# Patient Record
Sex: Female | Born: 1945 | Race: Black or African American | Hispanic: No | Marital: Married | Smoking: Former smoker
Health system: Southern US, Community
[De-identification: ages and names within clinical notes are randomized; demographics above are authoritative.]

## PROBLEM LIST (undated history)

## (undated) DIAGNOSIS — E785 Hyperlipidemia, unspecified: Secondary | ICD-10-CM

## (undated) DIAGNOSIS — N189 Chronic kidney disease, unspecified: Secondary | ICD-10-CM

## (undated) DIAGNOSIS — R09A2 Foreign body sensation, throat: Secondary | ICD-10-CM

## (undated) DIAGNOSIS — R0600 Dyspnea, unspecified: Secondary | ICD-10-CM

## (undated) DIAGNOSIS — L309 Dermatitis, unspecified: Secondary | ICD-10-CM

## (undated) DIAGNOSIS — K219 Gastro-esophageal reflux disease without esophagitis: Secondary | ICD-10-CM

## (undated) DIAGNOSIS — R0602 Shortness of breath: Secondary | ICD-10-CM

## (undated) DIAGNOSIS — D3610 Benign neoplasm of peripheral nerves and autonomic nervous system, unspecified: Secondary | ICD-10-CM

## (undated) DIAGNOSIS — Z780 Asymptomatic menopausal state: Secondary | ICD-10-CM

## (undated) DIAGNOSIS — M810 Age-related osteoporosis without current pathological fracture: Secondary | ICD-10-CM

## (undated) DIAGNOSIS — I7300 Raynaud's syndrome without gangrene: Secondary | ICD-10-CM

## (undated) DIAGNOSIS — J45909 Unspecified asthma, uncomplicated: Secondary | ICD-10-CM

## (undated) DIAGNOSIS — J189 Pneumonia, unspecified organism: Secondary | ICD-10-CM

## (undated) DIAGNOSIS — R198 Other specified symptoms and signs involving the digestive system and abdomen: Secondary | ICD-10-CM

## (undated) DIAGNOSIS — D649 Anemia, unspecified: Secondary | ICD-10-CM

## (undated) DIAGNOSIS — M1990 Unspecified osteoarthritis, unspecified site: Secondary | ICD-10-CM

## (undated) DIAGNOSIS — T7840XA Allergy, unspecified, initial encounter: Secondary | ICD-10-CM

## (undated) DIAGNOSIS — J301 Allergic rhinitis due to pollen: Secondary | ICD-10-CM

## (undated) DIAGNOSIS — I10 Essential (primary) hypertension: Secondary | ICD-10-CM

## (undated) DIAGNOSIS — R0989 Other specified symptoms and signs involving the circulatory and respiratory systems: Secondary | ICD-10-CM

## (undated) DIAGNOSIS — G5600 Carpal tunnel syndrome, unspecified upper limb: Secondary | ICD-10-CM

## (undated) HISTORY — DX: Essential (primary) hypertension: I10

## (undated) HISTORY — DX: Allergy, unspecified, initial encounter: T78.40XA

## (undated) HISTORY — DX: Gastro-esophageal reflux disease without esophagitis: K21.9

## (undated) HISTORY — DX: Allergic rhinitis due to pollen: J30.1

## (undated) HISTORY — DX: Carpal tunnel syndrome, unspecified upper limb: G56.00

## (undated) HISTORY — DX: Other specified symptoms and signs involving the digestive system and abdomen: R19.8

## (undated) HISTORY — DX: Benign neoplasm of peripheral nerves and autonomic nervous system, unspecified: D36.10

## (undated) HISTORY — DX: Dermatitis, unspecified: L30.9

## (undated) HISTORY — DX: Age-related osteoporosis without current pathological fracture: M81.0

## (undated) HISTORY — PX: FOOT NEUROMA SURGERY: SHX646

## (undated) HISTORY — DX: Raynaud's syndrome without gangrene: I73.00

## (undated) HISTORY — DX: Asymptomatic menopausal state: Z78.0

## (undated) HISTORY — DX: Unspecified asthma, uncomplicated: J45.909

## (undated) HISTORY — PX: PARTIAL HYSTERECTOMY: SHX80

## (undated) HISTORY — DX: Shortness of breath: R06.02

## (undated) HISTORY — DX: Foreign body sensation, throat: R09.A2

## (undated) HISTORY — DX: Other specified symptoms and signs involving the circulatory and respiratory systems: R09.89

## (undated) HISTORY — DX: Hyperlipidemia, unspecified: E78.5

---

## 2007-11-02 ENCOUNTER — Emergency Department: Payer: Self-pay | Admitting: Emergency Medicine

## 2007-11-15 ENCOUNTER — Ambulatory Visit: Payer: Self-pay | Admitting: Internal Medicine

## 2008-01-01 ENCOUNTER — Ambulatory Visit: Payer: Self-pay | Admitting: Gastroenterology

## 2008-01-01 LAB — HM COLONOSCOPY

## 2008-03-03 ENCOUNTER — Ambulatory Visit: Payer: Self-pay | Admitting: Internal Medicine

## 2008-03-04 ENCOUNTER — Ambulatory Visit: Payer: Self-pay | Admitting: Internal Medicine

## 2008-03-16 ENCOUNTER — Ambulatory Visit: Payer: Self-pay | Admitting: Internal Medicine

## 2011-12-27 DIAGNOSIS — J301 Allergic rhinitis due to pollen: Secondary | ICD-10-CM | POA: Diagnosis not present

## 2012-01-03 ENCOUNTER — Ambulatory Visit (INDEPENDENT_AMBULATORY_CARE_PROVIDER_SITE_OTHER): Payer: BC Managed Care – PPO | Admitting: Ophthalmology

## 2012-01-03 DIAGNOSIS — H43819 Vitreous degeneration, unspecified eye: Secondary | ICD-10-CM

## 2012-01-03 DIAGNOSIS — H35039 Hypertensive retinopathy, unspecified eye: Secondary | ICD-10-CM

## 2012-01-03 DIAGNOSIS — I10 Essential (primary) hypertension: Secondary | ICD-10-CM

## 2012-01-03 DIAGNOSIS — H348392 Tributary (branch) retinal vein occlusion, unspecified eye, stable: Secondary | ICD-10-CM

## 2012-01-10 DIAGNOSIS — H348392 Tributary (branch) retinal vein occlusion, unspecified eye, stable: Secondary | ICD-10-CM | POA: Diagnosis not present

## 2012-02-15 DIAGNOSIS — J301 Allergic rhinitis due to pollen: Secondary | ICD-10-CM | POA: Diagnosis not present

## 2012-02-29 DIAGNOSIS — J301 Allergic rhinitis due to pollen: Secondary | ICD-10-CM | POA: Diagnosis not present

## 2012-03-04 DIAGNOSIS — J301 Allergic rhinitis due to pollen: Secondary | ICD-10-CM | POA: Diagnosis not present

## 2012-03-07 DIAGNOSIS — J301 Allergic rhinitis due to pollen: Secondary | ICD-10-CM | POA: Diagnosis not present

## 2012-03-11 DIAGNOSIS — J301 Allergic rhinitis due to pollen: Secondary | ICD-10-CM | POA: Diagnosis not present

## 2012-03-14 DIAGNOSIS — J301 Allergic rhinitis due to pollen: Secondary | ICD-10-CM | POA: Diagnosis not present

## 2012-04-28 ENCOUNTER — Emergency Department: Payer: Self-pay | Admitting: Emergency Medicine

## 2012-04-28 DIAGNOSIS — S139XXA Sprain of joints and ligaments of unspecified parts of neck, initial encounter: Secondary | ICD-10-CM | POA: Diagnosis not present

## 2012-05-18 DIAGNOSIS — M545 Low back pain: Secondary | ICD-10-CM | POA: Diagnosis not present

## 2012-05-18 DIAGNOSIS — IMO0001 Reserved for inherently not codable concepts without codable children: Secondary | ICD-10-CM | POA: Diagnosis not present

## 2012-05-18 DIAGNOSIS — M2560 Stiffness of unspecified joint, not elsewhere classified: Secondary | ICD-10-CM | POA: Diagnosis not present

## 2012-05-20 DIAGNOSIS — M545 Low back pain: Secondary | ICD-10-CM | POA: Diagnosis not present

## 2012-05-20 DIAGNOSIS — IMO0001 Reserved for inherently not codable concepts without codable children: Secondary | ICD-10-CM | POA: Diagnosis not present

## 2012-05-20 DIAGNOSIS — M2560 Stiffness of unspecified joint, not elsewhere classified: Secondary | ICD-10-CM | POA: Diagnosis not present

## 2012-06-10 DIAGNOSIS — R002 Palpitations: Secondary | ICD-10-CM | POA: Diagnosis not present

## 2012-06-13 DIAGNOSIS — J301 Allergic rhinitis due to pollen: Secondary | ICD-10-CM | POA: Diagnosis not present

## 2012-06-18 DIAGNOSIS — E785 Hyperlipidemia, unspecified: Secondary | ICD-10-CM | POA: Diagnosis not present

## 2012-06-18 DIAGNOSIS — I10 Essential (primary) hypertension: Secondary | ICD-10-CM | POA: Diagnosis not present

## 2012-06-24 DIAGNOSIS — E2839 Other primary ovarian failure: Secondary | ICD-10-CM | POA: Diagnosis not present

## 2012-06-24 DIAGNOSIS — H2510 Age-related nuclear cataract, unspecified eye: Secondary | ICD-10-CM | POA: Diagnosis not present

## 2012-06-24 DIAGNOSIS — N951 Menopausal and female climacteric states: Secondary | ICD-10-CM | POA: Diagnosis not present

## 2012-07-01 DIAGNOSIS — R9431 Abnormal electrocardiogram [ECG] [EKG]: Secondary | ICD-10-CM | POA: Diagnosis not present

## 2012-07-01 DIAGNOSIS — R002 Palpitations: Secondary | ICD-10-CM | POA: Diagnosis not present

## 2012-07-01 DIAGNOSIS — E785 Hyperlipidemia, unspecified: Secondary | ICD-10-CM | POA: Diagnosis not present

## 2012-07-01 DIAGNOSIS — I10 Essential (primary) hypertension: Secondary | ICD-10-CM | POA: Diagnosis not present

## 2012-07-15 DIAGNOSIS — E782 Mixed hyperlipidemia: Secondary | ICD-10-CM | POA: Diagnosis not present

## 2012-07-15 DIAGNOSIS — R0602 Shortness of breath: Secondary | ICD-10-CM | POA: Diagnosis not present

## 2012-07-15 DIAGNOSIS — I119 Hypertensive heart disease without heart failure: Secondary | ICD-10-CM | POA: Diagnosis not present

## 2012-07-15 DIAGNOSIS — I209 Angina pectoris, unspecified: Secondary | ICD-10-CM | POA: Diagnosis not present

## 2012-07-24 DIAGNOSIS — I10 Essential (primary) hypertension: Secondary | ICD-10-CM | POA: Diagnosis not present

## 2012-07-24 DIAGNOSIS — E7800 Pure hypercholesterolemia, unspecified: Secondary | ICD-10-CM | POA: Diagnosis not present

## 2012-07-24 DIAGNOSIS — E785 Hyperlipidemia, unspecified: Secondary | ICD-10-CM | POA: Diagnosis not present

## 2012-07-24 DIAGNOSIS — J45909 Unspecified asthma, uncomplicated: Secondary | ICD-10-CM | POA: Diagnosis not present

## 2012-08-12 DIAGNOSIS — H2510 Age-related nuclear cataract, unspecified eye: Secondary | ICD-10-CM | POA: Diagnosis not present

## 2012-08-14 DIAGNOSIS — J301 Allergic rhinitis due to pollen: Secondary | ICD-10-CM | POA: Diagnosis not present

## 2012-09-05 DIAGNOSIS — J301 Allergic rhinitis due to pollen: Secondary | ICD-10-CM | POA: Diagnosis not present

## 2012-09-05 DIAGNOSIS — E785 Hyperlipidemia, unspecified: Secondary | ICD-10-CM | POA: Diagnosis not present

## 2012-09-05 DIAGNOSIS — I10 Essential (primary) hypertension: Secondary | ICD-10-CM | POA: Diagnosis not present

## 2012-09-05 DIAGNOSIS — E559 Vitamin D deficiency, unspecified: Secondary | ICD-10-CM | POA: Diagnosis not present

## 2012-09-05 DIAGNOSIS — Z0000 Encounter for general adult medical examination without abnormal findings: Secondary | ICD-10-CM | POA: Diagnosis not present

## 2012-09-10 DIAGNOSIS — Z23 Encounter for immunization: Secondary | ICD-10-CM | POA: Diagnosis not present

## 2012-09-10 DIAGNOSIS — Z0000 Encounter for general adult medical examination without abnormal findings: Secondary | ICD-10-CM | POA: Diagnosis not present

## 2012-09-10 DIAGNOSIS — J45909 Unspecified asthma, uncomplicated: Secondary | ICD-10-CM | POA: Diagnosis not present

## 2012-09-10 DIAGNOSIS — J309 Allergic rhinitis, unspecified: Secondary | ICD-10-CM | POA: Diagnosis not present

## 2012-10-07 DIAGNOSIS — H2510 Age-related nuclear cataract, unspecified eye: Secondary | ICD-10-CM | POA: Diagnosis not present

## 2012-10-07 DIAGNOSIS — H269 Unspecified cataract: Secondary | ICD-10-CM | POA: Diagnosis not present

## 2012-10-08 DIAGNOSIS — Z961 Presence of intraocular lens: Secondary | ICD-10-CM | POA: Diagnosis not present

## 2012-10-08 DIAGNOSIS — H2510 Age-related nuclear cataract, unspecified eye: Secondary | ICD-10-CM | POA: Diagnosis not present

## 2012-10-28 DIAGNOSIS — H2510 Age-related nuclear cataract, unspecified eye: Secondary | ICD-10-CM | POA: Diagnosis not present

## 2012-10-28 DIAGNOSIS — H269 Unspecified cataract: Secondary | ICD-10-CM | POA: Diagnosis not present

## 2012-12-24 DIAGNOSIS — I10 Essential (primary) hypertension: Secondary | ICD-10-CM | POA: Diagnosis not present

## 2012-12-24 DIAGNOSIS — E785 Hyperlipidemia, unspecified: Secondary | ICD-10-CM | POA: Diagnosis not present

## 2012-12-24 DIAGNOSIS — R002 Palpitations: Secondary | ICD-10-CM | POA: Diagnosis not present

## 2012-12-24 DIAGNOSIS — I059 Rheumatic mitral valve disease, unspecified: Secondary | ICD-10-CM | POA: Diagnosis not present

## 2012-12-25 ENCOUNTER — Encounter (INDEPENDENT_AMBULATORY_CARE_PROVIDER_SITE_OTHER): Payer: Medicare Other | Admitting: Ophthalmology

## 2012-12-25 DIAGNOSIS — H35039 Hypertensive retinopathy, unspecified eye: Secondary | ICD-10-CM | POA: Diagnosis not present

## 2012-12-25 DIAGNOSIS — H348392 Tributary (branch) retinal vein occlusion, unspecified eye, stable: Secondary | ICD-10-CM | POA: Diagnosis not present

## 2012-12-25 DIAGNOSIS — H43819 Vitreous degeneration, unspecified eye: Secondary | ICD-10-CM

## 2012-12-25 DIAGNOSIS — I10 Essential (primary) hypertension: Secondary | ICD-10-CM

## 2012-12-25 DIAGNOSIS — H35359 Cystoid macular degeneration, unspecified eye: Secondary | ICD-10-CM | POA: Diagnosis not present

## 2013-01-02 ENCOUNTER — Ambulatory Visit (INDEPENDENT_AMBULATORY_CARE_PROVIDER_SITE_OTHER): Payer: BC Managed Care – PPO | Admitting: Ophthalmology

## 2013-02-05 ENCOUNTER — Encounter (INDEPENDENT_AMBULATORY_CARE_PROVIDER_SITE_OTHER): Payer: Medicare Other | Admitting: Ophthalmology

## 2013-02-05 DIAGNOSIS — H2510 Age-related nuclear cataract, unspecified eye: Secondary | ICD-10-CM

## 2013-02-05 DIAGNOSIS — H43819 Vitreous degeneration, unspecified eye: Secondary | ICD-10-CM

## 2013-02-05 DIAGNOSIS — H348392 Tributary (branch) retinal vein occlusion, unspecified eye, stable: Secondary | ICD-10-CM

## 2013-02-05 DIAGNOSIS — I10 Essential (primary) hypertension: Secondary | ICD-10-CM

## 2013-02-05 DIAGNOSIS — H35039 Hypertensive retinopathy, unspecified eye: Secondary | ICD-10-CM

## 2013-08-05 ENCOUNTER — Ambulatory Visit (INDEPENDENT_AMBULATORY_CARE_PROVIDER_SITE_OTHER): Payer: Medicare Other | Admitting: Ophthalmology

## 2013-08-11 DIAGNOSIS — I10 Essential (primary) hypertension: Secondary | ICD-10-CM | POA: Diagnosis not present

## 2013-08-11 DIAGNOSIS — J019 Acute sinusitis, unspecified: Secondary | ICD-10-CM | POA: Diagnosis not present

## 2013-08-11 DIAGNOSIS — N951 Menopausal and female climacteric states: Secondary | ICD-10-CM | POA: Diagnosis not present

## 2013-08-27 DIAGNOSIS — E785 Hyperlipidemia, unspecified: Secondary | ICD-10-CM | POA: Diagnosis not present

## 2013-08-27 DIAGNOSIS — I10 Essential (primary) hypertension: Secondary | ICD-10-CM | POA: Diagnosis not present

## 2013-08-27 DIAGNOSIS — R9431 Abnormal electrocardiogram [ECG] [EKG]: Secondary | ICD-10-CM | POA: Diagnosis not present

## 2013-09-17 DIAGNOSIS — Z0000 Encounter for general adult medical examination without abnormal findings: Secondary | ICD-10-CM | POA: Diagnosis not present

## 2013-09-17 DIAGNOSIS — I10 Essential (primary) hypertension: Secondary | ICD-10-CM | POA: Diagnosis not present

## 2013-09-17 DIAGNOSIS — E785 Hyperlipidemia, unspecified: Secondary | ICD-10-CM | POA: Diagnosis not present

## 2013-09-23 DIAGNOSIS — I10 Essential (primary) hypertension: Secondary | ICD-10-CM | POA: Diagnosis not present

## 2013-09-23 DIAGNOSIS — N951 Menopausal and female climacteric states: Secondary | ICD-10-CM | POA: Diagnosis not present

## 2013-09-23 DIAGNOSIS — Z0000 Encounter for general adult medical examination without abnormal findings: Secondary | ICD-10-CM | POA: Diagnosis not present

## 2013-09-25 DIAGNOSIS — Z1231 Encounter for screening mammogram for malignant neoplasm of breast: Secondary | ICD-10-CM | POA: Diagnosis not present

## 2013-10-27 DIAGNOSIS — J45909 Unspecified asthma, uncomplicated: Secondary | ICD-10-CM | POA: Diagnosis not present

## 2013-10-27 DIAGNOSIS — I10 Essential (primary) hypertension: Secondary | ICD-10-CM | POA: Diagnosis not present

## 2013-10-27 DIAGNOSIS — R0602 Shortness of breath: Secondary | ICD-10-CM | POA: Diagnosis not present

## 2013-11-24 ENCOUNTER — Ambulatory Visit: Payer: Self-pay

## 2013-11-24 DIAGNOSIS — I10 Essential (primary) hypertension: Secondary | ICD-10-CM | POA: Diagnosis not present

## 2013-11-24 DIAGNOSIS — J984 Other disorders of lung: Secondary | ICD-10-CM | POA: Diagnosis not present

## 2013-11-24 DIAGNOSIS — J45909 Unspecified asthma, uncomplicated: Secondary | ICD-10-CM | POA: Diagnosis not present

## 2013-11-24 DIAGNOSIS — R911 Solitary pulmonary nodule: Secondary | ICD-10-CM | POA: Diagnosis not present

## 2013-11-24 DIAGNOSIS — R0602 Shortness of breath: Secondary | ICD-10-CM | POA: Diagnosis not present

## 2013-12-18 HISTORY — PX: EYE SURGERY: SHX253

## 2014-03-09 DIAGNOSIS — J45909 Unspecified asthma, uncomplicated: Secondary | ICD-10-CM | POA: Diagnosis not present

## 2014-03-09 DIAGNOSIS — E785 Hyperlipidemia, unspecified: Secondary | ICD-10-CM | POA: Diagnosis not present

## 2014-03-09 DIAGNOSIS — N951 Menopausal and female climacteric states: Secondary | ICD-10-CM | POA: Diagnosis not present

## 2014-03-09 DIAGNOSIS — J309 Allergic rhinitis, unspecified: Secondary | ICD-10-CM | POA: Diagnosis not present

## 2014-03-09 DIAGNOSIS — I10 Essential (primary) hypertension: Secondary | ICD-10-CM | POA: Diagnosis not present

## 2014-03-12 DIAGNOSIS — J301 Allergic rhinitis due to pollen: Secondary | ICD-10-CM | POA: Diagnosis not present

## 2014-03-12 DIAGNOSIS — J0100 Acute maxillary sinusitis, unspecified: Secondary | ICD-10-CM | POA: Diagnosis not present

## 2014-04-13 DIAGNOSIS — L258 Unspecified contact dermatitis due to other agents: Secondary | ICD-10-CM | POA: Diagnosis not present

## 2014-04-13 DIAGNOSIS — I10 Essential (primary) hypertension: Secondary | ICD-10-CM | POA: Diagnosis not present

## 2014-08-20 DIAGNOSIS — H348392 Tributary (branch) retinal vein occlusion, unspecified eye, stable: Secondary | ICD-10-CM | POA: Diagnosis not present

## 2014-09-01 DIAGNOSIS — I10 Essential (primary) hypertension: Secondary | ICD-10-CM | POA: Diagnosis not present

## 2014-09-01 DIAGNOSIS — R0602 Shortness of breath: Secondary | ICD-10-CM | POA: Diagnosis not present

## 2014-09-01 DIAGNOSIS — E782 Mixed hyperlipidemia: Secondary | ICD-10-CM | POA: Diagnosis not present

## 2014-09-01 DIAGNOSIS — R0681 Apnea, not elsewhere classified: Secondary | ICD-10-CM | POA: Insufficient documentation

## 2014-09-14 DIAGNOSIS — E782 Mixed hyperlipidemia: Secondary | ICD-10-CM | POA: Diagnosis not present

## 2014-09-14 DIAGNOSIS — R0602 Shortness of breath: Secondary | ICD-10-CM | POA: Diagnosis not present

## 2014-09-14 DIAGNOSIS — R002 Palpitations: Secondary | ICD-10-CM | POA: Diagnosis not present

## 2014-09-14 DIAGNOSIS — I10 Essential (primary) hypertension: Secondary | ICD-10-CM | POA: Diagnosis not present

## 2014-09-15 DIAGNOSIS — R109 Unspecified abdominal pain: Secondary | ICD-10-CM | POA: Diagnosis not present

## 2014-09-15 DIAGNOSIS — K219 Gastro-esophageal reflux disease without esophagitis: Secondary | ICD-10-CM | POA: Diagnosis not present

## 2014-09-15 DIAGNOSIS — Z23 Encounter for immunization: Secondary | ICD-10-CM | POA: Diagnosis not present

## 2014-10-16 DIAGNOSIS — R42 Dizziness and giddiness: Secondary | ICD-10-CM | POA: Diagnosis not present

## 2014-10-27 DIAGNOSIS — M7071 Other bursitis of hip, right hip: Secondary | ICD-10-CM | POA: Diagnosis not present

## 2014-10-27 DIAGNOSIS — I10 Essential (primary) hypertension: Secondary | ICD-10-CM | POA: Diagnosis not present

## 2014-10-27 DIAGNOSIS — K30 Functional dyspepsia: Secondary | ICD-10-CM | POA: Diagnosis not present

## 2014-10-27 DIAGNOSIS — E780 Pure hypercholesterolemia: Secondary | ICD-10-CM | POA: Diagnosis not present

## 2014-10-27 DIAGNOSIS — Z0000 Encounter for general adult medical examination without abnormal findings: Secondary | ICD-10-CM | POA: Diagnosis not present

## 2014-10-27 DIAGNOSIS — J45909 Unspecified asthma, uncomplicated: Secondary | ICD-10-CM | POA: Diagnosis not present

## 2014-10-27 LAB — HM PAP SMEAR

## 2014-10-28 DIAGNOSIS — M7071 Other bursitis of hip, right hip: Secondary | ICD-10-CM | POA: Diagnosis not present

## 2014-11-23 ENCOUNTER — Ambulatory Visit: Payer: Self-pay | Admitting: Gastroenterology

## 2014-11-23 DIAGNOSIS — Z1211 Encounter for screening for malignant neoplasm of colon: Secondary | ICD-10-CM | POA: Diagnosis not present

## 2014-11-23 DIAGNOSIS — K219 Gastro-esophageal reflux disease without esophagitis: Secondary | ICD-10-CM | POA: Diagnosis not present

## 2014-11-23 DIAGNOSIS — M25551 Pain in right hip: Secondary | ICD-10-CM | POA: Diagnosis not present

## 2014-11-23 DIAGNOSIS — Z87891 Personal history of nicotine dependence: Secondary | ICD-10-CM | POA: Diagnosis not present

## 2014-11-23 DIAGNOSIS — I10 Essential (primary) hypertension: Secondary | ICD-10-CM | POA: Diagnosis not present

## 2014-11-23 DIAGNOSIS — Z7982 Long term (current) use of aspirin: Secondary | ICD-10-CM | POA: Diagnosis not present

## 2014-11-23 DIAGNOSIS — Z79899 Other long term (current) drug therapy: Secondary | ICD-10-CM | POA: Diagnosis not present

## 2014-11-23 DIAGNOSIS — I7300 Raynaud's syndrome without gangrene: Secondary | ICD-10-CM | POA: Diagnosis not present

## 2014-11-23 DIAGNOSIS — M1990 Unspecified osteoarthritis, unspecified site: Secondary | ICD-10-CM | POA: Diagnosis not present

## 2014-11-23 DIAGNOSIS — E785 Hyperlipidemia, unspecified: Secondary | ICD-10-CM | POA: Diagnosis not present

## 2014-11-23 DIAGNOSIS — L309 Dermatitis, unspecified: Secondary | ICD-10-CM | POA: Diagnosis not present

## 2014-12-31 DIAGNOSIS — Z1231 Encounter for screening mammogram for malignant neoplasm of breast: Secondary | ICD-10-CM | POA: Diagnosis not present

## 2014-12-31 LAB — HM MAMMOGRAPHY

## 2015-01-22 DIAGNOSIS — M79675 Pain in left toe(s): Secondary | ICD-10-CM | POA: Diagnosis not present

## 2015-03-04 DIAGNOSIS — R1013 Epigastric pain: Secondary | ICD-10-CM | POA: Diagnosis not present

## 2015-03-26 ENCOUNTER — Ambulatory Visit
Admit: 2015-03-26 | Disposition: A | Discharge status: Home / Self Care | Payer: Self-pay | Attending: Gastroenterology | Admitting: Gastroenterology

## 2015-03-26 DIAGNOSIS — K219 Gastro-esophageal reflux disease without esophagitis: Secondary | ICD-10-CM | POA: Diagnosis not present

## 2015-03-26 DIAGNOSIS — I7300 Raynaud's syndrome without gangrene: Secondary | ICD-10-CM | POA: Diagnosis not present

## 2015-03-26 DIAGNOSIS — Z888 Allergy status to other drugs, medicaments and biological substances status: Secondary | ICD-10-CM | POA: Diagnosis not present

## 2015-03-26 DIAGNOSIS — Z91018 Allergy to other foods: Secondary | ICD-10-CM | POA: Diagnosis not present

## 2015-03-26 DIAGNOSIS — K2950 Unspecified chronic gastritis without bleeding: Secondary | ICD-10-CM | POA: Diagnosis not present

## 2015-03-26 DIAGNOSIS — R05 Cough: Secondary | ICD-10-CM | POA: Diagnosis not present

## 2015-03-26 DIAGNOSIS — E785 Hyperlipidemia, unspecified: Secondary | ICD-10-CM | POA: Diagnosis not present

## 2015-03-26 DIAGNOSIS — I10 Essential (primary) hypertension: Secondary | ICD-10-CM | POA: Diagnosis not present

## 2015-03-26 DIAGNOSIS — G5600 Carpal tunnel syndrome, unspecified upper limb: Secondary | ICD-10-CM | POA: Diagnosis not present

## 2015-03-26 DIAGNOSIS — R1013 Epigastric pain: Secondary | ICD-10-CM | POA: Diagnosis not present

## 2015-03-26 DIAGNOSIS — Z91048 Other nonmedicinal substance allergy status: Secondary | ICD-10-CM | POA: Diagnosis not present

## 2015-03-26 DIAGNOSIS — Z7982 Long term (current) use of aspirin: Secondary | ICD-10-CM | POA: Diagnosis not present

## 2015-03-26 DIAGNOSIS — K2970 Gastritis, unspecified, without bleeding: Secondary | ICD-10-CM | POA: Diagnosis not present

## 2015-03-26 DIAGNOSIS — R12 Heartburn: Secondary | ICD-10-CM | POA: Diagnosis not present

## 2015-03-26 DIAGNOSIS — L309 Dermatitis, unspecified: Secondary | ICD-10-CM | POA: Diagnosis not present

## 2015-03-26 DIAGNOSIS — M8580 Other specified disorders of bone density and structure, unspecified site: Secondary | ICD-10-CM | POA: Diagnosis not present

## 2015-03-26 DIAGNOSIS — J45909 Unspecified asthma, uncomplicated: Secondary | ICD-10-CM | POA: Diagnosis not present

## 2015-03-26 DIAGNOSIS — Z7951 Long term (current) use of inhaled steroids: Secondary | ICD-10-CM | POA: Diagnosis not present

## 2015-03-26 DIAGNOSIS — Z79899 Other long term (current) drug therapy: Secondary | ICD-10-CM | POA: Diagnosis not present

## 2015-03-26 DIAGNOSIS — Z87891 Personal history of nicotine dependence: Secondary | ICD-10-CM | POA: Diagnosis not present

## 2015-03-26 DIAGNOSIS — M25551 Pain in right hip: Secondary | ICD-10-CM | POA: Diagnosis not present

## 2015-04-08 DIAGNOSIS — K30 Functional dyspepsia: Secondary | ICD-10-CM | POA: Diagnosis not present

## 2015-04-20 DIAGNOSIS — R002 Palpitations: Secondary | ICD-10-CM | POA: Diagnosis not present

## 2015-04-20 DIAGNOSIS — E782 Mixed hyperlipidemia: Secondary | ICD-10-CM | POA: Diagnosis not present

## 2015-04-20 DIAGNOSIS — I10 Essential (primary) hypertension: Secondary | ICD-10-CM | POA: Diagnosis not present

## 2015-04-21 DIAGNOSIS — R1310 Dysphagia, unspecified: Secondary | ICD-10-CM | POA: Diagnosis not present

## 2015-04-21 DIAGNOSIS — J387 Other diseases of larynx: Secondary | ICD-10-CM | POA: Diagnosis not present

## 2015-04-21 DIAGNOSIS — J301 Allergic rhinitis due to pollen: Secondary | ICD-10-CM | POA: Diagnosis not present

## 2015-04-21 DIAGNOSIS — R0602 Shortness of breath: Secondary | ICD-10-CM | POA: Diagnosis not present

## 2015-04-21 DIAGNOSIS — R05 Cough: Secondary | ICD-10-CM | POA: Diagnosis not present

## 2015-04-27 DIAGNOSIS — J301 Allergic rhinitis due to pollen: Secondary | ICD-10-CM | POA: Diagnosis not present

## 2015-05-14 DIAGNOSIS — J301 Allergic rhinitis due to pollen: Secondary | ICD-10-CM | POA: Diagnosis not present

## 2015-05-20 DIAGNOSIS — J301 Allergic rhinitis due to pollen: Secondary | ICD-10-CM | POA: Diagnosis not present

## 2015-05-24 ENCOUNTER — Institutional Professional Consult (permissible substitution): Payer: Self-pay | Admitting: Internal Medicine

## 2015-05-24 DIAGNOSIS — J301 Allergic rhinitis due to pollen: Secondary | ICD-10-CM | POA: Diagnosis not present

## 2015-05-27 DIAGNOSIS — J301 Allergic rhinitis due to pollen: Secondary | ICD-10-CM | POA: Diagnosis not present

## 2015-05-31 DIAGNOSIS — J301 Allergic rhinitis due to pollen: Secondary | ICD-10-CM | POA: Diagnosis not present

## 2015-06-03 DIAGNOSIS — J301 Allergic rhinitis due to pollen: Secondary | ICD-10-CM | POA: Diagnosis not present

## 2015-06-04 DIAGNOSIS — J301 Allergic rhinitis due to pollen: Secondary | ICD-10-CM | POA: Diagnosis not present

## 2015-06-07 ENCOUNTER — Other Ambulatory Visit: Payer: Self-pay

## 2015-06-07 ENCOUNTER — Telehealth: Payer: Self-pay

## 2015-06-07 DIAGNOSIS — A048 Other specified bacterial intestinal infections: Secondary | ICD-10-CM

## 2015-06-07 DIAGNOSIS — J301 Allergic rhinitis due to pollen: Secondary | ICD-10-CM | POA: Diagnosis not present

## 2015-06-07 NOTE — Telephone Encounter (Signed)
Called pt and advised I have mailed her a lab order to be retested for H. Pylori. Pt completed 10 day course of antibiotics.

## 2015-06-07 NOTE — Telephone Encounter (Signed)
-----   Message from Otilio Jefferson sent at 06/07/2015 10:59 AM EDT ----- Regarding: Re: Result Follow-up Sent: 06/02/2015  Call pt to remind its time to repeat H pylori test. GF  > From: Caelum Federici > To: Tanny Harnack > Sent: 04/20/2015 4:13 PM > Pt notified of results. Rx faxed to [facility]. GF  > From: [name] MD > To: Kristi Norment > Sent: 04/12/2015 10:25 AM > Let the pt know the biopsies were positive for H. pylori. The patient needs Pylera for 10 days if no allergies to the contents. Check stool H. pylori in 6 weeks after treatment.

## 2015-06-10 ENCOUNTER — Encounter: Payer: Self-pay | Admitting: Internal Medicine

## 2015-06-10 ENCOUNTER — Institutional Professional Consult (permissible substitution): Payer: Medicare Other | Admitting: Internal Medicine

## 2015-06-10 DIAGNOSIS — J301 Allergic rhinitis due to pollen: Secondary | ICD-10-CM | POA: Diagnosis not present

## 2015-06-14 ENCOUNTER — Ambulatory Visit (INDEPENDENT_AMBULATORY_CARE_PROVIDER_SITE_OTHER): Payer: Medicare Other | Admitting: Internal Medicine

## 2015-06-14 ENCOUNTER — Encounter: Payer: Self-pay | Admitting: Internal Medicine

## 2015-06-14 VITALS — BP 110/66 | HR 90 | Temp 98.4°F | Ht 62.5 in | Wt 148.0 lb

## 2015-06-14 DIAGNOSIS — J4530 Mild persistent asthma, uncomplicated: Secondary | ICD-10-CM

## 2015-06-14 DIAGNOSIS — J301 Allergic rhinitis due to pollen: Secondary | ICD-10-CM | POA: Diagnosis not present

## 2015-06-14 DIAGNOSIS — K219 Gastro-esophageal reflux disease without esophagitis: Secondary | ICD-10-CM | POA: Diagnosis not present

## 2015-06-14 DIAGNOSIS — R0609 Other forms of dyspnea: Secondary | ICD-10-CM | POA: Diagnosis not present

## 2015-06-14 DIAGNOSIS — R05 Cough: Secondary | ICD-10-CM | POA: Diagnosis not present

## 2015-06-14 DIAGNOSIS — R059 Cough, unspecified: Secondary | ICD-10-CM

## 2015-06-14 NOTE — Assessment & Plan Note (Addendum)
Patient clinically does have symptoms of extrinsic asthma, elicit worse with triggers such as perfumes, pollen, dust. Continue current allergy shots and allergy regiment area Will plan for pulmonary function testing, and 6 minute walk testing. Continue broncho-dilator/beta agonist combination therapy at this time. Advair 500/50. Review of imaging showed patient had a chest x-ray in 2014, there is a questionable artifact versus density in the right middle lobe. Will repeat 2 view chest x-ray, if density still there will consider further evaluation with CT scan.

## 2015-06-14 NOTE — Progress Notes (Signed)
Date: 06/14/2015  MRN# [id number] Nancy Mack [birth year]  Referring Physician: ENT - Dr. Richardson Landry PMD - Dr. Dellie Catholic Nancy Mack is a 69 y.o. old female seen in consultation for cough and dyspnea on exertion.   CC:  Chief Complaint  Patient presents with  . Advice Only    Pt referred by ENT Dr. Richardson Landry: Pt c/o sob, chest tightness, wheezing and cough with mucus ranging from clear thick to thin. This has been going on for several years.    HPI:  Patient is a pleasant 69 year old female presenting today for evaluation of shortness of breath along with chronic cough. Patient was referred from ENT. Patient states her cough has been going on for the past 5-6 years worse at night associated with heat and mostly in the summer with intermittent periods of clear sputum. However, this is becoming worse in the past year especially with laying flat. Cough is described a cyclical can last 1-2 minutes, about 10-15 times per day. Again cough is associated with shortness of breath and also dyspnea on exertion, especially climbing 1 flight of stairs. Other factors that may cough worse include inspiration, laying flat, perfumes, singing at times. Patient is currently being followed ENT for allergies, for the past 6 years, receiving allergy shots 2 times per week. Over the past month she is to use her albuterol inhaler about 3-4 times per month. Social history significant for about 7 years of tobacco use, 2-36 per day during that time. Patient also had significant secondhand smoke exposure from her husband for the past 37 years. No pets. Previously worked in indoor, doing office work. Off note, patient has had significant GI workup with upper endoscopy with biopsy showing possible positive H. pylori, she was placed on triple anabiotic therapy along with PPI for the past 2 weeks, currently she is only on a PPI at night. She also had a cardiology workup in 2015, for palpitations, she had a negative  stress test and a negative echo. Patient previously lived in [place] in the mid to late 80s and during that time started having shortness of breath was diagnosed with asthma. Since following with ENT she is also been placed on Advair, initially started off at low dose and gradually increased to max dose.  Patient states she does get relief of her shortness of breath and coughing at times with Advair.   PMHX:   Past Medical History  Diagnosis Date  . Asthma   . Rhinitis due to pollen   . Laryngopharyngeal reflux   . Globus pharyngeus   . SOB (shortness of breath)    Surgical Hx:  Past Surgical History  Procedure Laterality Date  . Partial hysterectomy     Family Hx:  Family History  Problem Relation Age of Onset  . Arthritis Mother   . Heart failure Mother   . Hypertension Mother   . Asthma Father   . Aneurysm Sister    Social Hx:   History  Substance Use Topics  . Smoking status: Former Smoker -- 2.00 packs/day for 2 years    Types: Cigarettes  . Smokeless tobacco: Never Used  . Alcohol Use: 0.0 oz/week    0 Standard drinks or equivalent per week     Comment: < 1 DRINK PER DAY   Medication:   Current Outpatient Rx  Name  Route  Sig  Dispense  Refill  . aspirin 81 MG tablet   Oral   Take 81 mg by mouth daily.         Marland Kitchen  atorvastatin (LIPITOR) 10 MG tablet   Oral   Take 10 mg by mouth daily.         Marland Kitchen estradiol (ESTRACE) 0.5 MG tablet   Oral   Take 0.5 mg by mouth daily.         . fexofenadine (ALLEGRA) 180 MG tablet   Oral   Take 180 mg by mouth daily.         . fluticasone (FLONASE) 50 MCG/ACT nasal spray   Each Nare   Place 2 sprays into both nostrils daily.         . Fluticasone-Salmeterol (ADVAIR) 500-50 MCG/DOSE AEPB   Inhalation   Inhale 1 puff into the lungs 2 (two) times daily.         . hydrochlorothiazide (HYDRODIURIL) 12.5 MG tablet   Oral   Take 12.5 mg by mouth daily.         . montelukast (SINGULAIR) 10 MG tablet    Oral   Take 10 mg by mouth at bedtime.             Allergies:  Prednisone  Review of Systems: Gen:  Denies  fever, sweats, chills HEENT: Denies blurred vision, double vision, ear pain, eye pain, hearing loss, nose bleeds, sore throat Cvc:  No dizziness, chest pain or heaviness Resp:   Cough, acid reflux, shortness of breath, dyspnea on exertion Gi: Denies swallowing difficulty, stomach pain, nausea or vomiting, diarrhea, constipation, bowel incontinence Gu:  Denies bladder incontinence, burning urine Ext:   No Joint pain, stiffness or swelling Skin: No skin rash, easy bruising or bleeding or hives Endoc:  No polyuria, polydipsia , polyphagia or weight change Psych: No depression, insomnia or hallucinations  Other:  All other systems negative  Physical Examination:   VS: BP 110/66 mmHg  Pulse 90  Temp(Src) 98.4 F (36.9 C) (Oral)  Ht 5' 2.5" (1.588 m)  Wt 148 lb (67.132 kg)  BMI 26.62 kg/m2  SpO2 95%  General Appearance: No distress  Neuro:without focal findings, mental status, speech normal, alert and oriented, cranial nerves 2-12 intact, reflexes normal and symmetric, sensation grossly normal  HEENT: PERRLA, EOM intact, no ptosis, no other lesions noticed; Mallampati 1 Pulmonary: Good inspiratory effort, slight expiratory wheeze, noted on deep inspiration to elicit cough. CardiovascularNormal S1,S2.  No m/r/g.  Abdominal aorta pulsation normal.    Abdomen: Benign, Soft, non-tender, No masses, hepatosplenomegaly, No lymphadenopathy Renal:  No costovertebral tenderness  GU:  No performed at this time. Endoc: No evident thyromegaly, no signs of acromegaly or Cushing features Skin:   warm, no rashes, no ecchymosis  Extremities: normal, no cyanosis, clubbing, no edema, warm with normal capillary refill. Other findings: None   Labs results:   Rad results: (The following images and results were reviewed by Dr. Stevenson Clinch). CXR 2014 EXAM:  [id number] UN 10/27/13   11:05:[id number] [facility]) : XR CHEST PA AND LATERAL  DICTATED: 11/06/13 15:23:51 INTERPRETATION LOCATION:  [place]  CLINICAL INDICATION: 69 Year Old (F) with history of: 786.05 - Shortness of breath.  TECHNIQUE: Two view chest x-ray   COMPARISON: None   FINDINGS: The trachea and cardiomediastinal silhouette are unremarkable.  No pleural effusion, focal consolidation or pneumothorax identified. A rounded density overlying the right hilar region may represent summation of shadows, although it is  questionably present on the lateral projection. The possibility of an underlying mass is not excluded. Lungs are otherwise clear. The osseous structures appear intact.     IMPRESSION:  Rounded density in  the right midlung may be artifactual in nature, however the possibility of an underlying mass is not excluded. Recommend comparison with prior outside imaging if available. If patient is high risk, consider cross-sectional imaging for  further evaluation.     Stress Test 09/14/14 [facility] - Dr. Nehemiah Massed INTERPRETATION Normal Stress Echocardiogram NORMAL STRESS ECHOCARDIOGRAM    Assessment and Plan: 69 year old female seen in consultation for dyspnea on exertion, and chronic cough. Extrinsic asthma Patient clinically does have symptoms of extrinsic asthma, elicit worse with triggers such as perfumes, pollen, dust. Continue current allergy shots and allergy regiment area Will plan for pulmonary function testing, and 6 minute walk testing. Continue broncho-dilator/beta agonist combination therapy at this time. Advair 500/50. Review of imaging showed patient had a chest x-ray in 2014, there is a questionable artifact versus density in the right middle lobe. Will repeat 2 view chest x-ray, if density still there will consider further evaluation with CT scan.  Cough The standardized cough guidelines published in Chest by Lissa Morales in 2006 are still the best available and  consist of a multiple step process (up to 12!) , not a single office visit,  and are intended  to address this problem logically,  with an alogrithm dependent on response to empiric treatment at  each progressive step  to determine a specific diagnosis with  minimal addtional testing needed. Therefore if adherence is an issue or can't be accurately verified,  it's very unlikely the standard evaluation and treatment will be successful here.    Furthermore, response to therapy (other than acute cough suppression, which should only be used short term with avoidance of narcotic containing cough syrups if possible), can be a gradual process for which the patient may perceive immediate benefit.   At this time believe patient's cough is multifactorial: Acid reflux disease, asthma, allergies.  Plan: -Optimize allergy regiment, continue with allergy shots, nasal steroids, allergy pills. -We'll further evaluate asthma with primary function testing. Continue with Advair 500/50 -Continue with GI evaluation and workup for acid reflux disease, continue with PPI.  GERD (gastroesophageal reflux disease) Per patient, had upper endoscopy and biopsy showing positive H. pylori. Patient completed Triple Antibiotic therapy along with PPI therapy. Currently only requiring PPI therapy, GI is following and we'll further evaluate and check for clearance with stool sample.    Updated Medication List Outpatient Encounter Prescriptions as of 06/14/2015  Medication Sig  . aspirin 81 MG tablet Take 81 mg by mouth daily.  Marland Kitchen atorvastatin (LIPITOR) 10 MG tablet Take 10 mg by mouth daily.  Marland Kitchen estradiol (ESTRACE) 0.5 MG tablet Take 0.5 mg by mouth daily.  . fexofenadine (ALLEGRA) 180 MG tablet Take 180 mg by mouth daily.  . fluticasone (FLONASE) 50 MCG/ACT nasal spray Place 2 sprays into both nostrils daily.  . Fluticasone-Salmeterol (ADVAIR) 500-50 MCG/DOSE AEPB Inhale 1 puff into the lungs 2 (two) times daily.  .  hydrochlorothiazide (HYDRODIURIL) 12.5 MG tablet Take 12.5 mg by mouth daily.  . montelukast (SINGULAIR) 10 MG tablet Take 10 mg by mouth at bedtime.  . [DISCONTINUED] Transdermal Patch PTCH Apply 1 patch topically daily as needed.   No facility-administered encounter medications on file as of 06/14/2015.    Orders for this visit: Orders Placed This Encounter  Procedures  . DG Chest 2 View    Standing Status: Future     Number of Occurrences:      Standing Expiration Date: 08/13/2016    Order Specific Question:  Reason for Exam (  SYMPTOM  OR DIAGNOSIS REQUIRED)    Answer:  cough, asthma, dyspnea    Order Specific Question:  Preferred imaging location?    Answer:  [facility]  . Pulmonary function test    Standing Status: Future     Number of Occurrences:      Standing Expiration Date: 06/13/2016    Scheduling Instructions:     [place]    Order Specific Question:  Where should this test be performed?    Answer:  [facility]    Order Specific Question:  Full PFT: includes the following: basic spirometry, spirometry pre & post bronchodilator, diffusion capacity (DLCO), lung volumes    Answer:  Full PFT    Order Specific Question:  MIP/MEP    Answer:  No    Order Specific Question:  6 minute walk    Answer:  Yes    Order Specific Question:  ABG    Answer:  No    Order Specific Question:  Diffusion capacity (DLCO)    Answer:  No    Order Specific Question:  Lung volumes    Answer:  No    Order Specific Question:  Methacholine challenge    Answer:  No     Thank  you for the consultation and for allowing [facility] to assist in the care of your patient. Our recommendations are noted above.  Please contact us if we can be of further service.   Vilinda Boehringer, MD [facility] Office Number: [phone]

## 2015-06-14 NOTE — Patient Instructions (Addendum)
Follow up with Dr. Stevenson Clinch in 6 weeks - pulmonary function testing and 6 minute walk test prior to follow up - 2 view chest xray - albuterol inhaler - 2puff every 3-4 hours as needed for shortness of breath\wheezing\recurrent cough - cont with Advair - continue with allergy shots, flonas, singulair - cont with your acid reflux pills.

## 2015-06-14 NOTE — Assessment & Plan Note (Signed)
Per patient, had upper endoscopy and biopsy showing positive H. pylori. Patient completed Triple Antibiotic therapy along with PPI therapy. Currently only requiring PPI therapy, GI is following and we'll further evaluate and check for clearance with stool sample.

## 2015-06-14 NOTE — Assessment & Plan Note (Signed)
The standardized cough guidelines published in Chest by Lissa Morales in 2006 are still the best available and consist of a multiple step process (up to 12!) , not a single office visit,  and are intended  to address this problem logically,  with an alogrithm dependent on response to empiric treatment at  each progressive step  to determine a specific diagnosis with  minimal addtional testing needed. Therefore if adherence is an issue or can't be accurately verified,  it's very unlikely the standard evaluation and treatment will be successful here.    Furthermore, response to therapy (other than acute cough suppression, which should only be used short term with avoidance of narcotic containing cough syrups if possible), can be a gradual process for which the patient may perceive immediate benefit.   At this time believe patient's cough is multifactorial: Acid reflux disease, asthma, allergies.  Plan: -Optimize allergy regiment, continue with allergy shots, nasal steroids, allergy pills. -We'll further evaluate asthma with primary function testing. Continue with Advair 500/50 -Continue with GI evaluation and workup for acid reflux disease, continue with PPI.

## 2015-06-15 ENCOUNTER — Telehealth: Payer: Self-pay | Admitting: Gastroenterology

## 2015-06-15 NOTE — Telephone Encounter (Signed)
Pharmacy didn't get RX please send the stool sample kit RX to [phone]

## 2015-06-16 ENCOUNTER — Other Ambulatory Visit: Payer: Self-pay

## 2015-06-16 DIAGNOSIS — A048 Other specified bacterial intestinal infections: Secondary | ICD-10-CM

## 2015-06-16 MED ORDER — PYLERA 140-125-125 MG PO CAPS: 3.0000 | ORAL_CAPSULE | Freq: Three times a day (TID) | ORAL | Status: DC

## 2015-06-16 NOTE — Telephone Encounter (Signed)
Refaxed rx for pylera to [facility].

## 2015-06-17 ENCOUNTER — Telehealth: Payer: Self-pay | Admitting: Gastroenterology

## 2015-06-17 DIAGNOSIS — J301 Allergic rhinitis due to pollen: Secondary | ICD-10-CM | POA: Diagnosis not present

## 2015-06-17 NOTE — Telephone Encounter (Signed)
Pt lost RX for stool sample. Please call in another to [facility] [phone]

## 2015-06-18 NOTE — Telephone Encounter (Signed)
Rx for Pylera was resent to [facility]. Original Rx was sent in early May. Pt never picked up.

## 2015-06-24 DIAGNOSIS — J301 Allergic rhinitis due to pollen: Secondary | ICD-10-CM | POA: Diagnosis not present

## 2015-06-25 DIAGNOSIS — J301 Allergic rhinitis due to pollen: Secondary | ICD-10-CM | POA: Diagnosis not present

## 2015-06-28 DIAGNOSIS — J301 Allergic rhinitis due to pollen: Secondary | ICD-10-CM | POA: Diagnosis not present

## 2015-07-01 DIAGNOSIS — J301 Allergic rhinitis due to pollen: Secondary | ICD-10-CM | POA: Diagnosis not present

## 2015-07-05 DIAGNOSIS — J301 Allergic rhinitis due to pollen: Secondary | ICD-10-CM | POA: Diagnosis not present

## 2015-07-08 ENCOUNTER — Ambulatory Visit
Admission: RE | Admit: 2015-07-08 | Discharge: 2015-07-08 | Disposition: A | Discharge status: Home / Self Care | Payer: Medicare Other | Source: Ambulatory Visit | Attending: Internal Medicine | Admitting: Internal Medicine

## 2015-07-08 DIAGNOSIS — J45909 Unspecified asthma, uncomplicated: Secondary | ICD-10-CM | POA: Diagnosis not present

## 2015-07-08 DIAGNOSIS — J4530 Mild persistent asthma, uncomplicated: Secondary | ICD-10-CM

## 2015-07-08 DIAGNOSIS — J449 Chronic obstructive pulmonary disease, unspecified: Secondary | ICD-10-CM | POA: Insufficient documentation

## 2015-07-08 DIAGNOSIS — J301 Allergic rhinitis due to pollen: Secondary | ICD-10-CM | POA: Diagnosis not present

## 2015-07-08 DIAGNOSIS — R05 Cough: Secondary | ICD-10-CM

## 2015-07-08 DIAGNOSIS — R0609 Other forms of dyspnea: Secondary | ICD-10-CM

## 2015-07-08 DIAGNOSIS — R059 Cough, unspecified: Secondary | ICD-10-CM

## 2015-07-08 DIAGNOSIS — R0600 Dyspnea, unspecified: Secondary | ICD-10-CM | POA: Diagnosis present

## 2015-07-09 ENCOUNTER — Telehealth: Payer: Self-pay | Admitting: *Deleted

## 2015-07-09 NOTE — Telephone Encounter (Signed)
LMTCB X1 

## 2015-07-09 NOTE — Telephone Encounter (Signed)
-----   Message from Vilinda Boehringer, MD sent at 07/08/2015  3:08 PM EDT ----- Regarding: cxr results Please informed patient that her chest x-ray looks normal except for some findings consistent with COPD, that will be discussed with her at follow-up visit.  Thank you

## 2015-07-09 NOTE — Telephone Encounter (Signed)
Pt r/t call and was given results of cxr.

## 2015-07-12 DIAGNOSIS — J301 Allergic rhinitis due to pollen: Secondary | ICD-10-CM | POA: Diagnosis not present

## 2015-07-14 ENCOUNTER — Ambulatory Visit: Payer: Self-pay | Admitting: Internal Medicine

## 2015-07-14 ENCOUNTER — Ambulatory Visit: Payer: Self-pay

## 2015-07-15 DIAGNOSIS — J301 Allergic rhinitis due to pollen: Secondary | ICD-10-CM | POA: Diagnosis not present

## 2015-07-16 DIAGNOSIS — J301 Allergic rhinitis due to pollen: Secondary | ICD-10-CM | POA: Diagnosis not present

## 2015-07-22 DIAGNOSIS — J301 Allergic rhinitis due to pollen: Secondary | ICD-10-CM | POA: Diagnosis not present

## 2015-07-25 ENCOUNTER — Other Ambulatory Visit: Payer: Self-pay | Admitting: Unknown Physician Specialty

## 2015-07-28 ENCOUNTER — Encounter: Payer: Self-pay | Admitting: Internal Medicine

## 2015-07-28 ENCOUNTER — Ambulatory Visit (INDEPENDENT_AMBULATORY_CARE_PROVIDER_SITE_OTHER): Payer: Medicare Other | Admitting: Internal Medicine

## 2015-07-28 VITALS — BP 118/84 | HR 80 | Ht 63.0 in | Wt 149.0 lb

## 2015-07-28 DIAGNOSIS — R0609 Other forms of dyspnea: Secondary | ICD-10-CM | POA: Diagnosis not present

## 2015-07-28 DIAGNOSIS — J4530 Mild persistent asthma, uncomplicated: Secondary | ICD-10-CM | POA: Diagnosis not present

## 2015-07-28 DIAGNOSIS — R05 Cough: Secondary | ICD-10-CM | POA: Diagnosis not present

## 2015-07-28 DIAGNOSIS — R0600 Dyspnea, unspecified: Secondary | ICD-10-CM

## 2015-07-28 DIAGNOSIS — R059 Cough, unspecified: Secondary | ICD-10-CM

## 2015-07-28 LAB — PULMONARY FUNCTION TEST
DL/VA % pred: 100 %
DL/VA: 4.69 ml/min/mmHg/L
DLCO UNC % PRED: 124 %
DLCO unc: 28.42 ml/min/mmHg
FEF 25-75 PRE: 1.91 L/s
FEF 25-75 Post: 1.83 L/sec
FEF2575-%Change-Post: -4 %
FEF2575-%Pred-Post: 109 %
FEF2575-%Pred-Pre: 114 %
FEV1-%CHANGE-POST: -1 %
FEV1-%PRED-POST: 77 %
FEV1-%Pred-Pre: 79 %
FEV1-Post: 1.38 L
FEV1-Pre: 1.41 L
FEV1FVC-%Change-Post: -1 %
FEV1FVC-%Pred-Pre: 110 %
FEV6-%Change-Post: 0 %
FEV6-%PRED-POST: 73 %
FEV6-%Pred-Pre: 73 %
FEV6-POST: 1.62 L
FEV6-PRE: 1.63 L
FEV6FVC-%PRED-PRE: 104 %
FEV6FVC-%Pred-Post: 104 %
FVC-%Change-Post: 0 %
FVC-%PRED-PRE: 70 %
FVC-%Pred-Post: 70 %
FVC-Post: 1.62 L
FVC-Pre: 1.63 L
POST FEV6/FVC RATIO: 100 %
PRE FEV6/FVC RATIO: 100 %
Post FEV1/FVC ratio: 85 %
Pre FEV1/FVC ratio: 86 %
RV % pred: 76 %
RV: 1.61 L
TLC % pred: 74 %
TLC: 3.63 L

## 2015-07-28 NOTE — Assessment & Plan Note (Signed)
The standardized cough guidelines published in Chest by Lissa Morales in 2006 are still the best available and consist of a multiple step process (up to 12!) , not a single office visit,  and are intended  to address this problem logically,  with an alogrithm dependent on response to empiric treatment at  each progressive step  to determine a specific diagnosis with  minimal addtional testing needed. Therefore if adherence is an issue or can't be accurately verified,  it's very unlikely the standard evaluation and treatment will be successful here.    Furthermore, response to therapy (other than acute cough suppression, which should only be used short term with avoidance of narcotic containing cough syrups if possible), can be a gradual process for which the patient may perceive immediate benefit.   At this time believe patient's cough is multifactorial: Acid reflux disease, asthma, allergies.  Plan: -Optimize allergy regiment, continue with allergy shots, nasal steroids, allergy pills. - Continue with Advair 500/50 -Continue with GI evaluation and workup for acid reflux disease, continue with PPI.

## 2015-07-28 NOTE — Assessment & Plan Note (Signed)
Patient clinically does have symptoms of extrinsic asthma, elicit worse with triggers such as perfumes, pollen, dust. Continue current allergy shots and allergy regiment area Pulmonary function testing are essentially normal Continue broncho-dilator/beta agonist combination therapy at this time. Advair 500/50. Repeat chest 6 are with some mild hyperinflation no significant flattening of the diaphragms, could be secondary to asthma/COPD. Given the context of the patient and clinical suspicion for extrinsic asthma think continuing with her current Advair regiment, diet, exercise, weight loss, would be beneficial to overall clinical improvement.

## 2015-07-28 NOTE — Patient Instructions (Signed)
Follow up with Dr. Stevenson Clinch in 3 months - cont with Advair and as needed rescue inhaler - cont with allergy control - avoid allergens and tobacco  - increase exercise - diet and weight loss.  - you have normal pulmonary function test and normal 6 minute talk test

## 2015-07-28 NOTE — Progress Notes (Signed)
MRN# [id number] Nancy Mack May 24, 1946   CC: Chief Complaint  Patient presents with  . Follow-up    SMW/PFT results; prod cough w/clear mucus; SOB when going up stairs;      Brief History: HPI 05/2015 Patient is a pleasant 69 year old female presenting today for evaluation of shortness of breath along with chronic cough. Patient was referred from ENT. Patient states her cough has been going on for the past 5-6 years worse at night associated with heat and mostly in the summer with intermittent periods of clear sputum. However, this is becoming worse in the past year especially with laying flat. Cough is described a cyclical can last 1-2 minutes, about 10-15 times per day. Again cough is associated with shortness of breath and also dyspnea on exertion, especially climbing 1 flight of stairs. Other factors that may cough worse include inspiration, laying flat, perfumes, singing at times. Patient is currently being followed ENT for allergies, for the past 6 years, receiving allergy shots 2 times per week. Over the past month she is to use her albuterol inhaler about 3-4 times per month. Social history significant for about 7 years of tobacco use, 2-36 per day during that time. Patient also had significant secondhand smoke exposure from her husband for the past 37 years. No pets. Previously worked in indoor, doing office work. Off note, patient has had significant GI workup with upper endoscopy with biopsy showing possible positive H. pylori, she was placed on triple anabiotic therapy along with PPI for the past 2 weeks, currently she is only on a PPI at night. She also had a cardiology workup in 2015, for palpitations, she had a negative stress test and a negative echo. Patient previously lived in [place] in the mid to late 80s and during that time started having shortness of breath was diagnosed with asthma. Since following with ENT she is also been placed on Advair, initially started off  at low dose and gradually increased to max dose. Patient states she does get relief of her shortness of breath and coughing at times with Advair. Plan: -Optimize allergy regiment, continue with allergy shots, nasal steroids, allergy pills. -We'll further evaluate asthma with pulmonary function testing. Continue with Advair 500/50 -Continue with GI evaluation and workup for acid reflux disease, continue with PPI. - 2 view CXR   Events since last clinic visit: Patient presents today for follow-up visit of shortness of breath and chronic cough. Patient states overall that her cough is unchanged, still mild, nonproductive. Patient states she is able to climb 1 flight of stairs but is dyspneic afterwards. She has seen GI since her last visit, patient had endoscopy, told that she had an infection, was treated with anabiotic therapy and PPI, currently awaiting to do stool sampling. Overall patient feels that she is slightly improve with her breathing and her cough. Review of her chart show she had a normal echocardiogram, that today's visit she had pulmonary function testing and 6 minute walk testing.     Medication:   Current Outpatient Rx  Name  Route  Sig  Dispense  Refill  . aspirin 81 MG tablet   Oral   Take 81 mg by mouth daily.         Marland Kitchen atorvastatin (LIPITOR) 10 MG tablet   Oral   Take 10 mg by mouth daily.         Marland Kitchen estradiol (ESTRACE) 0.5 MG tablet   Oral   Take 0.5 mg by mouth daily.         Marland Kitchen  fexofenadine (ALLEGRA) 180 MG tablet   Oral   Take 180 mg by mouth daily.         . fluticasone (FLONASE) 50 MCG/ACT nasal spray   Each Nare   Place 2 sprays into both nostrils daily.         . Fluticasone-Salmeterol (ADVAIR) 500-50 MCG/DOSE AEPB   Inhalation   Inhale 1 puff into the lungs 2 (two) times daily.         . hydrochlorothiazide (HYDRODIURIL) 12.5 MG tablet   Oral   Take 12.5 mg by mouth daily.         . montelukast (SINGULAIR) 10 MG tablet   Oral    Take 10 mg by mouth at bedtime.         Marland Kitchen PYLERA 140-125-125 MG per capsule   Oral   Take 3 capsules by mouth 4 (four) times daily -  before meals and at bedtime.   120 capsule   0     Dispense as written.      Review of Systems: Gen:  Denies  fever, sweats, chills HEENT: Denies blurred vision, double vision, ear pain, eye pain, hearing loss, nose bleeds, sore throat Cvc:  No dizziness, chest pain or heaviness Resp:   Admits to: Chronic dyspnea on exertion and mild cough Gi: Denies swallowing difficulty, stomach pain, nausea or vomiting, diarrhea, constipation, bowel incontinence Gu:  Denies bladder incontinence, burning urine Ext:   No Joint pain, stiffness or swelling Skin: No skin rash, easy bruising or bleeding or hives Endoc:  No polyuria, polydipsia , polyphagia or weight change Other:  All other systems negative  Allergies:  Prednisone  Physical Examination:  VS: BP 118/84 mmHg  Pulse 80  Ht 5\' 3"  (1.6 m)  Wt 149 lb (67.586 kg)  BMI 26.40 kg/m2  SpO2 99%  General Appearance: No distress  HEENT: PERRLA, no ptosis, no other lesions noticed Pulmonary:normal breath sounds., diaphragmatic excursion normal.No wheezing, No rales   Cardiovascular:  Normal S1,S2.  No m/r/g.     Abdomen:Exam: Benign, Soft, non-tender, No masses  Skin:   warm, no rashes, no ecchymosis  Extremities: normal, no cyanosis, clubbing, warm with normal capillary refill.      Rad results: (The following images and results were reviewed by Dr. Stevenson Clinch). 06/2015 CHEST 2 VIEW  COMPARISON: CT scan of the chest of November 22, 2013  FINDINGS: The lungs are well-expanded. The interstitial markings are coarse. There is no alveolar infiltrate or pulmonary mass. There is no pleural effusion. The heart and pulmonary vascularity are normal. The mediastinum is normal in width. There is mild multilevel degenerative disc space narrowing of the thoracic spine.  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 There is no alveolar pneumonia nor CHF.  ECHO 08/2014 INTERPRETATION Normal Stress Echocardiogram NORMAL STRESS ECHOCARDIOGRAM   Pulmonary function testing 07/28/2015 FVC 70% FEV1 79% FEV1/FVC 86% RV 76% TLC 74% DLCO uncorrected 124 Impression: No significant obstruction or restriction. No response to bronchodilation is. Normal DLCO. Normal flow loops. Essentially normal PFTs.    6 minute walk test: 396 m, no desaturations less than 88%.  Assessment and Plan: 69 year old female seen in follow-up visit for chronic cough, shortness of breath. Extrinsic asthma Patient clinically does have symptoms of extrinsic asthma, elicit worse with triggers such as perfumes, pollen, dust. Continue current allergy shots and allergy regiment area Pulmonary function testing are essentially normal Continue broncho-dilator/beta agonist combination therapy at this time. Advair 500/50. Repeat chest 6 are with  some mild hyperinflation no significant flattening of the diaphragms, could be secondary to asthma/COPD. Given the context of the patient and clinical suspicion for extrinsic asthma think continuing with her current Advair regiment, diet, exercise, weight loss, would be beneficial to overall clinical improvement.    Cough The standardized cough guidelines published in Chest by Lissa Morales in 2006 are still the best available and consist of a multiple step process (up to 12!) , not a single office visit,  and are intended  to address this problem logically,  with an alogrithm dependent on response to empiric treatment at  each progressive step  to determine a specific diagnosis with  minimal addtional testing needed. Therefore if adherence is an issue or can't be accurately verified,  it's very unlikely the standard evaluation and treatment will be successful here.    Furthermore, response to therapy (other than acute cough suppression, which should only be  used short term with avoidance of narcotic containing cough syrups if possible), can be a gradual process for which the patient may perceive immediate benefit.   At this time believe patient's cough is multifactorial: Acid reflux disease, asthma, allergies.  Plan: -Optimize allergy regiment, continue with allergy shots, nasal steroids, allergy pills. - Continue with Advair 500/50 -Continue with GI evaluation and workup for acid reflux disease, continue with PPI.    DOE (dyspnea on exertion) Multifactorial: Suspected asthma, deconditioning, advanced age.  Review of chart shows that she had a normal echocardiogram in September 2015, today she has normal PFTs and normal 6 minute walk test. With normal workup I suspect she is having deconditioning, if patient is still dyspneic at follow-up visit may need to consider cardiopulmonary exercise stress testing.    Updated Medication List Outpatient Encounter Prescriptions as of 07/28/2015  Medication Sig  . aspirin 81 MG tablet Take 81 mg by mouth daily.  Marland Kitchen atorvastatin (LIPITOR) 10 MG tablet Take 10 mg by mouth daily.  Marland Kitchen estradiol (ESTRACE) 0.5 MG tablet Take 0.5 mg by mouth daily.  . fexofenadine (ALLEGRA) 180 MG tablet Take 180 mg by mouth daily.  . fluticasone (FLONASE) 50 MCG/ACT nasal spray Place 2 sprays into both nostrils daily.  . Fluticasone-Salmeterol (ADVAIR) 500-50 MCG/DOSE AEPB Inhale 1 puff into the lungs 2 (two) times daily.  . hydrochlorothiazide (HYDRODIURIL) 12.5 MG tablet Take 12.5 mg by mouth daily.  . montelukast (SINGULAIR) 10 MG tablet Take 10 mg by mouth at bedtime.  Marland Kitchen PYLERA 140-125-125 MG per capsule Take 3 capsules by mouth 4 (four) times daily -  before meals and at bedtime.   No facility-administered encounter medications on file as of 07/28/2015.    Orders for this visit: No orders of the defined types were placed in this encounter.    Thank  you for the visitation and for allowing  [facility] to assist in the care of your patient. Our recommendations are noted above.  Please contact us if we can be of further service.  Vilinda Boehringer, MD [facility] Office Number: [phone]

## 2015-07-28 NOTE — Progress Notes (Signed)
PFT performed today. 

## 2015-07-28 NOTE — Assessment & Plan Note (Signed)
Multifactorial: Suspected asthma, deconditioning, advanced age.  Review of chart shows that she had a normal echocardiogram in September 2015, today she has normal PFTs and normal 6 minute walk test. With normal workup I suspect she is having deconditioning, if patient is still dyspneic at follow-up visit may need to consider cardiopulmonary exercise stress testing.

## 2015-07-28 NOTE — Progress Notes (Signed)
SMW performed today. 

## 2015-07-29 DIAGNOSIS — J301 Allergic rhinitis due to pollen: Secondary | ICD-10-CM | POA: Diagnosis not present

## 2015-08-02 DIAGNOSIS — J301 Allergic rhinitis due to pollen: Secondary | ICD-10-CM | POA: Diagnosis not present

## 2015-08-05 DIAGNOSIS — J301 Allergic rhinitis due to pollen: Secondary | ICD-10-CM | POA: Diagnosis not present

## 2015-08-09 DIAGNOSIS — J301 Allergic rhinitis due to pollen: Secondary | ICD-10-CM | POA: Diagnosis not present

## 2015-08-12 DIAGNOSIS — J301 Allergic rhinitis due to pollen: Secondary | ICD-10-CM | POA: Diagnosis not present

## 2015-08-13 DIAGNOSIS — J301 Allergic rhinitis due to pollen: Secondary | ICD-10-CM | POA: Diagnosis not present

## 2015-08-16 DIAGNOSIS — J301 Allergic rhinitis due to pollen: Secondary | ICD-10-CM | POA: Diagnosis not present

## 2015-08-19 DIAGNOSIS — J301 Allergic rhinitis due to pollen: Secondary | ICD-10-CM | POA: Diagnosis not present

## 2015-08-26 DIAGNOSIS — J301 Allergic rhinitis due to pollen: Secondary | ICD-10-CM | POA: Diagnosis not present

## 2015-08-30 DIAGNOSIS — J301 Allergic rhinitis due to pollen: Secondary | ICD-10-CM | POA: Diagnosis not present

## 2015-09-02 DIAGNOSIS — J301 Allergic rhinitis due to pollen: Secondary | ICD-10-CM | POA: Diagnosis not present

## 2015-09-06 DIAGNOSIS — J301 Allergic rhinitis due to pollen: Secondary | ICD-10-CM | POA: Diagnosis not present

## 2015-09-13 DIAGNOSIS — J301 Allergic rhinitis due to pollen: Secondary | ICD-10-CM | POA: Diagnosis not present

## 2015-09-20 DIAGNOSIS — J301 Allergic rhinitis due to pollen: Secondary | ICD-10-CM | POA: Diagnosis not present

## 2015-09-27 DIAGNOSIS — J301 Allergic rhinitis due to pollen: Secondary | ICD-10-CM | POA: Diagnosis not present

## 2015-10-01 ENCOUNTER — Ambulatory Visit: Payer: Medicare Other

## 2015-10-01 DIAGNOSIS — Z23 Encounter for immunization: Secondary | ICD-10-CM

## 2015-10-04 DIAGNOSIS — J301 Allergic rhinitis due to pollen: Secondary | ICD-10-CM | POA: Diagnosis not present

## 2015-10-11 DIAGNOSIS — J301 Allergic rhinitis due to pollen: Secondary | ICD-10-CM | POA: Diagnosis not present

## 2015-10-18 DIAGNOSIS — J301 Allergic rhinitis due to pollen: Secondary | ICD-10-CM | POA: Diagnosis not present

## 2015-10-25 DIAGNOSIS — J301 Allergic rhinitis due to pollen: Secondary | ICD-10-CM | POA: Diagnosis not present

## 2015-10-27 ENCOUNTER — Other Ambulatory Visit: Payer: Self-pay | Admitting: Unknown Physician Specialty

## 2015-10-27 DIAGNOSIS — M8580 Other specified disorders of bone density and structure, unspecified site: Secondary | ICD-10-CM

## 2015-10-27 DIAGNOSIS — R002 Palpitations: Secondary | ICD-10-CM | POA: Insufficient documentation

## 2015-10-27 DIAGNOSIS — L309 Dermatitis, unspecified: Secondary | ICD-10-CM | POA: Insufficient documentation

## 2015-10-27 DIAGNOSIS — G5600 Carpal tunnel syndrome, unspecified upper limb: Secondary | ICD-10-CM | POA: Insufficient documentation

## 2015-10-27 DIAGNOSIS — E785 Hyperlipidemia, unspecified: Secondary | ICD-10-CM

## 2015-10-27 DIAGNOSIS — Z780 Asymptomatic menopausal state: Secondary | ICD-10-CM

## 2015-10-27 DIAGNOSIS — J309 Allergic rhinitis, unspecified: Secondary | ICD-10-CM

## 2015-10-27 DIAGNOSIS — J45909 Unspecified asthma, uncomplicated: Secondary | ICD-10-CM | POA: Insufficient documentation

## 2015-10-27 DIAGNOSIS — I7300 Raynaud's syndrome without gangrene: Secondary | ICD-10-CM | POA: Insufficient documentation

## 2015-10-27 MED ORDER — HYDROCHLOROTHIAZIDE 25 MG PO TABS: 12.5000 mg | ORAL_TABLET | Freq: Every day | ORAL | Status: DC

## 2015-10-27 NOTE — Telephone Encounter (Signed)
Patient has upcoming appointment 11/05/15, practice partner number is [phone] and pharmacy is [facility].

## 2015-11-01 DIAGNOSIS — J301 Allergic rhinitis due to pollen: Secondary | ICD-10-CM | POA: Diagnosis not present

## 2015-11-05 ENCOUNTER — Ambulatory Visit (INDEPENDENT_AMBULATORY_CARE_PROVIDER_SITE_OTHER): Payer: Medicare Other | Admitting: Unknown Physician Specialty

## 2015-11-05 ENCOUNTER — Encounter: Payer: Self-pay | Admitting: Unknown Physician Specialty

## 2015-11-05 VITALS — BP 123/77 | HR 86 | Temp 98.9°F | Ht 62.1 in | Wt 154.6 lb

## 2015-11-05 DIAGNOSIS — Z0000 Encounter for general adult medical examination without abnormal findings: Secondary | ICD-10-CM

## 2015-11-05 DIAGNOSIS — J069 Acute upper respiratory infection, unspecified: Secondary | ICD-10-CM | POA: Diagnosis not present

## 2015-11-05 DIAGNOSIS — J309 Allergic rhinitis, unspecified: Secondary | ICD-10-CM

## 2015-11-05 DIAGNOSIS — I10 Essential (primary) hypertension: Secondary | ICD-10-CM | POA: Diagnosis not present

## 2015-11-05 DIAGNOSIS — Z0001 Encounter for general adult medical examination with abnormal findings: Secondary | ICD-10-CM

## 2015-11-05 DIAGNOSIS — J45909 Unspecified asthma, uncomplicated: Secondary | ICD-10-CM

## 2015-11-05 DIAGNOSIS — E785 Hyperlipidemia, unspecified: Secondary | ICD-10-CM

## 2015-11-05 DIAGNOSIS — J301 Allergic rhinitis due to pollen: Secondary | ICD-10-CM | POA: Diagnosis not present

## 2015-11-05 LAB — MICROALBUMIN, URINE WAIVED
Creatinine, Urine Waived: 50 mg/dL (ref 10–300)
Microalb, Ur Waived: 10 mg/L (ref 0–19)

## 2015-11-05 MED ORDER — HYDROCOD POLST-CPM POLST ER 10-8 MG/5ML PO SUER: 5.0000 mL | Freq: Two times a day (BID) | ORAL | Status: DC | PRN

## 2015-11-05 MED ORDER — FLUTICASONE PROPIONATE 50 MCG/ACT NA SUSP: 2.0000 | Freq: Every day | NASAL | Status: DC

## 2015-11-05 MED ORDER — ESTRADIOL 2 MG PO TABS: 2.0000 mg | ORAL_TABLET | Freq: Every day | ORAL | Status: DC

## 2015-11-05 MED ORDER — FEXOFENADINE HCL 180 MG PO TABS: 180.0000 mg | ORAL_TABLET | Freq: Every day | ORAL | Status: DC

## 2015-11-05 MED ORDER — ATORVASTATIN CALCIUM 20 MG PO TABS: 20.0000 mg | ORAL_TABLET | Freq: Every day | ORAL | Status: DC

## 2015-11-05 MED ORDER — ALBUTEROL SULFATE HFA 108 (90 BASE) MCG/ACT IN AERS
2.0000 | INHALATION_SPRAY | Freq: Four times a day (QID) | RESPIRATORY_TRACT | Status: DC | PRN
Start: 2015-11-05 — End: 2016-01-28

## 2015-11-05 MED ORDER — HYDROCHLOROTHIAZIDE 25 MG PO TABS: 12.5000 mg | ORAL_TABLET | Freq: Every day | ORAL | Status: DC

## 2015-11-05 MED ORDER — FLUTICASONE-SALMETEROL 500-50 MCG/DOSE IN AEPB: 1.0000 | INHALATION_SPRAY | Freq: Two times a day (BID) | RESPIRATORY_TRACT | Status: DC

## 2015-11-05 MED ORDER — MONTELUKAST SODIUM 10 MG PO TABS: 10.0000 mg | ORAL_TABLET | Freq: Every day | ORAL | Status: DC

## 2015-11-05 NOTE — Progress Notes (Signed)
BP 123/77 mmHg  Pulse 86  Temp(Src) 98.9 F (37.2 C)  Ht 5' 2.1" (1.577 m)  Wt 154 lb 9.6 oz (70.126 kg)  BMI 28.20 kg/m2  SpO2 96%  LMP 07/31/1981 (Approximate)   Subjective:    Patient ID: Nancy Mack, female    DOB: 28-May-1946, 69 y.o.   MRN: [id number]  HPI: Nancy Mack is a 69 y.o. female  Chief Complaint  Patient presents with  . Medicare Wellness   Functional Status Survey: Is the patient deaf or have difficulty hearing?: No Does the patient have difficulty seeing, even when wearing glasses/contacts?: No Does the patient have difficulty concentrating, remembering, or making decisions?: No Does the patient have difficulty walking or climbing stairs?: No Does the patient have difficulty dressing or bathing?: No Does the patient have difficulty doing errands alone such as visiting a doctor's office or shopping?: No   Depression screen PHQ 2/9 11/05/2015  Decreased Interest 0  Down, Depressed, Hopeless 0  PHQ - 2 Score 0    Fall Risk  11/05/2015  Falls in the past year? 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 The current episode started in the past 7 days. The problem has been unchanged. There has been no fever. Associated symptoms include congestion and rhinorrhea.     Relevant past medical, surgical, family and social history reviewed and updated as indicated. Interim medical history since our last visit reviewed. Allergies and  medications reviewed and updated.  Review of Systems  Constitutional: Negative.   HENT: Positive for congestion and rhinorrhea.   Eyes: Negative.   Respiratory: Negative.   Cardiovascular: Negative.   Gastrointestinal: Negative.   Endocrine: Negative.   Genitourinary: Negative.   Musculoskeletal: Negative.   Skin: Negative.   Allergic/Immunologic: Negative.   Neurological: Negative.   Hematological: Negative.   Psychiatric/Behavioral: Negative.     Per HPI unless specifically indicated above     Objective:    BP 123/77 mmHg  Pulse 86  Temp(Src) 98.9 F (37.2 C)  Ht 5' 2.1" (1.577 m)  Wt 154 lb 9.6 oz (70.126 kg)  BMI 28.20 kg/m2  SpO2 96%  LMP 07/31/1981 (Approximate)  Wt Readings from Last 3 Encounters:  11/05/15 154 lb 9.6 oz (70.126 kg)  01/22/15 150 lb (68.04 kg)  07/28/15 149 lb (67.586 kg)    Physical Exam  Constitutional: She is oriented to person, place, and time. She appears well-developed and well-nourished.  HENT:  Head: Normocephalic and atraumatic.  Eyes: Pupils are equal, round, and reactive to light. Right eye exhibits no discharge. Left eye exhibits no discharge. No scleral icterus.  Neck: Normal range of motion. Neck supple. Carotid bruit is not present. No thyromegaly present.  Cardiovascular: Normal rate, regular rhythm and normal heart sounds.  Exam reveals no gallop and no friction rub.   No murmur heard. Pulmonary/Chest: Effort normal and breath sounds normal. No respiratory distress. She has no wheezes. She has no rales.  Abdominal: Soft. Bowel sounds are normal. There is no tenderness. There  is no rebound.  Genitourinary: No breast swelling, tenderness or discharge.  Musculoskeletal: Normal range of motion.  Lymphadenopathy:    She has no cervical adenopathy.  Neurological: She is alert and oriented to person, place, and time.  Skin: Skin is warm, dry and intact. No rash noted.  Psychiatric: She has a normal mood and affect. Her speech is  normal and behavior is normal. Judgment and thought content normal. Cognition and memory are normal.      Assessment & Plan:   Problem List Items Addressed This Visit      Unprioritized   Benign essential HTN - Primary   Relevant Orders   Comprehensive metabolic panel   Microalbumin, Urine Waived   Allergic rhinitis   Hyperlipidemia   Relevant Orders   Lipid Panel w/o Chol/HDL Ratio   Asthma    Other Visit Diagnoses    Annual physical exam        Relevant Orders    Hepatitis C antibody    URI (upper respiratory infection)        Tussinex for cough      Diagnosis stable.  Refill present meds   Follow up plan: Return in about 6 months (around 05/04/2016).

## 2015-11-06 LAB — COMPREHENSIVE METABOLIC PANEL
A/G RATIO: 1.4 (ref 1.1–2.5)
ALK PHOS: 67 IU/L (ref 39–117)
ALT: 8 IU/L (ref 0–32)
AST: 19 IU/L (ref 0–40)
Albumin: 4 g/dL (ref 3.6–4.8)
BUN/Creatinine Ratio: 12 (ref 11–26)
BUN: 13 mg/dL (ref 8–27)
Bilirubin Total: 0.2 mg/dL (ref 0.0–1.2)
CALCIUM: 9.8 mg/dL (ref 8.7–10.3)
CHLORIDE: 99 mmol/L (ref 97–106)
CO2: 26 mmol/L (ref 18–29)
Creatinine, Ser: 1.07 mg/dL — ABNORMAL HIGH (ref 0.57–1.00)
GFR calc Af Amer: 62 mL/min/{1.73_m2} (ref >59)
GFR, EST NON AFRICAN AMERICAN: 53 mL/min/{1.73_m2} — AB (ref >59)
Globulin, Total: 2.8 g/dL (ref 1.5–4.5)
Glucose: 81 mg/dL (ref 65–99)
POTASSIUM: 4.7 mmol/L (ref 3.5–5.2)
SODIUM: 139 mmol/L (ref 136–144)
Total Protein: 6.8 g/dL (ref 6.0–8.5)

## 2015-11-06 LAB — LIPID PANEL W/O CHOL/HDL RATIO
CHOLESTEROL TOTAL: 215 mg/dL — AB (ref 100–199)
HDL: 87 mg/dL (ref >39)
LDL Calculated: 106 mg/dL — ABNORMAL HIGH (ref 0–99)
TRIGLYCERIDES: 111 mg/dL (ref 0–149)
VLDL Cholesterol Cal: 22 mg/dL (ref 5–40)

## 2015-11-06 LAB — HEPATITIS C ANTIBODY: Hep C Virus Ab: 0.1 s/co ratio (ref 0.0–0.9)

## 2015-11-08 ENCOUNTER — Encounter: Payer: Self-pay | Admitting: Unknown Physician Specialty

## 2015-11-15 DIAGNOSIS — J301 Allergic rhinitis due to pollen: Secondary | ICD-10-CM | POA: Diagnosis not present

## 2015-11-22 DIAGNOSIS — J301 Allergic rhinitis due to pollen: Secondary | ICD-10-CM | POA: Diagnosis not present

## 2015-11-23 ENCOUNTER — Telehealth: Payer: Self-pay

## 2015-11-23 ENCOUNTER — Other Ambulatory Visit: Payer: Self-pay

## 2015-11-23 MED ORDER — FLUTICASONE-SALMETEROL 500-50 MCG/DOSE IN AEPB: 1.0000 | INHALATION_SPRAY | Freq: Two times a day (BID) | RESPIRATORY_TRACT | Status: DC

## 2015-11-23 NOTE — Telephone Encounter (Signed)
Called and left a voicemail letting patient know that medication was resent to the pharmacy. Asked for her to give [place] a call back if we could do anything else for her.

## 2015-11-23 NOTE — Telephone Encounter (Signed)
Patient called stating she needs a 3 month supply of her advair sent to [facility].

## 2015-11-23 NOTE — Telephone Encounter (Signed)
Called to let patient know that Advair was refilled and patient asked if it was for a 3 month supply and I told her that it looks like it was for a year. Patient stated that if it was not done for 90 days at a time, then she could only get a month supply. So needs it written for only 90 days at a time. Pharmacy is [facility].

## 2015-11-29 DIAGNOSIS — J301 Allergic rhinitis due to pollen: Secondary | ICD-10-CM | POA: Diagnosis not present

## 2015-12-06 DIAGNOSIS — J301 Allergic rhinitis due to pollen: Secondary | ICD-10-CM | POA: Diagnosis not present

## 2015-12-15 DIAGNOSIS — J301 Allergic rhinitis due to pollen: Secondary | ICD-10-CM | POA: Diagnosis not present

## 2015-12-19 HISTORY — PX: EYE SURGERY: SHX253

## 2015-12-22 DIAGNOSIS — J301 Allergic rhinitis due to pollen: Secondary | ICD-10-CM | POA: Diagnosis not present

## 2015-12-29 DIAGNOSIS — J301 Allergic rhinitis due to pollen: Secondary | ICD-10-CM | POA: Diagnosis not present

## 2016-01-03 DIAGNOSIS — J301 Allergic rhinitis due to pollen: Secondary | ICD-10-CM | POA: Diagnosis not present

## 2016-01-17 DIAGNOSIS — J301 Allergic rhinitis due to pollen: Secondary | ICD-10-CM | POA: Diagnosis not present

## 2016-01-24 DIAGNOSIS — J301 Allergic rhinitis due to pollen: Secondary | ICD-10-CM | POA: Diagnosis not present

## 2016-01-28 ENCOUNTER — Encounter: Payer: Self-pay | Admitting: Unknown Physician Specialty

## 2016-01-28 ENCOUNTER — Ambulatory Visit (INDEPENDENT_AMBULATORY_CARE_PROVIDER_SITE_OTHER): Payer: Medicare Other | Admitting: Unknown Physician Specialty

## 2016-01-28 VITALS — BP 122/77 | HR 87 | Temp 98.4°F | Ht 62.5 in | Wt 153.4 lb

## 2016-01-28 DIAGNOSIS — M7071 Other bursitis of hip, right hip: Secondary | ICD-10-CM | POA: Diagnosis not present

## 2016-01-28 DIAGNOSIS — M7072 Other bursitis of hip, left hip: Secondary | ICD-10-CM | POA: Insufficient documentation

## 2016-01-28 DIAGNOSIS — R252 Cramp and spasm: Secondary | ICD-10-CM | POA: Diagnosis not present

## 2016-01-28 NOTE — Assessment & Plan Note (Addendum)
Take Magnesium before you go to bed.  I use Magnesium Citrate.  I take Natural Calm.   Tonic water may be useful.  Discussed vinegar or mustard for cramps.

## 2016-01-28 NOTE — Patient Instructions (Addendum)
For night cramps: Before you go to bed, take tonic water Take Magnesium before you go to bed.  I use Magnesium Citrate.  I take Natural Calm.   If you get the cramp, take a tablespoon of yellow mustard or vinegar or vinegar and hot sauce.

## 2016-01-28 NOTE — Progress Notes (Signed)
BP 122/77 mmHg  Pulse 87  Temp(Src) 98.4 F (36.9 C)  Ht 5' 2.5" (1.588 m)  Wt 153 lb 6.4 oz (69.582 kg)  BMI 27.59 kg/m2  SpO2 99%  LMP 07/31/1981 (Approximate)   Subjective:    Patient ID: Nancy Mack, female    DOB: October 27, 1946, 70 y.o.   MRN: [id number]  HPI: Nancy Mack is a 70 y.o. female  Chief Complaint  Patient presents with  . other    pt states she is here for having cramps. She states he has been having cramps under left breast, right lower abdomen, legs, and foot. States cramps started about a month ago but are happening more frequently  . Hip Pain    pt states her right hip has been bothering her again, states it has done this in the past   Cramps right leg, right foot, right groin and under left breast.  States they come and go and tries to massage them.  Lately they are happening once a day at various spots of the aforementioned places.  States they last about 5-10 minutes and happens at night most of the time.    Rt hip pain.  Bothering her again.  Points to the lateral hip.  Relieved at one point with an injection rt bursae.  Tries without relief.  Not sure what makes it worse or better but will come and go.    Relevant past medical, surgical, family and social history reviewed and updated as indicated. Interim medical history since our last visit reviewed. Allergies and medications reviewed and updated.  Review of Systems  Constitutional: Negative.   HENT:       Allergies  Eyes: Negative.   Respiratory: Negative.   Cardiovascular: Negative.   Gastrointestinal: Negative.   Genitourinary: Negative.   Psychiatric/Behavioral: Negative.     Per HPI unless specifically indicated above     Objective:    BP 122/77 mmHg  Pulse 87  Temp(Src) 98.4 F (36.9 C)  Ht 5' 2.5" (1.588 m)  Wt 153 lb 6.4 oz (69.582 kg)  BMI 27.59 kg/m2  SpO2 99%  LMP 07/31/1981 (Approximate)  Wt Readings from Last 3 Encounters:  01/28/16 153 lb 6.4 oz (69.582 kg)   11/05/15 154 lb 9.6 oz (70.126 kg)  01/22/15 150 lb (68.04 kg)    Physical Exam  Constitutional: She is oriented to person, place, and time. She appears well-developed and well-nourished. No distress.  HENT:  Head: Normocephalic and atraumatic.  Eyes: Conjunctivae and lids are normal. Right eye exhibits no discharge. Left eye exhibits no discharge. No scleral icterus.  Neck: Normal range of motion. Neck supple. No JVD present. Carotid bruit is not present.  Cardiovascular: Normal rate, regular rhythm and normal heart sounds.   Pulmonary/Chest: Effort normal and breath sounds normal.  Abdominal: Soft. Normal appearance and bowel sounds are normal. She exhibits no distension. There is no splenomegaly or hepatomegaly. There is no tenderness.  Musculoskeletal: Normal range of motion.       Right hip: She exhibits tenderness.  Tender at greater trochanter  Neurological: She is alert and oriented to person, place, and time.  Skin: Skin is warm, dry and intact. No rash noted. No pallor.  Psychiatric: She has a normal mood and affect. Her behavior is normal. Judgment and thought content normal.    Results for orders placed or performed in visit on 11/05/15  Hepatitis C antibody  Result Value Ref Range   Hep C Virus Ab 0.1 0.0 -  0.9 s/co ratio  Comprehensive metabolic panel  Result Value Ref Range   Glucose 81 65 - 99 mg/dL   BUN 13 8 - 27 mg/dL   Creatinine, Ser 1.07 (H) 0.57 - 1.00 mg/dL   GFR calc non Af Amer 53 (L) >59 mL/min/1.73   GFR calc Af Amer 62 >59 mL/min/1.73   BUN/Creatinine Ratio 12 11 - 26   Sodium 139 136 - 144 mmol/L   Potassium 4.7 3.5 - 5.2 mmol/L   Chloride 99 97 - 106 mmol/L   CO2 26 18 - 29 mmol/L   Calcium 9.8 8.7 - 10.3 mg/dL   Total Protein 6.8 6.0 - 8.5 g/dL   Albumin 4.0 3.6 - 4.8 g/dL   Globulin, Total 2.8 1.5 - 4.5 g/dL   Albumin/Globulin Ratio 1.4 1.1 - 2.5   Bilirubin Total 0.2 0.0 - 1.2 mg/dL   Alkaline Phosphatase 67 39 - 117 IU/L   AST 19 0 - 40  IU/L   ALT 8 0 - 32 IU/L  Microalbumin, Urine Waived  Result Value Ref Range   Microalb, Ur Waived 10 0 - 19 mg/L   Creatinine, Urine Waived 50 10 - 300 mg/dL   Microalb/Creat Ratio <30 <30 mg/g  Lipid Panel w/o Chol/HDL Ratio  Result Value Ref Range   Cholesterol, Total 215 (H) 100 - 199 mg/dL   Triglycerides 111 0 - 149 mg/dL   HDL 87 >39 mg/dL   VLDL Cholesterol Cal 22 5 - 40 mg/dL   LDL Calculated 106 (H) 0 - 99 mg/dL      Assessment & Plan:   Problem List Items Addressed This Visit      Unprioritized   Bursitis of right hip - Primary   Nocturnal muscle cramps    Take Magnesium before you go to bed.  I use Magnesium Citrate.  I take Natural Calm.   Tonic water may be useful.  Discussed vinegar or mustard for cramps.             Follow up plan: Return for RTC for right hip injection.

## 2016-01-29 LAB — COMPREHENSIVE METABOLIC PANEL
ALBUMIN: 4.3 g/dL (ref 3.6–4.8)
ALT: 11 IU/L (ref 0–32)
AST: 19 IU/L (ref 0–40)
Albumin/Globulin Ratio: 1.7 (ref 1.1–2.5)
Alkaline Phosphatase: 88 IU/L (ref 39–117)
BUN / CREAT RATIO: 8 — AB (ref 11–26)
BUN: 8 mg/dL (ref 8–27)
Bilirubin Total: 0.3 mg/dL (ref 0.0–1.2)
CO2: 26 mmol/L (ref 18–29)
CREATININE: 1.03 mg/dL — AB (ref 0.57–1.00)
Calcium: 9.5 mg/dL (ref 8.7–10.3)
Chloride: 98 mmol/L (ref 96–106)
GFR, EST AFRICAN AMERICAN: 64 mL/min/{1.73_m2} (ref >59)
GFR, EST NON AFRICAN AMERICAN: 56 mL/min/{1.73_m2} — AB (ref >59)
GLOBULIN, TOTAL: 2.6 g/dL (ref 1.5–4.5)
GLUCOSE: 94 mg/dL (ref 65–99)
Potassium: 4.3 mmol/L (ref 3.5–5.2)
SODIUM: 143 mmol/L (ref 134–144)
TOTAL PROTEIN: 6.9 g/dL (ref 6.0–8.5)

## 2016-01-29 LAB — CBC WITH DIFFERENTIAL/PLATELET
BASOS ABS: 0 10*3/uL (ref 0.0–0.2)
Basos: 0 %
EOS (ABSOLUTE): 0.3 10*3/uL (ref 0.0–0.4)
EOS: 3 %
HEMATOCRIT: 38.8 % (ref 34.0–46.6)
HEMOGLOBIN: 13 g/dL (ref 11.1–15.9)
IMMATURE GRANS (ABS): 0 10*3/uL (ref 0.0–0.1)
Immature Granulocytes: 0 %
LYMPHS: 18 %
Lymphocytes Absolute: 1.4 10*3/uL (ref 0.7–3.1)
MCH: 26.6 pg (ref 26.6–33.0)
MCHC: 33.5 g/dL (ref 31.5–35.7)
MCV: 79 fL (ref 79–97)
MONOCYTES: 8 %
Monocytes Absolute: 0.6 10*3/uL (ref 0.1–0.9)
NEUTROS ABS: 5.5 10*3/uL (ref 1.4–7.0)
Neutrophils: 71 %
Platelets: 402 10*3/uL — ABNORMAL HIGH (ref 150–379)
RBC: 4.89 x10E6/uL (ref 3.77–5.28)
RDW: 14.7 % (ref 12.3–15.4)
WBC: 7.8 10*3/uL (ref 3.4–10.8)

## 2016-01-31 ENCOUNTER — Encounter: Payer: Self-pay | Admitting: Unknown Physician Specialty

## 2016-01-31 NOTE — Progress Notes (Signed)
Quick Note:  Normal labs. Patient notified by letter. ______ 

## 2016-02-01 ENCOUNTER — Ambulatory Visit: Payer: Medicare Other | Admitting: Unknown Physician Specialty

## 2016-02-03 DIAGNOSIS — J301 Allergic rhinitis due to pollen: Secondary | ICD-10-CM | POA: Diagnosis not present

## 2016-02-04 DIAGNOSIS — J301 Allergic rhinitis due to pollen: Secondary | ICD-10-CM | POA: Diagnosis not present

## 2016-02-09 DIAGNOSIS — J301 Allergic rhinitis due to pollen: Secondary | ICD-10-CM | POA: Diagnosis not present

## 2016-02-14 DIAGNOSIS — J301 Allergic rhinitis due to pollen: Secondary | ICD-10-CM | POA: Diagnosis not present

## 2016-02-21 DIAGNOSIS — J301 Allergic rhinitis due to pollen: Secondary | ICD-10-CM | POA: Diagnosis not present

## 2016-02-28 DIAGNOSIS — J301 Allergic rhinitis due to pollen: Secondary | ICD-10-CM | POA: Diagnosis not present

## 2016-03-08 DIAGNOSIS — J301 Allergic rhinitis due to pollen: Secondary | ICD-10-CM | POA: Diagnosis not present

## 2016-03-13 DIAGNOSIS — J301 Allergic rhinitis due to pollen: Secondary | ICD-10-CM | POA: Diagnosis not present

## 2016-03-20 DIAGNOSIS — J301 Allergic rhinitis due to pollen: Secondary | ICD-10-CM | POA: Diagnosis not present

## 2016-03-27 DIAGNOSIS — J301 Allergic rhinitis due to pollen: Secondary | ICD-10-CM | POA: Diagnosis not present

## 2016-04-03 DIAGNOSIS — J301 Allergic rhinitis due to pollen: Secondary | ICD-10-CM | POA: Diagnosis not present

## 2016-04-10 DIAGNOSIS — J301 Allergic rhinitis due to pollen: Secondary | ICD-10-CM | POA: Diagnosis not present

## 2016-04-17 ENCOUNTER — Other Ambulatory Visit: Payer: Self-pay | Admitting: Unknown Physician Specialty

## 2016-04-17 DIAGNOSIS — J301 Allergic rhinitis due to pollen: Secondary | ICD-10-CM | POA: Diagnosis not present

## 2016-04-17 MED ORDER — FLUTICASONE PROPIONATE 50 MCG/ACT NA SUSP: 2.0000 | Freq: Every day | NASAL | Status: DC

## 2016-04-17 NOTE — Telephone Encounter (Signed)
Routing to provider. Patient requests 3 month supply.

## 2016-04-17 NOTE — Telephone Encounter (Signed)
Pt would like to have fluticasone (FLONASE) 50 MCG/ACT nasal spray sent to [facility] court for a 3 months supply

## 2016-04-21 DIAGNOSIS — J301 Allergic rhinitis due to pollen: Secondary | ICD-10-CM | POA: Diagnosis not present

## 2016-04-24 DIAGNOSIS — J301 Allergic rhinitis due to pollen: Secondary | ICD-10-CM | POA: Diagnosis not present

## 2016-04-28 DIAGNOSIS — H6063 Unspecified chronic otitis externa, bilateral: Secondary | ICD-10-CM | POA: Diagnosis not present

## 2016-04-28 DIAGNOSIS — J301 Allergic rhinitis due to pollen: Secondary | ICD-10-CM | POA: Diagnosis not present

## 2016-04-28 DIAGNOSIS — K219 Gastro-esophageal reflux disease without esophagitis: Secondary | ICD-10-CM | POA: Diagnosis not present

## 2016-05-01 DIAGNOSIS — J301 Allergic rhinitis due to pollen: Secondary | ICD-10-CM | POA: Diagnosis not present

## 2016-05-05 ENCOUNTER — Encounter: Payer: Self-pay | Admitting: Unknown Physician Specialty

## 2016-05-05 ENCOUNTER — Ambulatory Visit (INDEPENDENT_AMBULATORY_CARE_PROVIDER_SITE_OTHER): Payer: Medicare Other | Admitting: Unknown Physician Specialty

## 2016-05-05 VITALS — BP 110/69 | HR 83 | Temp 98.4°F | Ht 62.7 in | Wt 148.6 lb

## 2016-05-05 DIAGNOSIS — E785 Hyperlipidemia, unspecified: Secondary | ICD-10-CM

## 2016-05-05 DIAGNOSIS — E2839 Other primary ovarian failure: Secondary | ICD-10-CM

## 2016-05-05 DIAGNOSIS — J45909 Unspecified asthma, uncomplicated: Secondary | ICD-10-CM

## 2016-05-05 DIAGNOSIS — I10 Essential (primary) hypertension: Secondary | ICD-10-CM

## 2016-05-05 NOTE — Assessment & Plan Note (Signed)
Stable, continue present medications.   

## 2016-05-05 NOTE — Progress Notes (Signed)
BP 110/69 mmHg  Pulse 83  Temp(Src) 98.4 F (36.9 C)  Ht 5' 2.7" (1.593 m)  Wt 148 lb 9.6 oz (67.405 kg)  BMI 26.56 kg/m2  SpO2 96%  LMP 07/31/1981 (Approximate)   Subjective:    Patient ID: Nancy Mack, female    DOB: [birth year], 70 y.o.   MRN: [id number]  HPI: Nancy Mack is a 70 y.o. female  Chief Complaint  Patient presents with  . Hyperlipidemia  . Hypertension   Hypertension Using medications without difficulty Average home BPs Good numbers at home  No problems or lightheadedness No chest pain with exertion or shortness of breath No Edema   Hyperlipidemia Using medications without problems No Muscle aches  Diet compliance: "OK" diet Exercise: "somewhat  Hip Bursitis Rt hip pain comes and goes.  Takes Tylenol which gives relief  ASTHMA Triggers: hot weather Using rescue inhaler on occasion Symptoms are well controlled Using medications without problems: Night time symptoms: no Wheeze/SOB:periodically ER visits since last visit:none   Relevant past medical, surgical, family and social history reviewed and updated as indicated. Interim medical history since our last visit reviewed. Allergies and medications reviewed and updated.  Review of Systems  Per HPI unless specifically indicated above     Objective:    BP 110/69 mmHg  Pulse 83  Temp(Src) 98.4 F (36.9 C)  Ht 5' 2.7" (1.593 m)  Wt 148 lb 9.6 oz (67.405 kg)  BMI 26.56 kg/m2  SpO2 96%  LMP 07/31/1981 (Approximate)  Wt Readings from Last 3 Encounters:  05/05/16 148 lb 9.6 oz (67.405 kg)  01/28/16 153 lb 6.4 oz (69.582 kg)  11/05/15 154 lb 9.6 oz (70.126 kg)    Physical Exam  Constitutional: She is oriented to person, place, and time. She appears well-developed and well-nourished. No distress.  HENT:  Head: Normocephalic and atraumatic.  Eyes: Conjunctivae and lids are normal. Right eye exhibits no discharge. Left eye exhibits no discharge. No scleral icterus.  Neck:  Normal range of motion. Neck supple. No JVD present. Carotid bruit is not present.  Cardiovascular: Normal rate, regular rhythm and normal heart sounds.   Pulmonary/Chest: Effort normal and breath sounds normal.  Abdominal: Normal appearance. There is no splenomegaly or hepatomegaly.  Musculoskeletal: Normal range of motion.  Neurological: She is alert and oriented to person, place, and time.  Skin: Skin is warm, dry and intact. No rash noted. No pallor.  Psychiatric: She has a normal mood and affect. Her behavior is normal. Judgment and thought content normal.    Results for orders placed or performed in visit on 01/28/16  CBC with Differential/Platelet  Result Value Ref Range   WBC 7.8 3.4 - 10.8 x10E3/uL   RBC 4.89 3.77 - 5.28 x10E6/uL   Hemoglobin 13.0 11.1 - 15.9 g/dL   Hematocrit 38.8 34.0 - 46.6 %   MCV 79 79 - 97 fL   MCH 26.6 26.6 - 33.0 pg   MCHC 33.5 31.5 - 35.7 g/dL   RDW 14.7 12.3 - 15.4 %   Platelets 402 (H) 150 - 379 x10E3/uL   Neutrophils 71 %   Lymphs 18 %   Monocytes 8 %   Eos 3 %   Basos 0 %   Neutrophils Absolute 5.5 1.4 - 7.0 x10E3/uL   Lymphocytes Absolute 1.4 0.7 - 3.1 x10E3/uL   Monocytes Absolute 0.6 0.1 - 0.9 x10E3/uL   EOS (ABSOLUTE) 0.3 0.0 - 0.4 x10E3/uL   Basophils Absolute 0.0 0.0 - 0.2 x10E3/uL   Immature  Granulocytes 0 %   Immature Grans (Abs) 0.0 0.0 - 0.1 x10E3/uL  Comprehensive metabolic panel  Result Value Ref Range   Glucose 94 65 - 99 mg/dL   BUN 8 8 - 27 mg/dL   Creatinine, Ser 1.03 (H) 0.57 - 1.00 mg/dL   GFR calc non Af Amer 56 (L) >59 mL/min/1.73   GFR calc Af Amer 64 >59 mL/min/1.73   BUN/Creatinine Ratio 8 (L) 11 - 26   Sodium 143 134 - 144 mmol/L   Potassium 4.3 3.5 - 5.2 mmol/L   Chloride 98 96 - 106 mmol/L   CO2 26 18 - 29 mmol/L   Calcium 9.5 8.7 - 10.3 mg/dL   Total Protein 6.9 6.0 - 8.5 g/dL   Albumin 4.3 3.6 - 4.8 g/dL   Globulin, Total 2.6 1.5 - 4.5 g/dL   Albumin/Globulin Ratio 1.7 1.1 - 2.5   Bilirubin Total 0.3  0.0 - 1.2 mg/dL   Alkaline Phosphatase 88 39 - 117 IU/L   AST 19 0 - 40 IU/L   ALT 11 0 - 32 IU/L      Assessment & Plan:   Problem List Items Addressed This Visit      Unprioritized   Asthma    Stable, continue present medications.        Benign essential HTN - Primary    Stable, continue present medications.        Relevant Orders   Comprehensive metabolic panel   Hyperlipidemia    Stable, continue present medications.        Relevant Orders   Lipid Panel w/o Chol/HDL Ratio    Other Visit Diagnoses    Ovarian failure        Relevant Orders    DG Bone Density        Follow up plan: Return in about 6 months (around 11/05/2016) for physical.

## 2016-05-05 NOTE — Patient Instructions (Addendum)
Please do call to schedule your bone density; the number to schedule one at either [facility] or [facility] is [phone]   

## 2016-05-06 LAB — LIPID PANEL W/O CHOL/HDL RATIO
Cholesterol, Total: 174 mg/dL (ref 100–199)
HDL: 65 mg/dL (ref >39)
LDL Calculated: 80 mg/dL (ref 0–99)
Triglycerides: 143 mg/dL (ref 0–149)
VLDL Cholesterol Cal: 29 mg/dL (ref 5–40)

## 2016-05-06 LAB — COMPREHENSIVE METABOLIC PANEL WITH GFR
ALT: 9 [IU]/L (ref 0–32)
AST: 16 [IU]/L (ref 0–40)
Albumin/Globulin Ratio: 1.5 (ref 1.2–2.2)
Albumin: 3.8 g/dL (ref 3.6–4.8)
Alkaline Phosphatase: 64 [IU]/L (ref 39–117)
BUN/Creatinine Ratio: 11 — ABNORMAL LOW (ref 12–28)
BUN: 10 mg/dL (ref 8–27)
Bilirubin Total: 0.3 mg/dL (ref 0.0–1.2)
CO2: 24 mmol/L (ref 18–29)
Calcium: 9.5 mg/dL (ref 8.7–10.3)
Chloride: 97 mmol/L (ref 96–106)
Creatinine, Ser: 0.92 mg/dL (ref 0.57–1.00)
GFR calc Af Amer: 73 mL/min/{1.73_m2}
GFR calc non Af Amer: 64 mL/min/{1.73_m2}
Globulin, Total: 2.6 g/dL (ref 1.5–4.5)
Glucose: 97 mg/dL (ref 65–99)
Potassium: 4.3 mmol/L (ref 3.5–5.2)
Sodium: 140 mmol/L (ref 134–144)
Total Protein: 6.4 g/dL (ref 6.0–8.5)

## 2016-05-08 ENCOUNTER — Encounter: Payer: Self-pay | Admitting: Unknown Physician Specialty

## 2016-05-08 DIAGNOSIS — J301 Allergic rhinitis due to pollen: Secondary | ICD-10-CM | POA: Diagnosis not present

## 2016-05-08 NOTE — Progress Notes (Signed)
Quick Note:  Normal labs. Patient notified by letter. ______ 

## 2016-05-17 DIAGNOSIS — J301 Allergic rhinitis due to pollen: Secondary | ICD-10-CM | POA: Diagnosis not present

## 2016-05-18 ENCOUNTER — Ambulatory Visit
Admission: RE | Admit: 2016-05-18 | Discharge: 2016-05-18 | Disposition: A | Discharge status: Home / Self Care | Payer: Medicare Other | Source: Ambulatory Visit | Attending: Unknown Physician Specialty | Admitting: Unknown Physician Specialty

## 2016-05-18 DIAGNOSIS — E2839 Other primary ovarian failure: Secondary | ICD-10-CM | POA: Insufficient documentation

## 2016-05-18 DIAGNOSIS — Z13820 Encounter for screening for osteoporosis: Secondary | ICD-10-CM | POA: Diagnosis not present

## 2016-05-22 DIAGNOSIS — J301 Allergic rhinitis due to pollen: Secondary | ICD-10-CM | POA: Diagnosis not present

## 2016-05-29 DIAGNOSIS — J301 Allergic rhinitis due to pollen: Secondary | ICD-10-CM | POA: Diagnosis not present

## 2016-06-05 DIAGNOSIS — J301 Allergic rhinitis due to pollen: Secondary | ICD-10-CM | POA: Diagnosis not present

## 2016-06-14 DIAGNOSIS — J301 Allergic rhinitis due to pollen: Secondary | ICD-10-CM | POA: Diagnosis not present

## 2016-06-19 DIAGNOSIS — J301 Allergic rhinitis due to pollen: Secondary | ICD-10-CM | POA: Diagnosis not present

## 2016-06-22 DIAGNOSIS — H4010X0 Unspecified open-angle glaucoma, stage unspecified: Secondary | ICD-10-CM | POA: Diagnosis not present

## 2016-06-26 DIAGNOSIS — J301 Allergic rhinitis due to pollen: Secondary | ICD-10-CM | POA: Diagnosis not present

## 2016-07-03 DIAGNOSIS — J301 Allergic rhinitis due to pollen: Secondary | ICD-10-CM | POA: Diagnosis not present

## 2016-07-10 DIAGNOSIS — J301 Allergic rhinitis due to pollen: Secondary | ICD-10-CM | POA: Diagnosis not present

## 2016-07-14 DIAGNOSIS — J301 Allergic rhinitis due to pollen: Secondary | ICD-10-CM | POA: Diagnosis not present

## 2016-07-17 DIAGNOSIS — J301 Allergic rhinitis due to pollen: Secondary | ICD-10-CM | POA: Diagnosis not present

## 2016-07-24 ENCOUNTER — Other Ambulatory Visit: Payer: Self-pay | Admitting: Unknown Physician Specialty

## 2016-07-24 DIAGNOSIS — J301 Allergic rhinitis due to pollen: Secondary | ICD-10-CM | POA: Diagnosis not present

## 2016-07-31 DIAGNOSIS — J301 Allergic rhinitis due to pollen: Secondary | ICD-10-CM | POA: Diagnosis not present

## 2016-08-07 DIAGNOSIS — J301 Allergic rhinitis due to pollen: Secondary | ICD-10-CM | POA: Diagnosis not present

## 2016-08-14 DIAGNOSIS — J301 Allergic rhinitis due to pollen: Secondary | ICD-10-CM | POA: Diagnosis not present

## 2016-08-24 ENCOUNTER — Other Ambulatory Visit: Payer: Self-pay | Admitting: Unknown Physician Specialty

## 2016-08-28 DIAGNOSIS — J301 Allergic rhinitis due to pollen: Secondary | ICD-10-CM | POA: Diagnosis not present

## 2016-09-04 DIAGNOSIS — J301 Allergic rhinitis due to pollen: Secondary | ICD-10-CM | POA: Diagnosis not present

## 2016-09-11 DIAGNOSIS — J301 Allergic rhinitis due to pollen: Secondary | ICD-10-CM | POA: Diagnosis not present

## 2016-09-18 DIAGNOSIS — J301 Allergic rhinitis due to pollen: Secondary | ICD-10-CM | POA: Diagnosis not present

## 2016-09-21 ENCOUNTER — Ambulatory Visit (INDEPENDENT_AMBULATORY_CARE_PROVIDER_SITE_OTHER): Payer: Medicare Other

## 2016-09-21 DIAGNOSIS — Z23 Encounter for immunization: Secondary | ICD-10-CM | POA: Diagnosis not present

## 2016-09-25 DIAGNOSIS — J301 Allergic rhinitis due to pollen: Secondary | ICD-10-CM | POA: Diagnosis not present

## 2016-10-02 DIAGNOSIS — J301 Allergic rhinitis due to pollen: Secondary | ICD-10-CM | POA: Diagnosis not present

## 2016-10-06 DIAGNOSIS — J301 Allergic rhinitis due to pollen: Secondary | ICD-10-CM | POA: Diagnosis not present

## 2016-10-09 DIAGNOSIS — J301 Allergic rhinitis due to pollen: Secondary | ICD-10-CM | POA: Diagnosis not present

## 2016-10-16 DIAGNOSIS — J301 Allergic rhinitis due to pollen: Secondary | ICD-10-CM | POA: Diagnosis not present

## 2016-10-19 ENCOUNTER — Other Ambulatory Visit: Payer: Self-pay | Admitting: Family Medicine

## 2016-10-19 NOTE — Telephone Encounter (Signed)
Routing to provider  

## 2016-10-30 DIAGNOSIS — J301 Allergic rhinitis due to pollen: Secondary | ICD-10-CM | POA: Diagnosis not present

## 2016-11-06 ENCOUNTER — Encounter: Payer: Self-pay | Admitting: Unknown Physician Specialty

## 2016-11-06 ENCOUNTER — Ambulatory Visit (INDEPENDENT_AMBULATORY_CARE_PROVIDER_SITE_OTHER): Payer: Medicare Other | Admitting: Unknown Physician Specialty

## 2016-11-06 VITALS — BP 121/75 | HR 92 | Temp 98.8°F | Wt 150.6 lb

## 2016-11-06 DIAGNOSIS — J301 Allergic rhinitis due to pollen: Secondary | ICD-10-CM | POA: Diagnosis not present

## 2016-11-06 DIAGNOSIS — J00 Acute nasopharyngitis [common cold]: Secondary | ICD-10-CM

## 2016-11-06 MED ORDER — HYDROCOD POLST-CPM POLST ER 10-8 MG/5ML PO SUER: 5.0000 mL | Freq: Two times a day (BID) | ORAL | 0 refills | Status: DC | PRN

## 2016-11-06 MED ORDER — AZITHROMYCIN 250 MG PO TABS: ORAL_TABLET | ORAL | 0 refills | Status: DC

## 2016-11-06 NOTE — Progress Notes (Signed)
BP 121/75 (BP Location: Left Arm, Patient Position: Sitting, Cuff Size: Normal)   Pulse 92   Temp 98.8 F (37.1 C)   Wt 150 lb 9.6 oz (68.3 kg)   LMP 07/31/1981 (Approximate)   SpO2 95%   BMI 26.93 kg/m    Subjective:    Patient ID: Nancy Mack, female    DOB: 11/25/1946, 70 y.o.   MRN: [id number]  HPI: Nancy Mack is a 70 y.o. female  Chief Complaint  Patient presents with  . URI    pt states she has a cough, nasal and chest congestion, sinus pressure, and discolored phlegm. States symptoms started about a week ago.   . Hip Pain    right hip   URI   This is a new problem. The current episode started in the past 7 days. The problem has been gradually worsening. There has been no fever. Associated symptoms include congestion, coughing and rhinorrhea. She has tried nothing for the symptoms.    Relevant past medical, surgical, family and social history reviewed and updated as indicated. Interim medical history since our last visit reviewed. Allergies and medications reviewed and updated.  Review of Systems  HENT: Positive for congestion and rhinorrhea.   Respiratory: Positive for cough.     Per HPI unless specifically indicated above     Objective:    BP 121/75 (BP Location: Left Arm, Patient Position: Sitting, Cuff Size: Normal)   Pulse 92   Temp 98.8 F (37.1 C)   Wt 150 lb 9.6 oz (68.3 kg)   LMP 07/31/1981 (Approximate)   SpO2 95%   BMI 26.93 kg/m   Wt Readings from Last 3 Encounters:  11/06/16 150 lb 9.6 oz (68.3 kg)  05/05/16 148 lb 9.6 oz (67.4 kg)  01/28/16 153 lb 6.4 oz (69.6 kg)    Physical Exam  Constitutional: She is oriented to person, place, and time. She appears well-developed and well-nourished. No distress.  HENT:  Head: Normocephalic and atraumatic.  Right Ear: Tympanic membrane and ear canal normal.  Left Ear: Tympanic membrane and ear canal normal.  Nose: Rhinorrhea present. Right sinus exhibits no maxillary sinus tenderness  and no frontal sinus tenderness. Left sinus exhibits no maxillary sinus tenderness and no frontal sinus tenderness.  Mouth/Throat: Mucous membranes are normal. Posterior oropharyngeal erythema present.  Eyes: Conjunctivae and lids are normal. Right eye exhibits no discharge. Left eye exhibits no discharge. No scleral icterus.  Cardiovascular: Normal rate and regular rhythm.   Pulmonary/Chest: Effort normal and breath sounds normal. No respiratory distress.  Abdominal: Normal appearance. There is no splenomegaly or hepatomegaly.  Musculoskeletal: Normal range of motion.  Neurological: She is alert and oriented to person, place, and time.  Skin: Skin is intact. No rash noted. No pallor.  Psychiatric: She has a normal mood and affect. Her behavior is normal. Judgment and thought content normal.    Results for orders placed or performed in visit on 05/05/16  Comprehensive metabolic panel  Result Value Ref Range   Glucose 97 65 - 99 mg/dL   BUN 10 8 - 27 mg/dL   Creatinine, Ser 0.92 0.57 - 1.00 mg/dL   GFR calc non Af Amer 64 >59 mL/min/1.73   GFR calc Af Amer 73 >59 mL/min/1.73   BUN/Creatinine Ratio 11 (L) 12 - 28   Sodium 140 134 - 144 mmol/L   Potassium 4.3 3.5 - 5.2 mmol/L   Chloride 97 96 - 106 mmol/L   CO2 24 18 - 29 mmol/L  Calcium 9.5 8.7 - 10.3 mg/dL   Total Protein 6.4 6.0 - 8.5 g/dL   Albumin 3.8 3.6 - 4.8 g/dL   Globulin, Total 2.6 1.5 - 4.5 g/dL   Albumin/Globulin Ratio 1.5 1.2 - 2.2   Bilirubin Total 0.3 0.0 - 1.2 mg/dL   Alkaline Phosphatase 64 39 - 117 IU/L   AST 16 0 - 40 IU/L   ALT 9 0 - 32 IU/L  Lipid Panel w/o Chol/HDL Ratio  Result Value Ref Range   Cholesterol, Total 174 100 - 199 mg/dL   Triglycerides 143 0 - 149 mg/dL   HDL 65 >39 mg/dL   VLDL Cholesterol Cal 29 5 - 40 mg/dL   LDL Calculated 80 0 - 99 mg/dL      Assessment & Plan:   Problem List Items Addressed This Visit    None    Visit Diagnoses    Acute nasopharyngitis    -  Primary    Relevant Medications   azithromycin (ZITHROMAX) 250 MG tablet       Follow up plan: Return if symptoms worsen or fail to improve.

## 2016-11-13 DIAGNOSIS — J301 Allergic rhinitis due to pollen: Secondary | ICD-10-CM | POA: Diagnosis not present

## 2016-11-17 ENCOUNTER — Other Ambulatory Visit: Payer: Self-pay | Admitting: Unknown Physician Specialty

## 2016-11-20 ENCOUNTER — Other Ambulatory Visit: Payer: Self-pay | Admitting: Unknown Physician Specialty

## 2016-11-20 DIAGNOSIS — J301 Allergic rhinitis due to pollen: Secondary | ICD-10-CM | POA: Diagnosis not present

## 2016-11-21 ENCOUNTER — Other Ambulatory Visit: Payer: Self-pay | Admitting: Unknown Physician Specialty

## 2016-11-21 ENCOUNTER — Encounter: Payer: Medicare Other | Admitting: Unknown Physician Specialty

## 2016-11-27 DIAGNOSIS — J301 Allergic rhinitis due to pollen: Secondary | ICD-10-CM | POA: Diagnosis not present

## 2016-12-04 DIAGNOSIS — J301 Allergic rhinitis due to pollen: Secondary | ICD-10-CM | POA: Diagnosis not present

## 2016-12-20 DIAGNOSIS — J301 Allergic rhinitis due to pollen: Secondary | ICD-10-CM | POA: Diagnosis not present

## 2016-12-25 DIAGNOSIS — J301 Allergic rhinitis due to pollen: Secondary | ICD-10-CM | POA: Diagnosis not present

## 2017-01-01 ENCOUNTER — Other Ambulatory Visit: Payer: Self-pay | Admitting: Unknown Physician Specialty

## 2017-01-05 DIAGNOSIS — J301 Allergic rhinitis due to pollen: Secondary | ICD-10-CM | POA: Diagnosis not present

## 2017-01-08 DIAGNOSIS — J301 Allergic rhinitis due to pollen: Secondary | ICD-10-CM | POA: Diagnosis not present

## 2017-01-15 ENCOUNTER — Other Ambulatory Visit: Payer: Self-pay | Admitting: Unknown Physician Specialty

## 2017-01-15 DIAGNOSIS — J301 Allergic rhinitis due to pollen: Secondary | ICD-10-CM | POA: Diagnosis not present

## 2017-01-23 ENCOUNTER — Ambulatory Visit (INDEPENDENT_AMBULATORY_CARE_PROVIDER_SITE_OTHER): Payer: Medicare Other | Admitting: Unknown Physician Specialty

## 2017-01-23 ENCOUNTER — Encounter: Payer: Self-pay | Admitting: Unknown Physician Specialty

## 2017-01-23 VITALS — BP 124/77 | HR 80 | Temp 97.9°F | Ht 62.5 in | Wt 149.0 lb

## 2017-01-23 DIAGNOSIS — Z1231 Encounter for screening mammogram for malignant neoplasm of breast: Secondary | ICD-10-CM | POA: Diagnosis not present

## 2017-01-23 DIAGNOSIS — J3089 Other allergic rhinitis: Secondary | ICD-10-CM | POA: Diagnosis not present

## 2017-01-23 DIAGNOSIS — J45909 Unspecified asthma, uncomplicated: Secondary | ICD-10-CM

## 2017-01-23 DIAGNOSIS — Z0000 Encounter for general adult medical examination without abnormal findings: Secondary | ICD-10-CM

## 2017-01-23 DIAGNOSIS — E782 Mixed hyperlipidemia: Secondary | ICD-10-CM | POA: Diagnosis not present

## 2017-01-23 DIAGNOSIS — I10 Essential (primary) hypertension: Secondary | ICD-10-CM | POA: Diagnosis not present

## 2017-01-23 NOTE — Assessment & Plan Note (Signed)
Stable, continue present medications.   

## 2017-01-23 NOTE — Patient Instructions (Signed)
Call [facility] for mammogram

## 2017-01-23 NOTE — Progress Notes (Signed)
BP 124/77 (BP Location: Left Arm, Patient Position: Sitting, Cuff Size: Normal)   Pulse 80   Temp 97.9 F (36.6 C)   Ht 5' 2.5" (1.588 m)   Wt 149 lb (67.6 kg)   LMP 07/31/1981 (Approximate)   SpO2 97%   BMI 26.82 kg/m    Subjective:    Patient ID: Nancy Mack, female    DOB: [birth year], 71 y.o.   MRN: [id number]  HPI: Nancy Mack is a 71 y.o. female  Chief Complaint  Patient presents with  . Medicare Wellness   Functional Status Survey: Is the patient deaf or have difficulty hearing?: No Does the patient have difficulty seeing, even when wearing glasses/contacts?: No Does the patient have difficulty concentrating, remembering, or making decisions?: No Does the patient have difficulty walking or climbing stairs?: Yes (pt states with too many steps, she gets out of breath ) Does the patient have difficulty dressing or bathing?: No Does the patient have difficulty doing errands alone such as visiting a doctor's office or shopping?: No  Fall Risk  01/23/2017 11/06/2016 11/05/2015  Falls in the past year? No No No   Depression screen [facility] 2/9 01/23/2017 11/06/2016 11/05/2015  Decreased Interest 0 0 0  Down, Depressed, Hopeless 0 0 0  PHQ - 2 Score 0 0 0  Altered sleeping 0 - -  Tired, decreased energy 0 - -  Change in appetite 0 - -  Feeling bad or failure about yourself  0 - -  Trouble concentrating 0 - -  Moving slowly or fidgety/restless 0 - -  Suicidal thoughts 0 - -  PHQ-9 Score 0 - -   ASTHMA Symptoms are well controlled Using medications without problems Night time symptoms:none Wheeze/SOB:none ER visits since last visit:none  Hypertension Using medications without difficulty Average home BPs Not checking  No problems or lightheadedness No chest pain with exertion or shortness of breath No Edema  Hyperlipidemia Using medications without problems: No Muscle aches  Diet compliance/Exercise  Relevant past medical, surgical, family and social  history reviewed and updated as indicated. Interim medical history since our last visit reviewed. Allergies and medications reviewed and updated.  Review of Systems  Constitutional: Negative.   HENT: Negative.   Eyes: Negative.   Respiratory: Negative.   Cardiovascular: Negative.   Gastrointestinal: Negative.   Endocrine: Negative.   Genitourinary: Negative.   Musculoskeletal:       Left wrist and right hip painful  Allergic/Immunologic: Negative.   Neurological: Negative.   Hematological: Negative.   Psychiatric/Behavioral: Negative.     Per HPI unless specifically indicated above     Objective:    BP 124/77 (BP Location: Left Arm, Patient Position: Sitting, Cuff Size: Normal)   Pulse 80   Temp 97.9 F (36.6 C)   Ht 5' 2.5" (1.588 m)   Wt 149 lb (67.6 kg)   LMP 07/31/1981 (Approximate)   SpO2 97%   BMI 26.82 kg/m   Wt Readings from Last 3 Encounters:  01/23/17 149 lb (67.6 kg)  11/06/16 150 lb 9.6 oz (68.3 kg)  05/05/16 148 lb 9.6 oz (67.4 kg)    Physical Exam  Constitutional: She is oriented to person, place, and time. She appears well-developed and well-nourished.  HENT:  Head: Normocephalic and atraumatic.  Eyes: Pupils are equal, round, and reactive to light. Right eye exhibits no discharge. Left eye exhibits no discharge. No scleral icterus.  Neck: Normal range of motion. Neck supple. Carotid bruit is not present. No thyromegaly present.  Cardiovascular: Normal rate, regular rhythm and normal heart sounds.  Exam reveals no gallop and no friction rub.   No murmur heard. Pulmonary/Chest: Effort normal and breath sounds normal. No respiratory distress. She has no wheezes. She has no rales.  Abdominal: Soft. Bowel sounds are normal. There is no tenderness. There is no rebound.  Genitourinary: No breast swelling, tenderness or discharge.  Musculoskeletal: Normal range of motion.  Lymphadenopathy:    She has no cervical adenopathy.  Neurological: She is alert and  oriented to person, place, and time.  Skin: Skin is warm, dry and intact. No rash noted.  Psychiatric: She has a normal mood and affect. Her speech is normal and behavior is normal. Judgment and thought content normal. Cognition and memory are normal.     Assessment & Plan:   Problem List Items Addressed This Visit      Unprioritized   Allergic rhinitis    Stable, continue present medications.        Asthma    Stable, continue present medications.        Benign essential HTN    Stable, continue present medications.        Relevant Orders   Comprehensive metabolic panel   Hyperlipidemia    Check lipid panel      Relevant Orders   Lipid Panel w/o Chol/HDL Ratio    Other Visit Diagnoses    Annual physical exam    -  Primary   Encounter for screening mammogram for breast cancer       Relevant Orders   MM DIGITAL SCREENING BILATERAL       Follow up plan: Return in about 6 months (around 07/23/2017).

## 2017-01-23 NOTE — Assessment & Plan Note (Signed)
Check lipid panel  

## 2017-01-24 ENCOUNTER — Encounter: Payer: Self-pay | Admitting: Unknown Physician Specialty

## 2017-01-24 DIAGNOSIS — J301 Allergic rhinitis due to pollen: Secondary | ICD-10-CM | POA: Diagnosis not present

## 2017-01-24 LAB — COMPREHENSIVE METABOLIC PANEL
A/G RATIO: 1.5 (ref 1.2–2.2)
ALT: 12 IU/L (ref 0–32)
AST: 21 IU/L (ref 0–40)
Albumin: 4 g/dL (ref 3.5–4.8)
Alkaline Phosphatase: 57 IU/L (ref 39–117)
BUN/Creatinine Ratio: 11 — ABNORMAL LOW (ref 12–28)
BUN: 11 mg/dL (ref 8–27)
Bilirubin Total: 0.2 mg/dL (ref 0.0–1.2)
CALCIUM: 9.6 mg/dL (ref 8.7–10.3)
CO2: 27 mmol/L (ref 18–29)
Chloride: 97 mmol/L (ref 96–106)
Creatinine, Ser: 0.99 mg/dL (ref 0.57–1.00)
GFR calc Af Amer: 67 mL/min/{1.73_m2} (ref >59)
GFR, EST NON AFRICAN AMERICAN: 58 mL/min/{1.73_m2} — AB (ref >59)
GLUCOSE: 82 mg/dL (ref 65–99)
Globulin, Total: 2.7 g/dL (ref 1.5–4.5)
POTASSIUM: 4.2 mmol/L (ref 3.5–5.2)
Sodium: 139 mmol/L (ref 134–144)
Total Protein: 6.7 g/dL (ref 6.0–8.5)

## 2017-01-24 LAB — LIPID PANEL W/O CHOL/HDL RATIO
Cholesterol, Total: 195 mg/dL (ref 100–199)
HDL: 76 mg/dL (ref >39)
LDL Calculated: 98 mg/dL (ref 0–99)
TRIGLYCERIDES: 106 mg/dL (ref 0–149)
VLDL Cholesterol Cal: 21 mg/dL (ref 5–40)

## 2017-01-25 DIAGNOSIS — Z1231 Encounter for screening mammogram for malignant neoplasm of breast: Secondary | ICD-10-CM | POA: Diagnosis not present

## 2017-01-29 DIAGNOSIS — J301 Allergic rhinitis due to pollen: Secondary | ICD-10-CM | POA: Diagnosis not present

## 2017-01-30 ENCOUNTER — Other Ambulatory Visit: Payer: Self-pay | Admitting: Unknown Physician Specialty

## 2017-02-05 DIAGNOSIS — J301 Allergic rhinitis due to pollen: Secondary | ICD-10-CM | POA: Diagnosis not present

## 2017-02-07 DIAGNOSIS — R928 Other abnormal and inconclusive findings on diagnostic imaging of breast: Secondary | ICD-10-CM | POA: Diagnosis not present

## 2017-02-08 ENCOUNTER — Telehealth: Payer: Self-pay | Admitting: Unknown Physician Specialty

## 2017-02-08 NOTE — Telephone Encounter (Signed)
Routing to provider. Patient requests to have 90 day supply sent in so pharmacy will pay.

## 2017-02-08 NOTE — Telephone Encounter (Signed)
ProAir was only written for 1 but her insurance pays for 90 supply so she is requesting that a new script been sent to [facility]  Thanks

## 2017-02-09 MED ORDER — ALBUTEROL SULFATE HFA 108 (90 BASE) MCG/ACT IN AERS
2.0000 | INHALATION_SPRAY | Freq: Four times a day (QID) | RESPIRATORY_TRACT | 3 refills | Status: DC | PRN
Start: 2017-02-09 — End: 2019-04-17

## 2017-02-12 DIAGNOSIS — J301 Allergic rhinitis due to pollen: Secondary | ICD-10-CM | POA: Diagnosis not present

## 2017-02-19 DIAGNOSIS — J301 Allergic rhinitis due to pollen: Secondary | ICD-10-CM | POA: Diagnosis not present

## 2017-03-05 DIAGNOSIS — J301 Allergic rhinitis due to pollen: Secondary | ICD-10-CM | POA: Diagnosis not present

## 2017-03-12 DIAGNOSIS — J301 Allergic rhinitis due to pollen: Secondary | ICD-10-CM | POA: Diagnosis not present

## 2017-03-19 DIAGNOSIS — J301 Allergic rhinitis due to pollen: Secondary | ICD-10-CM | POA: Diagnosis not present

## 2017-03-26 DIAGNOSIS — J301 Allergic rhinitis due to pollen: Secondary | ICD-10-CM | POA: Diagnosis not present

## 2017-03-29 ENCOUNTER — Other Ambulatory Visit: Payer: Self-pay | Admitting: Unknown Physician Specialty

## 2017-04-02 DIAGNOSIS — J301 Allergic rhinitis due to pollen: Secondary | ICD-10-CM | POA: Diagnosis not present

## 2017-04-06 DIAGNOSIS — J301 Allergic rhinitis due to pollen: Secondary | ICD-10-CM | POA: Diagnosis not present

## 2017-04-09 ENCOUNTER — Other Ambulatory Visit: Payer: Self-pay | Admitting: Family Medicine

## 2017-04-09 DIAGNOSIS — J301 Allergic rhinitis due to pollen: Secondary | ICD-10-CM | POA: Diagnosis not present

## 2017-04-09 NOTE — Telephone Encounter (Signed)
Your patient.  Thanks 

## 2017-04-12 ENCOUNTER — Other Ambulatory Visit: Payer: Self-pay | Admitting: Unknown Physician Specialty

## 2017-04-16 DIAGNOSIS — J301 Allergic rhinitis due to pollen: Secondary | ICD-10-CM | POA: Diagnosis not present

## 2017-04-16 IMAGING — CR DG CHEST 2V
2 series · 2 of 2 positions shown · non-contrast
Comparison: CT scan of the chest November 22, 2013

CLINICAL DATA: Cough and dyspnea, history of asthma.

EXAM:
CHEST  2 VIEW

[chest pa]
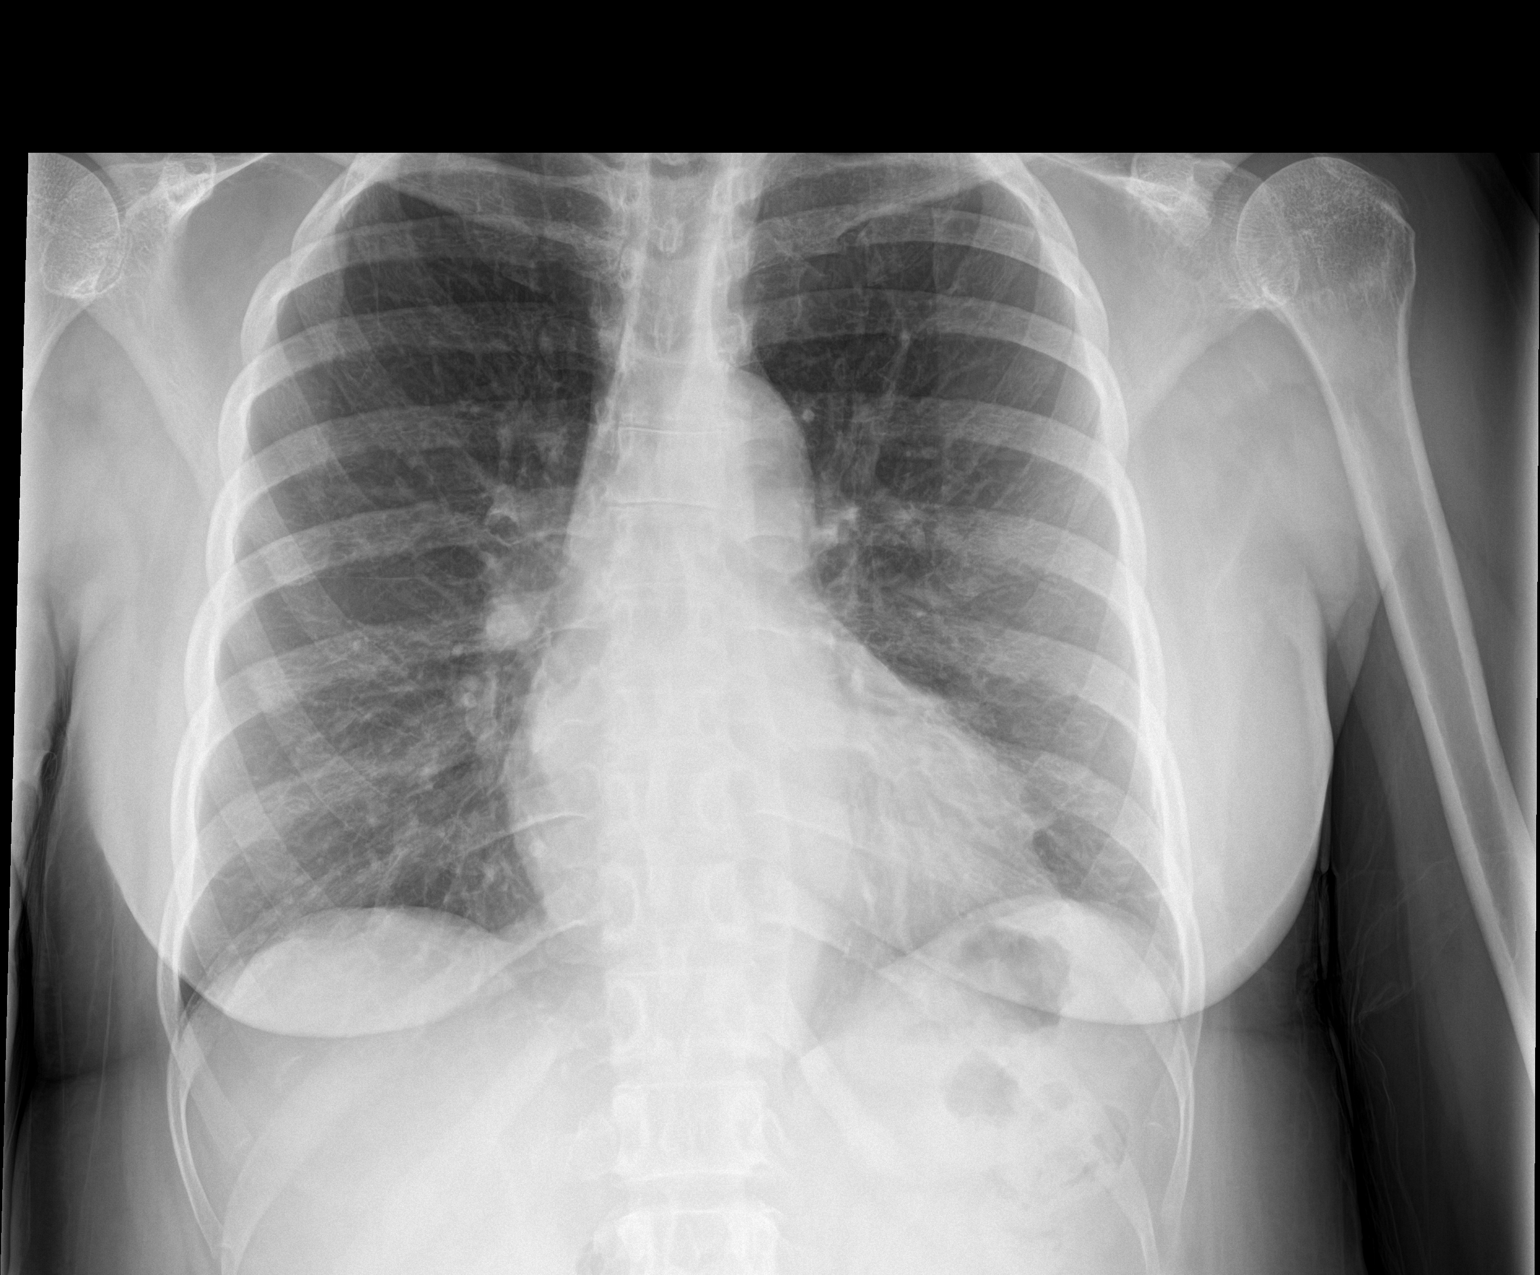

[chest lat]
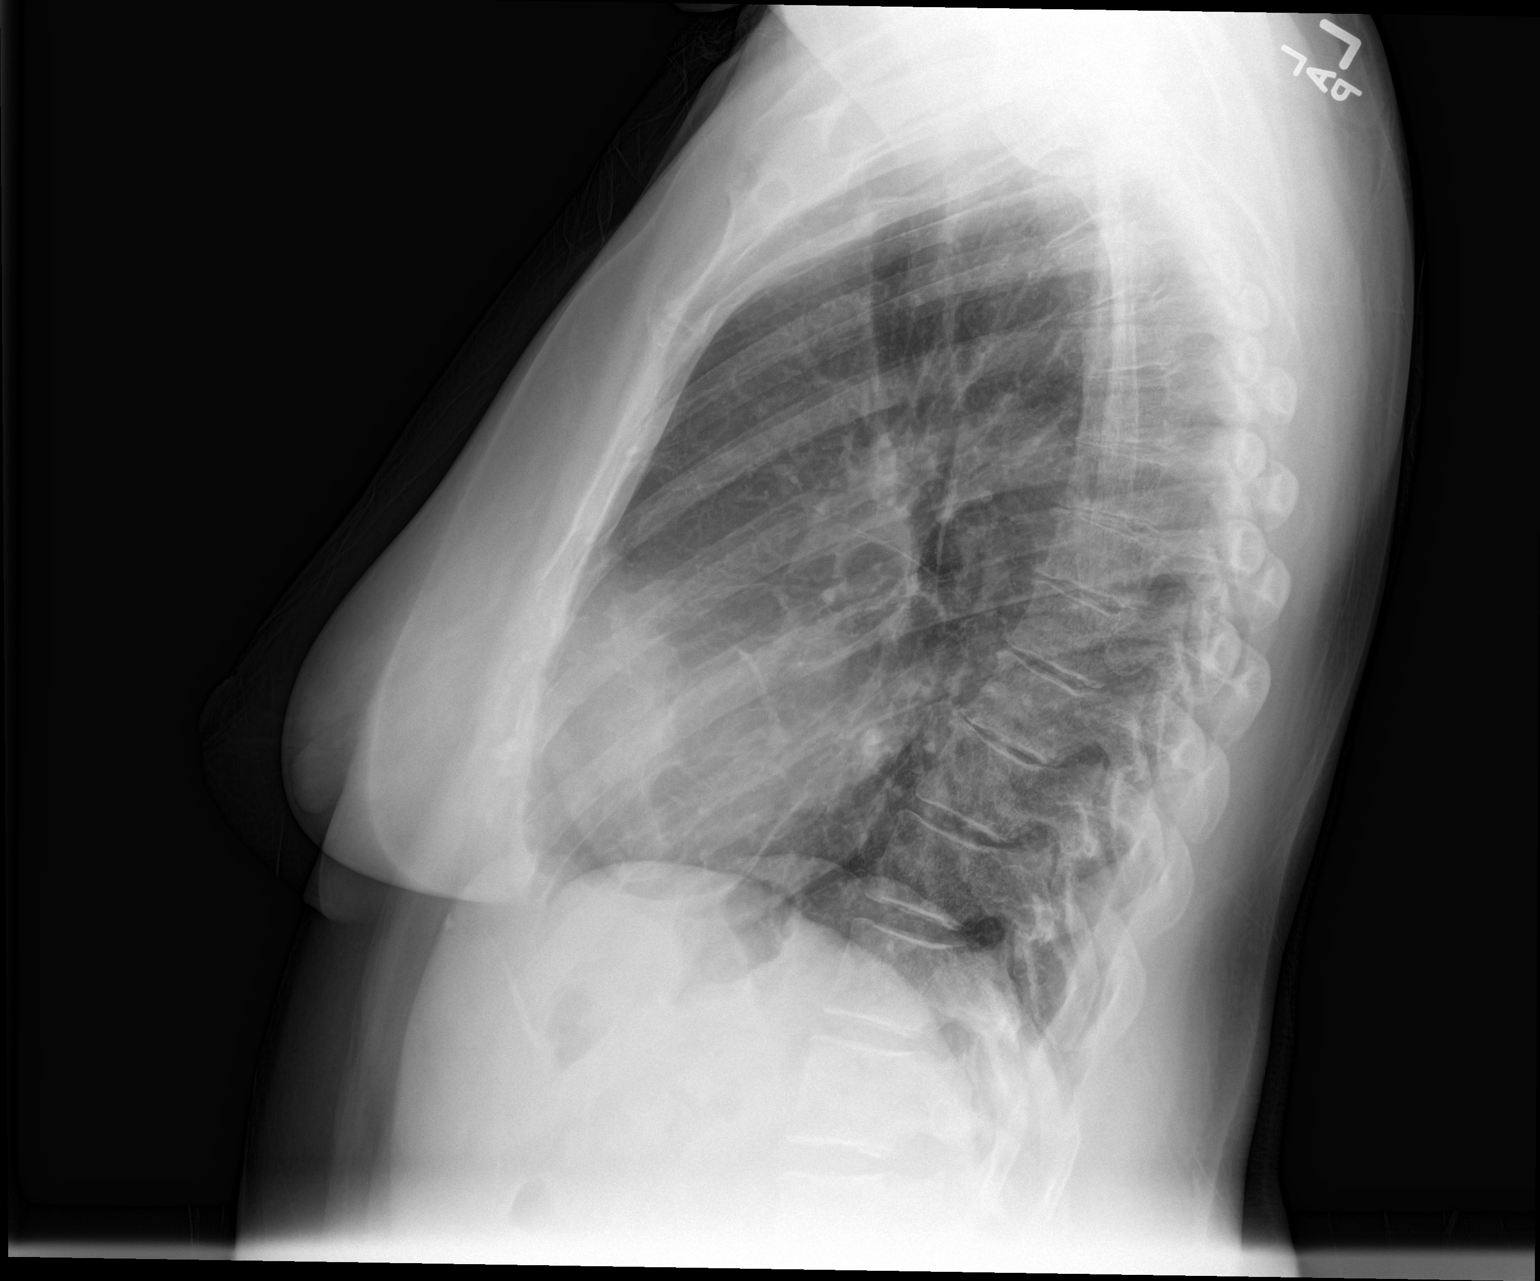

[2 of 2 positions shown; findings below may reference images not displayed]

FINDINGS: The lungs are well-expanded. The interstitial markings are coarse.
There is no alveolar infiltrate or pulmonary mass. There is no
pleural effusion. The heart and pulmonary vascularity are normal.
The mediastinum is normal in width. There is mild multilevel
degenerative disc space narrowing of the thoracic spine.
IMPRESSION: COPD -asthma with superimposed acute bronchitic changes. There is no
alveolar pneumonia nor CHF.

## 2017-04-23 ENCOUNTER — Encounter: Payer: Self-pay | Admitting: Unknown Physician Specialty

## 2017-04-23 ENCOUNTER — Ambulatory Visit (INDEPENDENT_AMBULATORY_CARE_PROVIDER_SITE_OTHER): Payer: Medicare Other | Admitting: Unknown Physician Specialty

## 2017-04-23 VITALS — BP 151/76 | HR 101 | Temp 98.6°F | Wt 150.2 lb

## 2017-04-23 DIAGNOSIS — L568 Other specified acute skin changes due to ultraviolet radiation: Secondary | ICD-10-CM

## 2017-04-23 DIAGNOSIS — M7071 Other bursitis of hip, right hip: Secondary | ICD-10-CM | POA: Diagnosis not present

## 2017-04-23 DIAGNOSIS — J301 Allergic rhinitis due to pollen: Secondary | ICD-10-CM | POA: Diagnosis not present

## 2017-04-23 MED ORDER — TRIAMCINOLONE ACETONIDE 0.1 % EX CREA: 1.0000 "application " | TOPICAL_CREAM | Freq: Two times a day (BID) | CUTANEOUS | 0 refills | Status: DC

## 2017-04-23 MED ORDER — TRIAMCINOLONE ACETONIDE 40 MG/ML IJ SUSP
40.0000 mg | Freq: Once | INTRAMUSCULAR | Status: AC
  Administered 2017-04-23: 40 mg via INTRAMUSCULAR

## 2017-04-23 NOTE — Progress Notes (Signed)
BP (!) (P) 145/78 (BP Location: Left Arm, Cuff Size: Normal)   Pulse (!) 101   Temp 98.6 F (37 C)   Wt 150 lb 3.2 oz (68.1 kg)   LMP 07/31/1981 (Approximate)   SpO2 97%   BMI 27.03 kg/m    Subjective:    Patient ID: Nancy Mack, female    DOB: 08/23/1946, 71 y.o.   MRN: [id number]  HPI: Nancy Mack is a 71 y.o. female  Chief Complaint  Patient presents with  . Rash    pt states that she has a rash on the left side of her neck that first came up 3 months ago, states it went down and then came back up 2 Saturday's ago, pt states she feels like it is from the sun   . Hip Pain    pt states her right hip has been bothering her for a while    Rash Pt states she has trouble with neck "whelping" and "breaking out."  She was initially wearing a gold chain but has taken it off.    Right hip Pt states she has pain right lateral side.  I did a steroid shot in the past which helped.  States it hurts at night with lying on either side.  Sometimes while lying on back   Relevant past medical, surgical, family and social history reviewed and updated as indicated. Interim medical history since our last visit reviewed. Allergies and medications reviewed and updated.  Review of Systems  Per HPI unless specifically indicated above     Objective:    BP (!) (P) 145/78 (BP Location: Left Arm, Cuff Size: Normal)   Pulse (!) 101   Temp 98.6 F (37 C)   Wt 150 lb 3.2 oz (68.1 kg)   LMP 07/31/1981 (Approximate)   SpO2 97%   BMI 27.03 kg/m   Wt Readings from Last 3 Encounters:  04/23/17 150 lb 3.2 oz (68.1 kg)  01/23/17 149 lb (67.6 kg)  11/06/16 150 lb 9.6 oz (68.3 kg)    Physical Exam  Constitutional: She is oriented to person, place, and time. She appears well-developed and well-nourished. No distress.  HENT:  Head: Normocephalic and atraumatic.  Eyes: Conjunctivae and lids are normal. Right eye exhibits no discharge. Left eye exhibits no discharge. No scleral icterus.    Neck: Normal range of motion. Neck supple. No JVD present. Carotid bruit is not present.  Cardiovascular: Normal rate, regular rhythm and normal heart sounds.   Pulmonary/Chest: Effort normal and breath sounds normal.  Abdominal: Normal appearance. There is no splenomegaly or hepatomegaly.  Musculoskeletal: Normal range of motion.       Right hip: She exhibits tenderness and bony tenderness. She exhibits normal range of motion and normal strength.  Tender right lateral hip  Neurological: She is alert and oriented to person, place, and time.  Skin: Skin is intact.  Neck with rough skin.  No erythema.    Psychiatric: She has a normal mood and affect. Her behavior is normal. Judgment and thought content normal.   Patient was placed in side-lying position Point of maximum tenderness identified around greater trochanter.   After sterile prep, 1 cc of K40 with 9 cc's of Celtasone injected at point of maximum tenderness.   Patient tolerated the procedure well     Results for orders placed or performed in visit on 01/23/17  Comprehensive metabolic panel  Result Value Ref Range   Glucose 82 65 - 99 mg/dL   BUN  11 8 - 27 mg/dL   Creatinine, Ser 0.99 0.57 - 1.00 mg/dL   GFR calc non Af Amer 58 (L) >59 mL/min/1.73   GFR calc Af Amer 67 >59 mL/min/1.73   BUN/Creatinine Ratio 11 (L) 12 - 28   Sodium 139 134 - 144 mmol/L   Potassium 4.2 3.5 - 5.2 mmol/L   Chloride 97 96 - 106 mmol/L   CO2 27 18 - 29 mmol/L   Calcium 9.6 8.7 - 10.3 mg/dL   Total Protein 6.7 6.0 - 8.5 g/dL   Albumin 4.0 3.5 - 4.8 g/dL   Globulin, Total 2.7 1.5 - 4.5 g/dL   Albumin/Globulin Ratio 1.5 1.2 - 2.2   Bilirubin Total 0.2 0.0 - 1.2 mg/dL   Alkaline Phosphatase 57 39 - 117 IU/L   AST 21 0 - 40 IU/L   ALT 12 0 - 32 IU/L  Lipid Panel w/o Chol/HDL Ratio  Result Value Ref Range   Cholesterol, Total 195 100 - 199 mg/dL   Triglycerides 106 0 - 149 mg/dL   HDL 76 >39 mg/dL   VLDL Cholesterol Cal 21 5 - 40 mg/dL   LDL  Calculated 98 0 - 99 mg/dL      Assessment & Plan:   Problem List Items Addressed This Visit      Unprioritized   Bursitis of right hip   Relevant Medications   triamcinolone acetonide (KENALOG-40) injection 40 mg (Completed) (Start on 04/23/2017  4:00 PM)    Other Visit Diagnoses    Photosensitivity due to sun    -  Primary   Rx for Aristocort cream       Follow up plan: Return if symptoms worsen or fail to improve.

## 2017-04-30 DIAGNOSIS — J301 Allergic rhinitis due to pollen: Secondary | ICD-10-CM | POA: Diagnosis not present

## 2017-05-07 ENCOUNTER — Other Ambulatory Visit: Payer: Self-pay | Admitting: Family Medicine

## 2017-05-07 DIAGNOSIS — J301 Allergic rhinitis due to pollen: Secondary | ICD-10-CM | POA: Diagnosis not present

## 2017-05-07 NOTE — Telephone Encounter (Signed)
Last filled by Dr. [name]. PCP is Kathrine Haddock.   Routing to PCP.

## 2017-05-10 ENCOUNTER — Other Ambulatory Visit: Payer: Self-pay | Admitting: Unknown Physician Specialty

## 2017-05-21 DIAGNOSIS — J301 Allergic rhinitis due to pollen: Secondary | ICD-10-CM | POA: Diagnosis not present

## 2017-05-24 DIAGNOSIS — J301 Allergic rhinitis due to pollen: Secondary | ICD-10-CM | POA: Diagnosis not present

## 2017-05-28 DIAGNOSIS — J301 Allergic rhinitis due to pollen: Secondary | ICD-10-CM | POA: Diagnosis not present

## 2017-06-04 DIAGNOSIS — J301 Allergic rhinitis due to pollen: Secondary | ICD-10-CM | POA: Diagnosis not present

## 2017-06-11 DIAGNOSIS — J301 Allergic rhinitis due to pollen: Secondary | ICD-10-CM | POA: Diagnosis not present

## 2017-06-18 DIAGNOSIS — J301 Allergic rhinitis due to pollen: Secondary | ICD-10-CM | POA: Diagnosis not present

## 2017-06-22 DIAGNOSIS — J301 Allergic rhinitis due to pollen: Secondary | ICD-10-CM | POA: Diagnosis not present

## 2017-06-25 DIAGNOSIS — J301 Allergic rhinitis due to pollen: Secondary | ICD-10-CM | POA: Diagnosis not present

## 2017-07-04 DIAGNOSIS — J301 Allergic rhinitis due to pollen: Secondary | ICD-10-CM | POA: Diagnosis not present

## 2017-07-09 DIAGNOSIS — J301 Allergic rhinitis due to pollen: Secondary | ICD-10-CM | POA: Diagnosis not present

## 2017-07-16 DIAGNOSIS — J301 Allergic rhinitis due to pollen: Secondary | ICD-10-CM | POA: Diagnosis not present

## 2017-07-23 DIAGNOSIS — J301 Allergic rhinitis due to pollen: Secondary | ICD-10-CM | POA: Diagnosis not present

## 2017-07-25 ENCOUNTER — Encounter: Payer: Self-pay | Admitting: Unknown Physician Specialty

## 2017-07-25 ENCOUNTER — Ambulatory Visit (INDEPENDENT_AMBULATORY_CARE_PROVIDER_SITE_OTHER): Payer: Medicare Other | Admitting: Unknown Physician Specialty

## 2017-07-25 VITALS — BP 133/81 | HR 80 | Temp 98.3°F | Ht 62.7 in | Wt 154.0 lb

## 2017-07-25 DIAGNOSIS — M79674 Pain in right toe(s): Secondary | ICD-10-CM | POA: Diagnosis not present

## 2017-07-25 DIAGNOSIS — E782 Mixed hyperlipidemia: Secondary | ICD-10-CM

## 2017-07-25 DIAGNOSIS — E663 Overweight: Secondary | ICD-10-CM | POA: Diagnosis not present

## 2017-07-25 DIAGNOSIS — I10 Essential (primary) hypertension: Secondary | ICD-10-CM

## 2017-07-25 DIAGNOSIS — J45909 Unspecified asthma, uncomplicated: Secondary | ICD-10-CM

## 2017-07-25 NOTE — Assessment & Plan Note (Signed)
Moderate restriction.  Encouraged to use inhaler

## 2017-07-25 NOTE — Assessment & Plan Note (Signed)
Discussed diet and exercise 

## 2017-07-25 NOTE — Assessment & Plan Note (Signed)
LDL is below 100 last visit.

## 2017-07-25 NOTE — Assessment & Plan Note (Signed)
Stable, continue present medications.   

## 2017-07-25 NOTE — Progress Notes (Signed)
BP 133/81   Pulse 80   Temp 98.3 F (36.8 C)   Ht 5' 2.7" (1.593 m)   Wt 154 lb (69.9 kg)   LMP 07/31/1981 (Approximate)   SpO2 94%   BMI 27.54 kg/m    Subjective:    Patient ID: Nancy Mack, female    DOB: 1946-03-29, 71 y.o.   MRN: [id number]  HPI: Nancy Mack is a 71 y.o. female  Chief Complaint  Patient presents with  . Hyperlipidemia  . Hypertension   Hypertension Using medications without difficulty Average home BPs   No problems or lightheadedness No chest pain with exertion or shortness of breath No Edema  Hyperlipidemia Using medications without problems: No Muscle aches  Diet compliance: 2 weeks eating salads Exercise: None  The 10-year ASCVD risk score Mikey Bussing DC Jr., et al., 2013) is: 12.9%   Values used to calculate the score:     Age: 71 years     Sex: Female     Is Non-Hispanic African American: Yes     Diabetic: No     Tobacco smoker: No     Systolic Blood Pressure: 601 mmHg     Is BP treated: Yes     HDL Cholesterol: 76 mg/dL     Total Cholesterol: 195 mg/dL  Toe pain Pt states on her right foot she gets a pain in her second toe right foot.  Hurts when she walks.    Asthma Using Proair quite a lot this year. Taking her Advair   Relevant past medical, surgical, family and social history reviewed and updated as indicated. Interim medical history since our last visit reviewed. Allergies and medications reviewed and updated.  Review of Systems  Per HPI unless specifically indicated above     Objective:    BP 133/81   Pulse 80   Temp 98.3 F (36.8 C)   Ht 5' 2.7" (1.593 m)   Wt 154 lb (69.9 kg)   LMP 07/31/1981 (Approximate)   SpO2 94%   BMI 27.54 kg/m   Wt Readings from Last 3 Encounters:  07/25/17 154 lb (69.9 kg)  04/23/17 150 lb 3.2 oz (68.1 kg)  01/23/17 149 lb (67.6 kg)    Physical Exam  Constitutional: She is oriented to person, place, and time. She appears well-developed and well-nourished.  HENT:  Head:  Normocephalic and atraumatic.  Eyes: Pupils are equal, round, and reactive to light. Right eye exhibits no discharge. Left eye exhibits no discharge. No scleral icterus.  Neck: Normal range of motion. Neck supple. Carotid bruit is not present. No thyromegaly present.  Cardiovascular: Normal rate, regular rhythm and normal heart sounds.  Exam reveals no gallop and no friction rub.   No murmur heard. Pulmonary/Chest: Effort normal and breath sounds normal. No respiratory distress. She has no wheezes. She has no rales.  Abdominal: Soft. Bowel sounds are normal. There is no tenderness. There is no rebound.  Genitourinary: No breast swelling, tenderness or discharge.  Musculoskeletal: Normal range of motion.  Lymphadenopathy:    She has no cervical adenopathy.  Neurological: She is alert and oriented to person, place, and time.  Skin: Skin is warm, dry and intact. No rash noted.  Psychiatric: She has a normal mood and affect. Her speech is normal and behavior is normal. Judgment and thought content normal. Cognition and memory are normal.    Results for orders placed or performed in visit on 01/23/17  Comprehensive metabolic panel  Result Value Ref Range   Glucose  82 65 - 99 mg/dL   BUN 11 8 - 27 mg/dL   Creatinine, Ser 0.99 0.57 - 1.00 mg/dL   GFR calc non Af Amer 58 (L) >59 mL/min/1.73   GFR calc Af Amer 67 >59 mL/min/1.73   BUN/Creatinine Ratio 11 (L) 12 - 28   Sodium 139 134 - 144 mmol/L   Potassium 4.2 3.5 - 5.2 mmol/L   Chloride 97 96 - 106 mmol/L   CO2 27 18 - 29 mmol/L   Calcium 9.6 8.7 - 10.3 mg/dL   Total Protein 6.7 6.0 - 8.5 g/dL   Albumin 4.0 3.5 - 4.8 g/dL   Globulin, Total 2.7 1.5 - 4.5 g/dL   Albumin/Globulin Ratio 1.5 1.2 - 2.2   Bilirubin Total 0.2 0.0 - 1.2 mg/dL   Alkaline Phosphatase 57 39 - 117 IU/L   AST 21 0 - 40 IU/L   ALT 12 0 - 32 IU/L  Lipid Panel w/o Chol/HDL Ratio  Result Value Ref Range   Cholesterol, Total 195 100 - 199 mg/dL   Triglycerides 106 0 -  149 mg/dL   HDL 76 >39 mg/dL   VLDL Cholesterol Cal 21 5 - 40 mg/dL   LDL Calculated 98 0 - 99 mg/dL      Assessment & Plan:   Problem List Items Addressed This Visit      Unprioritized   Asthma    Moderate restriction.  Encouraged to use inhaler       Relevant Orders   Spirometry with Graph (Completed)   Benign essential HTN    Stable, continue present medications.        Hyperlipidemia    LDL is below 100 last visit.        Overweight    Discussed diet and exercise       Other Visit Diagnoses    Pain of toe of right foot    -  Primary   Relevant Orders   Ambulatory referral to Podiatry       Follow up plan: Return in about 6 months (around 01/25/2018) for wellness and physical.

## 2017-07-30 DIAGNOSIS — J301 Allergic rhinitis due to pollen: Secondary | ICD-10-CM | POA: Diagnosis not present

## 2017-08-06 DIAGNOSIS — J301 Allergic rhinitis due to pollen: Secondary | ICD-10-CM | POA: Diagnosis not present

## 2017-08-13 DIAGNOSIS — J301 Allergic rhinitis due to pollen: Secondary | ICD-10-CM | POA: Diagnosis not present

## 2017-08-22 DIAGNOSIS — J301 Allergic rhinitis due to pollen: Secondary | ICD-10-CM | POA: Diagnosis not present

## 2017-08-23 ENCOUNTER — Ambulatory Visit (INDEPENDENT_AMBULATORY_CARE_PROVIDER_SITE_OTHER): Payer: Medicare Other

## 2017-08-23 ENCOUNTER — Other Ambulatory Visit: Payer: Self-pay | Admitting: Podiatry

## 2017-08-23 ENCOUNTER — Encounter: Payer: Self-pay | Admitting: Podiatry

## 2017-08-23 ENCOUNTER — Ambulatory Visit (INDEPENDENT_AMBULATORY_CARE_PROVIDER_SITE_OTHER): Payer: Medicare Other | Admitting: Podiatry

## 2017-08-23 VITALS — BP 131/78 | HR 74

## 2017-08-23 DIAGNOSIS — G5761 Lesion of plantar nerve, right lower limb: Secondary | ICD-10-CM

## 2017-08-23 DIAGNOSIS — D3610 Benign neoplasm of peripheral nerves and autonomic nervous system, unspecified: Secondary | ICD-10-CM

## 2017-08-23 DIAGNOSIS — R52 Pain, unspecified: Secondary | ICD-10-CM | POA: Diagnosis not present

## 2017-08-23 NOTE — Patient Instructions (Signed)
Morton Neuralgia Morton neuralgia is a type of foot pain in the area closest to your toes. This area is sometimes called the ball of your foot. Morton neuralgia occurs when a branch of a nerve in your foot (digital nerve) becomes compressed. When this happens over a long period of time, the nerve can thicken (neuroma) and cause pain. This usually occurs between the third and fourth toe. Morton neuralgia can come and go but may get worse over time. What are the causes? Your digital nerve can become compressed and stretched at a point where it passes under a thick band of tissue that connects your toes (intermetatarsal ligament). Morton neuralgia can be caused by mild repetitive damage in this area. This type of damage can result from:  Activities such as running or jumping.  Wearing shoes that are too tight.  What increases the risk? You may be at risk for Morton neuralgia if you:  Are female.  Wear high heels.  Wear shoes that are narrow or tight.  Participate in activities that stretch your toes. These include: ? Running. ? Ballet. ? Long-distance walking.  What are the signs or symptoms? The first symptom of Morton neuralgia is pain that spreads from the ball of your foot to your toes. It may feel like you are walking on a marble. Pain usually gets worse with walking and goes away at night. Other symptoms may include numbness and cramping of your toes. How is this diagnosed? Your health care provider will do a physical exam. When doing the exam, your health care provider may:  Squeeze your foot just behind your toe.  Ask you to move your toes to check for pain.  You may also have tests on your foot to confirm the diagnosis. These may include:  An X-ray.  An MRI.  How is this treated? Treatment for Morton neuralgia may be as simple as changing the kind of shoes you wear. Other treatments may include:  Wearing a supportive pad (orthosis) under the front of your foot. This  lifts your toe bones and takes pressure off the nerve.  Getting injections of numbing medicine and anti-inflammatory medicine (steroid) in the nerve.  Having surgery to remove part of the thickened nerve.  Follow these instructions at home:  Take medicine only as directed by your health care provider.  Wear soft-soled shoes with a wide toe area.  Stop activities that may be causing pain.  Elevate your foot when resting.  Massage your foot.  Apply ice to the injured area: ? Put ice in a plastic bag. ? Place a towel between your skin and the bag. ? Leave the ice on for 20 minutes, 2-3 times a day.  Keep all follow-up visits as directed by your health care provider. This is important. Contact a health care provider if:  Home care instructions are not helping you get better.  Your symptoms change or get worse. This information is not intended to replace advice given to you by your health care provider. Make sure you discuss any questions you have with your health care provider. Document Released: 03/12/2001 Document Revised: 05/11/2016 Document Reviewed: 02/04/2014 Elsevier Interactive Patient Education  2018 [employer] Inc[employer]  

## 2017-08-23 NOTE — Progress Notes (Signed)
   Subjective:    Patient ID: Nancy Mack, female    DOB: 27-Aug-1946, 71 y.o.   MRN: [id number]  HPI this patient presents to the office with chief complaint of pain that extends into her second, third and fourth toes, right foot..  She says she has been experiencing this pain for 5-6 months.  She says she feels numbness and tingling extending into the second, third and fourth toes, right foot.  No symptoms left foot. Patient denies any history of trauma or injury to the foot. She has provided no self treatment nor sought any professional help.  She does have a history of surgery in her big toe joint, right foot and her outside right forefoot.  She says that she has been wearing her some her shoes and has orthotics at home which she puts in her sneakers.  She presents the office today for an evaluation and treatment of her right foot    Review of Systems  All other systems reviewed and are negative.      Objective:   Physical Exam GENERAL APPEARANCE: Alert, conversant. Appropriately groomed. No acute distress.  VASCULAR: Pedal pulses are  palpable at  [facility] and PT bilateral.  Capillary refill time is immediate to all digits,  Normal temperature gradient.  Digital hair growth is present bilateral  NEUROLOGIC: sensation is normal to 5.07 monofilament at 5/5 sites bilateral.  Light touch is intact bilateral, Muscle strength normal.  MUSCULOSKELETAL: acceptable muscle strength, tone and stability bilateral.  Intrinsic muscluature intact bilateral.  Rectus appearance of foot and digits noted bilateral. 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  palpable pain noted in the second and third interspace of the right foot.  Patient has significant hypermobility in both forefeet.  DERMATOLOGIC: skin color, texture, and turgor are within normal limits.  No preulcerative lesions or ulcers  are seen, no interdigital maceration noted.  No open lesions present.  Digital nails are asymptomatic. No drainage noted.           Assessment & Plan:  Eden Lathe neuroma 2,3 right foot    Initial exam.  X-rays were taken which do reveal previous surgery to the first metatarsal fifth metatarsal and fifth toe right foot.  No bony pathology noted in her metatarsals 2, 3, 4.  Patient was diagnosed with neuromas in her right foot, which I suggested she start wearing her orthotics until her pain is resolved.  She was also treated with injection therapy in the second and third interspace of the right foot.  Return to the clinic in 3-4 weeks for further evaluation and treatment   Gardiner Barefoot DPM

## 2017-08-27 DIAGNOSIS — J301 Allergic rhinitis due to pollen: Secondary | ICD-10-CM | POA: Diagnosis not present

## 2017-09-03 DIAGNOSIS — J301 Allergic rhinitis due to pollen: Secondary | ICD-10-CM | POA: Diagnosis not present

## 2017-09-06 ENCOUNTER — Ambulatory Visit (INDEPENDENT_AMBULATORY_CARE_PROVIDER_SITE_OTHER): Payer: Medicare Other | Admitting: Podiatry

## 2017-09-06 ENCOUNTER — Encounter: Payer: Self-pay | Admitting: Podiatry

## 2017-09-06 DIAGNOSIS — D3610 Benign neoplasm of peripheral nerves and autonomic nervous system, unspecified: Secondary | ICD-10-CM | POA: Diagnosis not present

## 2017-09-06 NOTE — Progress Notes (Signed)
Patient returns to the office follow-up for diagnosis of a neuroma in the second and third interspace of her right foot.  Patient has significant hypermobility noted to her foot and previous bunion surgery right foot.  She says she only has had minimal relief from the previous injection.  She presents the office today for continued evaluation and treatment of her neuromas, right foot. She does present to the office her orthotics which she received in her 53s and needs orthotics do not fit her feet at this visit.  GENERAL APPEARANCE: Alert, conversant. Appropriately groomed. No acute distress.  VASCULAR: Pedal pulses are  palpable at  [facility] and PT bilateral.  Capillary refill time is immediate to all digits,  Normal temperature gradient.  Digital hair growth is present bilateral  NEUROLOGIC: sensation is normal to 5.07 monofilament at 5/5 sites bilateral.  Light touch is intact bilateral, Muscle strength normal.  MUSCULOSKELETAL: acceptable muscle strength, tone and stability bilateral.  Mild HAV deformity first MPJ bilaterally.   Patient has continued. Palpable pain noted in the second and third interspace of the right foot.  Significant hypermobility noted.    DERMATOLOGIC: skin color, texture, and turgor are within normal limits.  No preulcerative lesions or ulcers  are seen, no interdigital maceration noted.  No open lesions present.  Digital nails are asymptomatic. No drainage noted.  Neuroma 2,3 right foot.   Return office visit.  We inject with cortisone in the second and third interspace right foot.  I noted her shoes which had no support. Full-thickness to resolve  . Therefore, I recommended that she ambulate with a Darco shoe for the next 2 weeks in an effort to help to prevent excessive hypermobility motion.   Gardiner Barefoot DPM.

## 2017-09-06 NOTE — Patient Instructions (Signed)
Morton Neuralgia Morton neuralgia is a type of foot pain in the area closest to your toes. This area is sometimes called the ball of your foot. Morton neuralgia occurs when a branch of a nerve in your foot (digital nerve) becomes compressed. When this happens over a long period of time, the nerve can thicken (neuroma) and cause pain. This usually occurs between the third and fourth toe. Morton neuralgia can come and go but may get worse over time. What are the causes? Your digital nerve can become compressed and stretched at a point where it passes under a thick band of tissue that connects your toes (intermetatarsal ligament). Morton neuralgia can be caused by mild repetitive damage in this area. This type of damage can result from:  Activities such as running or jumping.  Wearing shoes that are too tight.  What increases the risk? You may be at risk for Morton neuralgia if you:  Are female.  Wear high heels.  Wear shoes that are narrow or tight.  Participate in activities that stretch your toes. These include: ? Running. ? Ballet. ? Long-distance walking.  What are the signs or symptoms? The first symptom of Morton neuralgia is pain that spreads from the ball of your foot to your toes. It may feel like you are walking on a marble. Pain usually gets worse with walking and goes away at night. Other symptoms may include numbness and cramping of your toes. How is this diagnosed? Your health care provider will do a physical exam. When doing the exam, your health care provider may:  Squeeze your foot just behind your toe.  Ask you to move your toes to check for pain.  You may also have tests on your foot to confirm the diagnosis. These may include:  An X-ray.  An MRI.  How is this treated? Treatment for Morton neuralgia may be as simple as changing the kind of shoes you wear. Other treatments may include:  Wearing a supportive pad (orthosis) under the front of your foot. This  lifts your toe bones and takes pressure off the nerve.  Getting injections of numbing medicine and anti-inflammatory medicine (steroid) in the nerve.  Having surgery to remove part of the thickened nerve.  Follow these instructions at home:  Take medicine only as directed by your health care provider.  Wear soft-soled shoes with a wide toe area.  Stop activities that may be causing pain.  Elevate your foot when resting.  Massage your foot.  Apply ice to the injured area: ? Put ice in a plastic bag. ? Place a towel between your skin and the bag. ? Leave the ice on for 20 minutes, 2-3 times a day.  Keep all follow-up visits as directed by your health care provider. This is important. Contact a health care provider if:  Home care instructions are not helping you get better.  Your symptoms change or get worse. This information is not intended to replace advice given to you by your health care provider. Make sure you discuss any questions you have with your health care provider. Document Released: 03/12/2001 Document Revised: 05/11/2016 Document Reviewed: 02/04/2014 Elsevier Interactive Patient Education  2018 [employer] Inc[employer]  

## 2017-09-10 DIAGNOSIS — J301 Allergic rhinitis due to pollen: Secondary | ICD-10-CM | POA: Diagnosis not present

## 2017-09-17 DIAGNOSIS — J301 Allergic rhinitis due to pollen: Secondary | ICD-10-CM | POA: Diagnosis not present

## 2017-09-20 ENCOUNTER — Ambulatory Visit (INDEPENDENT_AMBULATORY_CARE_PROVIDER_SITE_OTHER): Payer: Self-pay | Admitting: Podiatry

## 2017-09-20 DIAGNOSIS — D3610 Benign neoplasm of peripheral nerves and autonomic nervous system, unspecified: Secondary | ICD-10-CM

## 2017-09-20 NOTE — Progress Notes (Signed)
.   This patient presents to the office for continued evaluation of her right foot.  She was diagnosed with neuromas in the second and third interspace of the right foot, which previously and treated with injection therapy and a Darco shoe.  She has significant hypermobility noted in her right forefoot.  She says that her right foot is better but the pain continues.  She returns the office today for continued evaluation and treatment of her painful right forefoot.  General Appearance  Alert, conversant and in no acute stress.  Vascular  Dorsalis pedis and posterior pulses are palpable  bilaterally.  Capillary return is within normal limits  Bilaterally. Temperature is within normal limits  Bilaterally  Neurologic  Senn-Weinstein monofilament wire test within normal limits  bilaterally. Muscle power  Within normal limits bilaterally.  Nails Thick disfigured discolored nails with subungual debride bilaterally from hallux to fifth toes bilaterally. No evidence of bacterial infection or drainage bilaterally.  Orthopedic  No limitations of motion of motion feet bilaterally.  No crepitus or effusions noted.  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  Palpable pain noted in the third interspace of the right foot.    Skin  normotropic skin with no porokeratosis noted bilaterally.  No signs of infections or ulcers noted.     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  Return office visit.  Patient states that her pain has improved since her last visit.  She was told that she can return to her regular foot wear instead of a Darco shoe.  I did recommend that we give her power step insoles in an effort to help to limit the hypermobility of the right foot and thus reduce her pain.  I did tell her that if the pain persists, we can try another form of injection treatment and/or send her to a surgeon for surgical consult.   Gardiner Barefoot DPM

## 2017-09-21 DIAGNOSIS — J301 Allergic rhinitis due to pollen: Secondary | ICD-10-CM | POA: Diagnosis not present

## 2017-09-24 DIAGNOSIS — J301 Allergic rhinitis due to pollen: Secondary | ICD-10-CM | POA: Diagnosis not present

## 2017-09-28 ENCOUNTER — Other Ambulatory Visit: Payer: Self-pay | Admitting: Family Medicine

## 2017-09-28 ENCOUNTER — Other Ambulatory Visit: Payer: Self-pay | Admitting: Unknown Physician Specialty

## 2017-10-01 DIAGNOSIS — J301 Allergic rhinitis due to pollen: Secondary | ICD-10-CM | POA: Diagnosis not present

## 2017-10-02 ENCOUNTER — Ambulatory Visit (INDEPENDENT_AMBULATORY_CARE_PROVIDER_SITE_OTHER): Payer: Medicare Other

## 2017-10-02 DIAGNOSIS — Z23 Encounter for immunization: Secondary | ICD-10-CM | POA: Diagnosis not present

## 2017-10-08 DIAGNOSIS — J301 Allergic rhinitis due to pollen: Secondary | ICD-10-CM | POA: Diagnosis not present

## 2017-10-15 DIAGNOSIS — J301 Allergic rhinitis due to pollen: Secondary | ICD-10-CM | POA: Diagnosis not present

## 2017-10-18 ENCOUNTER — Ambulatory Visit (INDEPENDENT_AMBULATORY_CARE_PROVIDER_SITE_OTHER): Payer: Medicare Other | Admitting: Podiatry

## 2017-10-18 ENCOUNTER — Encounter: Payer: Self-pay | Admitting: Podiatry

## 2017-10-18 DIAGNOSIS — R52 Pain, unspecified: Secondary | ICD-10-CM

## 2017-10-18 DIAGNOSIS — D3610 Benign neoplasm of peripheral nerves and autonomic nervous system, unspecified: Secondary | ICD-10-CM

## 2017-10-18 NOTE — Progress Notes (Signed)
.   This patient presents to the office for continued evaluation of her right foot.  She was diagnosed with neuromas in her third interspace right foot.  She says that her right foot still hurts even though she has been wearing the power step insoles.  She is also been treated with injection therapy, which did not provide significant relief.  She states today she is even finds herself walking on the outside of her right foot causing cramping in the big toe of the right foot.  She does have bunions on both feet.  She presents the office today for continued evaluation and treatment  General Appearance  Alert, conversant and in no acute stress.  Vascular  Dorsalis pedis and posterior pulses are palpable  bilaterally.  Capillary return is within normal limits  Bilaterally. Temperature is within normal limits  Bilaterally  Neurologic  Senn-Weinstein monofilament wire test within normal limits  bilaterally. Muscle power  Within normal limits bilaterally.  Nails Thick disfigured discolored nails with subungual debride bilaterally from hallux to fifth toes bilaterally. No evidence of bacterial infection or drainage bilaterally.  Orthopedic  No limitations of motion of motion feet bilaterally.  No crepitus or effusions noted.  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  Palpable pain noted in the third interspace of the right foot.    Skin  normotropic skin with no porokeratosis noted bilaterally.  No signs of infections or ulcers noted.     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  Return office visit.  Patient states that her pain has not improved even with the use of the power step insoles.  She says the pain continues to be painful walking and wearing her shoes.  At this juncture. I have tried clinical relief for her neuroma, but has not been successful in relieving her pain.  I am recommending she have a surgical consult by Dr. Milinda Pointer for the examination of her neuroma and possibly for her bunion, right foot   Gardiner Barefoot  DPM

## 2017-10-19 ENCOUNTER — Other Ambulatory Visit: Payer: Self-pay | Admitting: Unknown Physician Specialty

## 2017-10-22 ENCOUNTER — Other Ambulatory Visit: Payer: Self-pay

## 2017-10-22 DIAGNOSIS — J301 Allergic rhinitis due to pollen: Secondary | ICD-10-CM | POA: Diagnosis not present

## 2017-10-22 MED ORDER — AZELASTINE HCL 0.1 % NA SOLN: 1.0000 | Freq: Two times a day (BID) | NASAL | 2 refills | Status: DC

## 2017-10-22 NOTE — Telephone Encounter (Signed)
Patient last seen 07/25/17 and has f/up scheduled for 01/25/18.

## 2017-10-24 ENCOUNTER — Ambulatory Visit (INDEPENDENT_AMBULATORY_CARE_PROVIDER_SITE_OTHER): Payer: Medicare Other | Admitting: Podiatry

## 2017-10-24 ENCOUNTER — Encounter: Payer: Self-pay | Admitting: Podiatry

## 2017-10-24 DIAGNOSIS — D3610 Benign neoplasm of peripheral nerves and autonomic nervous system, unspecified: Secondary | ICD-10-CM | POA: Diagnosis not present

## 2017-10-24 NOTE — Progress Notes (Signed)
She presents today for a surgical consult regarding her right foot. She has been seen by Dr. Prudence Davidson in the past for multiple injections regarding a neuroma to the third interdigital space of the right foot. She states this thing going on for many months and she is tired of it and would like to have surgical correction. She states this seems to be affecting her ability to perform her everyday activities.  Objective: Vital signs are stable she is alert and oriented 3. Pulses are palpable. There is no erythema cellulitis drainage or odor that she does have a palpable click to the third interdigital space of the right foot.  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  Plan: At this point we went over a consent form today line by line number by number given her apple time to ask questions she saw fit regarding surgical excision of the neuroma. I answered all the questions regarding these procedures to the best of my ability in layman's terms. She understood this was amenable to it. We did also discuss the possibility of dehydrated alcohol injections which at this time she does not want have to do but she is tired of the wait for this to get better. We discussed the need for anesthesia as well as the surgery Center discussing that today she will bring her surgical shoe with her on the date of surgery. I will follow-up with her in the near future for surgery.

## 2017-10-24 NOTE — Patient Instructions (Signed)
Pre-Operative Instructions  Congratulations, you have decided to take an important step towards improving your quality of life.  You can be assured that the doctors and staff at [facility] will be with you every step of the way.  Here are some important things you should know:  1. Plan to be at the surgery center/hospital at least 1 (one) hour prior to your scheduled time, unless otherwise directed by the surgical center/hospital staff.  You must have a responsible adult accompany you, remain during the surgery and drive you home.  Make sure you have directions to the surgical center/hospital to ensure you arrive on time. 2. If you are having surgery at [facility] or [facility] hospitals, you will need a copy of your medical history and physical form from your family physician within one month prior to the date of surgery. We will give you a form for your primary physician to complete.  3. We make every effort to accommodate the date you request for surgery.  However, there are times where surgery dates or times have to be moved.  We will contact you as soon as possible if a change in schedule is required.   4. No aspirin/ibuprofen for one week before surgery.  If you are on aspirin, any non-steroidal anti-inflammatory medications (Mobic, Aleve, Ibuprofen) should not be taken seven (7) days prior to your surgery.  You make take Tylenol for pain prior to surgery.  5. Medications - If you are taking daily heart and blood pressure medications, seizure, reflux, allergy, asthma, anxiety, pain or diabetes medications, make sure you notify the surgery center/hospital before the day of surgery so they can tell you which medications you should take or avoid the day of surgery. 6. No food or drink after midnight the night before surgery unless directed otherwise by surgical center/hospital staff. 7. No alcoholic beverages 24-hours prior to surgery.  No smoking 24-hours prior or 24-hours after  surgery. 8. Wear loose pants or shorts. They should be loose enough to fit over bandages, boots, and casts. 9. Don't wear slip-on shoes. Sneakers are preferred. 10. Bring your boot with you to the surgery center/hospital.  Also bring crutches or a walker if your physician has prescribed it for you.  If you do not have this equipment, it will be provided for you after surgery. 11. If you have not been contacted by the surgery center/hospital by the day before your surgery, call to confirm the date and time of your surgery. 12. Leave-time from work may vary depending on the type of surgery you have.  Appropriate arrangements should be made prior to surgery with your employer. 13. Prescriptions will be provided immediately following surgery by your doctor.  Fill these as soon as possible after surgery and take the medication as directed. Pain medications will not be refilled on weekends and must be approved by the doctor. 14. Remove nail polish on the operative foot and avoid getting pedicures prior to surgery. 15. Wash the night before surgery.  The night before surgery wash the foot and leg well with water and the antibacterial soap provided. Be sure to pay special attention to beneath the toenails and in between the toes.  Wash for at least three (3) minutes. Rinse thoroughly with water and dry well with a towel.  Perform this wash unless told not to do so by your physician.  Enclosed: 1 Ice pack (please put in freezer the night before surgery)   1 Hibiclens skin cleaner     Pre-op instructions  If you have any questions regarding the instructions, please do not hesitate to call our office.  [place]: [address], [place], [place] [address] -- [phone]  [place]: [address] [place], [place] [address] -- [phone]  [place]: [address]  [place], [place] [address] -- [phone]  [place]: [address], [address], [place], [place] [address] -- [phone]  Website:  [email or web address] 

## 2017-10-25 ENCOUNTER — Telehealth: Payer: Self-pay | Admitting: *Deleted

## 2017-10-25 NOTE — Telephone Encounter (Signed)
"  I'm calling to schedule my surgery with Dr. Milinda Pointer.  I saw him yesterday and they told me to call you."  Do you have a date in mind?  "Whatever your first available is."  He can do it on November 30.  "That's soon.  Does he have anything else a little later?"  He can do it on December 21.  "I'll take it if that's all he has."  It's either that date or November 30.  "Go ahead and put me down for December 21.  What time?"  Someone from the surgical center will call you with the arrival time a day or two prior to your surgery date.  You can register with them if you have access to a computer if not, someone from the surgical center will call you to get the information they need.

## 2017-11-05 DIAGNOSIS — J301 Allergic rhinitis due to pollen: Secondary | ICD-10-CM | POA: Diagnosis not present

## 2017-11-12 DIAGNOSIS — J301 Allergic rhinitis due to pollen: Secondary | ICD-10-CM | POA: Diagnosis not present

## 2017-11-19 DIAGNOSIS — J301 Allergic rhinitis due to pollen: Secondary | ICD-10-CM | POA: Diagnosis not present

## 2017-12-03 DIAGNOSIS — J301 Allergic rhinitis due to pollen: Secondary | ICD-10-CM | POA: Diagnosis not present

## 2017-12-05 ENCOUNTER — Other Ambulatory Visit: Payer: Self-pay | Admitting: Podiatry

## 2017-12-05 MED ORDER — CEPHALEXIN 500 MG PO CAPS: 500.0000 mg | ORAL_CAPSULE | Freq: Three times a day (TID) | ORAL | 0 refills | Status: DC

## 2017-12-05 MED ORDER — PROMETHAZINE HCL 25 MG PO TABS: 25.0000 mg | ORAL_TABLET | Freq: Three times a day (TID) | ORAL | 0 refills | Status: DC | PRN

## 2017-12-05 MED ORDER — OXYCODONE-ACETAMINOPHEN 10-325 MG PO TABS: 1.0000 | ORAL_TABLET | ORAL | 0 refills | Status: DC | PRN

## 2017-12-07 ENCOUNTER — Encounter: Payer: Self-pay | Admitting: Podiatry

## 2017-12-07 DIAGNOSIS — M25571 Pain in right ankle and joints of right foot: Secondary | ICD-10-CM | POA: Diagnosis not present

## 2017-12-07 DIAGNOSIS — J45909 Unspecified asthma, uncomplicated: Secondary | ICD-10-CM | POA: Diagnosis not present

## 2017-12-07 DIAGNOSIS — G5761 Lesion of plantar nerve, right lower limb: Secondary | ICD-10-CM | POA: Diagnosis not present

## 2017-12-10 ENCOUNTER — Other Ambulatory Visit: Payer: Self-pay

## 2017-12-10 MED ORDER — ATORVASTATIN CALCIUM 20 MG PO TABS: 20.0000 mg | ORAL_TABLET | Freq: Every day | ORAL | 0 refills | Status: DC

## 2017-12-10 NOTE — Telephone Encounter (Signed)
Patient last seen 07/2017 and has f/up 01/2018.

## 2017-12-12 ENCOUNTER — Encounter: Payer: Self-pay | Admitting: Podiatry

## 2017-12-14 ENCOUNTER — Ambulatory Visit (INDEPENDENT_AMBULATORY_CARE_PROVIDER_SITE_OTHER): Payer: Medicare Other | Admitting: Podiatry

## 2017-12-14 ENCOUNTER — Encounter: Payer: Self-pay | Admitting: Podiatry

## 2017-12-14 VITALS — BP 175/86 | HR 74 | Temp 98.8°F | Resp 16

## 2017-12-14 DIAGNOSIS — Z9889 Other specified postprocedural states: Secondary | ICD-10-CM

## 2017-12-14 DIAGNOSIS — D3610 Benign neoplasm of peripheral nerves and autonomic nervous system, unspecified: Secondary | ICD-10-CM

## 2017-12-17 DIAGNOSIS — J301 Allergic rhinitis due to pollen: Secondary | ICD-10-CM | POA: Diagnosis not present

## 2017-12-20 NOTE — Progress Notes (Signed)
   Subjective:  Patient presents today status post neurectomy right foot. DOS: 12/07/17.  She states she is doing great.  She denies any pain, fever or chills.  Patient is here for further evaluation and treatment.   Past Medical History:  Diagnosis Date  . Allergy   . Asthma   . Carpal tunnel syndrome   . Eczema   . Globus pharyngeus   . Hyperlipidemia   . Hypertension   . Laryngopharyngeal reflux   . Menopause   . Osteoporosis   . Raynaud's disease   . Rhinitis due to pollen   . SOB (shortness of breath)       Objective/Physical Exam Skin incisions appear to be well coapted with sutures and staples intact. No sign of infectious process noted. No dehiscence. No active bleeding noted. Moderate edema noted to the surgical extremity.  Assessment: 1. s/p neurectomy right foot. DOS: 12/07/17   Plan of Care:  1. Patient was evaluated.  2.  Dressing change. 3.  Continue weightbearing in postop shoe. 4.  Return to clinic in 1 week for suture removal with Dr. Milinda Pointer.   Edrick Kins, DPM [facility]  Dr. Edrick Kins, [place]                                        [place], [place] [address]                Office [phone]  Fax [phone]

## 2017-12-21 DIAGNOSIS — J301 Allergic rhinitis due to pollen: Secondary | ICD-10-CM | POA: Diagnosis not present

## 2017-12-24 ENCOUNTER — Ambulatory Visit (INDEPENDENT_AMBULATORY_CARE_PROVIDER_SITE_OTHER): Payer: Medicare Other | Admitting: Podiatry

## 2017-12-24 ENCOUNTER — Encounter: Payer: Self-pay | Admitting: Podiatry

## 2017-12-24 DIAGNOSIS — Z9889 Other specified postprocedural states: Secondary | ICD-10-CM

## 2017-12-24 DIAGNOSIS — J301 Allergic rhinitis due to pollen: Secondary | ICD-10-CM | POA: Diagnosis not present

## 2017-12-24 DIAGNOSIS — D3610 Benign neoplasm of peripheral nerves and autonomic nervous system, unspecified: Secondary | ICD-10-CM | POA: Diagnosis not present

## 2017-12-24 NOTE — Progress Notes (Signed)
She presents today 2 weeks status post excision neuroma third interdigital space of the right foot.  She denies fever chills nausea vomiting muscle aches and pains.  Objective: Vital signs are stable alert and oriented times 3 sutures are intact margins well coapted sutures were removed today.  Assessment: Well-healing surgical foot l right.  Plan: I will allow her to start getting this foot wet and try to get back into a regular pair shoes and will follow up with her in 2 weeks.

## 2017-12-31 DIAGNOSIS — J301 Allergic rhinitis due to pollen: Secondary | ICD-10-CM | POA: Diagnosis not present

## 2018-01-07 ENCOUNTER — Encounter: Payer: Self-pay | Admitting: Podiatry

## 2018-01-07 ENCOUNTER — Ambulatory Visit (INDEPENDENT_AMBULATORY_CARE_PROVIDER_SITE_OTHER): Payer: Self-pay | Admitting: Podiatry

## 2018-01-07 DIAGNOSIS — D3610 Benign neoplasm of peripheral nerves and autonomic nervous system, unspecified: Secondary | ICD-10-CM

## 2018-01-07 DIAGNOSIS — J301 Allergic rhinitis due to pollen: Secondary | ICD-10-CM | POA: Diagnosis not present

## 2018-01-07 DIAGNOSIS — Z9889 Other specified postprocedural states: Secondary | ICD-10-CM

## 2018-01-07 NOTE — Progress Notes (Signed)
She presents today for follow-up of her neurectomy third interdigital space of the right foot.  States that he still feels a little tingly and numb at times but is doing pretty well.  Date of surgery 12/07/2017.  Objective: Vital signs are stable alert and oriented x3.  Pulses are palpable.  She has mild tenderness on palpation of the incision site which is going to heal quite nicely.  The proximal and of the incision site needs to heal a little more but seems to be doing very well.  I see no signs of infection.  Assessment: Well-healing surgical foot right.  Plan: Massage therapy was recommended as well as Mederma and bio oil.  I will follow-up with her in the near future for reevaluation.

## 2018-01-21 DIAGNOSIS — J301 Allergic rhinitis due to pollen: Secondary | ICD-10-CM | POA: Diagnosis not present

## 2018-01-25 ENCOUNTER — Ambulatory Visit (INDEPENDENT_AMBULATORY_CARE_PROVIDER_SITE_OTHER): Payer: Medicare Other | Admitting: Unknown Physician Specialty

## 2018-01-25 ENCOUNTER — Ambulatory Visit: Payer: Medicare Other

## 2018-01-25 ENCOUNTER — Encounter: Payer: Self-pay | Admitting: Unknown Physician Specialty

## 2018-01-25 VITALS — BP 125/78 | HR 108 | Temp 100.7°F | Ht 62.5 in | Wt 152.6 lb

## 2018-01-25 DIAGNOSIS — J45909 Unspecified asthma, uncomplicated: Secondary | ICD-10-CM | POA: Diagnosis not present

## 2018-01-25 DIAGNOSIS — R05 Cough: Secondary | ICD-10-CM | POA: Diagnosis not present

## 2018-01-25 DIAGNOSIS — J101 Influenza due to other identified influenza virus with other respiratory manifestations: Secondary | ICD-10-CM | POA: Diagnosis not present

## 2018-01-25 DIAGNOSIS — I10 Essential (primary) hypertension: Secondary | ICD-10-CM

## 2018-01-25 DIAGNOSIS — R059 Cough, unspecified: Secondary | ICD-10-CM

## 2018-01-25 LAB — VERITOR FLU A/B WAIVED
Influenza A: POSITIVE — AB
Influenza B: NEGATIVE

## 2018-01-25 MED ORDER — HYDROCOD POLST-CPM POLST ER 10-8 MG/5ML PO SUER: 5.0000 mL | Freq: Two times a day (BID) | ORAL | 0 refills | Status: DC | PRN

## 2018-01-25 MED ORDER — AZITHROMYCIN 250 MG PO TABS: ORAL_TABLET | ORAL | 0 refills | Status: DC

## 2018-01-25 MED ORDER — OSELTAMIVIR PHOSPHATE 75 MG PO CAPS: 75.0000 mg | ORAL_CAPSULE | Freq: Two times a day (BID) | ORAL | 0 refills | Status: DC

## 2018-01-25 NOTE — Progress Notes (Signed)
BP 125/78   Pulse (!) 108   Temp (!) 100.7 F (38.2 C) (Oral)   Ht 5' 2.5" (1.588 m)   Wt 152 lb 9.6 oz (69.2 kg)   LMP 07/31/1981 (Approximate)   SpO2 98%   BMI 27.47 kg/m    Subjective:    Patient ID: Nancy Mack, female    DOB: 1946-08-15, 72 y.o.   MRN: [id number]  HPI: Nancy Mack is a 72 y.o. female  Chief Complaint  Patient presents with  . URI    pt states she has had congestion and cough since Wednesday    URI   This is a new problem. Episode onset: 2 days. The problem has been gradually worsening. There has been no fever. Associated symptoms include congestion, coughing, headaches, rhinorrhea and wheezing. Pertinent negatives include no abdominal pain, chest pain, diarrhea, dysuria, ear pain, joint pain, joint swelling, nausea, neck pain, plugged ear sensation, rash, sinus pain, sneezing, sore throat, swollen glands or vomiting. Associated symptoms comments: Body aches. She has tried nothing for the symptoms. The treatment provided no relief.   Hypertension Using medications without difficulty Average home BPs Not checking   No problems or lightheadedness No chest pain with exertion or shortness of breath No Edema   Hyperlipidemia Using medications without problems: No Muscle aches  Diet compliance/Exercise: Not lately since surgery on foot  ASTHMA Wheezing now with present illness Symptoms are well controlled before her illness Using medications without problems: Night time symptoms:None Wheeze/SOB: Yes at this moment ER visits since last visit:none  Relevant past medical, surgical, family and social history reviewed and updated as indicated. Interim medical history since our last visit reviewed. Allergies and medications reviewed and updated.  Review of Systems  HENT: Positive for congestion and rhinorrhea. Negative for ear pain, sinus pain, sneezing and sore throat.   Respiratory: Positive for cough and wheezing.   Cardiovascular:  Negative for chest pain.  Gastrointestinal: Negative for abdominal pain, diarrhea, nausea and vomiting.  Genitourinary: Negative for dysuria.  Musculoskeletal: Negative for joint pain and neck pain.  Skin: Negative for rash.  Neurological: Positive for headaches.   Social History   Socioeconomic History  . Marital status: Married    Spouse name: Not on file  . Number of children: Not on file  . Years of education: Not on file  . Highest education level: Not on file  Social Needs  . Financial resource strain: Not on file  . Food insecurity - worry: Not on file  . Food insecurity - inability: Not on file  . Transportation needs - medical: Not on file  . Transportation needs - non-medical: Not on file  Occupational History  . Not on file  Tobacco Use  . Smoking status: Former Smoker    Packs/day: 2.00    Years: 2.00    Pack years: 4.00    Types: Cigarettes  . Smokeless tobacco: Never Used  Substance and Sexual Activity  . Alcohol use: Yes    Alcohol/week: 0.0 oz    Comment: on occasion  . Drug use: No  . Sexual activity: Yes  Other Topics Concern  . Not on file  Social History Narrative  . Not on file   Family History  Problem Relation Age of Onset  . Arthritis Mother   . Heart failure Mother   . Hypertension Mother   . Hyperlipidemia Mother   . Heart disease Mother   . Dementia Mother   . Asthma Father   . Aneurysm  Sister   . Alzheimer's disease Maternal Grandmother    Past Medical History:  Diagnosis Date  . Allergy   . Asthma   . Carpal tunnel syndrome   . Eczema   . Globus pharyngeus   . Hyperlipidemia   . Hypertension   . Laryngopharyngeal reflux   . Menopause   . Osteoporosis   . Raynaud's disease   . Rhinitis due to pollen   . SOB (shortness of breath)    Past Surgical History:  Procedure Laterality Date  . PARTIAL HYSTERECTOMY       Per HPI unless specifically indicated above     Objective:    BP 125/78   Pulse (!) 108   Temp (!)  100.7 F (38.2 C) (Oral)   Ht 5' 2.5" (1.588 m)   Wt 152 lb 9.6 oz (69.2 kg)   LMP 07/31/1981 (Approximate)   SpO2 98%   BMI 27.47 kg/m   Wt Readings from Last 3 Encounters:  01/25/18 152 lb 9.6 oz (69.2 kg)  07/25/17 154 lb (69.9 kg)  04/23/17 150 lb 3.2 oz (68.1 kg)    Physical Exam  Constitutional: She is oriented to person, place, and time. She appears well-developed and well-nourished. No distress.  HENT:  Head: Normocephalic and atraumatic.  Right Ear: Tympanic membrane and ear canal normal.  Left Ear: Tympanic membrane and ear canal normal.  Nose: Rhinorrhea present. Right sinus exhibits no maxillary sinus tenderness and no frontal sinus tenderness. Left sinus exhibits no maxillary sinus tenderness and no frontal sinus tenderness.  Mouth/Throat: Mucous membranes are normal. Posterior oropharyngeal erythema present.  Eyes: Conjunctivae and lids are normal. Right eye exhibits no discharge. Left eye exhibits no discharge. No scleral icterus.  Cardiovascular: Normal rate and regular rhythm.  Pulmonary/Chest: Effort normal and breath sounds normal. No respiratory distress.  Abdominal: Normal appearance. There is no splenomegaly or hepatomegaly.  Musculoskeletal: Normal range of motion.  Neurological: She is alert and oriented to person, place, and time.  Skin: Skin is intact. No rash noted. No pallor.  Psychiatric: She has a normal mood and affect. Her behavior is normal. Judgment and thought content normal.    Results for orders placed or performed in visit on 01/23/17  Comprehensive metabolic panel  Result Value Ref Range   Glucose 82 65 - 99 mg/dL   BUN 11 8 - 27 mg/dL   Creatinine, Ser 0.99 0.57 - 1.00 mg/dL   GFR calc non Af Amer 58 (L) >59 mL/min/1.73   GFR calc Af Amer 67 >59 mL/min/1.73   BUN/Creatinine Ratio 11 (L) 12 - 28   Sodium 139 134 - 144 mmol/L   Potassium 4.2 3.5 - 5.2 mmol/L   Chloride 97 96 - 106 mmol/L   CO2 27 18 - 29 mmol/L   Calcium 9.6 8.7 - 10.3  mg/dL   Total Protein 6.7 6.0 - 8.5 g/dL   Albumin 4.0 3.5 - 4.8 g/dL   Globulin, Total 2.7 1.5 - 4.5 g/dL   Albumin/Globulin Ratio 1.5 1.2 - 2.2   Bilirubin Total 0.2 0.0 - 1.2 mg/dL   Alkaline Phosphatase 57 39 - 117 IU/L   AST 21 0 - 40 IU/L   ALT 12 0 - 32 IU/L  Lipid Panel w/o Chol/HDL Ratio  Result Value Ref Range   Cholesterol, Total 195 100 - 199 mg/dL   Triglycerides 106 0 - 149 mg/dL   HDL 76 >39 mg/dL   VLDL Cholesterol Cal 21 5 - 40 mg/dL  LDL Calculated 98 0 - 99 mg/dL      Assessment & Plan:   Problem List Items Addressed This Visit      Unprioritized   Asthma    Acute flare due to influenza.  Does not tolerate Prednisone.  Emphasized regular inhaler use and to take Albuterol up to 6 times/day      Benign essential HTN    Stable, continue present medications.         Other Visit Diagnoses    Cough    -  Primary   Rx for Tussionex   Relevant Orders   Veritor Flu A/B Waived   Influenza A       High risk.  Tamiflu given.  Z pack in case of worsening symptoms   Relevant Medications   oseltamivir (TAMIFLU) 75 MG capsule   azithromycin (ZITHROMAX Z-PAK) 250 MG tablet       Follow up plan: Return for reschedule PE and wellness.

## 2018-01-25 NOTE — Assessment & Plan Note (Addendum)
Acute flare due to influenza.  Does not tolerate Prednisone.  Emphasized regular inhaler use and to take Albuterol up to 6 times/day

## 2018-01-25 NOTE — Assessment & Plan Note (Signed)
Stable, continue present medications.   

## 2018-01-30 ENCOUNTER — Other Ambulatory Visit: Payer: Self-pay | Admitting: Unknown Physician Specialty

## 2018-01-30 NOTE — Telephone Encounter (Signed)
montelukst refill Last OV:01/25/18 for the flu  Last Refill:09/28/17 Pharmacy: [facility], [place]  fexofenadine refill Last OV: 01/25/18 for the flu Last Refill:09/10/17 Pharmacy: [place], [place]     Pt has not scheduled her CPE which was due around 01/25/18; pt  has no upcoming appointments; spoke with Santiago Glad regarding this request and she will notify Kathrine Haddock.

## 2018-02-11 DIAGNOSIS — J301 Allergic rhinitis due to pollen: Secondary | ICD-10-CM | POA: Diagnosis not present

## 2018-02-13 ENCOUNTER — Ambulatory Visit (INDEPENDENT_AMBULATORY_CARE_PROVIDER_SITE_OTHER): Payer: Self-pay | Admitting: Podiatry

## 2018-02-13 ENCOUNTER — Encounter: Payer: Self-pay | Admitting: Podiatry

## 2018-02-13 DIAGNOSIS — Z9889 Other specified postprocedural states: Secondary | ICD-10-CM

## 2018-02-13 DIAGNOSIS — D3610 Benign neoplasm of peripheral nerves and autonomic nervous system, unspecified: Secondary | ICD-10-CM

## 2018-02-13 NOTE — Progress Notes (Signed)
She presents today for follow-up of her surgical foot date of surgery December 07, 2017 neurectomy third interdigital space right.  States that is doing good sometimes have a little pain in it but not too bad.  Objective: Vital signs are stable she is alert and oriented x3.  Pulses are palpable.  There is no erythema edema cellulitis drainage or odor the incision site is healing very nicely there is some scar present but this should go on to heal uneventfully.  Assessment: Well-healing surgical foot status post neurectomy third interdigital space.  Plan:

## 2018-02-18 ENCOUNTER — Ambulatory Visit (INDEPENDENT_AMBULATORY_CARE_PROVIDER_SITE_OTHER): Payer: Medicare Other

## 2018-02-18 VITALS — BP 138/76 | HR 68 | Temp 97.8°F | Resp 16 | Ht 63.0 in | Wt 149.2 lb

## 2018-02-18 DIAGNOSIS — Z0000 Encounter for general adult medical examination without abnormal findings: Secondary | ICD-10-CM

## 2018-02-18 DIAGNOSIS — J301 Allergic rhinitis due to pollen: Secondary | ICD-10-CM | POA: Diagnosis not present

## 2018-02-18 NOTE — Progress Notes (Signed)
Subjective:   Nancy Mack is a 72 y.o. female who presents for Medicare Annual (Subsequent) preventive examination.  Review of Systems:   Cardiac Risk Factors include: hypertension;advanced age (>31men, >20 women);dyslipidemia     Objective:     Vitals: BP 138/76 (BP Location: Left Arm, Patient Position: Sitting)   Pulse 68   Temp 97.8 F (36.6 C) (Temporal)   Resp 16   Ht 5\' 3"  (1.6 m)   Wt 149 lb 3.2 oz (67.7 kg)   LMP 07/31/1981 (Approximate)   BMI 26.43 kg/m   Body mass index is 26.43 kg/m.  Advanced Directives 02/18/2018 01/23/2017 11/05/2015  Does Patient Have a Medical Advance Directive? No No No  Does patient want to make changes to medical advance directive? Yes (MAU/Ambulatory/Procedural Areas - Information given) - -    Tobacco Social History   Tobacco Use  Smoking Status Former Smoker  . Packs/day: 2.00  . Years: 2.00  . Pack years: 4.00  . Types: Cigarettes  . Last attempt to quit: 12/18/1978  . Years since quitting: 39.1  Smokeless Tobacco Never Used  Tobacco Comment   quit in 1980     Counseling given: Not Answered Comment: quit in 1980   Clinical Intake:  Pre-visit preparation completed: Yes  Pain : 0-10 Pain Score: 10-Worst pain ever Pain Type: Acute pain Pain Location: Shoulder Pain Orientation: Right Pain Descriptors / Indicators: Aching Pain Onset: 1 to 4 weeks ago Pain Frequency: Constant     Nutritional Status: BMI 25 -29 Overweight Nutritional Risks: None Diabetes: No  How often do you need to have someone help you when you read instructions, pamphlets, or other written materials from your doctor or pharmacy?: 1 - Never What is the last grade level you completed in school?: associates degree  Interpreter Needed?: No  Information entered by :: Millenia Waldvogel,LPN   Past Medical History:  Diagnosis Date  . Allergy   . Asthma   . Carpal tunnel syndrome   . Eczema   . Globus pharyngeus   . Hyperlipidemia   .  Hypertension   . Laryngopharyngeal reflux   . Menopause   . Neuroma    right foot  . Osteoporosis   . Raynaud's disease   . Rhinitis due to pollen   . SOB (shortness of breath)    Past Surgical History:  Procedure Laterality Date  . PARTIAL HYSTERECTOMY     Family History  Problem Relation Age of Onset  . Arthritis Mother   . Heart failure Mother   . Hypertension Mother   . Hyperlipidemia Mother   . Heart disease Mother   . Dementia Mother   . Asthma Father   . Aneurysm Sister   . Alzheimer's disease Maternal Grandmother    Social History   Socioeconomic History  . Marital status: Married    Spouse name: None  . Number of children: None  . Years of education: None  . Highest education level: None  Social Needs  . Financial resource strain: Not hard at all  . Food insecurity - worry: Never true  . Food insecurity - inability: Never true  . Transportation needs - medical: No  . Transportation needs - non-medical: No  Occupational History  . None  Tobacco Use  . Smoking status: Former Smoker    Packs/day: 2.00    Years: 2.00    Pack years: 4.00    Types: Cigarettes    Last attempt to quit: 12/18/1978    Years since  quitting: 39.1  . Smokeless tobacco: Never Used  . Tobacco comment: quit in 1980  Substance and Sexual Activity  . Alcohol use: Yes    Alcohol/week: 0.0 oz    Comment: on occasion  . Drug use: No  . Sexual activity: Yes  Other Topics Concern  . None  Social History Narrative  . None    Outpatient Encounter Medications as of 02/18/2018  Medication Sig  . ADVAIR DISKUS 500-50 MCG/DOSE AEPB Inhale 1 puff into the lungs 2 (two) times daily.  Marland Kitchen albuterol (PROAIR HFA) 108 (90 Base) MCG/ACT inhaler Inhale 2 puffs into the lungs every 6 (six) hours as needed.  Marland Kitchen aspirin 81 MG tablet Take 81 mg by mouth daily.  Marland Kitchen atorvastatin (LIPITOR) 20 MG tablet Take 1 tablet (20 mg total) by mouth daily.  Marland Kitchen azelastine (ASTELIN) 0.1 % nasal spray Place 1 spray 2  (two) times daily into both nostrils. Use in each nostril as directed  . Cholecalciferol (VITAMIN D) 2000 UNITS tablet Take 2,000 Units by mouth daily.  Marland Kitchen estradiol (ESTRACE) 2 MG tablet Take 1 tablet (2 mg total) by mouth daily.  . fexofenadine (ALLEGRA) 180 MG tablet Take 1 tablet (180 mg total) by mouth daily.  . fluticasone (FLONASE) 50 MCG/ACT nasal spray Place 2 sprays into both nostrils daily.  . hydrochlorothiazide (HYDRODIURIL) 25 MG tablet Take 0.5 tablets (12.5 mg total) by mouth daily.  . montelukast (SINGULAIR) 10 MG tablet Take 1 tablet (10 mg total) by mouth at bedtime.  Marland Kitchen UNABLE TO FIND Allergy Injection, states Dr. Richardson Landry does this every weeks  . triamcinolone cream (KENALOG) 0.1 % Apply 1 application topically 2 (two) times daily. (Patient not taking: Reported on 02/18/2018)  . [DISCONTINUED] azithromycin (ZITHROMAX Z-PAK) 250 MG tablet As directed (Patient not taking: Reported on 02/18/2018)  . [DISCONTINUED] chlorpheniramine-HYDROcodone (TUSSIONEX PENNKINETIC ER) 10-8 MG/5ML SUER Take 5 mLs by mouth every 12 (twelve) hours as needed for cough. (Patient not taking: Reported on 02/18/2018)  . [DISCONTINUED] oseltamivir (TAMIFLU) 75 MG capsule Take 1 capsule (75 mg total) by mouth 2 (two) times daily. (Patient not taking: Reported on 02/18/2018)   No facility-administered encounter medications on file as of 02/18/2018.     Activities of Daily Living In your present state of health, do you have any difficulty performing the following activities: 02/18/2018 01/25/2018  Hearing? N N  Vision? N N  Difficulty concentrating or making decisions? N N  Walking or climbing stairs? Y N  Comment SOB  -  Dressing or bathing? N N  Doing errands, shopping? N N  Preparing Food and eating ? N -  Using the Toilet? N -  In the past six months, have you accidently leaked urine? N -  Do you have problems with loss of bowel control? N -  Managing your Medications? N -  Managing your Finances? N -    Housekeeping or managing your Housekeeping? N -  Some recent data might be hidden    Patient Care Team: Kathrine Haddock, NP as PCP - General (Nurse Practitioner) Kathrine Haddock, NP (Nurse Practitioner) [name], MD as Consulting Physician (Gastroenterology) Clyde Canterbury, MD as Referring Physician (Otolaryngology) [employer] [facility])    Assessment:   This is a routine wellness examination for Nancy Mack.  Exercise Activities and Dietary recommendations Current Exercise Habits: The patient does not participate in regular exercise at present, Exercise limited by: None identified  Goals    . DIET - INCREASE WATER INTAKE  Recommend drinking at least 6-8 glasses of water a day.        Fall Risk Fall Risk  02/18/2018 01/25/2018 01/23/2017 11/06/2016 11/05/2015  Falls in the past year? No No No No No   Is the patient's home free of loose throw rugs in walkways, pet beds, electrical cords, etc?   yes      Grab bars in the bathroom? No       Handrails on the stairs?   yes, no stairs in home       Adequate lighting?   yes  Timed Get Up and Go performed: Completed in 8 seconds with no use of assistive devices, steady gait. No intervention needed at this time.   Depression Screen PHQ 2/9 Scores 02/18/2018 01/25/2018 01/23/2017 11/06/2016  PHQ - 2 Score 0 0 0 0  PHQ- 9 Score 0 0 0 -     Cognitive Function     6CIT Screen 02/18/2018  What Year? 0 points  What month? 0 points  What time? 0 points  Count back from 20 0 points  Months in reverse 0 points  Repeat phrase 2 points  Total Score 2    Immunization History  Administered Date(s) Administered  . Influenza, High Dose Seasonal PF 09/21/2016, 10/02/2017  . Influenza,inj,Quad PF,6+ Mos 10/01/2015  . Influenza-Unspecified 09/10/2012, 09/15/2014, 09/21/2016  . Pneumococcal Conjugate-13 01/22/2015  . Pneumococcal Polysaccharide-23 06/05/2012  . Pneumococcal-Unspecified 10/19/2014  . Td 08/26/2009  . Zoster 03/13/2007      Qualifies for Shingles Vaccine? Yes, discussed shingrix vaccine.   Screening Tests Health Maintenance  Topic Date Due  . MAMMOGRAM  02/07/2019  . TETANUS/TDAP  08/27/2019  . COLONOSCOPY  03/03/2025  . INFLUENZA VACCINE  Completed  . DEXA SCAN  Completed  . Hepatitis C Screening  Completed  . PNA vac Low Risk Adult  Completed    Cancer Screenings: Lung: Low Dose CT Chest recommended if Age 64-80 years, 30 pack-year currently smoking OR have quit w/in 15years. Patient does not qualify. Breast:  Up to date on Mammogram? Yes   Up to date of Bone Density/Dexa? Yes  Colorectal: completed 03/04/2015  Additional Screenings:  Hepatitis B/HIV/Syphillis: not indicated  Hepatitis C Screening: completed 11/05/2015     Plan:    I have personally reviewed and addressed the Medicare Annual Wellness questionnaire and have noted the following in the patient's chart:  A. Medical and social history B. Use of alcohol, tobacco or illicit drugs  C. Current medications and supplements D. Functional ability and status E.  Nutritional status F.  Physical activity G. Advance directives H. List of other physicians I.  Hospitalizations, surgeries, and ER visits in previous 12 months J.  [place] such as hearing and vision if needed, cognitive and depression L. Referrals and appointments   In addition, I have reviewed and discussed with patient certain preventive protocols, quality metrics, and best practice recommendations. A written personalized care plan for preventive services as well as general preventive health recommendations were provided to patient.   Signed,  Tyler Aas, LPN Nurse Health Advisor   Nurse Notes: patient complaining of right shoulder pain , rated 10/10 currently. Ongoing for 2+ weeks, starts as a sharp pain and then gets dull throughout the day. Takes Tylenol- states it helps for an hour, using heating pad as needed. Patient has CPE tomorrow 02/19/2018.

## 2018-02-18 NOTE — Patient Instructions (Addendum)
Nancy Mack , Thank you for taking time to come for your Medicare Wellness Visit. I appreciate your ongoing commitment to your health goals. Please review the following plan we discussed and let me know if I can assist you in the future.   Screening recommendations/referrals: Colonoscopy: completed 03/04/2015 Mammogram: completed 02/07/2017. Please call [phone] to schedule your mammogram.  Bone Density: completed 05/18/2016 Recommended yearly ophthalmology/optometry visit for glaucoma screening and checkup Recommended yearly dental visit for hygiene and checkup  Vaccinations: Influenza vaccine: up to date Pneumococcal vaccine: up to date Tdap vaccine: up to date Shingles vaccine: zostavax completed, shingrix eligible- check with your insurance company for coverage   Advanced directives: Advance directive discussed with you today. I have provided a copy for you to complete at home and have notarized. Once this is complete please bring a copy in to our office so we can scan it into your chart.  Conditions/risks identified: Recommend drinking at least 6-8 glasses of water a day.   Next appointment: Follow up on 02/19/2018 at 2:00pm with Nancy Mack. Follow up in one year for your annual wellness exam.    Preventive Care 72 Years and Older, Female Preventive care refers to lifestyle choices and visits with your health care provider that can promote health and wellness. What does preventive care include?  A yearly physical exam. This is also called an annual well check.  Dental exams once or twice a year.  Routine eye exams. Ask your health care provider how often you should have your eyes checked.  Personal lifestyle choices, including:  Daily care of your teeth and gums.  Regular physical activity.  Eating a healthy diet.  Avoiding tobacco and drug use.  Limiting alcohol use.  Practicing safe sex.  Taking low-dose aspirin every day.  Taking vitamin and mineral  supplements as recommended by your health care provider. What happens during an annual well check? The services and screenings done by your health care provider during your annual well check will depend on your age, overall health, lifestyle risk factors, and family history of disease. Counseling  Your health care provider may ask you questions about your:  Alcohol use.  Tobacco use.  Drug use.  Emotional well-being.  Home and relationship well-being.  Sexual activity.  Eating habits.  History of falls.  Memory and ability to understand (cognition).  Work and work Statistician.  Reproductive health. Screening  You may have the following tests or measurements:  Height, weight, and BMI.  Blood pressure.  Lipid and cholesterol levels. These may be checked every 5 years, or more frequently if you are over 2 years old.  Skin check.  Lung cancer screening. You may have this screening every year starting at age 72 if you have a 30-pack-year history of smoking and currently smoke or have quit within the past 15 years.  Fecal occult blood test (FOBT) of the stool. You may have this test every year starting at age 72.  Flexible sigmoidoscopy or colonoscopy. You may have a sigmoidoscopy every 5 years or a colonoscopy every 10 years starting at age 72.  Hepatitis C blood test.  Hepatitis B blood test.  Sexually transmitted disease (STD) testing.  Diabetes screening. This is done by checking your blood sugar (glucose) after you have not eaten for a while (fasting). You may have this done every 1-3 years.  Bone density scan. This is done to screen for osteoporosis. You may have this done starting at age 72.  Mammogram. This may  be done every 1-2 years. Talk to your health care provider about how often you should have regular mammograms. Talk with your health care provider about your test results, treatment options, and if necessary, the need for more tests. Vaccines  Your  health care provider may recommend certain vaccines, such as:  Influenza vaccine. This is recommended every year.  Tetanus, diphtheria, and acellular pertussis (Tdap, Td) vaccine. You may need a Td booster every 10 years.  Zoster vaccine. You may need this after age 72.  Pneumococcal 13-valent conjugate (PCV13) vaccine. One dose is recommended after age 72.  Pneumococcal polysaccharide (PPSV23) vaccine. One dose is recommended after age 72. Talk to your health care provider about which screenings and vaccines you need and how often you need them. This information is not intended to replace advice given to you by your health care provider. Make sure you discuss any questions you have with your health care provider. Document Released: 12/31/2015 Document Revised: 08/23/2016 Document Reviewed: 10/05/2015 Elsevier Interactive Patient Education  2017 [place] Prevention in the Home Falls can cause injuries. They can happen to people of all ages. There are many things you can do to make your home safe and to help prevent falls. What can I do on the outside of my home?  Regularly fix the edges of walkways and driveways and fix any cracks.  Remove anything that might make you trip as you walk through a door, such as a raised step or threshold.  Trim any bushes or trees on the path to your home.  Use bright outdoor lighting.  Clear any walking paths of anything that might make someone trip, such as rocks or tools.  Regularly check to see if handrails are loose or broken. Make sure that both sides of any steps have handrails.  Any raised decks and porches should have guardrails on the edges.  Have any leaves, snow, or ice cleared regularly.  Use sand or salt on walking paths during winter.  Clean up any spills in your garage right away. This includes oil or grease spills. What can I do in the bathroom?  Use night lights.  Install grab bars by the toilet and in the tub and  shower. Do not use towel bars as grab bars.  Use non-skid mats or decals in the tub or shower.  If you need to sit down in the shower, use a plastic, non-slip stool.  Keep the floor dry. Clean up any water that spills on the floor as soon as it happens.  Remove soap buildup in the tub or shower regularly.  Attach bath mats securely with double-sided non-slip rug tape.  Do not have throw rugs and other things on the floor that can make you trip. What can I do in the bedroom?  Use night lights.  Make sure that you have a light by your bed that is easy to reach.  Do not use any sheets or blankets that are too big for your bed. They should not hang down onto the floor.  Have a firm chair that has side arms. You can use this for support while you get dressed.  Do not have throw rugs and other things on the floor that can make you trip. What can I do in the kitchen?  Clean up any spills right away.  Avoid walking on wet floors.  Keep items that you use a lot in easy-to-reach places.  If you need to reach something above you,  use a strong step stool that has a grab bar.  Keep electrical cords out of the way.  Do not use floor polish or wax that makes floors slippery. If you must use wax, use non-skid floor wax.  Do not have throw rugs and other things on the floor that can make you trip. What can I do with my stairs?  Do not leave any items on the stairs.  Make sure that there are handrails on both sides of the stairs and use them. Fix handrails that are broken or loose. Make sure that handrails are as long as the stairways.  Check any carpeting to make sure that it is firmly attached to the stairs. Fix any carpet that is loose or worn.  Avoid having throw rugs at the top or bottom of the stairs. If you do have throw rugs, attach them to the floor with carpet tape.  Make sure that you have a light switch at the top of the stairs and the bottom of the stairs. If you do not  have them, ask someone to add them for you. What else can I do to help prevent falls?  Wear shoes that:  Do not have high heels.  Have rubber bottoms.  Are comfortable and fit you well.  Are closed at the toe. Do not wear sandals.  If you use a stepladder:  Make sure that it is fully opened. Do not climb a closed stepladder.  Make sure that both sides of the stepladder are locked into place.  Ask someone to hold it for you, if possible.  Clearly mark and make sure that you can see:  Any grab bars or handrails.  First and last steps.  Where the edge of each step is.  Use tools that help you move around (mobility aids) if they are needed. These include:  Canes.  Walkers.  Scooters.  Crutches.  Turn on the lights when you go into a dark area. Replace any light bulbs as soon as they burn out.  Set up your furniture so you have a clear path. Avoid moving your furniture around.  If any of your floors are uneven, fix them.  If there are any pets around you, be aware of where they are.  Review your medicines with your doctor. Some medicines can make you feel dizzy. This can increase your chance of falling. Ask your doctor what other things that you can do to help prevent falls. This information is not intended to replace advice given to you by your health care provider. Make sure you discuss any questions you have with your health care provider. Document Released: 09/30/2009 Document Revised: 05/11/2016 Document Reviewed: 01/08/2015 Elsevier Interactive Patient Education  2017 [employer].

## 2018-02-19 ENCOUNTER — Encounter: Payer: Medicare Other | Admitting: Unknown Physician Specialty

## 2018-02-19 ENCOUNTER — Ambulatory Visit (INDEPENDENT_AMBULATORY_CARE_PROVIDER_SITE_OTHER): Payer: Medicare Other | Admitting: Family Medicine

## 2018-02-19 ENCOUNTER — Encounter: Payer: Self-pay | Admitting: Family Medicine

## 2018-02-19 VITALS — BP 138/80 | HR 84 | Temp 98.5°F | Wt 152.2 lb

## 2018-02-19 DIAGNOSIS — M25511 Pain in right shoulder: Secondary | ICD-10-CM

## 2018-02-19 DIAGNOSIS — M19011 Primary osteoarthritis, right shoulder: Secondary | ICD-10-CM | POA: Diagnosis not present

## 2018-02-19 MED ORDER — NAPROXEN 500 MG PO TABS
500.0000 mg | ORAL_TABLET | Freq: Two times a day (BID) | ORAL | 3 refills | Status: DC
Start: 2018-02-19 — End: 2018-10-16

## 2018-02-19 NOTE — Progress Notes (Signed)
BP 138/80 (BP Location: Left Arm, Patient Position: Sitting, Cuff Size: Normal)   Pulse 84   Temp 98.5 F (36.9 C)   Wt 152 lb 4 oz (69.1 kg)   LMP 07/31/1981 (Approximate)   SpO2 98%   BMI 26.97 kg/m    Subjective:    Patient ID: Nancy Mack, female    DOB: [birth year], 72 y.o.   MRN: [id number]  HPI: Nancy Mack is a 72 y.o. female  Chief Complaint  Patient presents with  . Shoulder Pain    right, no know injury   SHOULDER PAIN Duration: a couple of weeks Involved shoulder: right Mechanism of injury: unknown Location: diffuse Onset:gradual Severity: severe  Quality:  Aching, sharp deep inside Frequency: constant Radiation: yes into her arm Aggravating factors: lifting and movement  Alleviating factors: heat and APAP  Status: worse Treatments attempted: rest, heat, APAP, ibuprofen and aleve  Relief with NSAIDs?:  no Weakness: yes Numbness: no Decreased grip strength: no Redness: no Swelling: no Bruising: no Fevers: no  Relevant past medical, surgical, family and social history reviewed and updated as indicated. Interim medical history since our last visit reviewed. Allergies and medications reviewed and updated.  Review of Systems  Constitutional: Negative.   Respiratory: Negative.   Cardiovascular: Negative.   Musculoskeletal: Positive for arthralgias and myalgias. Negative for back pain, gait problem, joint swelling, neck pain and neck stiffness.  Skin: Negative.   Psychiatric/Behavioral: Negative.     Per HPI unless specifically indicated above     Objective:    BP 138/80 (BP Location: Left Arm, Patient Position: Sitting, Cuff Size: Normal)   Pulse 84   Temp 98.5 F (36.9 C)   Wt 152 lb 4 oz (69.1 kg)   LMP 07/31/1981 (Approximate)   SpO2 98%   BMI 26.97 kg/m   Wt Readings from Last 3 Encounters:  02/19/18 152 lb 4 oz (69.1 kg)  02/18/18 149 lb 3.2 oz (67.7 kg)  01/25/18 152 lb 9.6 oz (69.2 kg)    Physical Exam    Constitutional: She is oriented to person, place, and time. She appears well-developed and well-nourished. No distress.  HENT:  Head: Normocephalic and atraumatic.  Right Ear: Hearing normal.  Left Ear: Hearing normal.  Nose: Nose normal.  Eyes: Conjunctivae and lids are normal. Right eye exhibits no discharge. Left eye exhibits no discharge. No scleral icterus.  Cardiovascular: Normal rate, regular rhythm, normal heart sounds and intact distal pulses. Exam reveals no gallop and no friction rub.  No murmur heard. Pulmonary/Chest: Effort normal and breath sounds normal. No respiratory distress. She has no wheezes. She has no rales. She exhibits no tenderness.  Neurological: She is alert and oriented to person, place, and time.  Skin: Skin is warm, dry and intact. No rash noted. She is not diaphoretic. No erythema. No pallor.  Psychiatric: She has a normal mood and affect. Her speech is normal and behavior is normal. Judgment and thought content normal. Cognition and memory are normal.  Nursing note and vitals reviewed.    Shoulder: right    Inspection:  no swelling, ecchymosis, erythema or step off deformity.     Tenderness to Palpation:    Acromion: yes    AC joint:yes    Clavicle: yes    Bicipital groove: yes    Scapular spine: no    Coracoid process: no    Humeral head: yes    Supraspinatus tendon: no     Range of Motion:  Full Range of  Motion bilaterally    Painful arc: yes     Muscle Strength: 5/5 bilaterally     Neuro: Sensation WNL. and Upper extremity reflexes WNL.     Special Tests:     Neer sign: Negative    Hawkins sign: Positive    Cross arm adduction: Negative    Yergason sign: Negative    O'brien sign: Negative     Speed sign: Negative   Results for orders placed or performed in visit on 01/25/18  Veritor Flu A/B Waived  Result Value Ref Range   Influenza A Positive (A) Negative   Influenza B Negative Negative      Assessment & Plan:   Problem List  Items Addressed This Visit    None    Visit Diagnoses    Acute pain of right shoulder    -  Primary   Will treat with naproxen and obtain x-ray. Await results. Call with any concerns.    Relevant Orders   DG Shoulder Right       Follow up plan: Return if symptoms worsen or fail to improve.

## 2018-02-20 ENCOUNTER — Telehealth: Payer: Self-pay | Admitting: Family Medicine

## 2018-02-20 DIAGNOSIS — M19011 Primary osteoarthritis, right shoulder: Secondary | ICD-10-CM

## 2018-02-20 NOTE — Telephone Encounter (Signed)
Message relayed to patient. Verbalized understanding and denied questions.   

## 2018-02-20 NOTE — Telephone Encounter (Signed)
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 Referral generated.

## 2018-02-25 DIAGNOSIS — J301 Allergic rhinitis due to pollen: Secondary | ICD-10-CM | POA: Diagnosis not present

## 2018-02-26 DIAGNOSIS — M7540 Impingement syndrome of unspecified shoulder: Secondary | ICD-10-CM | POA: Insufficient documentation

## 2018-02-26 DIAGNOSIS — M7541 Impingement syndrome of right shoulder: Secondary | ICD-10-CM | POA: Diagnosis not present

## 2018-03-04 DIAGNOSIS — J301 Allergic rhinitis due to pollen: Secondary | ICD-10-CM | POA: Diagnosis not present

## 2018-03-06 ENCOUNTER — Ambulatory Visit (INDEPENDENT_AMBULATORY_CARE_PROVIDER_SITE_OTHER): Payer: Medicare Other | Admitting: Unknown Physician Specialty

## 2018-03-06 ENCOUNTER — Encounter: Payer: Self-pay | Admitting: Unknown Physician Specialty

## 2018-03-06 DIAGNOSIS — I10 Essential (primary) hypertension: Secondary | ICD-10-CM

## 2018-03-06 DIAGNOSIS — E782 Mixed hyperlipidemia: Secondary | ICD-10-CM | POA: Diagnosis not present

## 2018-03-06 DIAGNOSIS — M19011 Primary osteoarthritis, right shoulder: Secondary | ICD-10-CM | POA: Insufficient documentation

## 2018-03-06 DIAGNOSIS — Z780 Asymptomatic menopausal state: Secondary | ICD-10-CM | POA: Diagnosis not present

## 2018-03-06 DIAGNOSIS — Z7189 Other specified counseling: Secondary | ICD-10-CM | POA: Insufficient documentation

## 2018-03-06 NOTE — Assessment & Plan Note (Signed)
Stable, continue present medications.   

## 2018-03-06 NOTE — Assessment & Plan Note (Signed)
Shared decision making about continuing HRT.  Discussed process of weaning off if tolerated.  For now, she would like to continue.  Risk/benefits discussed

## 2018-03-06 NOTE — Progress Notes (Signed)
BP 127/85   Pulse 99   Temp 98.5 F (36.9 C) (Oral)   Ht 5\' 3"  (1.6 m)   Wt 150 lb 6.4 oz (68.2 kg)   LMP 07/31/1981 (Approximate)   SpO2 98%   BMI 26.64 kg/m    Subjective:    Patient ID: Nancy Mack, female    DOB: [birth year], 72 y.o.   MRN: [id number]  HPI: Nancy Mack is a 72 y.o. female  Chief Complaint  Patient presents with  . Annual Exam    pt had wellness exam with [facility] 02/18/18   Reviewed [facility] note.    Right shoulder pain Pt with right shoulder pain and following with [facility].  Cortisone shot no help and taking naprosyn.  Given Tramadol and Cyclobenzeprine here by Dr. [name]  ASTHMA Symptoms are well controlled Using medications without problems Night time symptoms:none Wheeze/SOB:Used emergency inhaler a couple of times  Hypertension Using medications without difficulty Average home BPs   No problems or lightheadedness No chest pain with exertion or shortness of breath No Edema   Hyperlipidemia Using medications without problems: No Muscle aches  Diet compliance:Exercise: walking  Depression screen [facility] 2/9 02/18/2018 01/25/2018 01/23/2017 11/06/2016 11/05/2015  Decreased Interest 0 0 0 0 0  Down, Depressed, Hopeless 0 0 0 0 0  PHQ - 2 Score 0 0 0 0 0  Altered sleeping 0 0 0 - -  Tired, decreased energy 0 0 0 - -  Change in appetite 0 0 0 - -  Feeling bad or failure about yourself  0 0 0 - -  Trouble concentrating 0 0 0 - -  Moving slowly or fidgety/restless 0 0 0 - -  Suicidal thoughts 0 0 0 - -  PHQ-9 Score 0 0 0 - -     Family History  Problem Relation Age of Onset  . Arthritis Mother   . Heart failure Mother   . Hypertension Mother   . Hyperlipidemia Mother   . Heart disease Mother   . Dementia Mother   . Asthma Father   . Aneurysm Sister   . Alzheimer's disease Maternal Grandmother    Social History   Socioeconomic History  . Marital status: Married    Spouse name: None  . Number of children: None  . Years  of education: None  . Highest education level: None  Social Needs  . Financial resource strain: Not hard at all  . Food insecurity - worry: Never true  . Food insecurity - inability: Never true  . Transportation needs - medical: No  . Transportation needs - non-medical: No  Occupational History  . None  Tobacco Use  . Smoking status: Former Smoker    Packs/day: 2.00    Years: 2.00    Pack years: 4.00    Types: Cigarettes    Last attempt to quit: 12/18/1978    Years since quitting: 39.2  . Smokeless tobacco: Never Used  . Tobacco comment: quit in 1980  Substance and Sexual Activity  . Alcohol use: Yes    Alcohol/week: 0.0 oz    Comment: on occasion  . Drug use: No  . Sexual activity: Yes  Other Topics Concern  . None  Social History Narrative  . None   Past Medical History:  Diagnosis Date  . Allergy   . Asthma   . Carpal tunnel syndrome   . Eczema   . Globus pharyngeus   . Hyperlipidemia   . Hypertension   . Laryngopharyngeal reflux   .  Menopause   . Neuroma    right foot  . Osteoporosis   . Raynaud's disease   . Rhinitis due to pollen   . SOB (shortness of breath)    Past Surgical History:  Procedure Laterality Date  . PARTIAL HYSTERECTOMY       Relevant past medical, surgical, family and social history reviewed and updated as indicated. Interim medical history since our last visit reviewed. Allergies and medications reviewed and updated.  Review of Systems  Constitutional: Negative.   HENT: Negative.   Eyes: Negative.   Gastrointestinal: Negative.   Genitourinary: Negative.   Musculoskeletal: Negative.   Neurological: Negative.   Hematological: Negative.   Psychiatric/Behavioral: Negative.     Per HPI unless specifically indicated above     Objective:    BP 127/85   Pulse 99   Temp 98.5 F (36.9 C) (Oral)   Ht 5\' 3"  (1.6 m)   Wt 150 lb 6.4 oz (68.2 kg)   LMP 07/31/1981 (Approximate)   SpO2 98%   BMI 26.64 kg/m   Wt Readings from  Last 3 Encounters:  03/06/18 150 lb 6.4 oz (68.2 kg)  02/19/18 152 lb 4 oz (69.1 kg)  02/18/18 149 lb 3.2 oz (67.7 kg)    Physical Exam  Constitutional: She is oriented to person, place, and time. She appears well-developed and well-nourished.  HENT:  Head: Normocephalic and atraumatic.  Eyes: Pupils are equal, round, and reactive to light. Right eye exhibits no discharge. Left eye exhibits no discharge. No scleral icterus.  Neck: Normal range of motion. Neck supple. Carotid bruit is not present. No thyromegaly present.  Cardiovascular: Normal rate, regular rhythm and normal heart sounds. Exam reveals no gallop and no friction rub.  No murmur heard. Pulmonary/Chest: Effort normal and breath sounds normal. No respiratory distress. She has no wheezes. She has no rales.  Abdominal: Soft. Bowel sounds are normal. There is no tenderness. There is no rebound.  Genitourinary: No breast swelling, tenderness or discharge.  Musculoskeletal: Normal range of motion.  Lymphadenopathy:    She has no cervical adenopathy.  Neurological: She is alert and oriented to person, place, and time.  Skin: Skin is warm, dry and intact. No rash noted.  Psychiatric: She has a normal mood and affect. Her speech is normal and behavior is normal. Judgment and thought content normal. Cognition and memory are normal.    Results for orders placed or performed in visit on 01/25/18  Veritor Flu A/B Waived  Result Value Ref Range   Influenza A Positive (A) Negative   Influenza B Negative Negative      Assessment & Plan:   Problem List Items Addressed This Visit    None       Follow up plan: No Follow-up on file.

## 2018-03-06 NOTE — Assessment & Plan Note (Signed)
A voluntary discussion about advance care planning including the explanation and discussion of advance directives was extensively discussed  with the patient.  Explanation about the health care proxy and Living will was reviewed and packet with forms with explanation of how to fill them out was given.  During this discussion, the patient was able to identify a health care proxy as her husband and plans to fill out the paperwork required.  Patient was offered a separate [place] visit for further assistance with forms.  Time spent:  20 minutes      Individuals present: patient

## 2018-03-06 NOTE — Progress Notes (Signed)
BP 127/85   Pulse 99   Temp 98.5 F (36.9 C) (Oral)   Ht 5\' 3"  (1.6 m)   Wt 150 lb 6.4 oz (68.2 kg)   LMP 07/31/1981 (Approximate)   SpO2 98%   BMI 26.64 kg/m    Subjective:    Patient ID: Terisa Starr, female    DOB: November 19, 1946, 72 y.o.   MRN: [id number]  HPI: Coreena Rubalcava is a 72 y.o. female  Chief Complaint  Patient presents with  . Annual Exam    pt had wellness exam with [facility] 02/18/18   Reviewed [facility] note.    Right shoulder pain Pt with right shoulder pain and following with [facility].  Cortisone shot no help and taking naprosyn.  Given Tramadol and Cyclobenzeprine here by Dr. [name]  ASTHMA Symptoms are well controlled Using medications without problems Night time symptoms:none Wheeze/SOB:Used emergency inhaler a couple of times  Hypertension Using medications without difficulty Average home BPs   No problems or lightheadedness No chest pain with exertion or shortness of breath No Edema   Hyperlipidemia Using medications without problems: No Muscle aches  Diet compliance:Exercise: walking Relevant past medical, surgical, family and social history reviewed and updated as indicated. Interim medical history since our last visit reviewed. Allergies and medications reviewed and updated.  Family History  Problem Relation Age of Onset  . Arthritis Mother   . Heart failure Mother   . Hypertension Mother   . Hyperlipidemia Mother   . Heart disease Mother   . Dementia Mother   . Asthma Father   . Aneurysm Sister   . Alzheimer's disease Maternal Grandmother    Social History   Socioeconomic History  . Marital status: Married    Spouse name: None  . Number of children: None  . Years of education: None  . Highest education level: None  Social Needs  . Financial resource strain: Not hard at all  . Food insecurity - worry: Never true  . Food insecurity - inability: Never true  . Transportation needs - medical: No  . Transportation  needs - non-medical: No  Occupational History  . None  Tobacco Use  . Smoking status: Former Smoker    Packs/day: 2.00    Years: 2.00    Pack years: 4.00    Types: Cigarettes    Last attempt to quit: 12/18/1978    Years since quitting: 39.2  . Smokeless tobacco: Never Used  . Tobacco comment: quit in 1980  Substance and Sexual Activity  . Alcohol use: Yes    Alcohol/week: 0.0 oz    Comment: on occasion  . Drug use: No  . Sexual activity: Yes  Other Topics Concern  . None  Social History Narrative  . None   Past Medical History:  Diagnosis Date  . Allergy   . Asthma   . Carpal tunnel syndrome   . Eczema   . Globus pharyngeus   . Hyperlipidemia   . Hypertension   . Laryngopharyngeal reflux   . Menopause   . Neuroma    right foot  . Osteoporosis   . Raynaud's disease   . Rhinitis due to pollen   . SOB (shortness of breath)    Past Surgical History:  Procedure Laterality Date  . PARTIAL HYSTERECTOMY      Review of Systems  All other systems reviewed and are negative.   Per HPI unless specifically indicated above     Objective:    BP 127/85   Pulse  99   Temp 98.5 F (36.9 C) (Oral)   Ht 5\' 3"  (1.6 m)   Wt 150 lb 6.4 oz (68.2 kg)   LMP 07/31/1981 (Approximate)   SpO2 98%   BMI 26.64 kg/m   Wt Readings from Last 3 Encounters:  03/06/18 150 lb 6.4 oz (68.2 kg)  02/19/18 152 lb 4 oz (69.1 kg)  02/18/18 149 lb 3.2 oz (67.7 kg)    Physical Exam  Constitutional: She is oriented to person, place, and time. She appears well-developed and well-nourished.  HENT:  Head: Normocephalic and atraumatic.  Eyes: Pupils are equal, round, and reactive to light. Right eye exhibits no discharge. Left eye exhibits no discharge. No scleral icterus.  Neck: Normal range of motion. Neck supple. Carotid bruit is not present. No thyromegaly present.  Cardiovascular: Normal rate, regular rhythm and normal heart sounds. Exam reveals no gallop and no friction rub.  No murmur  heard. Pulmonary/Chest: Effort normal and breath sounds normal. No respiratory distress. She has no wheezes. She has no rales.  Abdominal: Soft. Bowel sounds are normal. There is no tenderness. There is no rebound.  Genitourinary: No breast swelling, tenderness or discharge.  Musculoskeletal: Normal range of motion.  Lymphadenopathy:    She has no cervical adenopathy.  Neurological: She is alert and oriented to person, place, and time.  Skin: Skin is warm, dry and intact. No rash noted.  Psychiatric: She has a normal mood and affect. Her speech is normal and behavior is normal. Judgment and thought content normal. Cognition and memory are normal.    Results for orders placed or performed in visit on 01/25/18  Veritor Flu A/B Waived  Result Value Ref Range   Influenza A Positive (A) Negative   Influenza B Negative Negative      Assessment & Plan:   Problem List Items Addressed This Visit      Unprioritized   Advanced care planning/counseling discussion    A voluntary discussion about advance care planning including the explanation and discussion of advance directives was extensively discussed  with the patient.  Explanation about the health care proxy and Living will was reviewed and packet with forms with explanation of how to fill them out was given.  During this discussion, the patient was able to identify a health care proxy as her husband and plans to fill out the paperwork required.  Patient was offered a separate [place] visit for further assistance with forms.  Time spent:  20 minutes      Individuals present: patient       Arthritis of right shoulder region    Reviewed x-ray showing OA of shoulder.  She is being followed by Orthopedics.  Continue current medications and MRI through Orthopedics      Relevant Medications   cyclobenzaprine (FLEXERIL) 10 MG tablet   traMADol (ULTRAM) 50 MG tablet   Benign essential HTN    Stable, continue present medications.         Relevant Orders   Comprehensive metabolic panel   Hyperlipidemia    Stable.  Check lipid panel.  Continue current medications if at goal        Relevant Orders   Lipid Panel w/o Chol/HDL Ratio   Menopause    Shared decision making about continuing HRT.  Discussed process of weaning off if tolerated.  For now, she would like to continue.  Risk/benefits discussed         Mammogram Pending TD due 2020 Colonoscopy due 2026  Required immunizations up to date  Follow up plan: No Follow-up on file.

## 2018-03-06 NOTE — Assessment & Plan Note (Signed)
Reviewed x-ray showing OA of shoulder.  She is being followed by Orthopedics.  Continue current medications and MRI through Orthopedics

## 2018-03-06 NOTE — Assessment & Plan Note (Addendum)
Stable.  Check lipid panel.  Continue current medications if at goal

## 2018-03-07 ENCOUNTER — Encounter: Payer: Self-pay | Admitting: Unknown Physician Specialty

## 2018-03-07 LAB — COMPREHENSIVE METABOLIC PANEL
ALK PHOS: 64 IU/L (ref 39–117)
ALT: 10 IU/L (ref 0–32)
AST: 16 IU/L (ref 0–40)
Albumin/Globulin Ratio: 1.3 (ref 1.2–2.2)
Albumin: 3.8 g/dL (ref 3.5–4.8)
BUN / CREAT RATIO: 19 (ref 12–28)
BUN: 21 mg/dL (ref 8–27)
Bilirubin Total: <0.2 mg/dL (ref 0.0–1.2)
CALCIUM: 9.6 mg/dL (ref 8.7–10.3)
CO2: 25 mmol/L (ref 20–29)
CREATININE: 1.08 mg/dL — AB (ref 0.57–1.00)
Chloride: 97 mmol/L (ref 96–106)
GFR, EST AFRICAN AMERICAN: 60 mL/min/{1.73_m2} (ref >59)
GFR, EST NON AFRICAN AMERICAN: 52 mL/min/{1.73_m2} — AB (ref >59)
GLOBULIN, TOTAL: 2.9 g/dL (ref 1.5–4.5)
GLUCOSE: 82 mg/dL (ref 65–99)
Potassium: 3.9 mmol/L (ref 3.5–5.2)
SODIUM: 138 mmol/L (ref 134–144)
TOTAL PROTEIN: 6.7 g/dL (ref 6.0–8.5)

## 2018-03-07 LAB — LIPID PANEL W/O CHOL/HDL RATIO
CHOLESTEROL TOTAL: 215 mg/dL — AB (ref 100–199)
HDL: 81 mg/dL (ref >39)
LDL Calculated: 109 mg/dL — ABNORMAL HIGH (ref 0–99)
Triglycerides: 127 mg/dL (ref 0–149)
VLDL CHOLESTEROL CAL: 25 mg/dL (ref 5–40)

## 2018-03-08 ENCOUNTER — Encounter: Payer: Self-pay | Admitting: Unknown Physician Specialty

## 2018-03-11 DIAGNOSIS — J301 Allergic rhinitis due to pollen: Secondary | ICD-10-CM | POA: Diagnosis not present

## 2018-03-12 DIAGNOSIS — M7541 Impingement syndrome of right shoulder: Secondary | ICD-10-CM | POA: Diagnosis not present

## 2018-03-15 DIAGNOSIS — J301 Allergic rhinitis due to pollen: Secondary | ICD-10-CM | POA: Diagnosis not present

## 2018-03-18 DIAGNOSIS — M7541 Impingement syndrome of right shoulder: Secondary | ICD-10-CM | POA: Diagnosis not present

## 2018-03-20 DIAGNOSIS — M7541 Impingement syndrome of right shoulder: Secondary | ICD-10-CM | POA: Diagnosis not present

## 2018-03-21 DIAGNOSIS — M7541 Impingement syndrome of right shoulder: Secondary | ICD-10-CM | POA: Diagnosis not present

## 2018-03-21 DIAGNOSIS — M7521 Bicipital tendinitis, right shoulder: Secondary | ICD-10-CM | POA: Diagnosis not present

## 2018-03-22 ENCOUNTER — Telehealth: Payer: Self-pay | Admitting: Unknown Physician Specialty

## 2018-03-22 NOTE — Telephone Encounter (Signed)
Spoke with the patient scheduled the surgical clearance appt

## 2018-03-22 NOTE — Telephone Encounter (Signed)
Copied from [place] [id number]. Topic: Quick Communication - See Telephone Encounter >> Mar 22, 2018  3:16 PM Bea Graff, NT wrote: CRM for notification. See Telephone encounter for: 03/22/18. Pt calling to check status on her med clearance form that was faxed over today for her to have surgery on 04/08/18. She also wants to know if Kathrine Haddock needs her to make an appointment in order to fill this out.

## 2018-03-25 DIAGNOSIS — J301 Allergic rhinitis due to pollen: Secondary | ICD-10-CM | POA: Diagnosis not present

## 2018-03-27 ENCOUNTER — Ambulatory Visit: Payer: Self-pay | Admitting: Orthopedic Surgery

## 2018-03-27 DIAGNOSIS — Z1231 Encounter for screening mammogram for malignant neoplasm of breast: Secondary | ICD-10-CM | POA: Diagnosis not present

## 2018-03-27 LAB — HM MAMMOGRAPHY

## 2018-03-28 DIAGNOSIS — M7521 Bicipital tendinitis, right shoulder: Secondary | ICD-10-CM | POA: Diagnosis not present

## 2018-03-28 DIAGNOSIS — M7541 Impingement syndrome of right shoulder: Secondary | ICD-10-CM | POA: Diagnosis not present

## 2018-04-01 ENCOUNTER — Encounter
Admission: RE | Admit: 2018-04-01 | Discharge: 2018-04-01 | Disposition: A | Discharge status: Home / Self Care | Payer: Medicare Other | Source: Ambulatory Visit | Attending: Orthopedic Surgery | Admitting: Orthopedic Surgery

## 2018-04-01 ENCOUNTER — Other Ambulatory Visit: Payer: Self-pay

## 2018-04-01 DIAGNOSIS — R000 Tachycardia, unspecified: Secondary | ICD-10-CM | POA: Diagnosis not present

## 2018-04-01 DIAGNOSIS — R9431 Abnormal electrocardiogram [ECG] [EKG]: Secondary | ICD-10-CM | POA: Insufficient documentation

## 2018-04-01 DIAGNOSIS — Z01810 Encounter for preprocedural cardiovascular examination: Secondary | ICD-10-CM | POA: Insufficient documentation

## 2018-04-01 DIAGNOSIS — I10 Essential (primary) hypertension: Secondary | ICD-10-CM | POA: Insufficient documentation

## 2018-04-01 DIAGNOSIS — Z01812 Encounter for preprocedural laboratory examination: Secondary | ICD-10-CM | POA: Diagnosis not present

## 2018-04-01 DIAGNOSIS — J301 Allergic rhinitis due to pollen: Secondary | ICD-10-CM | POA: Diagnosis not present

## 2018-04-01 LAB — CBC
HCT: 37.1 % (ref 35.0–47.0)
Hemoglobin: 12.3 g/dL (ref 12.0–16.0)
MCH: 27.1 pg (ref 26.0–34.0)
MCHC: 33.1 g/dL (ref 32.0–36.0)
MCV: 81.7 fL (ref 80.0–100.0)
PLATELETS: 349 10*3/uL (ref 150–440)
RBC: 4.54 MIL/uL (ref 3.80–5.20)
RDW: 14.7 % — AB (ref 11.5–14.5)
WBC: 6.1 10*3/uL (ref 3.6–11.0)

## 2018-04-01 NOTE — Patient Instructions (Addendum)
Your procedure is scheduled on: Monday, April 08, 2018 Report to Day Surgery on the 2nd floor of the [facility]. To find out your arrival time, please call [phone] between 1PM - 3PM on: Friday, April 05, 2018  REMEMBER: Instructions that are not followed completely may result in serious medical risk, up to and including death; or upon the discretion of your surgeon and anesthesiologist your surgery may need to be rescheduled.  Do not eat food after midnight the night before your procedure.  No gum chewing, lozengers or hard candies.  You may however, drink CLEAR liquids up to 2 hours before you are scheduled to arrive for your surgery. Do not drink anything within 2 hours of the start of your surgery.  Clear liquids include: - water  - apple juice without pulp - clear gatorade - black coffee or tea (Do NOT add anything to the coffee or tea) Do NOT drink anything that is not on this list.  No Alcohol for 24 hours before or after surgery.  No Smoking including e-cigarettes for 24 hours prior to surgery.  No chewable tobacco products for at least 6 hours prior to surgery.  No nicotine patches on the day of surgery.  On the morning of surgery brush your teeth with toothpaste and water, you may rinse your mouth with mouthwash if you wish. Do not swallow any toothpaste or mouthwash.  Notify your doctor if there is any change in your medical condition (cold, fever, infection).  Do not wear jewelry, make-up, hairpins, clips or nail polish.  Do not wear lotions, powders, or perfumes. You may wear deodorant.  Do not shave 48 hours prior to surgery.   Contacts and dentures may not be worn into surgery.  Do not bring valuables to the hospital, including drivers license, insurance or credit cards.  [facility] is not responsible for any belongings or valuables.   TAKE THESE MEDICATIONS THE MORNING OF SURGERY:  1.  ADVAIR INHALER 2.  ALBUTEROL INHALER 3.  TRAMADOL (if needed  for pain)  Use CHG Soap as directed on instruction sheet.  Use inhalers on the day of surgery and bring to the hospital.  NOW!  Stop aspirin and  Anti-inflammatories (NSAIDS) such as Advil, Aleve, Ibuprofen, Motrin, Naproxen, Naprosyn and Aspirin based products such as Excedrin, Goodys Powder, BC Powder. (May take TRAMADOL or Tylenol or Acetaminophen if needed.)  NOW!  Stop ANY OVER THE COUNTER supplements until after surgery.  Wear comfortable clothing (specific to your surgery type) to the hospital.  Plan for stool softeners for home use.  If you are being discharged the day of surgery, you will not be allowed to drive home. You will need a responsible adult to drive you home and stay with you that night.   If you are taking public transportation, you will need to have a responsible adult with you. Please confirm with your physician that it is acceptable to use public transportation.   Please call [phone] if you have any questions about these instructions.

## 2018-04-01 NOTE — Pre-Procedure Instructions (Signed)
AS INSTRUCTED BY DR Donata Duff, EKG/REQUEST FOR CLEARANCE FAXED TO [place] NP AND SPOKE WITH ANGIE. ALSO CALLED AND FAXED TO Lilyanah Celestin AT DR Alvira Philips

## 2018-04-02 ENCOUNTER — Ambulatory Visit (INDEPENDENT_AMBULATORY_CARE_PROVIDER_SITE_OTHER): Payer: Medicare Other | Admitting: Unknown Physician Specialty

## 2018-04-02 ENCOUNTER — Encounter: Payer: Self-pay | Admitting: Unknown Physician Specialty

## 2018-04-02 VITALS — BP 129/85 | HR 105 | Temp 98.6°F | Ht 63.0 in | Wt 153.3 lb

## 2018-04-02 DIAGNOSIS — J45909 Unspecified asthma, uncomplicated: Secondary | ICD-10-CM | POA: Diagnosis not present

## 2018-04-02 DIAGNOSIS — E782 Mixed hyperlipidemia: Secondary | ICD-10-CM

## 2018-04-02 DIAGNOSIS — Z01818 Encounter for other preprocedural examination: Secondary | ICD-10-CM | POA: Diagnosis not present

## 2018-04-02 NOTE — Assessment & Plan Note (Signed)
Stable, continue present medications.   

## 2018-04-02 NOTE — Progress Notes (Signed)
BP 129/85 (BP Location: Left Arm, Cuff Size: Normal)   Pulse (!) 105   Temp 98.6 F (37 C) (Oral)   Ht 5\' 3"  (1.6 m)   Wt 153 lb 4.8 oz (69.5 kg)   LMP 07/31/1981 (Approximate)   SpO2 96%   BMI 27.16 kg/m    Subjective:    Patient ID: Nancy Mack, female    DOB: 1946/10/26, 72 y.o.   MRN: [id number]  HPI: Nancy Mack is a 72 y.o. female  Chief Complaint  Patient presents with  . Surgical Clearance    surgery scheduled for 04/08/18, had pre admit testing yesterday   Pt is planning on shoulder surgery secondary to right rotator cuff tear.  She is asked to get prior authorization due to abnormal EKG. No STTW changes.  Pt without complaints of chest pain or DOE.  Able to climb 2 flights of stairs without resting.  No problems in the past with anesthesia  EKG tracing:  with sinus tach of 108 and low voltage.  Compared to previous EKG.  Dr. [name] agrees with assessment  CBC and CMP reviewed   Relevant past medical, surgical, family and social history reviewed and updated as indicated. Interim medical history since our last visit reviewed. Allergies and medications reviewed and updated.  Review of Systems  All other systems reviewed and are negative.   Per HPI unless specifically indicated above     Objective:    BP 129/85 (BP Location: Left Arm, Cuff Size: Normal)   Pulse (!) 105   Temp 98.6 F (37 C) (Oral)   Ht 5\' 3"  (1.6 m)   Wt 153 lb 4.8 oz (69.5 kg)   LMP 07/31/1981 (Approximate)   SpO2 96%   BMI 27.16 kg/m   Wt Readings from Last 3 Encounters:  04/02/18 153 lb 4.8 oz (69.5 kg)  04/01/18 150 lb (68 kg)  03/06/18 150 lb 6.4 oz (68.2 kg)    Physical Exam  Constitutional: She is oriented to person, place, and time. She appears well-developed and well-nourished. No distress.  HENT:  Head: Normocephalic and atraumatic.  Eyes: Conjunctivae and lids are normal. Right eye exhibits no discharge. Left eye exhibits no discharge. No scleral icterus.    Neck: Normal range of motion. Neck supple. No JVD present. Carotid bruit is not present.  Cardiovascular: Normal rate, regular rhythm and normal heart sounds.  Pulmonary/Chest: Effort normal and breath sounds normal.  Abdominal: Normal appearance. There is no splenomegaly or hepatomegaly.  Musculoskeletal: Normal range of motion.  Neurological: She is alert and oriented to person, place, and time.  Skin: Skin is warm, dry and intact. No rash noted. No pallor.  Psychiatric: She has a normal mood and affect. Her behavior is normal. Judgment and thought content normal.    Results for orders placed or performed during the hospital encounter of 04/01/18  CBC  Result Value Ref Range   WBC 6.1 3.6 - 11.0 K/uL   RBC 4.54 3.80 - 5.20 MIL/uL   Hemoglobin 12.3 12.0 - 16.0 g/dL   HCT 37.1 35.0 - 47.0 %   MCV 81.7 80.0 - 100.0 fL   MCH 27.1 26.0 - 34.0 pg   MCHC 33.1 32.0 - 36.0 g/dL   RDW 14.7 (H) 11.5 - 14.5 %   Platelets 349 150 - 440 K/uL  `    Assessment & Plan:   Problem List Items Addressed This Visit      Unprioritized   Asthma  Stable.  OK for surgery      Hyperlipidemia    Stable, continue present medications.         Other Visit Diagnoses    Preoperative evaluation to rule out surgical contraindication    -  Primary   EKg without acute changes.  OK for shoulder surgery,       Follow up plan: As needed

## 2018-04-02 NOTE — Assessment & Plan Note (Signed)
Stable.  OK for surgery

## 2018-04-03 NOTE — Pre-Procedure Instructions (Signed)
CLEARED LOW RISK 04/02/18 BY PCP.

## 2018-04-07 MED ORDER — CEFAZOLIN SODIUM-DEXTROSE 2-4 GM/100ML-% IV SOLN
2.0000 g | INTRAVENOUS | Status: AC
  Filled 2018-04-07: qty 100

## 2018-04-08 ENCOUNTER — Encounter: Payer: Self-pay | Admitting: *Deleted

## 2018-04-08 ENCOUNTER — Ambulatory Visit: Payer: Medicare Other | Admitting: Registered Nurse

## 2018-04-08 ENCOUNTER — Ambulatory Visit
Admission: RE | Admit: 2018-04-08 | Discharge: 2018-04-08 | Disposition: A | Discharge status: Home / Self Care | Payer: Medicare Other | Source: Ambulatory Visit | Attending: Orthopedic Surgery | Admitting: Orthopedic Surgery

## 2018-04-08 ENCOUNTER — Other Ambulatory Visit: Payer: Self-pay

## 2018-04-08 ENCOUNTER — Encounter
Admission: RE | Disposition: A | Discharge status: Home / Self Care | Payer: Self-pay | Source: Ambulatory Visit | Attending: Orthopedic Surgery

## 2018-04-08 DIAGNOSIS — K219 Gastro-esophageal reflux disease without esophagitis: Secondary | ICD-10-CM | POA: Diagnosis not present

## 2018-04-08 DIAGNOSIS — J45909 Unspecified asthma, uncomplicated: Secondary | ICD-10-CM | POA: Insufficient documentation

## 2018-04-08 DIAGNOSIS — M7541 Impingement syndrome of right shoulder: Secondary | ICD-10-CM | POA: Insufficient documentation

## 2018-04-08 DIAGNOSIS — M25511 Pain in right shoulder: Secondary | ICD-10-CM | POA: Diagnosis not present

## 2018-04-08 DIAGNOSIS — M7521 Bicipital tendinitis, right shoulder: Secondary | ICD-10-CM | POA: Insufficient documentation

## 2018-04-08 DIAGNOSIS — Z87891 Personal history of nicotine dependence: Secondary | ICD-10-CM | POA: Insufficient documentation

## 2018-04-08 DIAGNOSIS — I739 Peripheral vascular disease, unspecified: Secondary | ICD-10-CM | POA: Diagnosis not present

## 2018-04-08 DIAGNOSIS — Z79899 Other long term (current) drug therapy: Secondary | ICD-10-CM | POA: Diagnosis not present

## 2018-04-08 DIAGNOSIS — I10 Essential (primary) hypertension: Secondary | ICD-10-CM | POA: Insufficient documentation

## 2018-04-08 DIAGNOSIS — G8918 Other acute postprocedural pain: Secondary | ICD-10-CM | POA: Diagnosis not present

## 2018-04-08 DIAGNOSIS — M7551 Bursitis of right shoulder: Secondary | ICD-10-CM | POA: Diagnosis not present

## 2018-04-08 HISTORY — PX: SHOULDER ARTHROSCOPY WITH BICEPSTENOTOMY: SHX6204

## 2018-04-08 SURGERY — SHOULDER ARTHROSCOPY WITH SUBACROMIAL DECOMPRESSION AND DISTAL CLAVICLE EXCISION
Anesthesia: General | Site: Shoulder | Laterality: Right | Wound class: "Clean "

## 2018-04-08 MED ORDER — BUPIVACAINE-EPINEPHRINE (PF) 0.25% -1:200000 IJ SOLN
INTRAMUSCULAR | Status: AC
  Filled 2018-04-08: qty 30

## 2018-04-08 MED ORDER — LIDOCAINE HCL (CARDIAC) PF 100 MG/5ML IV SOSY
PREFILLED_SYRINGE | INTRAVENOUS | Status: DC | PRN
  Administered 2018-04-08: 100 mg via INTRAVENOUS

## 2018-04-08 MED ORDER — ONDANSETRON HCL 4 MG/2ML IJ SOLN
INTRAMUSCULAR | Status: DC | PRN
  Administered 2018-04-08: 4 mg via INTRAVENOUS

## 2018-04-08 MED ORDER — HYDROCODONE-ACETAMINOPHEN 7.5-325 MG PO TABS: 1.0000 | ORAL_TABLET | ORAL | Status: DC | PRN

## 2018-04-08 MED ORDER — LIDOCAINE HCL (PF) 1 % IJ SOLN
INTRAMUSCULAR | Status: AC
  Filled 2018-04-08: qty 5

## 2018-04-08 MED ORDER — ONDANSETRON HCL 4 MG/2ML IJ SOLN
4.0000 mg | Freq: Four times a day (QID) | INTRAMUSCULAR | Status: DC | PRN
Start: 2018-04-08 — End: 2018-04-08

## 2018-04-08 MED ORDER — EPINEPHRINE 30 MG/30ML IJ SOLN
INTRAMUSCULAR | Status: AC
  Filled 2018-04-08: qty 1

## 2018-04-08 MED ORDER — BUPIVACAINE-EPINEPHRINE (PF) 0.25% -1:200000 IJ SOLN
INTRAMUSCULAR | Status: DC | PRN
  Administered 2018-04-08: 30 mL via PERINEURAL

## 2018-04-08 MED ORDER — MIDAZOLAM HCL 2 MG/2ML IJ SOLN
INTRAMUSCULAR | Status: AC
  Filled 2018-04-08: qty 2

## 2018-04-08 MED ORDER — FENTANYL CITRATE (PF) 100 MCG/2ML IJ SOLN
INTRAMUSCULAR | Status: AC
  Filled 2018-04-08: qty 2

## 2018-04-08 MED ORDER — MORPHINE SULFATE (PF) 4 MG/ML IV SOLN: 0.5000 mg | INTRAVENOUS | Status: DC | PRN

## 2018-04-08 MED ORDER — ONDANSETRON HCL 4 MG PO TABS: 4.0000 mg | ORAL_TABLET | Freq: Once | ORAL | Status: DC

## 2018-04-08 MED ORDER — PHENYLEPHRINE HCL 10 MG/ML IJ SOLN
INTRAMUSCULAR | Status: DC | PRN
  Administered 2018-04-08 (×3): 100 ug via INTRAVENOUS

## 2018-04-08 MED ORDER — CEFAZOLIN SODIUM-DEXTROSE 2-3 GM-%(50ML) IV SOLR
INTRAVENOUS | Status: DC | PRN
  Administered 2018-04-08: 2 g via INTRAVENOUS

## 2018-04-08 MED ORDER — PHENOL 1.4 % MT LIQD: 1.0000 | OROMUCOSAL | Status: DC | PRN

## 2018-04-08 MED ORDER — BUPIVACAINE HCL (PF) 0.5 % IJ SOLN
INTRAMUSCULAR | Status: AC
  Filled 2018-04-08: qty 10

## 2018-04-08 MED ORDER — ROCURONIUM BROMIDE 100 MG/10ML IV SOLN
INTRAVENOUS | Status: DC | PRN
  Administered 2018-04-08: 5 mg via INTRAVENOUS

## 2018-04-08 MED ORDER — KETOROLAC TROMETHAMINE 15 MG/ML IJ SOLN: 7.5000 mg | Freq: Four times a day (QID) | INTRAMUSCULAR | Status: DC

## 2018-04-08 MED ORDER — DOCUSATE SODIUM 100 MG PO CAPS: 100.0000 mg | ORAL_CAPSULE | Freq: Two times a day (BID) | ORAL | Status: DC

## 2018-04-08 MED ORDER — CHLORHEXIDINE GLUCONATE 4 % EX LIQD: 60.0000 mL | Freq: Once | CUTANEOUS | Status: DC

## 2018-04-08 MED ORDER — LIDOCAINE HCL (PF) 2 % IJ SOLN
INTRAMUSCULAR | Status: AC
  Filled 2018-04-08: qty 10

## 2018-04-08 MED ORDER — SUCCINYLCHOLINE CHLORIDE 20 MG/ML IJ SOLN
INTRAMUSCULAR | Status: DC | PRN
  Administered 2018-04-08: 120 mg via INTRAVENOUS

## 2018-04-08 MED ORDER — ACETAMINOPHEN 500 MG PO TABS
1000.0000 mg | ORAL_TABLET | Freq: Once | ORAL | Status: AC
  Administered 2018-04-08: 1000 mg via ORAL

## 2018-04-08 MED ORDER — PROPOFOL 10 MG/ML IV BOLUS
INTRAVENOUS | Status: DC | PRN
  Administered 2018-04-08: 150 mg via INTRAVENOUS

## 2018-04-08 MED ORDER — FENTANYL CITRATE (PF) 100 MCG/2ML IJ SOLN
INTRAMUSCULAR | Status: AC
  Administered 2018-04-08: 50 ug via INTRAVENOUS
  Filled 2018-04-08: qty 2

## 2018-04-08 MED ORDER — FAMOTIDINE 20 MG PO TABS
20.0000 mg | ORAL_TABLET | Freq: Once | ORAL | Status: AC
  Administered 2018-04-08: 20 mg via ORAL

## 2018-04-08 MED ORDER — CEFAZOLIN SODIUM-DEXTROSE 2-3 GM-%(50ML) IV SOLR
INTRAVENOUS | Status: AC
  Filled 2018-04-08: qty 50

## 2018-04-08 MED ORDER — DEXAMETHASONE SODIUM PHOSPHATE 10 MG/ML IJ SOLN
INTRAMUSCULAR | Status: DC | PRN
  Administered 2018-04-08: 5 mg via INTRAVENOUS

## 2018-04-08 MED ORDER — MIDAZOLAM HCL 2 MG/2ML IJ SOLN
1.0000 mg | Freq: Once | INTRAMUSCULAR | Status: AC
  Administered 2018-04-08: 1 mg via INTRAVENOUS

## 2018-04-08 MED ORDER — FAMOTIDINE 20 MG PO TABS
ORAL_TABLET | ORAL | Status: AC
  Filled 2018-04-08: qty 1

## 2018-04-08 MED ORDER — BUPIVACAINE HCL (PF) 0.5 % IJ SOLN
INTRAMUSCULAR | Status: DC | PRN
  Administered 2018-04-08: 10 mL via PERINEURAL

## 2018-04-08 MED ORDER — METOCLOPRAMIDE HCL 10 MG PO TABS: 5.0000 mg | ORAL_TABLET | Freq: Three times a day (TID) | ORAL | Status: DC | PRN

## 2018-04-08 MED ORDER — MENTHOL 3 MG MT LOZG: 1.0000 | LOZENGE | OROMUCOSAL | Status: DC | PRN

## 2018-04-08 MED ORDER — GABAPENTIN 300 MG PO CAPS
ORAL_CAPSULE | ORAL | Status: AC
  Filled 2018-04-08: qty 1

## 2018-04-08 MED ORDER — ONDANSETRON HCL 4 MG PO TABS
4.0000 mg | ORAL_TABLET | Freq: Four times a day (QID) | ORAL | Status: DC | PRN
  Administered 2018-04-08: 4 mg via ORAL

## 2018-04-08 MED ORDER — ACETAMINOPHEN 500 MG PO TABS
ORAL_TABLET | ORAL | Status: AC
  Filled 2018-04-08: qty 2

## 2018-04-08 MED ORDER — FENTANYL CITRATE (PF) 100 MCG/2ML IJ SOLN
INTRAMUSCULAR | Status: DC | PRN
  Administered 2018-04-08 (×2): 50 ug via INTRAVENOUS

## 2018-04-08 MED ORDER — HYDROCODONE-ACETAMINOPHEN 5-325 MG PO TABS: 1.0000 | ORAL_TABLET | ORAL | 0 refills | Status: DC | PRN

## 2018-04-08 MED ORDER — DEXAMETHASONE SODIUM PHOSPHATE 10 MG/ML IJ SOLN
INTRAMUSCULAR | Status: AC
  Filled 2018-04-08: qty 1

## 2018-04-08 MED ORDER — ONDANSETRON HCL 4 MG PO TABS
ORAL_TABLET | ORAL | Status: AC
  Filled 2018-04-08: qty 1

## 2018-04-08 MED ORDER — ONDANSETRON HCL 4 MG/2ML IJ SOLN
INTRAMUSCULAR | Status: AC
  Filled 2018-04-08: qty 2

## 2018-04-08 MED ORDER — LACTATED RINGERS IV SOLN
INTRAVENOUS | Status: DC
  Administered 2018-04-08 (×2): via INTRAVENOUS

## 2018-04-08 MED ORDER — FENTANYL CITRATE (PF) 100 MCG/2ML IJ SOLN
50.0000 ug | Freq: Once | INTRAMUSCULAR | Status: AC
  Administered 2018-04-08: 50 ug via INTRAVENOUS

## 2018-04-08 MED ORDER — CEFAZOLIN SODIUM-DEXTROSE 2-3 GM-%(50ML) IV SOLR: INTRAVENOUS | Status: DC | PRN

## 2018-04-08 MED ORDER — GABAPENTIN 300 MG PO CAPS
300.0000 mg | ORAL_CAPSULE | Freq: Once | ORAL | Status: AC
  Administered 2018-04-08: 300 mg via ORAL

## 2018-04-08 MED ORDER — DOCUSATE SODIUM 100 MG PO CAPS: 100.0000 mg | ORAL_CAPSULE | Freq: Every day | ORAL | 2 refills | Status: DC | PRN

## 2018-04-08 MED ORDER — BUPIVACAINE LIPOSOME 1.3 % IJ SUSP
INTRAMUSCULAR | Status: AC
  Filled 2018-04-08: qty 20

## 2018-04-08 MED ORDER — ACETAMINOPHEN 325 MG PO TABS: 325.0000 mg | ORAL_TABLET | Freq: Four times a day (QID) | ORAL | Status: DC | PRN

## 2018-04-08 MED ORDER — METOCLOPRAMIDE HCL 5 MG/ML IJ SOLN: 5.0000 mg | Freq: Three times a day (TID) | INTRAMUSCULAR | Status: DC | PRN

## 2018-04-08 MED ORDER — FENTANYL CITRATE (PF) 100 MCG/2ML IJ SOLN: 25.0000 ug | INTRAMUSCULAR | Status: DC | PRN

## 2018-04-08 MED ORDER — MIDAZOLAM HCL 2 MG/2ML IJ SOLN
INTRAMUSCULAR | Status: AC
  Administered 2018-04-08: 1 mg via INTRAVENOUS
  Filled 2018-04-08: qty 2

## 2018-04-08 MED ORDER — BUPIVACAINE LIPOSOME 1.3 % IJ SUSP
INTRAMUSCULAR | Status: DC | PRN
  Administered 2018-04-08: 20 mL via PERINEURAL

## 2018-04-08 MED ORDER — PROPOFOL 10 MG/ML IV BOLUS
INTRAVENOUS | Status: AC
  Filled 2018-04-08: qty 20

## 2018-04-08 MED ORDER — SODIUM CHLORIDE 0.9 % IV SOLN
INTRAVENOUS | Status: DC | PRN
  Administered 2018-04-08: 20 ug/min via INTRAVENOUS

## 2018-04-08 MED ORDER — LACTATED RINGERS IV SOLN: INTRAVENOUS | Status: DC

## 2018-04-08 MED ORDER — LIDOCAINE HCL (PF) 1 % IJ SOLN
INTRAMUSCULAR | Status: DC | PRN
  Administered 2018-04-08: 3 mL

## 2018-04-08 MED ORDER — ONDANSETRON HCL 4 MG/2ML IJ SOLN: 4.0000 mg | Freq: Once | INTRAMUSCULAR | Status: DC | PRN

## 2018-04-08 MED ORDER — HYDROCODONE-ACETAMINOPHEN 5-325 MG PO TABS: 1.0000 | ORAL_TABLET | ORAL | Status: DC | PRN

## 2018-04-08 SURGICAL SUPPLY — 50 items
ADAPTER IRRIG TUBE 2 SPIKE SOL (ADAPTER) ×6 IMPLANT
BLADE FULL RADIUS 3.5 (BLADE) ×3 IMPLANT
BLADE INCISOR PLUS 4.5 (BLADE) ×1 IMPLANT
BLADE SURG MINI STRL (BLADE) IMPLANT
BRUSH SCRUB EZ  4% CHG (MISCELLANEOUS) ×2
BRUSH SCRUB EZ 4% CHG (MISCELLANEOUS) ×1 IMPLANT
BUR ACROMIONIZER 4.0 (BURR) ×2 IMPLANT
BUR BR 5.5 WIDE MOUTH (BURR) IMPLANT
CANNULA 5.75X7 CRYSTAL CLEAR (CANNULA) ×3 IMPLANT
CANNULA PARTIAL THREAD 2X7 (CANNULA) ×1 IMPLANT
CANNULA TWIST IN 8.25X9CM (CANNULA) ×1 IMPLANT
COOLER POLAR GLACIER W/PUMP (MISCELLANEOUS) ×1 IMPLANT
CRADLE LAMINECT ARM (MISCELLANEOUS) ×6 IMPLANT
DRAPE IMP U-DRAPE 54X76 (DRAPES) ×6 IMPLANT
DRAPE INCISE IOBAN 66X45 STRL (DRAPES) ×3 IMPLANT
DRAPE SHEET LG 3/4 BI-LAMINATE (DRAPES) ×3 IMPLANT
DRAPE STERI 35X30 U-POUCH (DRAPES) ×3 IMPLANT
DRAPE U-SHAPE 47X51 STRL (DRAPES) ×3 IMPLANT
GAUZE PETRO XEROFOAM 1X8 (MISCELLANEOUS) ×3 IMPLANT
GAUZE SPONGE 4X4 12PLY STRL (GAUZE/BANDAGES/DRESSINGS) ×3 IMPLANT
GLOVE BIOGEL PI IND STRL 8 (GLOVE) ×1 IMPLANT
GLOVE BIOGEL PI INDICATOR 8 (GLOVE) ×2
GLOVE SURG ORTHO 8.0 STRL STRW (GLOVE) ×3 IMPLANT
GOWN STRL REUS W/ TWL LRG LVL3 (GOWN DISPOSABLE) ×2 IMPLANT
GOWN STRL REUS W/TWL LRG LVL3 (GOWN DISPOSABLE) ×4
IV LACTATED RINGER IRRG 3000ML (IV SOLUTION) ×8
IV LR IRRIG 3000ML ARTHROMATIC (IV SOLUTION) ×6 IMPLANT
KIT STABILIZATION SHOULDER (MISCELLANEOUS) ×3 IMPLANT
KIT TURNOVER KIT A (KITS) ×3 IMPLANT
MANIFOLD NEPTUNE II (INSTRUMENTS) ×3 IMPLANT
MASK FACE SPIDER DISP (MASK) ×3 IMPLANT
MAT BLUE FLOOR 46X72 FLO (MISCELLANEOUS) ×6 IMPLANT
NDL SPNL 18GX3.5 QUINCKE PK (NEEDLE) ×1 IMPLANT
NEEDLE HYPO 22GX1.5 SAFETY (NEEDLE) ×3 IMPLANT
NEEDLE SPNL 18GX3.5 QUINCKE PK (NEEDLE) ×3 IMPLANT
PACK ARTHROSCOPY SHOULDER (MISCELLANEOUS) ×3 IMPLANT
PAD ABD DERMACEA PRESS 5X9 (GAUZE/BANDAGES/DRESSINGS) IMPLANT
PAD WRAPON POLAR SHDR XLG (MISCELLANEOUS) ×1 IMPLANT
SLING ARM M TX990204 (SOFTGOODS) ×2 IMPLANT
SLING ULTRA II LG (MISCELLANEOUS) ×1 IMPLANT
STRAP SAFETY 5IN WIDE (MISCELLANEOUS) ×3 IMPLANT
SUT ETHILON NAB PS2 4-0 18IN (SUTURE) ×3 IMPLANT
SYR 10ML LL (SYRINGE) ×3 IMPLANT
SYR 50ML LL SCALE MARK (SYRINGE) ×3 IMPLANT
TAPE MICROFOAM 4IN (TAPE) ×3 IMPLANT
TUBING ARTHRO INFLOW-ONLY STRL (TUBING) ×3 IMPLANT
TUBING CONNECTING 10 (TUBING) ×2 IMPLANT
TUBING CONNECTING 10' (TUBING) ×1
WAND HAND CNTRL MULTIVAC 90 (MISCELLANEOUS) ×3 IMPLANT
WRAPON POLAR PAD SHDR XLG (MISCELLANEOUS)

## 2018-04-08 NOTE — Transfer of Care (Signed)
Immediate Anesthesia Transfer of Care Note  Patient: Nancy Mack  Procedure(s) Performed: SHOULDER ARTHROSCOPY WITH SUBACROMIAL DECOMPRESSION AND DISTAL CLAVICLE EXCISION (Right Shoulder) SHOULDER ARTHROSCOPY WITH BICEPSTENOTOMY (Right Shoulder)  Patient Location: PACU  Anesthesia Type:General  Level of Consciousness: Sedated  Airway & Oxygen Therapy: Patient Spontanous Breathing  Post-op Assessment: Report given to RN  Post vital signs: stable  Last Vitals:  Vitals Value Taken Time  BP 136/74 04/08/2018 12:04 PM  Temp 36.4 C 04/08/2018 12:04 PM  Pulse 88 04/08/2018 12:09 PM  Resp 13 04/08/2018 12:09 PM  SpO2 100 % 04/08/2018 12:09 PM  Vitals shown include unvalidated device data.  Last Pain:  Vitals:   04/08/18 1204  TempSrc:   PainSc: Asleep         Complications: No apparent anesthesia complications

## 2018-04-08 NOTE — Anesthesia Post-op Follow-up Note (Signed)
Anesthesia QCDR form completed.        

## 2018-04-08 NOTE — H&P (Signed)
The patient has been re-examined, and the chart reviewed, and there have been no interval changes to the documented history and physical.  Plan a right shoulder arthroscopy today.  Anesthesia is consulted regarding a peripheral nerve block for post-operative pain.  The risks, benefits, and alternatives have been discussed at length, and the patient is willing to proceed.    

## 2018-04-08 NOTE — Discharge Instructions (Addendum)
Wear sling for comfort and while the nerve block is in effect    You may gradually begin using the arm for normal daily activities.  Keep the dressing dry.  You may remove bandage in 3 days.  You may place Band-Aids over top of the incisions.  May shower once dressing is removed in 3 days.   +++ Make sure to take some pain medication this evening before you fall asleep, in preparation for the nerve block wearing off in the middle of the night.  If the the pain medication causes itching, or is too strong, try taking a single tablet at a time, or combining with Benadryl.  You may be most comfortable sleeping in a recliner.  If you do sleep in near bed, placed pillows behind the shoulder that have the operation to support it.      Interscalene Nerve Block with Exparel  1.  For your surgery you have received an Interscalene Nerve Block with Exparel. 2. Nerve Blocks affect many types of nerves, including nerves that control movement, pain and normal sensation.  You may experience feelings such as numbness, tingling, heaviness, weakness or the inability to move your arm or the feeling or sensation that your arm has "fallen asleep". 3. A nerve block with Exparel can last up to 5 days.  Usually the weakness wears off first.  The tingling and heaviness usually wear off next.  Finally you may start to notice pain.  Keep in mind that this may occur in any order.  Once a nerve block starts to wear off it is usually completely gone within 60 minutes. 4. ISNB may cause mild shortness of breath, a hoarse voice, blurry vision, unequal pupils, or drooping of the face on the same side as the nerve block.  These symptoms will usually resolve with the numbness.  Very rarely the procedure itself can cause mild seizures. 5. If needed, your surgeon will give you a prescription for pain medication.  It will take about 60 minutes for the oral pain medication to become fully effective.  So, it is recommended that you  start taking this medication before the nerve block first begins to wear off, or when you first begin to feel discomfort. 6. Take your pain medication only as prescribed.  Pain medication can cause sedation and decrease your breathing if you take more than you need for the level of pain that you have. 7. Nausea is a common side effect of many pain medications.  You may want to eat something before taking your pain medicine to prevent nausea. 8. After an Interscalene nerve block, you cannot feel pain, pressure or extremes in temperature in the effected arm.  Because your arm is numb it is at an increased risk for injury.  To decrease the possibility of injury, please practice the following:  a. While you are awake change the position of your arm frequently to prevent too much pressure on any one area for prolonged periods of time. b.  If you have a cast or tight dressing, check the color or your fingers every couple of hours.  Call your surgeon with the appearance of any discoloration (white or blue). c. If you are given a sling to wear before you go home, please wear it  at all times until the block has completely worn off.  Do not get up at night without your sling. d. Please contact [facility] or your surgeon if you do not begin to regain sensation after  7 days from the surgery.  Anesthesia may be contacted by calling the Same Day Surgery Department, Mon. through Fri., 6 am to 4 pm at [phone].   e. If you experience any other problems or concerns, please contact your surgeon's office. f. If you experience severe or prolonged shortness of breath go to the nearest emergency department.    AMBULATORY SURGERY  DISCHARGE INSTRUCTIONS  1) The drugs that you were given will stay in your system until tomorrow so for the next 24 hours you should not: A) Drive an automobile B) Make any legal decisions C) Drink any alcoholic beverage  2) You may resume regular meals tomorrow.  Today it is  better to start with liquids and gradually work up to solid foods. You may eat anything you prefer, but it is better to start with liquids, then soup and crackers, and gradually work up to solid foods.  3) Please notify your doctor immediately if you have any unusual bleeding, trouble breathing, redness and pain at the surgery site, drainage, fever, or pain not relieved by medication.  4) Additional Instructions:  Please contact your physician with any problems or Same Day Surgery at [phone], Monday through Friday 6 am to 4 pm, or [place] at [facility] number at [phone].

## 2018-04-08 NOTE — Anesthesia Procedure Notes (Signed)
Anesthesia Regional Block: Interscalene brachial plexus block   Pre-Anesthetic Checklist: ,, timeout performed, Correct Patient, Correct Site, Correct Laterality, Correct Procedure, Correct Position, site marked, Risks and benefits discussed,  Surgical consent,  Pre-op evaluation,  At surgeon's request and post-op pain management  Laterality: Right  Prep: chloraprep       Needles:  Injection technique: Single-shot  Needle Type: Stimiplex     Needle Length: 10cm  Needle Gauge: 21     Additional Needles:   Procedures:,,,, ultrasound used (permanent image in chart),,,,  Narrative:  Start time: 04/08/2018 9:44 AM End time: 04/08/2018 9:49 AM Injection made incrementally with aspirations every 5 mL.  Performed by: Personally  Anesthesiologist: Emmie Niemann, MD  Additional Notes: Functioning IV was confirmed and monitors were applied.  A Stimuplex needle was used. Sterile prep and drape,hand hygiene and sterile gloves were used.  Negative aspiration and negative test dose prior to incremental administration of local anesthetic. The patient tolerated the procedure well.

## 2018-04-08 NOTE — Op Note (Signed)
04/08/2018  12:30 PM  PATIENT:  Nancy Mack    PRE-OPERATIVE DIAGNOSIS:  M75.41 Impingement syndrome of right shoulder  M75.21 Bicipital tendintis, right shoulder  POST-OPERATIVE DIAGNOSIS:  Same  PROCEDURE:  SHOULDER ARTHROSCOPY WITH SUBACROMIAL DECOMPRESSION AND DISTAL CLAVICLE EXCISION, SHOULDER ARTHROSCOPY WITH BICEPS TENOTOMY, RIGHT  SURGEON:  Lovell Sheehan, MD  ANESTHESIA:   General  PREOPERATIVE INDICATIONS:  Nancy Mack is a  72 y.o. female with a diagnosis of M75.41 Impingement syndrome of right shoulder  M75.21 Bicipital tendintis, right shoulder who failed conservative measures and elected for surgical management.    I discussed the risks and benefits of surgery. The risks include but are not limited to infection, bleeding requiring blood transfusion, nerve or blood vessel injury, joint stiffness or loss of motion, persistent pain, weakness or instability, malunion, nonunion and hardware failure and the need for further surgery. Medical risks include but are not limited to DVT and pulmonary embolism, myocardial infarction, stroke, pneumonia, respiratory failure and death. Patient understood these risks and wished to proceed.   OPERATIVE FINDINGS: The biceps tendon origin had extensive synovitis and degenerative fraying. There was mild articular sided fraying of the infraspinatus but no evidence of high-grade partial or full-thickness tear. Patient had a severe subacromial bursitis and advanced acromioclavicular joint arthrosis.  OPERATIVE PROCEDURE: The patient was met in the preoperative area. The left shoulder was signed with the word yes and my initials according the hospital's correct site of surgery protocol.  History and physical was updated. Patient was brought to the operating room where he underwent interscalene block and general anesthesia. The patient was placed in a beachchair position.  A spider arm positioner was used for this case. Examination under  anesthesia revealed no limitation of motion or instability with load shift testing. The patient had a negative sulcus sign.  Patient was prepped and draped in a sterile fashion. A timeout was performed to verify the patient's name, date of birth, medical record number, correct site of surgery and correct procedure to be performed there was also used to verify the patient received antibiotics that all appropriate instruments, implants and radiographs studies were available in the room. Once all in attendance were in agreement case began.  Bony landmarks were drawn out with a surgical marker along with proposed arthroscopy incisions. These were pre-injected with 1% lidocaine plain. An 11 blade was used to establish a posterior portal through which the arthroscope was placed in the glenohumeral joint. A full diagnostic examination of the shoulder was performed. Please see findings for a complete report. A biceps tenotomy was then performed with electrocautery and the synovitis, degenerative labrum and biceps stump were debrided with the shaver.  The arthroscope was then placed in the subacromial space.  Extensive bursitis was encountered. A lateral portal was established with an 18-gauge spinal needle for localization. A 90 ArthroCare wand and shaver blade were used to form an extensive subdeltoid and subacromial bursectomy. There was no evidence of a bursal sided tear of the supra or infraspinatus.   A subacromial decompression was performed using a 4.0 mm Acromionizer from the lateral portal. The 4.0 mm Acromionizer was then placed through the anterior portal.  A distal clavicle excision was performed. Subacromial space was then copiously irrigated to remove all osseous debris. Final arthroscopic images were taken. Arthroscopic images were then removed.  Skin closure for the arthroscopic incisions was performed with 4-0 nylon.   0.25% Marcaine plain was then injected into the subacromial space for  postoperative pain control. A dry sterile dressing was applied.  The patient was placed in a sling.  All sharp and it instrument counts were correct at the conclusion of the case. I was scrubbed and present for the entire case. I spoke with the patient's family postoperatively to let them know the case had been performed without complication and the patient was stable in recovery room.

## 2018-04-08 NOTE — Anesthesia Postprocedure Evaluation (Signed)
Anesthesia Post Note  Patient: Nancy Mack  Procedure(s) Performed: SHOULDER ARTHROSCOPY WITH SUBACROMIAL DECOMPRESSION AND DISTAL CLAVICLE EXCISION (Right Shoulder) SHOULDER ARTHROSCOPY WITH BICEPSTENOTOMY (Right Shoulder)  Patient location during evaluation: PACU Anesthesia Type: General Level of consciousness: awake and alert and oriented Pain management: pain level controlled Vital Signs Assessment: post-procedure vital signs reviewed and stable Respiratory status: spontaneous breathing, nonlabored ventilation and respiratory function stable Cardiovascular status: blood pressure returned to baseline and stable Postop Assessment: no signs of nausea or vomiting Anesthetic complications: no     Last Vitals:  Vitals:   04/08/18 1234 04/08/18 1237  BP: 132/79   Pulse: 91   Resp: 15   Temp:    SpO2: 100% 100%    Last Pain:  Vitals:   04/08/18 1237  TempSrc:   PainSc: 0-No pain                 Daquarius Dubeau

## 2018-04-08 NOTE — Anesthesia Preprocedure Evaluation (Signed)
Anesthesia Evaluation  Patient identified by MRN, date of birth, ID band Patient awake    Reviewed: Allergy & Precautions, H&P , NPO status , Patient's Chart, lab work & pertinent test results, reviewed documented beta blocker date and time   Airway Mallampati: II  TM Distance: >3 FB Neck ROM: full    Dental  (+) Teeth Intact   Pulmonary neg pulmonary ROS, asthma , former smoker,    Pulmonary exam normal        Cardiovascular hypertension, On Medications + Peripheral Vascular Disease  negative cardio ROS Normal cardiovascular exam Rhythm:regular Rate:Normal  Clearance per cheryl wicker.  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 No date: Menopause No date: Neuroma     Comment:  right foot No date: Osteoporosis No date: Raynaud's disease No date: Rhinitis due to pollen No date: SOB (shortness of breath) Past Surgical History: No date: FOOT NEUROMA SURGERY; Right No date: PARTIAL HYSTERECTOMY BMI    Body Mass Index:  27.10 kg/m     Reproductive/Obstetrics negative OB ROS                             Anesthesia Physical Anesthesia Plan  ASA: II  Anesthesia Plan: General ETT   Post-op Pain Management:  Regional for Post-op pain   Induction: Intravenous  PONV Risk Score and Plan:   Airway Management Planned:   Additional Equipment:   Intra-op Plan:   Post-operative Plan:    Informed Consent: I have reviewed the patients History and Physical, chart, labs and discussed the procedure including the risks, benefits and alternatives for the proposed anesthesia with the patient or authorized representative who has indicated his/her understanding and acceptance.   Dental Advisory Given  Plan Discussed with: CRNA  Anesthesia Plan Comments:         Anesthesia Quick Evaluation

## 2018-04-08 NOTE — Anesthesia Procedure Notes (Signed)
Procedure Name: Intubation Date/Time: 04/08/2018 10:19 AM Performed by: Geraldine Contras, CRNA Pre-anesthesia Checklist: Patient identified, Emergency Drugs available, Suction available, Patient being monitored and Timeout performed Patient Re-evaluated:Patient Re-evaluated prior to induction Oxygen Delivery Method: Simple face mask and Circle system utilized Preoxygenation: Pre-oxygenation with 100% oxygen Induction Type: IV induction Ventilation: Mask ventilation without difficulty Laryngoscope Size: Mac and 3 Grade View: Grade I Tube type: Oral Tube size: 7.0 mm Number of attempts: 1 Airway Equipment and Method: Stylet Placement Confirmation: ETT inserted through vocal cords under direct vision,  positive ETCO2 and breath sounds checked- equal and bilateral Secured at: 23 cm Tube secured with: Tape Dental Injury: Teeth and Oropharynx as per pre-operative assessment

## 2018-04-09 ENCOUNTER — Encounter: Payer: Self-pay | Admitting: Orthopedic Surgery

## 2018-04-17 DIAGNOSIS — R609 Edema, unspecified: Secondary | ICD-10-CM | POA: Diagnosis not present

## 2018-04-17 DIAGNOSIS — M6281 Muscle weakness (generalized): Secondary | ICD-10-CM | POA: Diagnosis not present

## 2018-04-17 DIAGNOSIS — M25511 Pain in right shoulder: Secondary | ICD-10-CM | POA: Diagnosis not present

## 2018-04-23 DIAGNOSIS — M25511 Pain in right shoulder: Secondary | ICD-10-CM | POA: Diagnosis not present

## 2018-04-23 DIAGNOSIS — M6281 Muscle weakness (generalized): Secondary | ICD-10-CM | POA: Diagnosis not present

## 2018-04-23 DIAGNOSIS — R609 Edema, unspecified: Secondary | ICD-10-CM | POA: Diagnosis not present

## 2018-04-26 DIAGNOSIS — R609 Edema, unspecified: Secondary | ICD-10-CM | POA: Diagnosis not present

## 2018-04-26 DIAGNOSIS — M25511 Pain in right shoulder: Secondary | ICD-10-CM | POA: Diagnosis not present

## 2018-04-26 DIAGNOSIS — M6281 Muscle weakness (generalized): Secondary | ICD-10-CM | POA: Diagnosis not present

## 2018-04-30 DIAGNOSIS — M6281 Muscle weakness (generalized): Secondary | ICD-10-CM | POA: Diagnosis not present

## 2018-04-30 DIAGNOSIS — M25511 Pain in right shoulder: Secondary | ICD-10-CM | POA: Diagnosis not present

## 2018-04-30 DIAGNOSIS — R609 Edema, unspecified: Secondary | ICD-10-CM | POA: Diagnosis not present

## 2018-05-02 DIAGNOSIS — M25511 Pain in right shoulder: Secondary | ICD-10-CM | POA: Diagnosis not present

## 2018-05-02 DIAGNOSIS — R600 Localized edema: Secondary | ICD-10-CM | POA: Diagnosis not present

## 2018-05-02 DIAGNOSIS — M6281 Muscle weakness (generalized): Secondary | ICD-10-CM | POA: Diagnosis not present

## 2018-05-06 DIAGNOSIS — M6281 Muscle weakness (generalized): Secondary | ICD-10-CM | POA: Diagnosis not present

## 2018-05-06 DIAGNOSIS — M25511 Pain in right shoulder: Secondary | ICD-10-CM | POA: Diagnosis not present

## 2018-05-06 DIAGNOSIS — R609 Edema, unspecified: Secondary | ICD-10-CM | POA: Diagnosis not present

## 2018-05-08 DIAGNOSIS — M6281 Muscle weakness (generalized): Secondary | ICD-10-CM | POA: Diagnosis not present

## 2018-05-08 DIAGNOSIS — M25511 Pain in right shoulder: Secondary | ICD-10-CM | POA: Diagnosis not present

## 2018-05-08 DIAGNOSIS — R600 Localized edema: Secondary | ICD-10-CM | POA: Diagnosis not present

## 2018-05-10 ENCOUNTER — Other Ambulatory Visit: Payer: Self-pay | Admitting: Unknown Physician Specialty

## 2018-05-15 ENCOUNTER — Telehealth: Payer: Self-pay | Admitting: Podiatry

## 2018-05-15 ENCOUNTER — Ambulatory Visit (INDEPENDENT_AMBULATORY_CARE_PROVIDER_SITE_OTHER): Payer: Medicare Other | Admitting: Podiatry

## 2018-05-15 ENCOUNTER — Ambulatory Visit (INDEPENDENT_AMBULATORY_CARE_PROVIDER_SITE_OTHER): Payer: BC Managed Care – PPO

## 2018-05-15 ENCOUNTER — Encounter: Payer: Self-pay | Admitting: Podiatry

## 2018-05-15 DIAGNOSIS — M778 Other enthesopathies, not elsewhere classified: Secondary | ICD-10-CM

## 2018-05-15 DIAGNOSIS — Z9889 Other specified postprocedural states: Secondary | ICD-10-CM

## 2018-05-15 DIAGNOSIS — M779 Enthesopathy, unspecified: Secondary | ICD-10-CM

## 2018-05-15 DIAGNOSIS — L905 Scar conditions and fibrosis of skin: Secondary | ICD-10-CM | POA: Diagnosis not present

## 2018-05-15 DIAGNOSIS — M25511 Pain in right shoulder: Secondary | ICD-10-CM | POA: Diagnosis not present

## 2018-05-15 DIAGNOSIS — M6281 Muscle weakness (generalized): Secondary | ICD-10-CM | POA: Diagnosis not present

## 2018-05-15 DIAGNOSIS — M7751 Other enthesopathy of right foot: Secondary | ICD-10-CM

## 2018-05-15 MED ORDER — CELECOXIB 100 MG PO CAPS: 100.0000 mg | ORAL_CAPSULE | Freq: Every day | ORAL | 3 refills | Status: DC

## 2018-05-15 NOTE — Addendum Note (Signed)
Addended by: Harriett Sine D on: 05/15/2018 11:35 AM   Modules accepted: Orders

## 2018-05-15 NOTE — Telephone Encounter (Signed)
Please send Physical Therapy RX over to ([facility]) on [address]. Pt is currently a patient there.  The office just needs you to send RX from today office visit.

## 2018-05-15 NOTE — Telephone Encounter (Signed)
Pt can be seen by [facility] and pt wanted to leave the fax number so that the prescription for Physical Therapy can be sent. Fax Number---[phone].

## 2018-05-15 NOTE — Telephone Encounter (Signed)
Faxed referral to [facility].

## 2018-05-15 NOTE — Progress Notes (Signed)
She presents today for follow-up of her right foot.  She states that where the neuroma was still tingly and I get pain in there occasionally.  She states that it feels tight and the toes feel tight especially left fourth toe feels like he cannot bend it.  Objective: Vital signs are stable she is alert and oriented x3.  Pulses are palpable.  Much of the scar has loosened up proximally to the third interdigital space of the right foot.  However the distalmost aspect of the scars were appears that she is painful.  The fourth toe is also painful to some degree even though the branches of the nerve have been removed.  But she does have osteoarthritic changes at this area which is confirmed on radiograph.  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  Assessment: I feel that her pain is associated with scar tissue within the third interdigital space.  Plan: I will refer her to physical therapy where she is already taking physical therapy for her shoulder which is [facility] in [place] and I will follow-up with her on as-needed basis.

## 2018-05-17 DIAGNOSIS — R609 Edema, unspecified: Secondary | ICD-10-CM | POA: Diagnosis not present

## 2018-05-17 DIAGNOSIS — M6281 Muscle weakness (generalized): Secondary | ICD-10-CM | POA: Diagnosis not present

## 2018-05-17 DIAGNOSIS — M25511 Pain in right shoulder: Secondary | ICD-10-CM | POA: Diagnosis not present

## 2018-05-20 DIAGNOSIS — M25511 Pain in right shoulder: Secondary | ICD-10-CM | POA: Diagnosis not present

## 2018-05-20 DIAGNOSIS — R609 Edema, unspecified: Secondary | ICD-10-CM | POA: Diagnosis not present

## 2018-05-20 DIAGNOSIS — M6281 Muscle weakness (generalized): Secondary | ICD-10-CM | POA: Diagnosis not present

## 2018-05-22 DIAGNOSIS — M79671 Pain in right foot: Secondary | ICD-10-CM | POA: Diagnosis not present

## 2018-05-22 DIAGNOSIS — R609 Edema, unspecified: Secondary | ICD-10-CM | POA: Diagnosis not present

## 2018-05-22 DIAGNOSIS — R2241 Localized swelling, mass and lump, right lower limb: Secondary | ICD-10-CM | POA: Diagnosis not present

## 2018-05-24 DIAGNOSIS — J301 Allergic rhinitis due to pollen: Secondary | ICD-10-CM | POA: Diagnosis not present

## 2018-05-27 DIAGNOSIS — M25511 Pain in right shoulder: Secondary | ICD-10-CM | POA: Diagnosis not present

## 2018-05-27 DIAGNOSIS — R609 Edema, unspecified: Secondary | ICD-10-CM | POA: Diagnosis not present

## 2018-05-27 DIAGNOSIS — M6281 Muscle weakness (generalized): Secondary | ICD-10-CM | POA: Diagnosis not present

## 2018-05-29 DIAGNOSIS — R609 Edema, unspecified: Secondary | ICD-10-CM | POA: Diagnosis not present

## 2018-05-29 DIAGNOSIS — M6281 Muscle weakness (generalized): Secondary | ICD-10-CM | POA: Diagnosis not present

## 2018-05-29 DIAGNOSIS — M25511 Pain in right shoulder: Secondary | ICD-10-CM | POA: Diagnosis not present

## 2018-06-03 DIAGNOSIS — M6281 Muscle weakness (generalized): Secondary | ICD-10-CM | POA: Diagnosis not present

## 2018-06-03 DIAGNOSIS — R600 Localized edema: Secondary | ICD-10-CM | POA: Diagnosis not present

## 2018-06-03 DIAGNOSIS — M25511 Pain in right shoulder: Secondary | ICD-10-CM | POA: Diagnosis not present

## 2018-06-05 DIAGNOSIS — R600 Localized edema: Secondary | ICD-10-CM | POA: Diagnosis not present

## 2018-06-05 DIAGNOSIS — M6281 Muscle weakness (generalized): Secondary | ICD-10-CM | POA: Diagnosis not present

## 2018-06-05 DIAGNOSIS — M25511 Pain in right shoulder: Secondary | ICD-10-CM | POA: Diagnosis not present

## 2018-06-10 DIAGNOSIS — R600 Localized edema: Secondary | ICD-10-CM | POA: Diagnosis not present

## 2018-06-10 DIAGNOSIS — M6281 Muscle weakness (generalized): Secondary | ICD-10-CM | POA: Diagnosis not present

## 2018-06-10 DIAGNOSIS — M25511 Pain in right shoulder: Secondary | ICD-10-CM | POA: Diagnosis not present

## 2018-06-12 DIAGNOSIS — M25511 Pain in right shoulder: Secondary | ICD-10-CM | POA: Diagnosis not present

## 2018-06-12 DIAGNOSIS — M6281 Muscle weakness (generalized): Secondary | ICD-10-CM | POA: Diagnosis not present

## 2018-06-12 DIAGNOSIS — R600 Localized edema: Secondary | ICD-10-CM | POA: Diagnosis not present

## 2018-06-17 DIAGNOSIS — M25511 Pain in right shoulder: Secondary | ICD-10-CM | POA: Diagnosis not present

## 2018-06-17 DIAGNOSIS — M6281 Muscle weakness (generalized): Secondary | ICD-10-CM | POA: Diagnosis not present

## 2018-06-17 DIAGNOSIS — R600 Localized edema: Secondary | ICD-10-CM | POA: Diagnosis not present

## 2018-06-19 DIAGNOSIS — M6281 Muscle weakness (generalized): Secondary | ICD-10-CM | POA: Diagnosis not present

## 2018-06-19 DIAGNOSIS — M25511 Pain in right shoulder: Secondary | ICD-10-CM | POA: Diagnosis not present

## 2018-06-19 DIAGNOSIS — R600 Localized edema: Secondary | ICD-10-CM | POA: Diagnosis not present

## 2018-06-24 DIAGNOSIS — M6281 Muscle weakness (generalized): Secondary | ICD-10-CM | POA: Diagnosis not present

## 2018-06-24 DIAGNOSIS — R600 Localized edema: Secondary | ICD-10-CM | POA: Diagnosis not present

## 2018-06-24 DIAGNOSIS — M25511 Pain in right shoulder: Secondary | ICD-10-CM | POA: Diagnosis not present

## 2018-06-28 DIAGNOSIS — M6281 Muscle weakness (generalized): Secondary | ICD-10-CM | POA: Diagnosis not present

## 2018-06-28 DIAGNOSIS — R600 Localized edema: Secondary | ICD-10-CM | POA: Diagnosis not present

## 2018-06-28 DIAGNOSIS — M25511 Pain in right shoulder: Secondary | ICD-10-CM | POA: Diagnosis not present

## 2018-07-01 DIAGNOSIS — R600 Localized edema: Secondary | ICD-10-CM | POA: Diagnosis not present

## 2018-07-01 DIAGNOSIS — M6281 Muscle weakness (generalized): Secondary | ICD-10-CM | POA: Diagnosis not present

## 2018-07-01 DIAGNOSIS — M25511 Pain in right shoulder: Secondary | ICD-10-CM | POA: Diagnosis not present

## 2018-07-03 ENCOUNTER — Encounter: Payer: Self-pay | Admitting: Podiatry

## 2018-07-03 ENCOUNTER — Ambulatory Visit (INDEPENDENT_AMBULATORY_CARE_PROVIDER_SITE_OTHER): Payer: Self-pay | Admitting: Podiatry

## 2018-07-03 DIAGNOSIS — M25511 Pain in right shoulder: Secondary | ICD-10-CM | POA: Diagnosis not present

## 2018-07-03 DIAGNOSIS — M6281 Muscle weakness (generalized): Secondary | ICD-10-CM | POA: Diagnosis not present

## 2018-07-03 DIAGNOSIS — T8189XA Other complications of procedures, not elsewhere classified, initial encounter: Secondary | ICD-10-CM

## 2018-07-03 DIAGNOSIS — Z189 Retained foreign body fragments, unspecified material: Secondary | ICD-10-CM

## 2018-07-03 NOTE — Progress Notes (Signed)
She presents today for a painful scab on top of the incision looks like there is a piece of stitch or something like that left and it as she refers to her neurectomy on her right foot.  Objective: Vital signs are stable alert and oriented x3.  Pulses are palpable.  She has a small area that is raised and there appears to be a piece of Prolene stitch that is left in the wound.  Assessment: Retention foreign body stitch status post neurectomy.  Plan: Debrided the scab noticing that there was Prolene present at this point I provided a small bit of local anesthetic through a 30-gauge needle at the point of tenderness.  I then remove the small piece of Prolene.  Patient tolerated this procedure well stated felt 100% improved.  There is no bleeding no purulence no malodor.  No abscess noted.  Follow-up with me as needed

## 2018-07-08 DIAGNOSIS — M25511 Pain in right shoulder: Secondary | ICD-10-CM | POA: Diagnosis not present

## 2018-07-08 DIAGNOSIS — R609 Edema, unspecified: Secondary | ICD-10-CM | POA: Diagnosis not present

## 2018-07-08 DIAGNOSIS — M6281 Muscle weakness (generalized): Secondary | ICD-10-CM | POA: Diagnosis not present

## 2018-07-10 DIAGNOSIS — M25511 Pain in right shoulder: Secondary | ICD-10-CM | POA: Diagnosis not present

## 2018-07-10 DIAGNOSIS — M6281 Muscle weakness (generalized): Secondary | ICD-10-CM | POA: Diagnosis not present

## 2018-07-10 DIAGNOSIS — R600 Localized edema: Secondary | ICD-10-CM | POA: Diagnosis not present

## 2018-07-15 DIAGNOSIS — M25511 Pain in right shoulder: Secondary | ICD-10-CM | POA: Diagnosis not present

## 2018-07-15 DIAGNOSIS — M6281 Muscle weakness (generalized): Secondary | ICD-10-CM | POA: Diagnosis not present

## 2018-07-15 DIAGNOSIS — R600 Localized edema: Secondary | ICD-10-CM | POA: Diagnosis not present

## 2018-07-16 DIAGNOSIS — M25511 Pain in right shoulder: Secondary | ICD-10-CM | POA: Diagnosis not present

## 2018-07-17 DIAGNOSIS — M6281 Muscle weakness (generalized): Secondary | ICD-10-CM | POA: Diagnosis not present

## 2018-07-17 DIAGNOSIS — R600 Localized edema: Secondary | ICD-10-CM | POA: Diagnosis not present

## 2018-07-17 DIAGNOSIS — M25511 Pain in right shoulder: Secondary | ICD-10-CM | POA: Diagnosis not present

## 2018-08-02 DIAGNOSIS — R42 Dizziness and giddiness: Secondary | ICD-10-CM | POA: Diagnosis not present

## 2018-08-16 ENCOUNTER — Other Ambulatory Visit: Payer: Self-pay | Admitting: Unknown Physician Specialty

## 2018-08-16 NOTE — Telephone Encounter (Signed)
Will fill for a month. Please schedule follow up in one month for chronic issues.

## 2018-08-16 NOTE — Telephone Encounter (Signed)
Hydrodiuril refill Last Refill:10/01/17 # 45 with 0 refill Last OV: 04/02/18 PCP: [facility] Pharmacy:[facility] Does pt. Need to schedule an appointment?

## 2018-08-16 NOTE — Telephone Encounter (Signed)
Please call and schedule f/up appointment per Fabio Bering.

## 2018-08-21 ENCOUNTER — Other Ambulatory Visit: Payer: Self-pay

## 2018-08-21 MED ORDER — HYDROCHLOROTHIAZIDE 25 MG PO TABS: 12.5000 mg | ORAL_TABLET | Freq: Every day | ORAL | 0 refills | Status: DC

## 2018-08-21 NOTE — Telephone Encounter (Signed)
Patient last seen 04/02/18

## 2018-08-27 DIAGNOSIS — Z9889 Other specified postprocedural states: Secondary | ICD-10-CM | POA: Diagnosis not present

## 2018-08-27 DIAGNOSIS — M19012 Primary osteoarthritis, left shoulder: Secondary | ICD-10-CM | POA: Diagnosis not present

## 2018-08-27 DIAGNOSIS — M19011 Primary osteoarthritis, right shoulder: Secondary | ICD-10-CM | POA: Diagnosis not present

## 2018-10-07 DIAGNOSIS — M7521 Bicipital tendinitis, right shoulder: Secondary | ICD-10-CM | POA: Diagnosis not present

## 2018-10-07 DIAGNOSIS — M7541 Impingement syndrome of right shoulder: Secondary | ICD-10-CM | POA: Diagnosis not present

## 2018-10-07 DIAGNOSIS — M19011 Primary osteoarthritis, right shoulder: Secondary | ICD-10-CM | POA: Diagnosis not present

## 2018-10-16 ENCOUNTER — Encounter: Payer: Self-pay | Admitting: Nurse Practitioner

## 2018-10-16 ENCOUNTER — Ambulatory Visit: Payer: Medicare Other | Admitting: Nurse Practitioner

## 2018-10-16 VITALS — BP 128/78 | HR 69 | Temp 98.2°F | Ht 63.0 in | Wt 153.5 lb

## 2018-10-16 DIAGNOSIS — E559 Vitamin D deficiency, unspecified: Secondary | ICD-10-CM

## 2018-10-16 DIAGNOSIS — I10 Essential (primary) hypertension: Secondary | ICD-10-CM

## 2018-10-16 DIAGNOSIS — E782 Mixed hyperlipidemia: Secondary | ICD-10-CM | POA: Diagnosis not present

## 2018-10-16 DIAGNOSIS — Z23 Encounter for immunization: Secondary | ICD-10-CM

## 2018-10-16 MED ORDER — ESTRADIOL 2 MG PO TABS: 2.0000 mg | ORAL_TABLET | Freq: Every day | ORAL | 3 refills | Status: DC

## 2018-10-16 MED ORDER — HYDROCHLOROTHIAZIDE 25 MG PO TABS: 12.5000 mg | ORAL_TABLET | Freq: Every day | ORAL | 5 refills | Status: DC

## 2018-10-16 MED ORDER — FLUTICASONE PROPIONATE 50 MCG/ACT NA SUSP: 2.0000 | Freq: Every day | NASAL | 3 refills | Status: DC

## 2018-10-16 MED ORDER — ATORVASTATIN CALCIUM 20 MG PO TABS: 20.0000 mg | ORAL_TABLET | Freq: Every day | ORAL | 5 refills | Status: DC

## 2018-10-16 NOTE — Assessment & Plan Note (Addendum)
Stable.  Continue current medication regimen.  BP below goal today.  CMP today.

## 2018-10-16 NOTE — Assessment & Plan Note (Addendum)
Stable.  Continue current medication regimen.  Lipid panel today.

## 2018-10-16 NOTE — Progress Notes (Signed)
BP 128/78 (BP Location: Left Arm, Patient Position: Sitting)   Pulse 69   Temp 98.2 F (36.8 C) (Oral)   Ht _0  (1.6 m)   Wt 153 lb 8 oz (69.6 kg)   LMP 07/31/1981 (Approximate)   SpO2 99%   BMI 27.19 kg/m    Subjective:    Patient ID: Nancy Mack, female    DOB: 04-13-1946, 72 y.o.   MRN: [id number]  HPI: Nancy Mack is a 72 y.o. female  Chief Complaint  Patient presents with  . Hypertension  . Hyperlipidemia  . Medication Refill    Hydrochlorothizide   HYPERTENSION / HYPERLIPIDEMIA: Satisfied with current treatment? yes Duration of hypertension: chronic BP monitoring frequency: daily BP range: 127/70 most often per patient report BP medication side effects: no Past BP meds: none Duration of hyperlipidemia: chronic Cholesterol medication side effects: no Cholesterol supplements: none Past cholesterol medications: none Medication compliance: excellent compliance Aspirin: no Recent stressors: no Recurrent headaches: no Visual changes: no Palpitations: no Dyspnea: no Chest pain: no Lower extremity edema: no Dizzy/lightheaded: no  VITAMIN D DEFICIENCY: On daily Vitamin D.  No recent falls.  No recent Vit D level in chart.    Relevant past medical, surgical, family and social history reviewed and updated as indicated. Interim medical history since our last visit reviewed. Allergies and medications reviewed and updated.  Review of Systems  Constitutional: Negative for activity change, appetite change and fatigue.  Respiratory: Negative for cough, chest tightness and shortness of breath.   Cardiovascular: Negative for chest pain, palpitations and leg swelling.  Gastrointestinal: Negative for abdominal distention, abdominal pain, constipation, diarrhea, nausea and vomiting.  Endocrine: Negative for cold intolerance, heat intolerance, polydipsia, polyphagia and polyuria.  Genitourinary: Negative.   Musculoskeletal: Negative.   Skin: Negative.     Neurological: Negative for dizziness, tremors, syncope, weakness, light-headedness, numbness and headaches.  Psychiatric/Behavioral: Negative.     Per HPI unless specifically indicated above     Objective:    BP 128/78 (BP Location: Left Arm, Patient Position: Sitting)   Pulse 69   Temp 98.2 F (36.8 C) (Oral)   Ht _1  (1.6 m)   Wt 153 lb 8 oz (69.6 kg)   LMP 07/31/1981 (Approximate)   SpO2 99%   BMI 27.19 kg/m   Wt Readings from Last 3 Encounters:  10/16/18 153 lb 8 oz (69.6 kg)  04/08/18 153 lb (69.4 kg)  04/02/18 153 lb 4.8 oz (69.5 kg)    Physical Exam  Constitutional: She is oriented to person, place, and time. She appears well-developed and well-nourished.  HENT:  Head: Normocephalic and atraumatic.  Eyes: Pupils are equal, round, and reactive to light. Conjunctivae and EOM are normal. Right eye exhibits no discharge. Left eye exhibits no discharge.  Neck: Normal range of motion. Neck supple. No JVD present. Carotid bruit is not present. No thyromegaly present.  Cardiovascular: Normal rate, regular rhythm and normal heart sounds.  Pulmonary/Chest: Effort normal and breath sounds normal.  Abdominal: Soft. Bowel sounds are normal.  Musculoskeletal: Normal range of motion.  Neurological: She is alert and oriented to person, place, and time.  Skin: Skin is warm and dry.  Psychiatric: She has a normal mood and affect. Her behavior is normal. Judgment and thought content normal.   Results for orders placed or performed during the hospital encounter of 04/01/18  CBC  Result Value Ref Range   WBC 6.1 3.6 - 11.0 K/uL   RBC 4.54 3.80 - 5.20  MIL/uL   Hemoglobin 12.3 12.0 - 16.0 g/dL   HCT 37.1 35.0 - 47.0 %   MCV 81.7 80.0 - 100.0 fL   MCH 27.1 26.0 - 34.0 pg   MCHC 33.1 32.0 - 36.0 g/dL   RDW 14.7 (H) 11.5 - 14.5 %   Platelets 349 150 - 440 K/uL      Assessment & Plan:   Problem List Items Addressed This Visit      Cardiovascular and Mediastinum   Benign  essential HTN    Stable.  Continue current medication regimen.  BP below goal today.  CMP today.      Relevant Medications   hydrochlorothiazide (HYDRODIURIL) 25 MG tablet   atorvastatin (LIPITOR) 20 MG tablet   Other Relevant Orders   Comp Met (CMET)   CBC w/Diff     Other   Hyperlipidemia    Stable.  Continue current medication regimen.  Lipid panel today.        Relevant Medications   hydrochlorothiazide (HYDRODIURIL) 25 MG tablet   atorvastatin (LIPITOR) 20 MG tablet   Other Relevant Orders   Lipid Profile   Vitamin D deficiency    Chronic, stable.  Vit D level today.      Relevant Orders   Vitamin D (25 hydroxy)    Other Visit Diagnoses    Flu vaccine need    -  Primary       Follow up plan: Return in about 6 months (around 04/17/2019) for HTN and HLD.

## 2018-10-16 NOTE — Assessment & Plan Note (Addendum)
Chronic, stable.  Vit D level today.

## 2018-10-16 NOTE — Patient Instructions (Signed)
Vitamin D Deficiency Vitamin D deficiency is when your body does not have enough vitamin D. Vitamin D is important because:  It helps your body use other minerals that your body needs.  It helps keep your bones strong and healthy.  It may help to prevent some diseases.  It helps your heart and other muscles work well.  You can get vitamin D by:  Eating foods with vitamin D in them.  Drinking or eating milk or other foods that have had vitamin D added to them.  Taking a vitamin D supplement.  Being in the sun.  Not getting enough vitamin D can make your bones become soft. It can also cause other health problems. Follow these instructions at home:  Take medicines and supplements only as told by your doctor.  Eat foods that have vitamin D. These include: ? Dairy products, cereals, or juices with added vitamin D. Check the label for vitamin D. ? Fatty fish like salmon or trout. ? Eggs. ? Oysters.  Do not use tanning beds.  Stay at a healthy weight. Lose weight, if needed.  Keep all follow-up visits as told by your doctor. This is important. Contact a doctor if:  Your symptoms do not go away.  You feel sick to your stomach (nauseous).  Youthrow up (vomit).  You poop less often than usual or you have trouble pooping (constipation). This information is not intended to replace advice given to you by your health care provider. Make sure you discuss any questions you have with your health care provider. Document Released: 11/23/2011 Document Revised: 05/11/2016 Document Reviewed: 04/21/2015 Elsevier Interactive Patient Education  2018 Elsevier Inc.  

## 2018-10-17 ENCOUNTER — Telehealth: Payer: Self-pay | Admitting: Nurse Practitioner

## 2018-10-17 LAB — CBC WITH DIFFERENTIAL/PLATELET
Basophils Absolute: 0.1 10*3/uL (ref 0.0–0.2)
Basos: 1 %
EOS (ABSOLUTE): 0.3 10*3/uL (ref 0.0–0.4)
Eos: 6 %
Hematocrit: 37.5 % (ref 34.0–46.6)
Hemoglobin: 12.4 g/dL (ref 11.1–15.9)
Immature Grans (Abs): 0 10*3/uL (ref 0.0–0.1)
Immature Granulocytes: 0 %
LYMPHS ABS: 1.4 10*3/uL (ref 0.7–3.1)
Lymphs: 27 %
MCH: 27.1 pg (ref 26.6–33.0)
MCHC: 33.1 g/dL (ref 31.5–35.7)
MCV: 82 fL (ref 79–97)
MONOS ABS: 0.5 10*3/uL (ref 0.1–0.9)
Monocytes: 9 %
NEUTROS ABS: 3 10*3/uL (ref 1.4–7.0)
NEUTROS PCT: 57 %
PLATELETS: 366 10*3/uL (ref 150–450)
RBC: 4.58 x10E6/uL (ref 3.77–5.28)
RDW: 13.8 % (ref 12.3–15.4)
WBC: 5.2 10*3/uL (ref 3.4–10.8)

## 2018-10-17 LAB — COMPREHENSIVE METABOLIC PANEL
A/G RATIO: 1.8 (ref 1.2–2.2)
ALBUMIN: 4.2 g/dL (ref 3.5–4.8)
ALK PHOS: 72 IU/L (ref 39–117)
ALT: 13 IU/L (ref 0–32)
AST: 21 IU/L (ref 0–40)
BUN / CREAT RATIO: 13 (ref 12–28)
BUN: 13 mg/dL (ref 8–27)
Bilirubin Total: 0.3 mg/dL (ref 0.0–1.2)
CO2: 25 mmol/L (ref 20–29)
CREATININE: 0.97 mg/dL (ref 0.57–1.00)
Calcium: 9.7 mg/dL (ref 8.7–10.3)
Chloride: 103 mmol/L (ref 96–106)
GFR calc Af Amer: 68 mL/min/{1.73_m2} (ref >59)
GFR calc non Af Amer: 59 mL/min/{1.73_m2} — ABNORMAL LOW (ref >59)
GLOBULIN, TOTAL: 2.4 g/dL (ref 1.5–4.5)
Glucose: 95 mg/dL (ref 65–99)
POTASSIUM: 4.4 mmol/L (ref 3.5–5.2)
SODIUM: 144 mmol/L (ref 134–144)
Total Protein: 6.6 g/dL (ref 6.0–8.5)

## 2018-10-17 LAB — LIPID PANEL
CHOLESTEROL TOTAL: 258 mg/dL — AB (ref 100–199)
Chol/HDL Ratio: 4 ratio (ref 0.0–4.4)
HDL: 64 mg/dL (ref >39)
LDL Calculated: 171 mg/dL — ABNORMAL HIGH (ref 0–99)
TRIGLYCERIDES: 113 mg/dL (ref 0–149)
VLDL CHOLESTEROL CAL: 23 mg/dL (ref 5–40)

## 2018-10-17 LAB — VITAMIN D 25 HYDROXY (VIT D DEFICIENCY, FRACTURES): Vit D, 25-Hydroxy: 52.3 ng/mL (ref 30.0–100.0)

## 2018-10-17 NOTE — Telephone Encounter (Signed)
General message left with patient to call provider for lab review.  LDL and TCHOL elevated from previous labs.  Would like to repeat via fasting lab draw, which she can come in to do over next week or two.  If continue to be elevated would recommend increasing Atorvastatin and focusing on diet due to current ASCVD score.  The 10-year ASCVD risk score Mikey Bussing DC Brooke Bonito., et al., 2013) is: 16.1%   Values used to calculate the score:     Age: 72 years     Sex: Female     Is Non-Hispanic African American: Yes     Diabetic: No     Tobacco smoker: No     Systolic Blood Pressure: 230 mmHg     Is BP treated: Yes     HDL Cholesterol: 64 mg/dL     Total Cholesterol: 258 mg/dL

## 2018-10-23 ENCOUNTER — Ambulatory Visit: Payer: Medicare Other

## 2018-10-23 ENCOUNTER — Ambulatory Visit (INDEPENDENT_AMBULATORY_CARE_PROVIDER_SITE_OTHER): Payer: Medicare Other

## 2018-10-23 DIAGNOSIS — Z23 Encounter for immunization: Secondary | ICD-10-CM

## 2019-02-20 ENCOUNTER — Ambulatory Visit (INDEPENDENT_AMBULATORY_CARE_PROVIDER_SITE_OTHER): Payer: Medicare Other | Admitting: Nurse Practitioner

## 2019-02-20 ENCOUNTER — Encounter: Payer: Self-pay | Admitting: Nurse Practitioner

## 2019-02-20 ENCOUNTER — Ambulatory Visit (INDEPENDENT_AMBULATORY_CARE_PROVIDER_SITE_OTHER): Payer: Medicare Other

## 2019-02-20 ENCOUNTER — Other Ambulatory Visit: Payer: Self-pay

## 2019-02-20 VITALS — BP 118/70 | HR 89 | Temp 98.5°F | Wt 149.0 lb

## 2019-02-20 VITALS — BP 118/70 | HR 89 | Temp 98.5°F | Resp 16 | Ht 63.0 in | Wt 149.0 lb

## 2019-02-20 DIAGNOSIS — J40 Bronchitis, not specified as acute or chronic: Secondary | ICD-10-CM

## 2019-02-20 DIAGNOSIS — Z1211 Encounter for screening for malignant neoplasm of colon: Secondary | ICD-10-CM | POA: Diagnosis not present

## 2019-02-20 DIAGNOSIS — Z0000 Encounter for general adult medical examination without abnormal findings: Secondary | ICD-10-CM | POA: Diagnosis not present

## 2019-02-20 MED ORDER — HYDROCOD POLST-CPM POLST ER 10-8 MG/5ML PO SUER: 5.0000 mL | Freq: Two times a day (BID) | ORAL | 0 refills | Status: DC | PRN

## 2019-02-20 MED ORDER — AZITHROMYCIN 250 MG PO TABS: ORAL_TABLET | ORAL | 0 refills | Status: DC

## 2019-02-20 NOTE — Patient Instructions (Signed)
Acute Bronchitis, Adult Acute bronchitis is when air tubes (bronchi) in the lungs suddenly get swollen. The condition can make it hard to breathe. It can also cause these symptoms:  A cough.  Coughing up clear, yellow, or green mucus.  Wheezing.  Chest congestion.  Shortness of breath.  A fever.  Body aches.  Chills.  A sore throat. Follow these instructions at home:  Medicines  Take over-the-counter and prescription medicines only as told by your doctor.  If you were prescribed an antibiotic medicine, take it as told by your doctor. Do not stop taking the antibiotic even if you start to feel better. General instructions  Rest.  Drink enough fluids to keep your pee (urine) pale yellow.  Avoid smoking and secondhand smoke. If you smoke and you need help quitting, ask your doctor. Quitting will help your lungs heal faster.  Use an inhaler, cool mist vaporizer, or humidifier as told by your doctor.  Keep all follow-up visits as told by your doctor. This is important. How is this prevented? To lower your risk of getting this condition again:  Wash your hands often with soap and water. If you cannot use soap and water, use hand sanitizer.  Avoid contact with people who have cold symptoms.  Try not to touch your hands to your mouth, nose, or eyes.  Make sure to get the flu shot every year. Contact a doctor if:  Your symptoms do not get better in 2 weeks. Get help right away if:  You cough up blood.  You have chest pain.  You have very bad shortness of breath.  You become dehydrated.  You faint (pass out) or keep feeling like you are going to pass out.  You keep throwing up (vomiting).  You have a very bad headache.  Your fever or chills gets worse. This information is not intended to replace advice given to you by your health care provider. Make sure you discuss any questions you have with your health care provider. Document Released: 05/22/2008 Document  Revised: 07/18/2017 Document Reviewed: 05/24/2016 Elsevier Interactive Patient Education  2019 Elsevier Inc.  

## 2019-02-20 NOTE — Patient Instructions (Signed)
Nancy Mack , Thank you for taking time to come for your Medicare Wellness Visit. I appreciate your ongoing commitment to your health goals. Please review the following plan we discussed and let me know if I can assist you in the future.   Screening recommendations/referrals: Colonoscopy: completed 01/01/2008, someone will call to schedule  Mammogram: completed 03/27/2018 Bone Density: completed 05/18/2016 Recommended yearly ophthalmology/optometry visit for glaucoma screening and checkup Recommended yearly dental visit for hygiene and checkup  Vaccinations: Influenza vaccine: up to date  Pneumococcal vaccine: up to date  Tdap vaccine: up to date  Shingles vaccine: shingrix eligible, check with your insurance company for coverage   Advanced directives: Advance directive discussed with you today. I have provided a copy for you to complete at home and have notarized. Once this is complete please bring a copy in to our office so we can scan it into your chart.  Conditions/risks identified: none   Next appointment: Follow up in one year for your annual wellness exam.    Preventive Care 65 Years and Older, Female Preventive care refers to lifestyle choices and visits with your health care provider that can promote health and wellness. What does preventive care include?  A yearly physical exam. This is also called an annual well check.  Dental exams once or twice a year.  Routine eye exams. Ask your health care provider how often you should have your eyes checked.  Personal lifestyle choices, including:  Daily care of your teeth and gums.  Regular physical activity.  Eating a healthy diet.  Avoiding tobacco and drug use.  Limiting alcohol use.  Practicing safe sex.  Taking low-dose aspirin every day.  Taking vitamin and mineral supplements as recommended by your health care provider. What happens during an annual well check? The services and screenings done by your health  care provider during your annual well check will depend on your age, overall health, lifestyle risk factors, and family history of disease. Counseling  Your health care provider may ask you questions about your:  Alcohol use.  Tobacco use.  Drug use.  Emotional well-being.  Home and relationship well-being.  Sexual activity.  Eating habits.  History of falls.  Memory and ability to understand (cognition).  Work and work Statistician.  Reproductive health. Screening  You may have the following tests or measurements:  Height, weight, and BMI.  Blood pressure.  Lipid and cholesterol levels. These may be checked every 5 years, or more frequently if you are over 40 years old.  Skin check.  Lung cancer screening. You may have this screening every year starting at age 32 if you have a 30-pack-year history of smoking and currently smoke or have quit within the past 15 years.  Fecal occult blood test (FOBT) of the stool. You may have this test every year starting at age 102.  Flexible sigmoidoscopy or colonoscopy. You may have a sigmoidoscopy every 5 years or a colonoscopy every 10 years starting at age 26.  Hepatitis C blood test.  Hepatitis B blood test.  Sexually transmitted disease (STD) testing.  Diabetes screening. This is done by checking your blood sugar (glucose) after you have not eaten for a while (fasting). You may have this done every 1-3 years.  Bone density scan. This is done to screen for osteoporosis. You may have this done starting at age 40.  Mammogram. This may be done every 1-2 years. Talk to your health care provider about how often you should have regular mammograms.  Talk with your health care provider about your test results, treatment options, and if necessary, the need for more tests. Vaccines  Your health care provider may recommend certain vaccines, such as:  Influenza vaccine. This is recommended every year.  Tetanus, diphtheria, and  acellular pertussis (Tdap, Td) vaccine. You may need a Td booster every 10 years.  Zoster vaccine. You may need this after age 73.  Pneumococcal 13-valent conjugate (PCV13) vaccine. One dose is recommended after age 100.  Pneumococcal polysaccharide (PPSV23) vaccine. One dose is recommended after age 66. Talk to your health care provider about which screenings and vaccines you need and how often you need them. This information is not intended to replace advice given to you by your health care provider. Make sure you discuss any questions you have with your health care provider. Document Released: 12/31/2015 Document Revised: 08/23/2016 Document Reviewed: 10/05/2015 Elsevier Interactive Patient Education  2017 [place] Prevention in the Home Falls can cause injuries. They can happen to people of all ages. There are many things you can do to make your home safe and to help prevent falls. What can I do on the outside of my home?  Regularly fix the edges of walkways and driveways and fix any cracks.  Remove anything that might make you trip as you walk through a door, such as a raised step or threshold.  Trim any bushes or trees on the path to your home.  Use bright outdoor lighting.  Clear any walking paths of anything that might make someone trip, such as rocks or tools.  Regularly check to see if handrails are loose or broken. Make sure that both sides of any steps have handrails.  Any raised decks and porches should have guardrails on the edges.  Have any leaves, snow, or ice cleared regularly.  Use sand or salt on walking paths during winter.  Clean up any spills in your garage right away. This includes oil or grease spills. What can I do in the bathroom?  Use night lights.  Install grab bars by the toilet and in the tub and shower. Do not use towel bars as grab bars.  Use non-skid mats or decals in the tub or shower.  If you need to sit down in the shower, use  a plastic, non-slip stool.  Keep the floor dry. Clean up any water that spills on the floor as soon as it happens.  Remove soap buildup in the tub or shower regularly.  Attach bath mats securely with double-sided non-slip rug tape.  Do not have throw rugs and other things on the floor that can make you trip. What can I do in the bedroom?  Use night lights.  Make sure that you have a light by your bed that is easy to reach.  Do not use any sheets or blankets that are too big for your bed. They should not hang down onto the floor.  Have a firm chair that has side arms. You can use this for support while you get dressed.  Do not have throw rugs and other things on the floor that can make you trip. What can I do in the kitchen?  Clean up any spills right away.  Avoid walking on wet floors.  Keep items that you use a lot in easy-to-reach places.  If you need to reach something above you, use a strong step stool that has a grab bar.  Keep electrical cords out of the way.  Do not use floor polish or wax that makes floors slippery. If you must use wax, use non-skid floor wax.  Do not have throw rugs and other things on the floor that can make you trip. What can I do with my stairs?  Do not leave any items on the stairs.  Make sure that there are handrails on both sides of the stairs and use them. Fix handrails that are broken or loose. Make sure that handrails are as long as the stairways.  Check any carpeting to make sure that it is firmly attached to the stairs. Fix any carpet that is loose or worn.  Avoid having throw rugs at the top or bottom of the stairs. If you do have throw rugs, attach them to the floor with carpet tape.  Make sure that you have a light switch at the top of the stairs and the bottom of the stairs. If you do not have them, ask someone to add them for you. What else can I do to help prevent falls?  Wear shoes that:  Do not have high heels.  Have  rubber bottoms.  Are comfortable and fit you well.  Are closed at the toe. Do not wear sandals.  If you use a stepladder:  Make sure that it is fully opened. Do not climb a closed stepladder.  Make sure that both sides of the stepladder are locked into place.  Ask someone to hold it for you, if possible.  Clearly mark and make sure that you can see:  Any grab bars or handrails.  First and last steps.  Where the edge of each step is.  Use tools that help you move around (mobility aids) if they are needed. These include:  Canes.  Walkers.  Scooters.  Crutches.  Turn on the lights when you go into a dark area. Replace any light bulbs as soon as they burn out.  Set up your furniture so you have a clear path. Avoid moving your furniture around.  If any of your floors are uneven, fix them.  If there are any pets around you, be aware of where they are.  Review your medicines with your doctor. Some medicines can make you feel dizzy. This can increase your chance of falling. Ask your doctor what other things that you can do to help prevent falls. This information is not intended to replace advice given to you by your health care provider. Make sure you discuss any questions you have with your health care provider. Document Released: 09/30/2009 Document Revised: 05/11/2016 Document Reviewed: 01/08/2015 Elsevier Interactive Patient Education  2017 [employer].

## 2019-02-20 NOTE — Addendum Note (Signed)
Addended by: Tyler Aas A on: 02/20/2019 03:05 PM   Modules accepted: Miquel Dunn

## 2019-02-20 NOTE — Progress Notes (Signed)
BP 118/70   Pulse 89   Temp 98.5 F (36.9 C) (Temporal)   Wt 149 lb (67.6 kg)   LMP 07/31/1981 (Approximate)   BMI 26.39 kg/m    Subjective:    Patient ID: Nancy Mack, female    DOB: 01-Aug-1946, 73 y.o.   MRN: [id number]  HPI: Nancy Mack is a 73 y.o. female  Chief Complaint  Patient presents with  . Sinusitis    pt states she has been having a cough, drainage, runny nose, and scratchy throat for the past week     UPPER RESPIRATORY TRACT INFECTION Has had symptoms x one week.  Initial symptoms cough and congestion. Worst symptom: cough and congestion Fever: no Cough: yes Shortness of breath: no Wheezing: yes Chest pain: no Chest tightness: yes Chest congestion: yes Nasal congestion: yes Runny nose: yes Post nasal drip: yes Sneezing: no Sore throat: yes Swollen glands: no Sinus pressure: yes Headache: no Face pain: no Toothache: no Ear pain: none Ear pressure: none Eyes red/itching: yes Eye drainage/crusting: no  Vomiting: no Rash: no Fatigue: yes Sick contacts: no Strep contacts: no  Context: fluctuating Recurrent sinusitis: no Relief with OTC cold/cough medications: no  Treatments attempted: Tylenol and cold/sinus   Relevant past medical, surgical, family and social history reviewed and updated as indicated. Interim medical history since our last visit reviewed. Allergies and medications reviewed and updated.  Review of Systems  Constitutional: Positive for fatigue. Negative for activity change, appetite change and fever.  HENT: Positive for congestion, postnasal drip, rhinorrhea, sinus pressure, sneezing and sore throat. Negative for ear discharge, ear pain, facial swelling, sinus pain and voice change.   Eyes: Negative for pain and visual disturbance.  Respiratory: Positive for cough and chest tightness. Negative for shortness of breath and wheezing.   Cardiovascular: Negative for chest pain, palpitations and leg swelling.    Gastrointestinal: Negative for abdominal distention, abdominal pain, constipation, diarrhea, nausea and vomiting.  Endocrine: Negative.   Musculoskeletal: Negative for myalgias.  Neurological: Negative for dizziness, numbness and headaches.  Psychiatric/Behavioral: Negative.     Per HPI unless specifically indicated above     Objective:    BP 118/70   Pulse 89   Temp 98.5 F (36.9 C) (Temporal)   Wt 149 lb (67.6 kg)   LMP 07/31/1981 (Approximate)   BMI 26.39 kg/m   Wt Readings from Last 3 Encounters:  02/20/19 149 lb (67.6 kg)  02/20/19 149 lb (67.6 kg)  10/16/18 153 lb 8 oz (69.6 kg)    Physical Exam Vitals signs and nursing note reviewed.  Constitutional:      General: She is awake.     Appearance: She is well-developed.  HENT:     Head: Normocephalic.     Right Ear: Hearing, ear canal and external ear normal. No drainage. A middle ear effusion is present.     Left Ear: Hearing, ear canal and external ear normal. No drainage. A middle ear effusion is present.     Nose: Mucosal edema and rhinorrhea present.     Right Sinus: No maxillary sinus tenderness or frontal sinus tenderness.     Left Sinus: No maxillary sinus tenderness or frontal sinus tenderness.     Mouth/Throat:     Mouth: Mucous membranes are moist.     Pharynx: Oropharynx is clear. Posterior oropharyngeal erythema (mild with cobblestoning) present. No pharyngeal swelling or oropharyngeal exudate.  Eyes:     General: Lids are normal.  Right eye: No discharge.        Left eye: No discharge.     Conjunctiva/sclera: Conjunctivae normal.     Pupils: Pupils are equal, round, and reactive to light.  Neck:     Musculoskeletal: Normal range of motion and neck supple.     Thyroid: No thyromegaly.     Vascular: No carotid bruit or JVD.  Cardiovascular:     Rate and Rhythm: Normal rate and regular rhythm.     Heart sounds: Normal heart sounds.  Pulmonary:     Effort: Pulmonary effort is normal.      Breath sounds: Normal breath sounds.     Comments: Clear throughout Abdominal:     General: Bowel sounds are normal.     Palpations: Abdomen is soft.  Lymphadenopathy:     Head:     Right side of head: No submental, submandibular, tonsillar or preauricular adenopathy.     Left side of head: No submental, submandibular, tonsillar or preauricular adenopathy.     Cervical: No cervical adenopathy.  Skin:    General: Skin is warm and dry.  Neurological:     Mental Status: She is alert and oriented to person, place, and time.  Psychiatric:        Attention and Perception: Attention normal.        Mood and Affect: Mood normal.        Behavior: Behavior normal. Behavior is cooperative.        Thought Content: Thought content normal.        Judgment: Judgment normal.     Results for orders placed or performed in visit on 02/20/19  HM COLONOSCOPY  Result Value Ref Range   [facility] Colonoscopy See Report (in chart) See Report (in chart), Patient Reported      Assessment & Plan:   Problem List Items Addressed This Visit    None    Visit Diagnoses    Bronchitis    -  Primary   Ongoing, worsening cough x 1 week.  Script for Zpack and Tussionex sent to pharmacy.  Continue simple treatment at home.  Return for worsening or continued symp       Follow up plan: Return if symptoms worsen or fail to improve.

## 2019-02-20 NOTE — Progress Notes (Signed)
Subjective:   Nancy Mack is a 73 y.o. female who presents for Medicare Annual (Subsequent) preventive examination.  Review of Systems:   Cardiac Risk Factors include: advanced age (>32mn, >>67women);hypertension;dyslipidemia     Objective:     Vitals: BP 118/70 (BP Location: Left Arm, Patient Position: Sitting, Cuff Size: Normal)   Pulse 89   Temp 98.5 F (36.9 C) (Temporal)   Resp 16   Ht 5' 3"  (1.6 m)   Wt 149 lb (67.6 kg)   LMP 07/31/1981 (Approximate)   BMI 26.39 kg/m   Body mass index is 26.39 kg/m.  Advanced Directives 02/20/2019 04/01/2018 02/18/2018 01/23/2017 11/05/2015  Does Patient Have a Medical Advance Directive? No No No No No  Does patient want to make changes to medical advance directive? - - Yes (MAU/Ambulatory/Procedural Areas - Information given) - -  Would patient like information on creating a medical advance directive? Yes (MAU/Ambulatory/Procedural Areas - Information given) No - Patient declined - - -    Tobacco Social History   Tobacco Use  Smoking Status Former Smoker  . Packs/day: 2.00  . Years: 2.00  . Pack years: 4.00  . Types: Cigarettes  . Last attempt to quit: 12/18/1978  . Years since quitting: 40.2  Smokeless Tobacco Never Used  Tobacco Comment   quit in 1980     Counseling given: Not Answered Comment: quit in 1980   Clinical Intake:  Pre-visit preparation completed: Yes  Pain : No/denies pain     Nutritional Status: BMI 25 -29 Overweight Nutritional Risks: None Diabetes: No  How often do you need to have someone help you when you read instructions, pamphlets, or other written materials from your doctor or pharmacy?: 1 - Never What is the last grade level you completed in school?: BS   Interpreter Needed?: No  Information entered by :: Lytle Malburg,LPN   Past Medical History:  Diagnosis Date  . Allergy   . Asthma   . Carpal tunnel syndrome   . Eczema   . Globus pharyngeus   . Hyperlipidemia   .  Hypertension   . Laryngopharyngeal reflux   . Menopause   . Neuroma    right foot  . Osteoporosis   . Raynaud's disease   . Rhinitis due to pollen   . SOB (shortness of breath)    Past Surgical History:  Procedure Laterality Date  . EYE SURGERY Left 2015   broken blood veesel, Dr.Matthews   . EYE SURGERY Bilateral 2017   In [place]   . FOOT NEUROMA SURGERY Right   . PARTIAL HYSTERECTOMY    . SHOULDER ARTHROSCOPY WITH BICEPSTENOTOMY Right 04/08/2018   Procedure: SHOULDER ARTHROSCOPY WITH BICEPSTENOTOMY;  Surgeon: BLovell Sheehan MD;  Location: [facility] ORS;  Service: Orthopedics;  Laterality: Right;   Family History  Problem Relation Age of Onset  . Arthritis Mother   . Heart failure Mother   . Hypertension Mother   . Hyperlipidemia Mother   . Heart disease Mother   . Dementia Mother   . Asthma Father   . Aneurysm Sister   . Alzheimer's disease Maternal Grandmother    Social History   Socioeconomic History  . Marital status: Married    Spouse name: Not on file  . Number of children: Not on file  . Years of education: Not on file  . Highest education level: Bachelor's degree (e.g., BA, AB, BS)  Occupational History  . Occupation: retired  SScientific laboratory technician . Financial resource strain:  Not hard at all  . Food insecurity:    Worry: Never true    Inability: Never true  . Transportation needs:    Medical: No    Non-medical: No  Tobacco Use  . Smoking status: Former Smoker    Packs/day: 2.00    Years: 2.00    Pack years: 4.00    Types: Cigarettes    Last attempt to quit: 12/18/1978    Years since quitting: 40.2  . Smokeless tobacco: Never Used  . Tobacco comment: quit in 1980  Substance and Sexual Activity  . Alcohol use: Not Currently    Alcohol/week: 0.0 standard drinks    Comment: wine on special occasions  . Drug use: No  . Sexual activity: Yes  Lifestyle  . Physical activity:    Days per week: 0 days    Minutes per session: 0 min  . Stress: Not at all    Relationships  . Social connections:    Talks on phone: More than three times a week    Gets together: More than three times a week    Attends religious service: More than 4 times per year    Active member of club or organization: No    Attends meetings of clubs or organizations: Never    Relationship status: Married  Other Topics Concern  . Not on file  Social History Narrative   Attends church    Outpatient Encounter Medications as of 02/20/2019  Medication Sig  . albuterol (PROAIR HFA) 108 (90 Base) MCG/ACT inhaler Inhale 2 puffs into the lungs every 6 (six) hours as needed.  Marland Kitchen aspirin 81 MG tablet Take 81 mg by mouth daily.  Marland Kitchen atorvastatin (LIPITOR) 20 MG tablet Take 1 tablet (20 mg total) by mouth daily.  Marland Kitchen azelastine (ASTELIN) 0.1 % nasal spray Place 1 spray 2 (two) times daily into both nostrils. Use in each nostril as directed  . Cholecalciferol (VITAMIN D) 2000 UNITS tablet Take 2,000 Units by mouth daily.  Marland Kitchen estradiol (ESTRACE) 2 MG tablet Take 1 tablet (2 mg total) by mouth daily.  . fluticasone (FLONASE) 50 MCG/ACT nasal spray Place 2 sprays into both nostrils daily.  . hydrochlorothiazide (HYDRODIURIL) 25 MG tablet Take 0.5 tablets (12.5 mg total) by mouth daily.  . meloxicam (MOBIC) 15 MG tablet meloxicam 15 mg tablet  . triamcinolone cream (KENALOG) 0.1 % Apply 1 application topically 2 (two) times daily. (Patient taking differently: Apply 1 application topically 2 (two) times daily as needed (irritation). )  . ADVAIR DISKUS 500-50 MCG/DOSE AEPB Inhale 1 puff into the lungs 2 (two) times daily. (Patient not taking: Reported on 02/20/2019)  . [DISCONTINUED] Zoster Vaccine Adjuvanted [facility]) injection Shingrix (PF) 50 mcg/0.5 mL intramuscular suspension, kit   No facility-administered encounter medications on file as of 02/20/2019.     Activities of Daily Living In your present state of health, do you have any difficulty performing the following activities: 02/20/2019  04/01/2018  Hearing? N N  Vision? N N  Difficulty concentrating or making decisions? Y N  Comment comes back to you  -  Walking or climbing stairs? N N  Dressing or bathing? N N  Doing errands, shopping? N N  Preparing Food and eating ? N -  Using the Toilet? N -  In the past six months, have you accidently leaked urine? N -  Do you have problems with loss of bowel control? N -  Managing your Medications? N -  Managing your Finances? N -  Housekeeping or managing your Housekeeping? N -  Some recent data might be hidden    Patient Care Team: Venita Lick, NP as PCP - General (Nurse Practitioner) Kathrine Haddock, NP (Nurse Practitioner) [name], MD as Consulting Physician (Gastroenterology) Clyde Canterbury, MD as Referring Physician (Otolaryngology) [employer] [facility])    Assessment:   This is a routine wellness examination for Nancy Mack.  Exercise Activities and Dietary recommendations Current Exercise Habits: The patient does not participate in regular exercise at present, Exercise limited by: None identified  Goals    . DIET - INCREASE WATER INTAKE     Recommend drinking at least 6-8 glasses of water a day.        Fall Risk Fall Risk  02/20/2019 02/18/2018 01/25/2018 01/23/2017 11/06/2016  Falls in the past year? 0 No No No No   FALL RISK PREVENTION PERTAINING TO THE HOME:  Any stairs in or around the home? No  If so, are there any without handrails? n/a  Home free of loose throw rugs in walkways, pet beds, electrical cords, etc? Yes  Adequate lighting in your home to reduce risk of falls? Yes   ASSISTIVE DEVICES UTILIZED TO PREVENT FALLS:  Life alert? No  Use of a cane, walker or w/c? No  Grab bars in the bathroom? No  Shower chair or bench in shower? No  Elevated toilet seat or a handicapped toilet? No   DME ORDERS:  DME order needed?  No   TIMED UP AND GO:  Was the test performed? Yes .  Length of time to ambulate 10 feet: 10 sec.    GAIT:  Appearance of gait: Gait stead-fast without the use of an assistive device.  Education: Fall risk prevention has been discussed.  Intervention(s) required? No    Depression Screen PHQ 2/9 Scores 02/20/2019 02/18/2018 01/25/2018 01/23/2017  PHQ - 2 Score 0 0 0 0  PHQ- 9 Score - 0 0 0     Cognitive Function     6CIT Screen 02/18/2018  What Year? 0 points  What month? 0 points  What time? 0 points  Count back from 20 0 points  Months in reverse 0 points  Repeat phrase 2 points  Total Score 2    Immunization History  Administered Date(s) Administered  . Influenza, High Dose Seasonal PF 09/21/2016, 10/02/2017, 10/23/2018  . Influenza,inj,Quad PF,6+ Mos 10/01/2015  . Influenza-Unspecified 09/10/2012, 09/15/2014, 09/21/2016  . Pneumococcal Conjugate-13 01/22/2015  . Pneumococcal Polysaccharide-23 06/05/2012  . Pneumococcal-Unspecified 10/19/2014  . Td 08/26/2009  . Zoster 03/13/2007    Qualifies for Shingles Vaccine?Yes  Zostavax completed 03/13/2007. Due for Shingrix. Education has been provided regarding the importance of this vaccine. Pt has been advised to call insurance company to determine out of pocket expense. Advised may also receive vaccine at local pharmacy or Health Dept. Verbalized acceptance and understanding.  Tdap: up to date   Flu Vaccine: up to date   Pneumococcal Vaccine: up to date   Screening Tests Health Maintenance  Topic Date Due  . TETANUS/TDAP  08/27/2019  . MAMMOGRAM  03/27/2020  . COLONOSCOPY  03/03/2025  . INFLUENZA VACCINE  Completed  . DEXA SCAN  Completed  . Hepatitis C Screening  Completed  . PNA vac Low Risk Adult  Completed    Cancer Screenings:  Colorectal Screening: Completed 03/04/2015. Repeat every 10 years  Mammogram: Completed 03/27/2018. Repeat every year.  Bone Density: Completed 05/18/2016.   Lung Cancer Screening: (Low Dose CT Chest recommended if Age 85-80  years, 30 pack-year currently smoking OR have quit w/in  15years.) does not qualify.    Additional Screening:  Hepatitis C Screening: does qualify; Completed 11/05/2015  Vision Screening: Recommended annual ophthalmology exams for early detection of glaucoma and other disorders of the eye. Is the patient up to date with their annual eye exam?  Yes  Who is the provider or what is the name of the office in which the pt attends annual eye exams? [facility]    Dental Screening: Recommended annual dental exams for proper oral hygiene  Community Resource Referral:  CRR required this visit?  No      Plan:    I have personally reviewed and addressed the Medicare Annual Wellness questionnaire and have noted the following in the patient's chart:  A. Medical and social history B. Use of alcohol, tobacco or illicit drugs  C. Current medications and supplements D. Functional ability and status E.  Nutritional status F.  Physical activity G. Advance directives H. List of other physicians I.  Hospitalizations, surgeries, and ER visits in previous 12 months J.  [place] such as hearing and vision if needed, cognitive and depression L. Referrals and appointments   In addition, I have reviewed and discussed with patient certain preventive protocols, quality metrics, and best practice recommendations. A written personalized care plan for preventive services as well as general preventive health recommendations were provided to patient.   Signed,  Tyler Aas, LPN Nurse Health Advisor   Nurse Notes: Patient complaining of scratchy throat, runny nose and cough for the last week. Requesting to see Jolene Cannady,NP today. Patient schedule for same day sick visit.

## 2019-02-21 ENCOUNTER — Ambulatory Visit: Payer: No Typology Code available for payment source

## 2019-02-24 ENCOUNTER — Other Ambulatory Visit: Payer: Self-pay

## 2019-02-24 ENCOUNTER — Encounter: Payer: Self-pay | Admitting: *Deleted

## 2019-02-24 ENCOUNTER — Encounter: Payer: Self-pay | Admitting: Nurse Practitioner

## 2019-02-24 ENCOUNTER — Telehealth: Payer: Self-pay | Admitting: Nurse Practitioner

## 2019-02-24 ENCOUNTER — Telehealth: Payer: Self-pay

## 2019-02-24 ENCOUNTER — Other Ambulatory Visit: Payer: Self-pay | Admitting: Nurse Practitioner

## 2019-02-24 DIAGNOSIS — Z1211 Encounter for screening for malignant neoplasm of colon: Secondary | ICD-10-CM

## 2019-02-24 MED ORDER — ONDANSETRON HCL 4 MG PO TABS: 4.0000 mg | ORAL_TABLET | Freq: Three times a day (TID) | ORAL | 0 refills | Status: DC | PRN

## 2019-02-24 MED ORDER — NA SULFATE-K SULFATE-MG SULF 17.5-3.13-1.6 GM/177ML PO SOLN: 1.0000 | Freq: Once | ORAL | 0 refills | Status: AC

## 2019-02-24 MED ORDER — AMOXICILLIN-POT CLAVULANATE 875-125 MG PO TABS: 1.0000 | ORAL_TABLET | Freq: Two times a day (BID) | ORAL | 0 refills | Status: AC

## 2019-02-24 NOTE — Progress Notes (Signed)
Patient reports allergy with recent Zpack = rash.  She has stopped taking and was taking Benadryl, but this made her tired.  She also complained of some nausea.  Will send in script for Augmentin and Zofran for nausea.  Have recommended Claritin, Allegra, or Zyrtec daily for rash.

## 2019-02-24 NOTE — Telephone Encounter (Signed)
This encounter was created in error - please disregard.

## 2019-02-24 NOTE — Telephone Encounter (Signed)
Pt left vm to schedule colonoscopy  °

## 2019-02-24 NOTE — Telephone Encounter (Signed)
Gastroenterology Pre-Procedure Review  Request Date: 03/04/19 Requesting Physician: Dr. Allen Norris  PATIENT REVIEW QUESTIONS: The patient responded to the following health history questions as indicated:    1. Are you having any GI issues? no 2. Do you have a personal history of Polyps? no 3. Do you have a family history of Colon Cancer or Polyps? no 4. Diabetes Mellitus? no 5. Joint replacements in the past 12 months?no 6. Major health problems in the past 3 months?no 7. Any artificial heart valves, MVP, or defibrillator?no    MEDICATIONS & ALLERGIES:    Patient reports the following regarding taking any anticoagulation/antiplatelet therapy:   Plavix, Coumadin, Eliquis, Xarelto, Lovenox, Pradaxa, Brilinta, or Effient? no Aspirin? yes (81 mg)  Patient confirms/reports the following medications:  Current Outpatient Medications  Medication Sig Dispense Refill  . ADVAIR DISKUS 500-50 MCG/DOSE AEPB Inhale 1 puff into the lungs 2 (two) times daily. (Patient not taking: Reported on 02/20/2019) 180 each 0  . albuterol (PROAIR HFA) 108 (90 Base) MCG/ACT inhaler Inhale 2 puffs into the lungs every 6 (six) hours as needed. 3 Inhaler 3  . aspirin 81 MG tablet Take 81 mg by mouth daily.    Marland Kitchen atorvastatin (LIPITOR) 20 MG tablet Take 1 tablet (20 mg total) by mouth daily. 90 tablet 5  . azelastine (ASTELIN) 0.1 % nasal spray Place 1 spray 2 (two) times daily into both nostrils. Use in each nostril as directed 30 mL 2  . azithromycin (ZITHROMAX) 250 MG tablet Take two tablets (500 MG) day one and one tablet (250 MG) days 2-5 6 tablet 0  . chlorpheniramine-HYDROcodone (TUSSIONEX PENNKINETIC ER) 10-8 MG/5ML SUER Take 5 mLs by mouth every 12 (twelve) hours as needed for cough. 60 mL 0  . Cholecalciferol (VITAMIN D) 2000 UNITS tablet Take 2,000 Units by mouth daily.    Marland Kitchen estradiol (ESTRACE) 2 MG tablet Take 1 tablet (2 mg total) by mouth daily. 90 tablet 3  . fluticasone (FLONASE) 50 MCG/ACT nasal spray Place 2  sprays into both nostrils daily. 48 g 3  . hydrochlorothiazide (HYDRODIURIL) 25 MG tablet Take 0.5 tablets (12.5 mg total) by mouth daily. 15 tablet 5  . meloxicam (MOBIC) 15 MG tablet meloxicam 15 mg tablet    . triamcinolone cream (KENALOG) 0.1 % Apply 1 application topically 2 (two) times daily. (Patient taking differently: Apply 1 application topically 2 (two) times daily as needed (irritation). ) 30 g 0   No current facility-administered medications for this visit.     Patient confirms/reports the following allergies:  Allergies  Allergen Reactions  . Prednisone Palpitations and Other (See Comments)    increased heartrate  . Tape Rash    Adhesive tape    No orders of the defined types were placed in this encounter.   AUTHORIZATION INFORMATION Primary Insurance: 1D#: Group #:  Secondary Insurance: 1D#: Group #:  SCHEDULE INFORMATION: Date: 03/04/19 Time: Location:[facility]

## 2019-02-24 NOTE — Telephone Encounter (Signed)
Pt called this AM 1100, CPM placed. States started Zithromax Thursday, took that day and Friday. Developed rash on arms and neck, nausea. States called pharmacist who told her to stop taking and to take benadryl. Pt states itching remains, rash resolving. Also reports lightheaded. HAs been taking benadryl but "Makes me too tired." States is nauseated but staying hydrated. Pt requesting alternative medication, advise for itching, nausea.TN called practice, spoke with Santiago Glad to alert her of patient's return call. States will have someone call pt back before they leave office this evening. Pt aware.

## 2019-02-25 NOTE — Telephone Encounter (Signed)
Spoke to [place], she stated that she spoke with this patient.

## 2019-02-25 NOTE — Telephone Encounter (Signed)
I did leave message for this patient last night and sent in scripts for her.  Left message with her again this morning to call office.  If she calls please let me know.  Thanks.

## 2019-03-04 ENCOUNTER — Ambulatory Visit
Admission: RE | Admit: 2019-03-04 | Discharge: 2019-03-04 | Disposition: A | Discharge status: Home / Self Care | Payer: Medicare Other | Attending: Gastroenterology | Admitting: Gastroenterology

## 2019-03-04 ENCOUNTER — Ambulatory Visit: Payer: Medicare Other | Admitting: Anesthesiology

## 2019-03-04 ENCOUNTER — Encounter
Admission: RE | Disposition: A | Discharge status: Home / Self Care | Payer: Self-pay | Source: Home / Self Care | Attending: Gastroenterology

## 2019-03-04 ENCOUNTER — Encounter: Payer: Self-pay | Admitting: *Deleted

## 2019-03-04 DIAGNOSIS — Z888 Allergy status to other drugs, medicaments and biological substances status: Secondary | ICD-10-CM | POA: Diagnosis not present

## 2019-03-04 DIAGNOSIS — Z90710 Acquired absence of both cervix and uterus: Secondary | ICD-10-CM | POA: Diagnosis not present

## 2019-03-04 DIAGNOSIS — K640 First degree hemorrhoids: Secondary | ICD-10-CM | POA: Diagnosis not present

## 2019-03-04 DIAGNOSIS — Z820 Family history of epilepsy and other diseases of the nervous system: Secondary | ICD-10-CM | POA: Diagnosis not present

## 2019-03-04 DIAGNOSIS — G5600 Carpal tunnel syndrome, unspecified upper limb: Secondary | ICD-10-CM | POA: Diagnosis not present

## 2019-03-04 DIAGNOSIS — Z7951 Long term (current) use of inhaled steroids: Secondary | ICD-10-CM | POA: Diagnosis not present

## 2019-03-04 DIAGNOSIS — K219 Gastro-esophageal reflux disease without esophagitis: Secondary | ICD-10-CM | POA: Diagnosis not present

## 2019-03-04 DIAGNOSIS — M810 Age-related osteoporosis without current pathological fracture: Secondary | ICD-10-CM | POA: Insufficient documentation

## 2019-03-04 DIAGNOSIS — E785 Hyperlipidemia, unspecified: Secondary | ICD-10-CM | POA: Insufficient documentation

## 2019-03-04 DIAGNOSIS — J45909 Unspecified asthma, uncomplicated: Secondary | ICD-10-CM | POA: Diagnosis not present

## 2019-03-04 DIAGNOSIS — I7300 Raynaud's syndrome without gangrene: Secondary | ICD-10-CM | POA: Insufficient documentation

## 2019-03-04 DIAGNOSIS — Z87891 Personal history of nicotine dependence: Secondary | ICD-10-CM | POA: Diagnosis not present

## 2019-03-04 DIAGNOSIS — I10 Essential (primary) hypertension: Secondary | ICD-10-CM | POA: Diagnosis not present

## 2019-03-04 DIAGNOSIS — Z79899 Other long term (current) drug therapy: Secondary | ICD-10-CM | POA: Diagnosis not present

## 2019-03-04 DIAGNOSIS — Z1211 Encounter for screening for malignant neoplasm of colon: Secondary | ICD-10-CM | POA: Diagnosis not present

## 2019-03-04 DIAGNOSIS — Z8261 Family history of arthritis: Secondary | ICD-10-CM | POA: Insufficient documentation

## 2019-03-04 DIAGNOSIS — Z7982 Long term (current) use of aspirin: Secondary | ICD-10-CM | POA: Diagnosis not present

## 2019-03-04 DIAGNOSIS — Z881 Allergy status to other antibiotic agents status: Secondary | ICD-10-CM | POA: Diagnosis not present

## 2019-03-04 DIAGNOSIS — Z91048 Other nonmedicinal substance allergy status: Secondary | ICD-10-CM | POA: Insufficient documentation

## 2019-03-04 DIAGNOSIS — Z8249 Family history of ischemic heart disease and other diseases of the circulatory system: Secondary | ICD-10-CM | POA: Insufficient documentation

## 2019-03-04 HISTORY — PX: COLONOSCOPY WITH PROPOFOL: SHX5780

## 2019-03-04 SURGERY — COLONOSCOPY WITH PROPOFOL
Anesthesia: General

## 2019-03-04 MED ORDER — SODIUM CHLORIDE 0.9 % IV SOLN
INTRAVENOUS | Status: DC
  Administered 2019-03-04: 1000 mL via INTRAVENOUS

## 2019-03-04 MED ORDER — GLYCOPYRROLATE PF 0.2 MG/ML IJ SOSY
PREFILLED_SYRINGE | INTRAMUSCULAR | Status: DC | PRN
  Administered 2019-03-04: .2 mg via INTRAVENOUS

## 2019-03-04 MED ORDER — PROPOFOL 500 MG/50ML IV EMUL
INTRAVENOUS | Status: DC | PRN
  Administered 2019-03-04: 110 ug/kg/min via INTRAVENOUS

## 2019-03-04 MED ORDER — PROPOFOL 10 MG/ML IV BOLUS
INTRAVENOUS | Status: DC | PRN
  Administered 2019-03-04: 90 mg via INTRAVENOUS

## 2019-03-04 NOTE — Anesthesia Post-op Follow-up Note (Signed)
Anesthesia QCDR form completed.        

## 2019-03-04 NOTE — Transfer of Care (Signed)
Immediate Anesthesia Transfer of Care Note  Patient: Nancy Mack  Procedure(s) Performed: COLONOSCOPY WITH PROPOFOL (N/A )  Patient Location: Endoscopy Unit  Anesthesia Type:General  Level of Consciousness: sedated and responds to stimulation  Airway & Oxygen Therapy: Patient Spontanous Breathing and Patient connected to nasal cannula oxygen  Post-op Assessment: Report given to RN and Post -op Vital signs reviewed and stable  Post vital signs: Reviewed and stable  Last Vitals:  Vitals Value Taken Time  BP 127/77 03/04/2019  9:12 AM  Temp 36.7 C 03/04/2019  9:12 AM  Pulse 83 03/04/2019  9:14 AM  Resp 20 03/04/2019  9:14 AM  SpO2 100 % 03/04/2019  9:14 AM  Vitals shown include unvalidated device data.  Last Pain:  Vitals:   03/04/19 0912  TempSrc: Tympanic  PainSc:          Complications: No apparent anesthesia complications

## 2019-03-04 NOTE — Anesthesia Preprocedure Evaluation (Signed)
Anesthesia Evaluation  Patient identified by MRN, date of birth, ID band Patient awake    Reviewed: Allergy & Precautions, NPO status , Patient's Chart, lab work & pertinent test results  History of Anesthesia Complications Negative for: history of anesthetic complications  Airway Mallampati: II  TM Distance: >3 FB Neck ROM: Full    Dental no notable dental hx.    Pulmonary asthma , neg sleep apnea, former smoker,    breath sounds clear to auscultation- rhonchi (-) wheezing      Cardiovascular hypertension, Pt. on medications (-) CAD, (-) Past MI, (-) Cardiac Stents and (-) CABG  Rhythm:Regular Rate:Normal - Systolic murmurs and - Diastolic murmurs    Neuro/Psych neg Seizures negative neurological ROS  negative psych ROS   GI/Hepatic Neg liver ROS, GERD  ,  Endo/Other  negative endocrine ROSneg diabetes  Renal/GU negative Renal ROS     Musculoskeletal  (+) Arthritis ,   Abdominal (+) - obese,   Peds  Hematology negative hematology ROS (+)   Anesthesia Other Findings Past Medical History: No date: Allergy No date: Asthma No date: Carpal tunnel syndrome No date: Eczema No date: Globus pharyngeus No date: Hyperlipidemia No date: Hypertension No date: Laryngopharyngeal reflux No date: Menopause No date: Neuroma     Comment:  right foot No date: Osteoporosis No date: Raynaud's disease No date: Rhinitis due to pollen No date: SOB (shortness of breath)   Reproductive/Obstetrics                             Anesthesia Physical Anesthesia Plan  ASA: II  Anesthesia Plan: General   Post-op Pain Management:    Induction: Intravenous  PONV Risk Score and Plan: 2 and Propofol infusion  Airway Management Planned: Natural Airway  Additional Equipment:   Intra-op Plan:   Post-operative Plan:   Informed Consent: I have reviewed the patients History and Physical, chart, labs and  discussed the procedure including the risks, benefits and alternatives for the proposed anesthesia with the patient or authorized representative who has indicated his/her understanding and acceptance.     Dental advisory given  Plan Discussed with: CRNA and Anesthesiologist  Anesthesia Plan Comments:         Anesthesia Quick Evaluation

## 2019-03-04 NOTE — Op Note (Signed)
[facility] Gastroenterology Patient Name: Nancy Mack Procedure Date: 03/04/2019 8:44 AM MRN: [id number] Account #: [id number] Date of Birth: Dec 06, 1946 Admit Type: Outpatient Age: 73 Room: [facility] ENDO ROOM 4 Gender: Female Note Status: Finalized Procedure:            Colonoscopy Indications:          Screening for colorectal malignant neoplasm Providers:            [name] MD, MD Referring MD:         No Local Md, MD (Referring MD) Medicines:            Propofol per Anesthesia Complications:        No immediate complications. Procedure:            Pre-Anesthesia Assessment:                       - Prior to the procedure, a History and Physical was                        performed, and patient medications and allergies were                        reviewed. The patient's tolerance of previous                        anesthesia was also reviewed. The risks and benefits of                        the procedure and the sedation options and risks were                        discussed with the patient. All questions were                        answered, and informed consent was obtained. Prior                        Anticoagulants: The patient has taken no previous                        anticoagulant or antiplatelet agents. ASA Grade                        Assessment: II - A patient with mild systemic disease.                        After reviewing the risks and benefits, the patient was                        deemed in satisfactory condition to undergo the                        procedure.                       After obtaining informed consent, the colonoscope was                        passed under direct vision. Throughout the procedure,  the patient's blood pressure, pulse, and oxygen                        saturations were monitored continuously. The                        Colonoscope was introduced through the anus and                         advanced to the the cecum, identified by appendiceal                        orifice and ileocecal valve. The colonoscopy was                        performed without difficulty. The patient tolerated the                        procedure well. The quality of the bowel preparation                        was excellent. Findings:      The perianal and digital rectal examinations were normal.      The perianal and digital rectal examinations were normal.      Non-bleeding internal hemorrhoids were found during retroflexion. The       hemorrhoids were Grade I (internal hemorrhoids that do not prolapse). Impression:           - Non-bleeding internal hemorrhoids.                       - No specimens collected. Recommendation:       - Discharge patient to home.                       - Resume previous diet.                       - Continue present medications.                       - Repeat colonoscopy in 10 years for screening unless                        any change in family history or lower GI problems. Procedure Code(s):    --- Professional ---                       [phone], Colonoscopy, flexible; diagnostic, including                        collection of specimen(s) by brushing or washing, when                        performed (separate procedure) Diagnosis Code(s):    --- Professional ---                       Z12.11, Encounter for screening for malignant neoplasm                        of colon CPT copyright 2018 American Medical Association. All rights reserved.  The codes documented in this report are preliminary and upon coder review may  be revised to meet current compliance requirements. [name] MD, MD 03/04/2019 9:10:11 AM This report has been signed electronically. Number of Addenda: 0 Note Initiated On: 03/04/2019 8:44 AM Scope Withdrawal Time: 0 hours 6 minutes 14 seconds  Total Procedure Duration: 0 hours 11 minutes 51 seconds       [facility]

## 2019-03-04 NOTE — Anesthesia Postprocedure Evaluation (Addendum)
Anesthesia Post Note  Patient: Nancy Mack  Procedure(s) Performed: COLONOSCOPY WITH PROPOFOL (N/A )  Patient location during evaluation: Endoscopy Anesthesia Type: General Level of consciousness: awake and alert and oriented Pain management: pain level controlled Vital Signs Assessment: post-procedure vital signs reviewed and stable Respiratory status: spontaneous breathing, nonlabored ventilation and respiratory function stable Cardiovascular status: blood pressure returned to baseline and stable Postop Assessment: no signs of nausea or vomiting Anesthetic complications: no     Last Vitals:  Vitals:   03/04/19 0930 03/04/19 0940  BP: 133/81 125/79  Pulse: 83 85  Resp: 18 (!) 21  Temp:    SpO2: 98% 100%    Last Pain:  Vitals:   03/04/19 0912  TempSrc: Tympanic  PainSc:                  Luisfernando Brightwell

## 2019-03-04 NOTE — H&P (Signed)
Nancy Lame, MD [facility] [address], [place], [place] [address] Phone: [phone] Fax : [phone]  Primary Care Physician:  Venita Lick, NP Primary Gastroenterologist:  Dr. Allen Norris  Pre-Procedure History & Physical: HPI:  Nancy Mack is a 73 y.o. female is here for a screening colonoscopy.   Past Medical History:  Diagnosis Date  . Allergy   . Asthma   . Carpal tunnel syndrome   . Eczema   . Globus pharyngeus   . Hyperlipidemia   . Hypertension   . Laryngopharyngeal reflux   . Menopause   . Neuroma    right foot  . Osteoporosis   . Raynaud's disease   . Rhinitis due to pollen   . SOB (shortness of breath)     Past Surgical History:  Procedure Laterality Date  . EYE SURGERY Left 2015   broken blood veesel, Dr.Matthews   . EYE SURGERY Bilateral 2017   In [place]   . FOOT NEUROMA SURGERY Right   . PARTIAL HYSTERECTOMY    . SHOULDER ARTHROSCOPY WITH BICEPSTENOTOMY Right 04/08/2018   Procedure: SHOULDER ARTHROSCOPY WITH BICEPSTENOTOMY;  Surgeon: Lovell Sheehan, MD;  Location: [facility] ORS;  Service: Orthopedics;  Laterality: Right;    Prior to Admission medications   Medication Sig Start Date End Date Taking? Authorizing Provider  ADVAIR DISKUS 500-50 MCG/DOSE AEPB Inhale 1 puff into the lungs 2 (two) times daily. Patient not taking: Reported on 02/20/2019 05/07/17   Park Liter P, DO  albuterol [facility] HFA) 108 (90 Base) MCG/ACT inhaler Inhale 2 puffs into the lungs every 6 (six) hours as needed. 02/09/17   Kathrine Haddock, NP  aspirin 81 MG tablet Take 81 mg by mouth daily.    [provider]  atorvastatin (LIPITOR) 20 MG tablet Take 1 tablet (20 mg total) by mouth daily. 10/16/18   Cannady, Henrine Screws T, NP  azelastine (ASTELIN) 0.1 % nasal spray Place 1 spray 2 (two) times daily into both nostrils. Use in each nostril as directed 10/22/17   Kathrine Haddock, NP  chlorpheniramine-HYDROcodone [facility]) 10-8 MG/5ML SUER Take 5  mLs by mouth every 12 (twelve) hours as needed for cough. 02/20/19   Marnee Guarneri T, NP  Cholecalciferol (VITAMIN D) 2000 UNITS tablet Take 2,000 Units by mouth daily.    [provider]  estradiol (ESTRACE) 2 MG tablet Take 1 tablet (2 mg total) by mouth daily. 10/16/18   Cannady, Henrine Screws T, NP  fluticasone (FLONASE) 50 MCG/ACT nasal spray Place 2 sprays into both nostrils daily. 10/16/18   Cannady, Henrine Screws T, NP  hydrochlorothiazide (HYDRODIURIL) 25 MG tablet Take 0.5 tablets (12.5 mg total) by mouth daily. 10/16/18 02/20/19  Cannady, Henrine Screws T, NP  meloxicam (MOBIC) 15 MG tablet meloxicam 15 mg tablet    [provider]  ondansetron (ZOFRAN) 4 MG tablet Take 1 tablet (4 mg total) by mouth every 8 (eight) hours as needed for nausea or vomiting. 02/24/19   Cannady, Henrine Screws T, NP  triamcinolone cream (KENALOG) 0.1 % Apply 1 application topically 2 (two) times daily. Patient taking differently: Apply 1 application topically 2 (two) times daily as needed (irritation).  04/23/17   Kathrine Haddock, NP    Allergies as of 02/24/2019 - Review Complete 02/20/2019  Allergen Reaction Noted  . Azithromycin Rash 02/24/2019  . Prednisone Palpitations and Other (See Comments) 08/28/2014  . Tape Rash 04/01/2018    Family History  Problem Relation Age of Onset  . Arthritis Mother   . Heart  failure Mother   . Hypertension Mother   . Hyperlipidemia Mother   . Heart disease Mother   . Dementia Mother   . Asthma Father   . Aneurysm Sister   . Alzheimer's disease Maternal Grandmother     Social History   Socioeconomic History  . Marital status: Married    Spouse name: Not on file  . Number of children: Not on file  . Years of education: Not on file  . Highest education level: Bachelor's degree (e.g., BA, AB, BS)  Occupational History  . Occupation: retired  Scientific laboratory technician  . Financial resource strain: Not hard at all  . Food insecurity:    Worry: Never true    Inability: Never true  .  Transportation needs:    Medical: No    Non-medical: No  Tobacco Use  . Smoking status: Former Smoker    Packs/day: 2.00    Years: 2.00    Pack years: 4.00    Types: Cigarettes    Last attempt to quit: 12/18/1978    Years since quitting: 40.2  . Smokeless tobacco: Never Used  . Tobacco comment: quit in 1980  Substance and Sexual Activity  . Alcohol use: Not Currently    Alcohol/week: 0.0 standard drinks    Comment: wine on special occasions  . Drug use: No  . Sexual activity: Yes  Lifestyle  . Physical activity:    Days per week: 0 days    Minutes per session: 0 min  . Stress: Not at all  Relationships  . Social connections:    Talks on phone: More than three times a week    Gets together: More than three times a week    Attends religious service: More than 4 times per year    Active member of club or organization: No    Attends meetings of clubs or organizations: Never    Relationship status: Married  . Intimate partner violence:    Fear of current or ex partner: No    Emotionally abused: No    Physically abused: No    Forced sexual activity: No  Other Topics Concern  . Not on file  Social History Narrative   Attends church    Review of Systems: See HPI, otherwise negative ROS  Physical Exam: BP (!) 151/83   Pulse 87   Temp 98.1 F (36.7 C) (Oral)   Resp 20   Ht 5\' 3"  (1.6 m)   Wt 66.7 kg   LMP 07/31/1981 (Approximate)   SpO2 100%   BMI 26.04 kg/m  General:   Alert,  pleasant and cooperative in NAD Head:  Normocephalic and atraumatic. Neck:  Supple; no masses or thyromegaly. Lungs:  Clear throughout to auscultation.    Heart:  Regular rate and rhythm. Abdomen:  Soft, nontender and nondistended. Normal bowel sounds, without guarding, and without rebound.   Neurologic:  Alert and  oriented x4;  grossly normal neurologically.  Impression/Plan: Lavida Patch Harling is now here to undergo a screening colonoscopy.  Risks, benefits, and alternatives  regarding colonoscopy have been reviewed with the patient.  Questions have been answered.  All parties agreeable.

## 2019-03-05 ENCOUNTER — Encounter: Payer: Self-pay | Admitting: Gastroenterology

## 2019-04-15 ENCOUNTER — Other Ambulatory Visit: Payer: Self-pay | Admitting: Unknown Physician Specialty

## 2019-04-15 NOTE — Telephone Encounter (Signed)
Requested medication (s) are due for refill today:   No  Requested medication (s) are on the active medication list:   Yes  Future visit scheduled:   Yes  04/17/2019 with Marnee Guarneri   Last ordered: 10/22/2017   Requested Prescriptions  Pending Prescriptions Disp Refills   Azelastine HCl 137 MCG/SPRAY SOLN [Pharmacy Med Name: AZELASTINE 0.1% (137 MCG) SPRY] 90 mL 0    Sig: Place 1 spray 2 (two) times daily into both nostrils. Use in each nostril as directed     Ear, Nose, and Throat: Nasal Preparations - Antiallergy Passed - 04/15/2019 12:43 PM      Passed - Valid encounter within last 12 months    Recent Outpatient Visits          1 month ago [place], Barbaraann Faster, NP   6 months ago Flu vaccine need   [place], Henrine Screws T, NP   1 year ago Preoperative evaluation to rule out surgical contraindication   [facility] Kathrine Haddock, NP   1 year ago Advanced care planning/counseling discussion   [facility] Kathrine Haddock, NP   1 year ago Acute pain of right shoulder   [facility] Valerie Roys, DO      Future Appointments            In 2 days Volney American, [place], [place]

## 2019-04-17 ENCOUNTER — Other Ambulatory Visit: Payer: Self-pay

## 2019-04-17 ENCOUNTER — Encounter: Payer: Self-pay | Admitting: Family Medicine

## 2019-04-17 ENCOUNTER — Ambulatory Visit (INDEPENDENT_AMBULATORY_CARE_PROVIDER_SITE_OTHER): Payer: Medicare Other | Admitting: Family Medicine

## 2019-04-17 ENCOUNTER — Ambulatory Visit: Payer: Medicare Other | Admitting: Nurse Practitioner

## 2019-04-17 VITALS — BP 120/76 | HR 83 | Temp 97.5°F | Ht 63.0 in | Wt 146.0 lb

## 2019-04-17 DIAGNOSIS — I10 Essential (primary) hypertension: Secondary | ICD-10-CM | POA: Diagnosis not present

## 2019-04-17 DIAGNOSIS — J3089 Other allergic rhinitis: Secondary | ICD-10-CM | POA: Diagnosis not present

## 2019-04-17 DIAGNOSIS — E782 Mixed hyperlipidemia: Secondary | ICD-10-CM | POA: Diagnosis not present

## 2019-04-17 DIAGNOSIS — J45909 Unspecified asthma, uncomplicated: Secondary | ICD-10-CM | POA: Diagnosis not present

## 2019-04-17 MED ORDER — FLUTICASONE-SALMETEROL 500-50 MCG/DOSE IN AEPB: 1.0000 | INHALATION_SPRAY | Freq: Two times a day (BID) | RESPIRATORY_TRACT | 0 refills | Status: DC

## 2019-04-17 MED ORDER — MONTELUKAST SODIUM 10 MG PO TABS: 10.0000 mg | ORAL_TABLET | Freq: Every day | ORAL | 1 refills | Status: DC

## 2019-04-17 MED ORDER — HYDROCHLOROTHIAZIDE 25 MG PO TABS: 12.5000 mg | ORAL_TABLET | Freq: Every day | ORAL | 1 refills | Status: DC

## 2019-04-17 MED ORDER — ALBUTEROL SULFATE HFA 108 (90 BASE) MCG/ACT IN AERS: 2.0000 | INHALATION_SPRAY | Freq: Four times a day (QID) | RESPIRATORY_TRACT | 3 refills | Status: DC | PRN

## 2019-04-17 MED ORDER — ATORVASTATIN CALCIUM 20 MG PO TABS: 20.0000 mg | ORAL_TABLET | Freq: Every day | ORAL | 1 refills | Status: DC

## 2019-04-17 NOTE — Assessment & Plan Note (Signed)
Having breakthrough sxs with all the pollen, but under fairly good control. Continue current regimen and good supportive care

## 2019-04-17 NOTE — Assessment & Plan Note (Signed)
Recheck lipids, adjust as needed. Diet, exercise reviewed

## 2019-04-17 NOTE — Assessment & Plan Note (Signed)
Under good control with advair and prn albuterol. Continue current regimen

## 2019-04-17 NOTE — Progress Notes (Signed)
BP 120/76   Pulse 83   Temp (!) 97.5 F (36.4 C) (Oral)   Ht 5\' 3"  (1.6 m)   Wt 146 lb (66.2 kg)   LMP 07/31/1981 (Approximate)   BMI 25.86 kg/m    Subjective:    Patient ID: Nancy Mack, female    DOB: 11-15-1946, 73 y.o.   MRN: [id number]  HPI: Nancy Mack is a 73 y.o. female  Chief Complaint  Patient presents with  . Hypertension    63m f/u  . Hyperlipidemia  . Medication Refill    advair diskus, proair inhaler, atorvastatin, hydrochlorothiazide, montelukas    . This visit was completed via telephone due to the restrictions of the COVID-19 pandemic. All issues as above were discussed and addressed but no physical exam was performed. If it was felt that the patient should be evaluated in the office, they were directed there. The patient verbally consented to this visit. Patient was unable to complete an audio/visual visit due to Technical difficulties,Lack of internet. Due to the catastrophic nature of the COVID-19 pandemic, this visit was done through audio contact only. . Location of the patient: home . Location of the provider: work . Those involved with this call:  . Provider: Merrie Roof, PA-C . CMA: Lesle Chris, [place] . Front Desk/Registration: Jill Side  . Time spent on call: 25 minutes with patient face to face via video conference. More than 50% of this time was spent in counseling and coordination of care. 5 minutes total spent in review of patient's record and preparation of their chart. I verified patient identity using two factors (patient name and date of birth). Patient consents verbally to being seen via telemedicine visit today.   Patient presenting for 6 month f/u chronic conditions. Taking medicines faithfully without side effects. No new concerns today. Denies CP, SOB, HAs, dizziness, claudication, myalgias, asthma flares in the last 6 months.   Does have some breakthrough rhinorrhea and sneezing when going outside due to high pollen  accumulation in the air this season. Compliant with allergy regimen and sxs improve when she's indoors.   BPs around 120/70s-80s when checked at home, tolerating medicines well.   Cholesterol levels were significantly higher than baseline at last check, and per lab message at that time patient had reported not taking her lipitor but pt states she never stopped the medicine or changed anything. Denies any new issues.   Relevant past medical, surgical, family and social history reviewed and updated as indicated. Interim medical history since our last visit reviewed. Allergies and medications reviewed and updated.  Review of Systems  Per HPI unless specifically indicated above     Objective:    BP 120/76   Pulse 83   Temp (!) 97.5 F (36.4 C) (Oral)   Ht 5\' 3"  (1.6 m)   Wt 146 lb (66.2 kg)   LMP 07/31/1981 (Approximate)   BMI 25.86 kg/m   Wt Readings from Last 3 Encounters:  04/17/19 146 lb (66.2 kg)  03/04/19 147 lb (66.7 kg)  02/20/19 149 lb (67.6 kg)    Physical Exam  Unable to perform PE as she did not have video capabilities for today's visit  Results for orders placed or performed in visit on 02/20/19  HM COLONOSCOPY  Result Value Ref Range   HM Colonoscopy See Report (in chart) See Report (in chart), Patient Reported      Assessment & Plan:   Problem List Items Addressed This Visit  Cardiovascular and Mediastinum   Benign essential HTN - Primary    Stable and WNL, continue current regimen. DASH diet, exercise reviewed      Relevant Medications   hydrochlorothiazide (HYDRODIURIL) 25 MG tablet   atorvastatin (LIPITOR) 20 MG tablet   Other Relevant Orders   Comprehensive metabolic panel     Respiratory   Allergic rhinitis    Having breakthrough sxs with all the pollen, but under fairly good control. Continue current regimen and good supportive care      Asthma    Under good control with advair and prn albuterol. Continue current regimen       Relevant Medications   montelukast (SINGULAIR) 10 MG tablet   albuterol (PROAIR HFA) 108 (90 Base) MCG/ACT inhaler   Fluticasone-Salmeterol (ADVAIR DISKUS) 500-50 MCG/DOSE AEPB     Other   Hyperlipidemia    Recheck lipids, adjust as needed. Diet, exercise reviewed      Relevant Medications   hydrochlorothiazide (HYDRODIURIL) 25 MG tablet   atorvastatin (LIPITOR) 20 MG tablet   Other Relevant Orders   Lipid Panel w/o Chol/HDL Ratio       Follow up plan: Return in about 6 months (around 10/17/2019) for 6 month f/u.

## 2019-04-17 NOTE — Assessment & Plan Note (Signed)
Stable and WNL, continue current regimen. DASH diet, exercise reviewed

## 2019-04-18 ENCOUNTER — Other Ambulatory Visit: Payer: Self-pay

## 2019-04-18 ENCOUNTER — Other Ambulatory Visit: Payer: Medicare Other

## 2019-04-18 DIAGNOSIS — E782 Mixed hyperlipidemia: Secondary | ICD-10-CM | POA: Diagnosis not present

## 2019-04-18 DIAGNOSIS — I10 Essential (primary) hypertension: Secondary | ICD-10-CM | POA: Diagnosis not present

## 2019-04-19 LAB — COMPREHENSIVE METABOLIC PANEL
ALT: 15 IU/L (ref 0–32)
AST: 24 IU/L (ref 0–40)
Albumin/Globulin Ratio: 1.6 (ref 1.2–2.2)
Albumin: 4.2 g/dL (ref 3.7–4.7)
Alkaline Phosphatase: 91 IU/L (ref 39–117)
BUN/Creatinine Ratio: 12 (ref 12–28)
BUN: 11 mg/dL (ref 8–27)
Bilirubin Total: 0.2 mg/dL (ref 0.0–1.2)
CO2: 24 mmol/L (ref 20–29)
Calcium: 9.7 mg/dL (ref 8.7–10.3)
Chloride: 101 mmol/L (ref 96–106)
Creatinine, Ser: 0.91 mg/dL (ref 0.57–1.00)
GFR calc Af Amer: 73 mL/min/{1.73_m2} (ref >59)
GFR calc non Af Amer: 63 mL/min/{1.73_m2} (ref >59)
Globulin, Total: 2.7 g/dL (ref 1.5–4.5)
Glucose: 111 mg/dL — ABNORMAL HIGH (ref 65–99)
Potassium: 4.1 mmol/L (ref 3.5–5.2)
Sodium: 140 mmol/L (ref 134–144)
Total Protein: 6.9 g/dL (ref 6.0–8.5)

## 2019-04-19 LAB — LIPID PANEL W/O CHOL/HDL RATIO
Cholesterol, Total: 211 mg/dL — ABNORMAL HIGH (ref 100–199)
HDL: 76 mg/dL (ref >39)
LDL Calculated: 111 mg/dL — ABNORMAL HIGH (ref 0–99)
Triglycerides: 120 mg/dL (ref 0–149)
VLDL Cholesterol Cal: 24 mg/dL (ref 5–40)

## 2019-06-17 DIAGNOSIS — Z1231 Encounter for screening mammogram for malignant neoplasm of breast: Secondary | ICD-10-CM | POA: Diagnosis not present

## 2019-06-17 LAB — HM MAMMOGRAPHY

## 2019-06-18 ENCOUNTER — Other Ambulatory Visit: Payer: Self-pay | Admitting: Unknown Physician Specialty

## 2019-06-18 NOTE — Telephone Encounter (Signed)
Would Jolene like to prescribe this?

## 2019-07-06 NOTE — Progress Notes (Signed)
BP 133/81   Pulse 89   Temp 98.1 F (36.7 C)   Wt 156 lb 4 oz (70.9 kg)   LMP 07/31/1981 (Approximate)   SpO2 98%   BMI 27.68 kg/m    Subjective:    Patient ID: Nancy Mack, female    DOB: 1946-03-31, 73 y.o.   MRN: [id number]  HPI: Nancy Mack is a 73 y.o. female  Chief Complaint  Patient presents with  . Hip Pain    Left- Patient states that she has had hip pain for years but over the last 3 weeks it has started bothering her again, no known injury. Was dignosed with bursitis and given an inject a couple years ago.   HIP PAIN Duration: chronic, way worse in the last 3 weeks Involved hip: left  Mechanism of injury: unknown Location: lateral Onset: gradual  Severity: 5/10  Quality: aching pain Frequency: constant Radiation: yes Aggravating factors: weight bearing, walking, running, stairs and movement   Alleviating factors: tylenol   Status: worse Treatments attempted: APAP   Relief with NSAIDs?: No NSAIDs Taken Weakness with weight bearing: yes Weakness with walking: yes Paresthesias / decreased sensation: no Swelling: no Redness:no Fevers: no  Relevant past medical, surgical, family and social history reviewed and updated as indicated. Interim medical history since our last visit reviewed. Allergies and medications reviewed and updated.  Review of Systems  Constitutional: Negative.   Respiratory: Negative.   Cardiovascular: Negative.   Gastrointestinal: Negative.   Musculoskeletal: Positive for arthralgias, gait problem and myalgias. Negative for back pain, joint swelling, neck pain and neck stiffness.  Skin: Negative.   Neurological: Positive for weakness. Negative for dizziness, tremors, seizures, syncope, facial asymmetry, speech difficulty, light-headedness, numbness and headaches.  Psychiatric/Behavioral: Negative.     Per HPI unless specifically indicated above     Objective:    BP 133/81   Pulse 89   Temp 98.1 F (36.7 C)    Wt 156 lb 4 oz (70.9 kg)   LMP 07/31/1981 (Approximate)   SpO2 98%   BMI 27.68 kg/m   Wt Readings from Last 3 Encounters:  07/07/19 156 lb 4 oz (70.9 kg)  04/17/19 146 lb (66.2 kg)  03/04/19 147 lb (66.7 kg)    Physical Exam Vitals signs and nursing note reviewed.  Constitutional:      General: She is not in acute distress.    Appearance: Normal appearance. She is not ill-appearing, toxic-appearing or diaphoretic.  HENT:     Head: Normocephalic and atraumatic.     Right Ear: External ear normal.     Left Ear: External ear normal.     Nose: Nose normal.     Mouth/Throat:     Mouth: Mucous membranes are moist.     Pharynx: Oropharynx is clear.  Eyes:     General: No scleral icterus.       Right eye: No discharge.        Left eye: No discharge.     Extraocular Movements: Extraocular movements intact.     Conjunctiva/sclera: Conjunctivae normal.     Pupils: Pupils are equal, round, and reactive to light.  Neck:     Musculoskeletal: Normal range of motion and neck supple.  Cardiovascular:     Rate and Rhythm: Normal rate and regular rhythm.     Pulses: Normal pulses.     Heart sounds: Normal heart sounds. No murmur. No friction rub. No gallop.   Pulmonary:     Effort: Pulmonary effort  is normal. No respiratory distress.     Breath sounds: Normal breath sounds. No stridor. No wheezing, rhonchi or rales.  Chest:     Chest wall: No tenderness.  Musculoskeletal:     Comments: Hip Exam: Left     Tenderness to palpation:      Greater trochanter: yes      Anterior superior iliac spine: no     Anterior hip: no     Iliac crest: no     Iliac tubercle: no     Pubic tubercle: no     SI joint: yes      Range of Motion:     Flexion: Normal    Extension: Normal    Abduction: Decreased    Adduction: Normal    Internal rotation: Normal    External rotation: Decreased     Muscle Mack:  5/5 bilaterally     Special Tests:    Ober test: negative    FABER test:positive    Skin:    General: Skin is warm and dry.     Capillary Refill: Capillary refill takes less than 2 seconds.     Coloration: Skin is not jaundiced or pale.     Findings: No bruising, erythema, lesion or rash.  Neurological:     General: No focal deficit present.     Mental Status: She is alert and oriented to person, place, and time. Mental status is at baseline.  Psychiatric:        Mood and Affect: Mood normal.        Behavior: Behavior normal.        Thought Content: Thought content normal.        Judgment: Judgment normal.     Results for orders placed or performed in visit on 06/18/19  HM MAMMOGRAPHY  Result Value Ref Range   HM Mammogram 0-4 Bi-Rad 0-4 Bi-Rad, Self Reported Normal      Assessment & Plan:   Problem List Items Addressed This Visit      Musculoskeletal and Integument   Bursitis of left hip    Trochanteric bursae injected today with good results. Call with any concerns.        Other Visit Diagnoses    Arthralgia of hip, unspecified laterality    -  Primary   +FABER- will obtain x-ray of her hip. Call with any concerns. Exercises given. Continue to monitor.       Procedure: Left Trochanteric Bursitis Steroid Injection        Diagnosis:   ICD-10-CM   1. Arthralgia of hip, unspecified laterality  M25.559    +FABER- will obtain x-ray of her hip. Call with any concerns. Exercises given. Continue to monitor.   2. Trochanteric bursitis of left hip  M70.62     Physician: MPJ Consent:  Risks, benefits, and alternative treatments discussed and all questions were answered.  Patient elected to proceed and verbal consent obtained.  Description: Area prepped and draped using  semi-sterile technique.  After identifying area of maximal tenderness, A mixture of 5cc 1% lidocaine and 1cc Kenalog 40 was injected.  A bandage was then placed over the injection site. Complications: none Post Procedure Instructions: Wound care instructions discussed and patient was  instructed to keep area clean and dry.  Signs and symptoms of infection discussed, patient agrees to contact the office ASAP should they occur.  Follow Up: Return if symptoms worsen or fail to improve.   Follow up plan: Return if symptoms worsen or  fail to improve.

## 2019-07-07 ENCOUNTER — Ambulatory Visit (INDEPENDENT_AMBULATORY_CARE_PROVIDER_SITE_OTHER): Payer: Medicare Other | Admitting: Family Medicine

## 2019-07-07 ENCOUNTER — Other Ambulatory Visit: Payer: Self-pay

## 2019-07-07 ENCOUNTER — Encounter: Payer: Self-pay | Admitting: Family Medicine

## 2019-07-07 VITALS — BP 133/81 | HR 89 | Temp 98.1°F | Wt 156.2 lb

## 2019-07-07 DIAGNOSIS — M25559 Pain in unspecified hip: Secondary | ICD-10-CM | POA: Diagnosis not present

## 2019-07-07 DIAGNOSIS — M25552 Pain in left hip: Secondary | ICD-10-CM | POA: Diagnosis not present

## 2019-07-07 DIAGNOSIS — M7062 Trochanteric bursitis, left hip: Secondary | ICD-10-CM | POA: Diagnosis not present

## 2019-07-07 NOTE — Patient Instructions (Signed)
Hip Bursitis Rehab Ask your health care provider which exercises are safe for you. Do exercises exactly as told by your health care provider and adjust them as directed. It is normal to feel mild stretching, pulling, tightness, or discomfort as you do these exercises. Stop right away if you feel sudden pain or your pain gets worse. Do not begin these exercises until told by your health care provider. Stretching exercise This exercise warms up your muscles and joints and improves the movement and flexibility of your hip. This exercise also helps to relieve pain and stiffness. Iliotibial band stretch An iliotibial band is a strong band of muscle tissue that runs from the outer side of your hip to the outer side of your thigh and knee. 1. Lie on your side with your left / right leg in the top position. 2. Bend your left / right knee and grab your ankle. Stretch out your bottom arm to help you balance. 3. Slowly bring your knee back so your thigh is behind your body. 4. Slowly lower your knee toward the floor until you feel a gentle stretch on the outside of your left / right thigh. If you do not feel a stretch and your knee will not fall farther, place the heel of your other foot on top of your knee and pull your knee down toward the floor with your foot. 5. Hold this position for __________ seconds. 6. Slowly return to the starting position. Repeat __________ times. Complete this exercise __________ times a day. Strengthening exercises These exercises build strength and endurance in your hip and pelvis. Endurance is the ability to use your muscles for a long time, even after they get tired. Bridge This exercise strengthens the muscles that move your thigh backward (hip extensors). 1. Lie on your back on a firm surface with your knees bent and your feet flat on the floor. 2. Tighten your buttocks muscles and lift your buttocks off the floor until your trunk is level with your thighs. ? Do not arch  your back. ? You should feel the muscles working in your buttocks and the back of your thighs. If you do not feel these muscles, slide your feet 1-2 inches (2.5-5 cm) farther away from your buttocks. ? If this exercise is too easy, try doing it with your arms crossed over your chest. 3. Hold this position for __________ seconds. 4. Slowly lower your hips to the starting position. 5. Let your muscles relax completely after each repetition. Repeat __________ times. Complete this exercise __________ times a day. Squats This exercise strengthens the muscles in front of your thigh and knee (quadriceps). 1. Stand in front of a table, with your feet and knees pointing straight ahead. You may rest your hands on the table for balance but not for support. 2. Slowly bend your knees and lower your hips like you are going to sit in a chair. ? Keep your weight over your heels, not over your toes. ? Keep your lower legs upright so they are parallel with the table legs. ? Do not let your hips go lower than your knees. ? Do not bend lower than told by your health care provider. ? If your hip pain increases, do not bend as low. 3. Hold the squat position for __________ seconds. 4. Slowly push with your legs to return to standing. Do not use your hands to pull yourself to standing. Repeat __________ times. Complete this exercise __________ times a day. Hip hike 1. Stand   sideways on a bottom step. Stand on your left / right leg with your other foot unsupported next to the step. You can hold on to the railing or wall for balance if needed. 2. Keep your knees straight and your torso square. Then lift your left / right hip up toward the ceiling. 3. Hold this position for __________ seconds. 4. Slowly let your left / right hip lower toward the floor, past the starting position. Your foot should get closer to the floor. Do not lean or bend your knees. Repeat __________ times. Complete this exercise __________ times a  day. Single leg stand 1. Without shoes, stand near a railing or in a doorway. You may hold on to the railing or door frame as needed for balance. 2. Squeeze your left / right buttock muscles, then lift up your other foot. ? Do not let your left / right hip push out to the side. ? It is helpful to stand in front of a mirror for this exercise so you can watch your hip. 3. Hold this position for __________ seconds. Repeat __________ times. Complete this exercise __________ times a day. This information is not intended to replace advice given to you by your health care provider. Make sure you discuss any questions you have with your health care provider. Document Released: 01/11/2005 Document Revised: 03/31/2019 Document Reviewed: 03/31/2019 Elsevier Patient Education  [employer].  Hip Exercises Ask your health care provider which exercises are safe for you. Do exercises exactly as told by your health care provider and adjust them as directed. It is normal to feel mild stretching, pulling, tightness, or discomfort as you do these exercises. Stop right away if you feel sudden pain or your pain gets worse. Do not begin these exercises until told by your health care provider. Stretching and range-of-motion exercises These exercises warm up your muscles and joints and improve the movement and flexibility of your hip. These exercises also help to relieve pain, numbness, and tingling. You may be asked to limit your range of motion if you had a hip replacement. Talk to your health care provider about these restrictions. Hamstrings, supine  1. Lie on your back (supine position). 2. Loop a belt or towel over the ball of your left / right foot. The ball of your foot is on the walking surface, right under your toes. 3. Straighten your left / right knee and slowly pull on the belt or towel to raise your leg until you feel a gentle stretch behind your knee (hamstring). ? Do not let your knee bend while  you do this. ? Keep your other leg flat on the floor. 4. Hold this position for __________ seconds. 5. Slowly return your leg to the starting position. Repeat __________ times. Complete this exercise __________ times a day. Hip rotation  6. Lie on your back on a firm surface. 7. With your left / right hand, gently pull your left / right knee toward the shoulder that is on the same side of the body. Stop when your knee is pointing toward the ceiling. 8. Hold your left / right ankle with your other hand. 9. Keeping your knee steady, gently pull your left / right ankle toward your other shoulder until you feel a stretch in your buttocks. ? Keep your hips and shoulders firmly planted while you do this stretch. 10. Hold this position for __________ seconds. Repeat __________ times. Complete this exercise __________ times a day. Seated stretch This exercise is sometimes  called hamstrings and adductors stretch. 1. Sit on the floor with your legs stretched wide. Keep your knees straight during this exercise. 2. Keeping your head and back in a straight line, bend at your waist to reach for your left foot (position A). You should feel a stretch in your right inner thigh (adductors). 3. Hold this position for __________ seconds. Then slowly return to the upright position. 4. Keeping your head and back in a straight line, bend at your waist to reach forward (position B). You should feel a stretch behind both of your thighs and knees (hamstrings). 5. Hold this position for __________ seconds. Then slowly return to the upright position. 6. Keeping your head and back in a straight line, bend at your waist to reach for your right foot (position C). You should feel a stretch in your left inner thigh (adductors). 7. Hold this position for __________ seconds. Then slowly return to the upright position. Repeat __________ times. Complete this exercise __________ times a day. Lunge This exercise stretches the  muscles of the hip (hip flexors). 1. Place your left / right knee on the floor and bend your other knee so that is directly over your ankle. You should be half-kneeling. 2. Keep good posture with your head over your shoulders. 3. Tighten your buttocks to point your tailbone downward. This will prevent your back from arching too much. 4. You should feel a gentle stretch in the front of your left / right thigh and hip. If you do not feel a stretch, slide your other foot forward slightly and then slowly lunge forward with your chest up until your knee once again lines up over your ankle. ? Make sure your tailbone continues to point downward. 5. Hold this position for __________ seconds. 6. Slowly return to the starting position. Repeat __________ times. Complete this exercise __________ times a day. Strengthening exercises These exercises build strength and endurance in your hip. Endurance is the ability to use your muscles for a long time, even after they get tired. Bridge This exercise strengthens the muscles of your hip (hip extensors). 4. Lie on your back on a firm surface with your knees bent and your feet flat on the floor. 5. Tighten your buttocks muscles and lift your bottom off the floor until the trunk of your body and your hips are level with your thighs. ? Do not arch your back. ? You should feel the muscles working in your buttocks and the back of your thighs. If you do not feel these muscles, slide your feet 1-2 inches (2.5-5 cm) farther away from your buttocks. 6. Hold this position for __________ seconds. 7. Slowly lower your hips to the starting position. 8. Let your muscles relax completely between repetitions. Repeat __________ times. Complete this exercise __________ times a day. Straight leg raises, side-lying This exercise strengthens the muscles that move the hip joint away from the center of the body (hip abductors). 1. Lie on your side with your left / right leg in the  top position. Lie so your head, shoulder, hip, and knee line up. You may bend your bottom knee slightly to help you balance. 2. Roll your hips slightly forward, so your hips are stacked directly over each other and your left / right knee is facing forward. 3. Leading with your heel, lift your top leg 4-6 inches (10-15 cm). You should feel the muscles in your top hip lifting. ? Do not let your foot drift forward. ? Do not let  your knee roll toward the ceiling. 4. Hold this position for __________ seconds. 5. Slowly return to the starting position. 6. Let your muscles relax completely between repetitions. Repeat __________ times. Complete this exercise __________ times a day. Straight leg raises, side-lying This exercise strengthen the muscles that move the hip joint toward the center of the body (hip adductors). 1. Lie on your side with your left / right leg in the bottom position. Lie so your head, shoulder, hip, and knee line up. You may place your upper foot in front to help you balance. 2. Roll your hips slightly forward, so your hips are stacked directly over each other and your left / right knee is facing forward. 3. Tense the muscles in your inner thigh and lift your bottom leg 4-6 inches (10-15 cm). 4. Hold this position for __________ seconds. 5. Slowly return to the starting position. 6. Let your muscles relax completely between repetitions. Repeat __________ times. Complete this exercise __________ times a day. Straight leg raises, supine This exercise strengthens the muscles in the front of your thigh (quadriceps). 1. Lie on your back (supine position) with your left / right leg extended and your other knee bent. 2. Tense the muscles in the front of your left / right thigh. You should see your kneecap slide up or see increased dimpling just above your knee. 3. Keep these muscles tight as you raise your leg 4-6 inches (10-15 cm) off the floor. Do not let your knee bend. 4. Hold this  position for __________ seconds. 5. Keep these muscles tense as you lower your leg. 6. Relax the muscles slowly and completely between repetitions. Repeat __________ times. Complete this exercise __________ times a day. Hip abductors, standing This exercise strengthens the muscles that move the leg and hip joint away from the center of the body (hip abductors). 1. Tie one end of a rubber exercise band or tubing to a secure surface, such as a chair, table, or pole. 2. Loop the other end of the band or tubing around your left / right ankle. 3. Keeping your ankle with the band or tubing directly opposite the secured end, step away until there is tension in the tubing or band. Hold on to a chair, table, or pole as needed for balance. 4. Lift your left / right leg out to your side. While you do this: ? Keep your back upright. ? Keep your shoulders over your hips. ? Keep your toes pointing forward. ? Make sure to use your hip muscles to slowly lift your leg. Do not tip your body or forcefully lift your leg. 5. Hold this position for __________ seconds. 6. Slowly return to the starting position. Repeat __________ times. Complete this exercise __________ times a day. Squats This exercise strengthens the muscles in the front of your thigh (quadriceps). 1. Stand in a door frame so your feet and knees are in line with the frame. You may place your hands on the frame for balance. 2. Slowly bend your knees and lower your hips like you are going to sit in a chair. ? Keep your lower legs in a straight-up-and-down position. ? Do not let your hips go lower than your knees. ? Do not bend your knees lower than told by your health care provider. ? If your hip pain increases, do not bend as low. 3. Hold this position for ___________ seconds. 4. Slowly push with your legs to return to standing. Do not use your hands to pull yourself  to standing. Repeat __________ times. Complete this exercise __________ times a  day. This information is not intended to replace advice given to you by your health care provider. Make sure you discuss any questions you have with your health care provider. Document Released: 12/22/2005 Document Revised: 10/15/2018 Document Reviewed: 10/15/2018 Elsevier Patient Education  2020 [employer].

## 2019-07-07 NOTE — Assessment & Plan Note (Signed)
Trochanteric bursae injected today with good results. Call with any concerns.

## 2019-07-11 ENCOUNTER — Telehealth: Payer: Self-pay | Admitting: Family Medicine

## 2019-07-11 NOTE — Telephone Encounter (Signed)
Please let her know that her hip x-ray was normal. Thanks!

## 2019-07-11 NOTE — Telephone Encounter (Signed)
Patient notified

## 2019-09-26 ENCOUNTER — Ambulatory Visit (INDEPENDENT_AMBULATORY_CARE_PROVIDER_SITE_OTHER): Payer: Medicare Other

## 2019-09-26 ENCOUNTER — Other Ambulatory Visit: Payer: Self-pay

## 2019-09-26 DIAGNOSIS — Z23 Encounter for immunization: Secondary | ICD-10-CM

## 2019-10-21 ENCOUNTER — Ambulatory Visit (INDEPENDENT_AMBULATORY_CARE_PROVIDER_SITE_OTHER): Payer: Medicare Other | Admitting: Family Medicine

## 2019-10-21 ENCOUNTER — Ambulatory Visit: Payer: Medicare Other | Admitting: Nurse Practitioner

## 2019-10-21 ENCOUNTER — Other Ambulatory Visit: Payer: Self-pay

## 2019-10-21 ENCOUNTER — Encounter: Payer: Self-pay | Admitting: Family Medicine

## 2019-10-21 VITALS — BP 110/69 | HR 87 | Temp 97.5°F

## 2019-10-21 DIAGNOSIS — I10 Essential (primary) hypertension: Secondary | ICD-10-CM

## 2019-10-21 DIAGNOSIS — J45909 Unspecified asthma, uncomplicated: Secondary | ICD-10-CM | POA: Diagnosis not present

## 2019-10-21 DIAGNOSIS — G5600 Carpal tunnel syndrome, unspecified upper limb: Secondary | ICD-10-CM | POA: Diagnosis not present

## 2019-10-21 DIAGNOSIS — E782 Mixed hyperlipidemia: Secondary | ICD-10-CM

## 2019-10-21 DIAGNOSIS — R202 Paresthesia of skin: Secondary | ICD-10-CM | POA: Diagnosis not present

## 2019-10-21 DIAGNOSIS — K219 Gastro-esophageal reflux disease without esophagitis: Secondary | ICD-10-CM

## 2019-10-21 DIAGNOSIS — E559 Vitamin D deficiency, unspecified: Secondary | ICD-10-CM

## 2019-10-21 MED ORDER — MONTELUKAST SODIUM 10 MG PO TABS: 10.0000 mg | ORAL_TABLET | Freq: Every day | ORAL | 1 refills | Status: DC

## 2019-10-21 MED ORDER — ALBUTEROL SULFATE HFA 108 (90 BASE) MCG/ACT IN AERS: 2.0000 | INHALATION_SPRAY | Freq: Four times a day (QID) | RESPIRATORY_TRACT | 3 refills | Status: DC | PRN

## 2019-10-21 MED ORDER — HYDROCHLOROTHIAZIDE 25 MG PO TABS: 12.5000 mg | ORAL_TABLET | Freq: Every day | ORAL | 1 refills | Status: DC

## 2019-10-21 MED ORDER — FLUTICASONE-SALMETEROL 500-50 MCG/DOSE IN AEPB: 1.0000 | INHALATION_SPRAY | Freq: Two times a day (BID) | RESPIRATORY_TRACT | 1 refills | Status: DC

## 2019-10-21 MED ORDER — ESTRADIOL 2 MG PO TABS: 2.0000 mg | ORAL_TABLET | Freq: Every day | ORAL | 3 refills | Status: DC

## 2019-10-21 MED ORDER — ATORVASTATIN CALCIUM 20 MG PO TABS: 20.0000 mg | ORAL_TABLET | Freq: Every day | ORAL | 1 refills | Status: DC

## 2019-10-21 MED ORDER — FLUTICASONE PROPIONATE 50 MCG/ACT NA SUSP: 2.0000 | Freq: Every day | NASAL | 3 refills | Status: DC

## 2019-10-21 NOTE — Progress Notes (Signed)
BP 110/69   Pulse 87   Temp (!) 97.5 F (36.4 C)   LMP 07/31/1981 (Approximate)    Subjective:    Patient ID: Nancy Mack, female    DOB: [birth year], 73 y.o.   MRN: [id number]  HPI: Nancy Mack is a 73 y.o. female  Chief Complaint  Patient presents with  . Hypertension   HYPERTENSION / Str[place] Satisfied with current treatment? yes Duration of hypertension: chronic BP monitoring frequency: a few times a month BP medication side effects: no Past BP meds: HCTZ Duration of hyperlipidemia: chronic Cholesterol medication side effects: no Cholesterol supplements: none Past cholesterol medications: atorvastatin Medication compliance: excellent compliance Aspirin: yes Recent stressors: no Recurrent headaches: no Visual changes: no Palpitations: no Dyspnea: no Chest pain: no Lower extremity edema: no Dizzy/lightheaded: no  NUMBNESS Duration: couple of months Onset: gradual Location: both hands and feets Bilateral: yes Symmetric: yes Decreased sensation: no  Weakness: no Pain: no Quality:  Numbness and tingling Severity: moderate  Frequency: intermittent Trauma: no Recent illness: no Diabetes: no Thyroid disease: no  HIV: no  Alcoholism: no  Spinal cord injury: no Status: stable  ASTHMA Asthma status: controlled Satisfied with current treatment?: yes Albuterol/rescue inhaler frequency: rarely Dyspnea frequency: rarely Wheezing frequency: rarely Cough frequency: rarely Nocturnal symptom frequency: never Limitation of activity: no Current upper respiratory symptoms: no Aerochamber/spacer use: no Pneumovax: Up to Date Influenza: Up to Date  GERD- No problem since switching to lactaid milk  Relevant past medical, surgical, family and social history reviewed and updated as indicated. Interim medical history since our last visit reviewed. Allergies and medications reviewed and updated.  Review of Systems  Constitutional: Negative.    Respiratory: Negative.   Cardiovascular: Negative.   Gastrointestinal: Negative.   Musculoskeletal: Negative.   Skin: Negative.   Neurological: Positive for numbness and headaches. Negative for dizziness, tremors, seizures, syncope, facial asymmetry, speech difficulty, weakness and light-headedness.  Hematological: Negative.   Psychiatric/Behavioral: Negative.     Per HPI unless specifically indicated above     Objective:    BP 110/69   Pulse 87   Temp (!) 97.5 F (36.4 C)   LMP 07/31/1981 (Approximate)   Wt Readings from Last 3 Encounters:  07/07/19 156 lb 4 oz (70.9 kg)  04/17/19 146 lb (66.2 kg)  03/04/19 147 lb (66.7 kg)    Physical Exam Vitals signs and nursing note reviewed.  Constitutional:      General: She is not in acute distress.    Appearance: Normal appearance. She is not ill-appearing, toxic-appearing or diaphoretic.  HENT:     Head: Normocephalic and atraumatic.     Right Ear: External ear normal.     Left Ear: External ear normal.     Nose: Nose normal.     Mouth/Throat:     Mouth: Mucous membranes are moist.     Pharynx: Oropharynx is clear.  Eyes:     General: No scleral icterus.       Right eye: No discharge.        Left eye: No discharge.     Extraocular Movements: Extraocular movements intact.     Conjunctiva/sclera: Conjunctivae normal.     Pupils: Pupils are equal, round, and reactive to light.  Neck:     Musculoskeletal: Normal range of motion and neck supple.  Cardiovascular:     Rate and Rhythm: Normal rate and regular rhythm.     Pulses: Normal pulses.     Heart sounds: Normal heart  sounds. No murmur. No friction rub. No gallop.   Pulmonary:     Effort: Pulmonary effort is normal. No respiratory distress.     Breath sounds: Normal breath sounds. No stridor. No wheezing, rhonchi or rales.  Chest:     Chest wall: No tenderness.  Musculoskeletal: Normal range of motion.  Skin:    General: Skin is warm and dry.     Capillary Refill:  Capillary refill takes less than 2 seconds.     Coloration: Skin is not jaundiced or pale.     Findings: No bruising, erythema, lesion or rash.  Neurological:     General: No focal deficit present.     Mental Status: She is alert and oriented to person, place, and time. Mental status is at baseline.  Psychiatric:        Mood and Affect: Mood normal.        Behavior: Behavior normal.        Thought Content: Thought content normal.        Judgment: Judgment normal.     Results for orders placed or performed in visit on 06/18/19  HM MAMMOGRAPHY  Result Value Ref Range   HM Mammogram 0-4 Bi-Rad 0-4 Bi-Rad, Self Reported Normal      Assessment & Plan:   Problem List Items Addressed This Visit      Cardiovascular and Mediastinum   Benign essential HTN - Primary    Under good control on current regimen. Continue current regimen. Continue to monitor. Call with any concerns. Refills given. Will get labs ASAP.       Relevant Medications   hydrochlorothiazide (HYDRODIURIL) 25 MG tablet   atorvastatin (LIPITOR) 20 MG tablet   Other Relevant Orders   Comprehensive metabolic panel   TSH   UA/M w/rflx Culture, Routine   Microalbumin, Urine Waived     Respiratory   Asthma    Under good control on current regimen. Continue current regimen. Continue to monitor. Call with any concerns. Refills given. Will get labs ASAP.      Relevant Medications   montelukast (SINGULAIR) 10 MG tablet   Fluticasone-Salmeterol (ADVAIR DISKUS) 500-50 MCG/DOSE AEPB   albuterol (PROAIR HFA) 108 (90 Base) MCG/ACT inhaler   Other Relevant Orders   Comprehensive metabolic panel   TSH   UA/M w/rflx Culture, Routine     Digestive   GERD (gastroesophageal reflux disease)    Resolved. Continue lactaid. Call with any concerns.       Relevant Orders   CBC with Differential/Platelet   Comprehensive metabolic panel   TSH   UA/M w/rflx Culture, Routine     Nervous and Auditory   Carpal tunnel syndrome     Possibly acting up. Will start stretches. Call with any concerns. Continue to monitor.         Other   Hyperlipidemia    Under good control on current regimen. Continue current regimen. Continue to monitor. Call with any concerns. Refills given. Will get labs ASAP.       Relevant Medications   hydrochlorothiazide (HYDRODIURIL) 25 MG tablet   atorvastatin (LIPITOR) 20 MG tablet   Other Relevant Orders   Comprehensive metabolic panel   Lipid Panel w/o Chol/HDL Ratio   TSH   UA/M w/rflx Culture, Routine   Vitamin D deficiency    Will check labs. Await results. Call with any concerns.       Relevant Orders   Comprehensive metabolic panel   TSH   UA/M w/rflx Culture, Routine  VITAMIN D 25 Hydroxy (Vit-D Deficiency, Fractures)    Other Visit Diagnoses    Paresthesia       Will check labs and start exercises. Call if not getting better or getting worse.    Relevant Orders   B12 and Folate Panel       Follow up plan: Return in about 6 months (around 04/19/2020) for Physical.   . This visit was completed via [employer] due to the restrictions of the COVID-19 pandemic. All issues as above were discussed and addressed. Physical exam was done as above through visual confirmation on [employer]. If it was felt that the patient should be evaluated in the office, they were directed there. The patient verbally consented to this visit. . Location of the patient: home . Location of the provider: home . Those involved with this call:  . Provider: Park Liter, DO . CMA: Tiffany Reel, CMA . Front Desk/Registration: Don Perking  . Time spent on call: 25 minutes with patient face to face via video conference. More than 50% of this time was spent in counseling and coordination of care. 40 minutes total spent in review of patient's record and preparation of their chart.

## 2019-10-21 NOTE — Assessment & Plan Note (Signed)
Possibly acting up. Will start stretches. Call with any concerns. Continue to monitor.

## 2019-10-21 NOTE — Assessment & Plan Note (Signed)
Resolved. Continue lactaid. Call with any concerns.

## 2019-10-21 NOTE — Assessment & Plan Note (Signed)
Will check labs. Await results. Call with any concerns.  

## 2019-10-21 NOTE — Assessment & Plan Note (Signed)
Under good control on current regimen. Continue current regimen. Continue to monitor. Call with any concerns. Refills given. Will get labs ASAP 

## 2019-10-21 NOTE — Patient Instructions (Signed)
Wrist Sprain Rehab Ask your health care provider which exercises are safe for you. Do exercises exactly as told by your health care provider and adjust them as directed. It is normal to feel mild stretching, pulling, tightness, or discomfort as you do these exercises. Stop right away if you feel sudden pain or your pain gets worse. Do not begin these exercises until told by your health care provider. Stretching and range-of-motion exercises These exercises warm up your muscles and joints and improve the movement and flexibility of your wrist. These exercises also help to relieve pain, numbness, and tingling. Wrist flexion range of motion 1. Bend your left / right elbow to a 90-degree angle (right angle) with your palm facing the floor. 2. Bend your wrist forward so your fingers point toward the floor (flexion). 3. Hold this position for __________ seconds. 4. Slowly return to the starting position. Repeat __________ times. Complete this exercise __________ times a day. Wrist extension range of motion 1. Bend your left / right elbow to a 90-degree angle (right angle) with your palm facing the floor. 2. Bend your wrist backward so your fingers point toward the ceiling (extension). 3. Hold this position for __________ seconds. 4. Slowly return to the starting position. Repeat __________ times. Complete this exercise __________ times a day. Ulnar deviation range of motion 1. Bend your left / right elbow to a 90-degree angle (right angle), and rest your forearm on a table with your palm facing down. 2. Keeping your hand flat on the table, bend your left / right wrist toward your small finger (pinkie). This is ulnar deviation. 3. Hold this position for __________ seconds. 4. Slowly return your palm to the starting position. Repeat __________ times. Complete this exercise __________ times a day. Radial deviation range of motion 1. Bend your left / right elbow to a 90-degree angle (right angle), and  rest your forearm on a table with your palm facing down. 2. Keeping your hand flat on the table, bend your left / right wrist toward your thumb. This is radial deviation. 3. Hold this position for __________ seconds. 4. Slowly return your palm to the starting position. Repeat __________ times. Complete this exercise __________ times a day. Dart-thrower's motion This exercise combines all four wrist motions-wrist flexion, wrist extension, ulnar deviation, and radial deviation-into one exercise. The motion is similar to throwing a dart. 1. Sit and rest your left / right elbow on a table. Start with your wrist straight. Keep your fingers relaxed during the exercise. 2. In the same motion, bend your wrist backward (extension) and toward your thumb (radial deviation). Move slowly and go as far as you can. 3. Then, in the same motion, bend your wrist forward (flexion) and toward your small (pinkie) finger (ulnar deviation). Move slowly and go as far as you can. 4. Slowly return to the starting position. Repeat __________ times. Complete this exercise __________ times a day. Wrist flexion stretch  1. Extend your left / right arm in front of you, and turn your palm down toward the floor. ? If told by your health care provider, bend your left / right elbow to a 90-degree angle (right angle) at your side. 2. Using your uninjured hand, gently press over the back of your left / right hand to bend your wrist and fingers toward the floor (flexion). Go as far as you can to feel a stretch without causing pain. 3. Hold this position for __________ seconds. 4. Slowly return to the starting position.  Repeat __________ times. Complete this exercise __________ times a day. °Wrist extension stretch ° °1. Extend your left / right arm in front of you and turn your palm up toward the ceiling. °? If told by your health care provider, bend your left / right elbow to a 90-degree angle (right angle) at your side. °2. Using  your uninjured hand, gently press over the palm of your left / right hand to bend your wrist and fingers toward the floor (extension). Go as far as you can to feel a stretch without causing pain. °3. Hold this position for __________ seconds. °4. Slowly return to the starting position. °Repeat __________ times. Complete this exercise __________ times a day. °Ulnar deviation stretch °1. Bend your left / right elbow to a 90-degree angle (right angle). Rest your forearm, wrist, and hand on a table with your palm facing the floor. °2. Place your uninjured hand over the top of your left / right hand to keep it from moving during the exercise. °3. Gently move your left / right elbow away from your body. This will turn your wrist toward your small finger (pinkie). This is ulnar deviation. °4. Hold this position for __________ seconds. °5. Slowly return to the starting position. °Repeat __________ times. Complete this exercise __________ times a day. °Radial deviation stretch °1. Bend your left / right elbow to a 90-degree angle (right angle). Rest your forearm, wrist, and hand on a table with your palm facing the floor. °2. Place your uninjured hand over the top of your left / right hand to keep it from moving during the exercise. °3. Gently move your left / right elbow toward your body. This will turn your wrist toward your thumb (radial deviation). °4. Hold this position for __________ seconds. °5. Slowly return to the starting position. °Repeat __________ times. Complete this exercise __________ times a day. °Strengthening exercises °These exercises build strength and endurance in your wrist. Endurance is the ability to use your muscles for a long time, even after they get tired. °Isometric wrist flexion °1. Bend your left / right elbow to a 90-degree angle (right angle). Rest your forearm, wrist, and hand on a table with your palm facing the floor. °2. Without moving any joints (isometric), tighten your muscles as if  you are trying to bend (flex) your wrist. °? Start with partial effort and increase your effort as tolerated. °3. Hold this position for __________ seconds. Then relax your muscles. °Repeat __________ times. Complete this exercise __________ times a day. °Isometric wrist extension °1. Bend your left / right elbow to a 90-degree angle (right angle). Rest your forearm, wrist, and hand on a table with your palm facing the floor. Place your unaffected hand over the top of the left / right hand. °2. Without moving any joints (isometric), tighten your muscles to lift your left / right hand off the table (extension). Use the unaffected hand to keep the left / right hand on the table. °? Start with partial effort and increase your effort as tolerated. °3. Hold this position for __________ seconds. Then relax your muscles. °Repeat __________ times. Complete this exercise __________ times a day. °Isometric ulnar deviation °1. Bend your left / right elbow to a 90-degree angle (right angle). Rest your forearm, wrist, and hand on a table with your thumb facing the ceiling (neutral position). Make a light fist with your left / right fingers. °2. Without moving any joints (isometric), tighten your muscles to press the side of your   left / right fist into the tabletop (ulnar deviation). ? Start with partial effort and increase your effort as tolerated. 3. Hold this position for __________ seconds. Then relax your muscles. Repeat __________ times. Complete this exercise __________ times a day. Isometric radial deviation 1. Bend your left / right elbow to a 90-degree angle (right angle). Rest your forearm, wrist, and hand on a table with your thumb facing the ceiling (neutral position). 2. Make a light fist with your left / right fingers. Place your unaffected hand over the top of your fist. 3. Without moving any joints (isometric), tighten your muscles to lift your left / right fist off the table (radial deviation). Use the  unaffected hand to keep the left / right hand on the table. ? Start with partial effort and increase your effort as tolerated. 4. Hold this position for __________ seconds. Then relax your muscles. Repeat __________ times. Complete this exercise __________ times a day. Wrist flexion  1. Sit with your left / right forearm supported on a table or other surface. Bend your elbow to a 90-degree angle (right angle), and rest your hand palm-up over the edge of the table. 2. Hold a __________ weight in your left / right hand. Or, hold an exercise band or tube in both hands, keeping your hands at the same level and hip distance apart. There should be a slight tension in the exercise band or tube. 3. Slowly curl your hand up toward the ceiling (flexion). 4. Hold this position for __________ seconds. 5. Slowly lower your hand back to the starting position. Repeat __________ times. Complete this exercise __________ times a day. Wrist extension  1. Sit with your left / right forearm supported on a table or other surface. Bend your elbow to a 90-degree angle (right angle), and rest your hand palm-down over the edge of the table. 2. Hold a __________ weight in your left / right hand. Or, hold an exercise band or tube in both hands, keeping your hands at the same level and hip distance apart. There should be a slight tension in the exercise band or tube. 3. Slowly curl your hand up toward the ceiling (extension). 4. Hold this position for __________ seconds. 5. Slowly lower your hand back to the starting position. Repeat __________ times. Complete this exercise __________ times a day. This information is not intended to replace advice given to you by your health care provider. Make sure you discuss any questions you have with your health care provider. Document Released: 12/04/2005 Document Revised: 03/24/2019 Document Reviewed: 02/24/2019 Elsevier Patient Education  [place].  Neck Exercises Ask  your health care provider which exercises are safe for you. Do exercises exactly as told by your health care provider and adjust them as directed. It is normal to feel mild stretching, pulling, tightness, or discomfort as you do these exercises. Stop right away if you feel sudden pain or your pain gets worse. Do not begin these exercises until told by your health care provider. Neck exercises can be important for many reasons. They can improve strength and maintain flexibility in your neck, which will help your upper back and prevent neck pain. Stretching exercises Rotation neck stretching  1. Sit in a chair or stand up. 2. Place your feet flat on the floor, shoulder width apart. 3. Slowly turn your head (rotate) to the right until a slight stretch is felt. Turn it all the way to the right so you can look over your right  shoulder. Do not tilt or tip your head. 4. Hold this position for 10-30 seconds. 5. Slowly turn your head (rotate) to the left until a slight stretch is felt. Turn it all the way to the left so you can look over your left shoulder. Do not tilt or tip your head. 6. Hold this position for 10-30 seconds. Repeat __________ times. Complete this exercise __________ times a day. Neck retraction 1. Sit in a sturdy chair or stand up. 2. Look straight ahead. Do not bend your neck. 3. Use your fingers to push your chin backward (retraction). Do not bend your neck for this movement. Continue to face straight ahead. If you are doing the exercise properly, you will feel a slight sensation in your throat and a stretch at the back of your neck. 4. Hold the stretch for 1-2 seconds. Repeat __________ times. Complete this exercise __________ times a day. Strengthening exercises Neck press 1. Lie on your back on a firm bed or on the floor with a pillow under your head. 2. Use your neck muscles to push your head down on the pillow and straighten your spine. 3. Hold the position as well as you can.  Keep your head facing up (in a neutral position) and your chin tucked. 4. Slowly count to 5 while holding this position. Repeat __________ times. Complete this exercise __________ times a day. Isometrics These are exercises in which you strengthen the muscles in your neck while keeping your neck still (isometrics). 1. Sit in a supportive chair and place your hand on your forehead. 2. Keep your head and face facing straight ahead. Do not flex or extend your neck while doing isometrics. 3. Push forward with your head and neck while pushing back with your hand. Hold for 10 seconds. 4. Do the sequence again, this time putting your hand against the back of your head. Use your head and neck to push backward against the hand pressure. 5. Finally, do the same exercise on either side of your head, pushing sideways against the pressure of your hand. Repeat __________ times. Complete this exercise __________ times a day. Prone head lifts 1. Lie face-down (prone position), resting on your elbows so that your chest and upper back are raised. 2. Start with your head facing downward, near your chest. Position your chin either on or near your chest. 3. Slowly lift your head upward. Lift until you are looking straight ahead. Then continue lifting your head as far back as you can comfortably stretch. 4. Hold your head up for 5 seconds. Then slowly lower it to your starting position. Repeat __________ times. Complete this exercise __________ times a day. Supine head lifts 1. Lie on your back (supine position), bending your knees to point to the ceiling and keeping your feet flat on the floor. 2. Lift your head slowly off the floor, raising your chin toward your chest. 3. Hold for 5 seconds. Repeat __________ times. Complete this exercise __________ times a day. Scapular retraction 1. Stand with your arms at your sides. Look straight ahead. 2. Slowly pull both shoulders (scapulae) backward and downward  (retraction) until you feel a stretch between your shoulder blades in your upper back. 3. Hold for 10-30 seconds. 4. Relax and repeat. Repeat __________ times. Complete this exercise __________ times a day. Contact a health care provider if:  Your neck pain or discomfort gets much worse when you do an exercise.  Your neck pain or discomfort does not improve within 2 hours after  you exercise. If you have any of these problems, stop exercising right away. Do not do the exercises again unless your health care provider says that you can. Get help right away if:  You develop sudden, severe neck pain. If this happens, stop exercising right away. Do not do the exercises again unless your health care provider says that you can. This information is not intended to replace advice given to you by your health care provider. Make sure you discuss any questions you have with your health care provider. Document Released: 11/15/2015 Document Revised: 10/02/2018 Document Reviewed: 10/02/2018 Elsevier Patient Education  2020 [employer].

## 2019-10-23 ENCOUNTER — Other Ambulatory Visit: Payer: Self-pay

## 2019-10-24 ENCOUNTER — Other Ambulatory Visit: Payer: Self-pay

## 2019-10-24 ENCOUNTER — Other Ambulatory Visit: Payer: Medicare Other

## 2019-10-24 DIAGNOSIS — E782 Mixed hyperlipidemia: Secondary | ICD-10-CM

## 2019-10-24 DIAGNOSIS — J45909 Unspecified asthma, uncomplicated: Secondary | ICD-10-CM

## 2019-10-24 DIAGNOSIS — K219 Gastro-esophageal reflux disease without esophagitis: Secondary | ICD-10-CM

## 2019-10-24 DIAGNOSIS — E559 Vitamin D deficiency, unspecified: Secondary | ICD-10-CM | POA: Diagnosis not present

## 2019-10-24 DIAGNOSIS — I10 Essential (primary) hypertension: Secondary | ICD-10-CM | POA: Diagnosis not present

## 2019-10-24 DIAGNOSIS — R202 Paresthesia of skin: Secondary | ICD-10-CM

## 2019-10-24 LAB — UA/M W/RFLX CULTURE, ROUTINE
Bilirubin, UA: NEGATIVE
Glucose, UA: NEGATIVE
Ketones, UA: NEGATIVE
Leukocytes,UA: NEGATIVE
Nitrite, UA: NEGATIVE
Protein,UA: NEGATIVE
RBC, UA: NEGATIVE
Specific Gravity, UA: 1.015 (ref 1.005–1.030)
Urobilinogen, Ur: 0.2 mg/dL (ref 0.2–1.0)
pH, UA: 7 (ref 5.0–7.5)

## 2019-10-24 LAB — MICROALBUMIN, URINE WAIVED
Creatinine, Urine Waived: 100 mg/dL (ref 10–300)
Microalb, Ur Waived: 10 mg/L (ref 0–19)
Microalb/Creat Ratio: <30 mg/g (ref <30)

## 2019-10-25 LAB — CBC WITH DIFFERENTIAL/PLATELET
Basophils Absolute: 0 10*3/uL (ref 0.0–0.2)
Basos: 1 %
EOS (ABSOLUTE): 0.3 10*3/uL (ref 0.0–0.4)
Eos: 5 %
Hematocrit: 38.7 % (ref 34.0–46.6)
Hemoglobin: 12.8 g/dL (ref 11.1–15.9)
Immature Grans (Abs): 0 10*3/uL (ref 0.0–0.1)
Immature Granulocytes: 0 %
Lymphocytes Absolute: 1.5 10*3/uL (ref 0.7–3.1)
Lymphs: 24 %
MCH: 26.8 pg (ref 26.6–33.0)
MCHC: 33.1 g/dL (ref 31.5–35.7)
MCV: 81 fL (ref 79–97)
Monocytes Absolute: 0.4 10*3/uL (ref 0.1–0.9)
Monocytes: 7 %
Neutrophils Absolute: 3.8 10*3/uL (ref 1.4–7.0)
Neutrophils: 63 %
Platelets: 355 10*3/uL (ref 150–450)
RBC: 4.77 x10E6/uL (ref 3.77–5.28)
RDW: 13.8 % (ref 11.7–15.4)
WBC: 6 10*3/uL (ref 3.4–10.8)

## 2019-10-25 LAB — COMPREHENSIVE METABOLIC PANEL
ALT: 7 IU/L (ref 0–32)
AST: 16 IU/L (ref 0–40)
Albumin/Globulin Ratio: 1.6 (ref 1.2–2.2)
Albumin: 4.1 g/dL (ref 3.7–4.7)
Alkaline Phosphatase: 85 IU/L (ref 39–117)
BUN/Creatinine Ratio: 12 (ref 12–28)
BUN: 13 mg/dL (ref 8–27)
Bilirubin Total: 0.3 mg/dL (ref 0.0–1.2)
CO2: 28 mmol/L (ref 20–29)
Calcium: 9.4 mg/dL (ref 8.7–10.3)
Chloride: 102 mmol/L (ref 96–106)
Creatinine, Ser: 1.07 mg/dL — ABNORMAL HIGH (ref 0.57–1.00)
GFR calc Af Amer: 60 mL/min/{1.73_m2} (ref >59)
GFR calc non Af Amer: 52 mL/min/{1.73_m2} — ABNORMAL LOW (ref >59)
Globulin, Total: 2.5 g/dL (ref 1.5–4.5)
Glucose: 82 mg/dL (ref 65–99)
Potassium: 4.3 mmol/L (ref 3.5–5.2)
Sodium: 141 mmol/L (ref 134–144)
Total Protein: 6.6 g/dL (ref 6.0–8.5)

## 2019-10-25 LAB — LIPID PANEL W/O CHOL/HDL RATIO
Cholesterol, Total: 192 mg/dL (ref 100–199)
HDL: 62 mg/dL (ref >39)
LDL Chol Calc (NIH): 107 mg/dL — ABNORMAL HIGH (ref 0–99)
Triglycerides: 133 mg/dL (ref 0–149)
VLDL Cholesterol Cal: 23 mg/dL (ref 5–40)

## 2019-10-25 LAB — VITAMIN D 25 HYDROXY (VIT D DEFICIENCY, FRACTURES): Vit D, 25-Hydroxy: 47.4 ng/mL (ref 30.0–100.0)

## 2019-10-25 LAB — TSH: TSH: 2.31 u[IU]/mL (ref 0.450–4.500)

## 2019-10-25 LAB — B12 AND FOLATE PANEL
Folate: 7.5 ng/mL (ref >3.0)
Vitamin B-12: 1623 pg/mL — ABNORMAL HIGH (ref 232–1245)

## 2019-10-28 ENCOUNTER — Encounter: Payer: Self-pay | Admitting: Family Medicine

## 2020-01-06 DIAGNOSIS — R9431 Abnormal electrocardiogram [ECG] [EKG]: Secondary | ICD-10-CM | POA: Insufficient documentation

## 2020-01-19 ENCOUNTER — Other Ambulatory Visit: Payer: Self-pay

## 2020-01-19 ENCOUNTER — Ambulatory Visit (INDEPENDENT_AMBULATORY_CARE_PROVIDER_SITE_OTHER): Payer: Medicare PPO | Admitting: Family Medicine

## 2020-01-19 ENCOUNTER — Encounter: Payer: Self-pay | Admitting: Family Medicine

## 2020-01-19 VITALS — BP 129/81 | HR 97 | Temp 98.5°F

## 2020-01-19 DIAGNOSIS — M67441 Ganglion, right hand: Secondary | ICD-10-CM | POA: Diagnosis not present

## 2020-01-19 MED ORDER — KETOROLAC TROMETHAMINE 60 MG/2ML IM SOLN
30.0000 mg | Freq: Once | INTRAMUSCULAR | Status: AC
  Administered 2020-01-19: 30 mg via INTRAMUSCULAR

## 2020-01-19 NOTE — Progress Notes (Signed)
BP 129/81   Pulse 97   Temp 98.5 F (36.9 C)   LMP 07/31/1981 (Approximate)   SpO2 97%    Subjective:    Patient ID: Nancy Mack, female    DOB: 27-Sep-1946, 74 y.o.   MRN: [id number]  HPI: Nancy Mack is a 74 y.o. female  Chief Complaint  Patient presents with  . Mass    right hand X 1 week, painful   Has had a lump in her R hand for about a week, feels like it's catching when she moves her middle finger. It's been hurting a lot- aching. Nothing is making it better or worse. Pain does not radiate. Not getting stuck. She is otherwise doing OK. Working with Dr. Nehemiah Massed for SOB- to have ECHO and stress test next week. No other concerns or complaints at this time.   Relevant past medical, surgical, family and social history reviewed and updated as indicated. Interim medical history since our last visit reviewed. Allergies and medications reviewed and updated.  Review of Systems  Constitutional: Negative.   Respiratory: Negative.   Cardiovascular: Negative.   Musculoskeletal: Positive for arthralgias and myalgias. Negative for back pain, gait problem, joint swelling, neck pain and neck stiffness.  Skin: Negative.   Neurological: Negative.   Psychiatric/Behavioral: Negative.     Per HPI unless specifically indicated above     Objective:    BP 129/81   Pulse 97   Temp 98.5 F (36.9 C)   LMP 07/31/1981 (Approximate)   SpO2 97%   Wt Readings from Last 3 Encounters:  07/07/19 156 lb 4 oz (70.9 kg)  04/17/19 146 lb (66.2 kg)  03/04/19 147 lb (66.7 kg)    Physical Exam Vitals and nursing note reviewed.  Constitutional:      General: She is not in acute distress.    Appearance: Normal appearance. She is not ill-appearing, toxic-appearing or diaphoretic.  HENT:     Head: Normocephalic and atraumatic.     Right Ear: External ear normal.     Left Ear: External ear normal.     Nose: Nose normal.     Mouth/Throat:     Mouth: Mucous membranes are moist.    Pharynx: Oropharynx is clear.  Eyes:     General: No scleral icterus.       Right eye: No discharge.        Left eye: No discharge.     Extraocular Movements: Extraocular movements intact.     Conjunctiva/sclera: Conjunctivae normal.     Pupils: Pupils are equal, round, and reactive to light.  Cardiovascular:     Rate and Rhythm: Normal rate and regular rhythm.     Pulses: Normal pulses.     Heart sounds: Normal heart sounds. No murmur. No friction rub. No gallop.   Pulmonary:     Effort: Pulmonary effort is normal. No respiratory distress.     Breath sounds: Normal breath sounds. No stridor. No wheezing, rhonchi or rales.  Chest:     Chest wall: No tenderness.  Musculoskeletal:        General: Normal range of motion.     Cervical back: Normal range of motion and neck supple.     Comments: Small nodule on flexor tendon of the middle finger on the R hand, tender to palpation, some catching of the middle finger with movement.   Skin:    General: Skin is warm and dry.     Capillary Refill: Capillary refill takes less  than 2 seconds.     Coloration: Skin is not jaundiced or pale.     Findings: No bruising, erythema, lesion or rash.  Neurological:     General: No focal deficit present.     Mental Status: She is alert and oriented to person, place, and time. Mental status is at baseline.  Psychiatric:        Mood and Affect: Mood normal.        Behavior: Behavior normal.        Thought Content: Thought content normal.        Judgment: Judgment normal.     Results for orders placed or performed in visit on 10/24/19  B12 and Folate Panel  Result Value Ref Range   Vitamin B-12 1,623 (H) 232 - 1,245 pg/mL   Folate 7.5 >3.0 ng/mL  VITAMIN D 25 Hydroxy (Vit-D Deficiency, Fractures)  Result Value Ref Range   Vit D, 25-Hydroxy 47.4 30.0 - 100.0 ng/mL  Microalbumin, Urine Waived  Result Value Ref Range   Microalb, Ur Waived 10 0 - 19 mg/L   Creatinine, Urine Waived 100 10 - 300  mg/dL   Microalb/Creat Ratio <30 <30 mg/g  UA/M w/rflx Culture, Routine   Specimen: Urine   URINE  Result Value Ref Range   Specific Gravity, UA 1.015 1.005 - 1.030   pH, UA 7.0 5.0 - 7.5   Color, UA Yellow Yellow   Appearance Ur Clear Clear   Leukocytes,UA Negative Negative   Protein,UA Negative Negative/Trace   Glucose, UA Negative Negative   Ketones, UA Negative Negative   RBC, UA Negative Negative   Bilirubin, UA Negative Negative   Urobilinogen, Ur 0.2 0.2 - 1.0 mg/dL   Nitrite, UA Negative Negative  TSH  Result Value Ref Range   TSH 2.310 0.450 - 4.500 uIU/mL  Lipid Panel w/o Chol/HDL Ratio  Result Value Ref Range   Cholesterol, Total 192 100 - 199 mg/dL   Triglycerides 133 0 - 149 mg/dL   HDL 62 >39 mg/dL   VLDL Cholesterol Cal 23 5 - 40 mg/dL   LDL Chol Calc (NIH) 107 (H) 0 - 99 mg/dL  Comprehensive metabolic panel  Result Value Ref Range   Glucose 82 65 - 99 mg/dL   BUN 13 8 - 27 mg/dL   Creatinine, Ser 1.07 (H) 0.57 - 1.00 mg/dL   GFR calc non Af Amer 52 (L) >59 mL/min/1.73   GFR calc Af Amer 60 >59 mL/min/1.73   BUN/Creatinine Ratio 12 12 - 28   Sodium 141 134 - 144 mmol/L   Potassium 4.3 3.5 - 5.2 mmol/L   Chloride 102 96 - 106 mmol/L   CO2 28 20 - 29 mmol/L   Calcium 9.4 8.7 - 10.3 mg/dL   Total Protein 6.6 6.0 - 8.5 g/dL   Albumin 4.1 3.7 - 4.7 g/dL   Globulin, Total 2.5 1.5 - 4.5 g/dL   Albumin/Globulin Ratio 1.6 1.2 - 2.2   Bilirubin Total 0.3 0.0 - 1.2 mg/dL   Alkaline Phosphatase 85 39 - 117 IU/L   AST 16 0 - 40 IU/L   ALT 7 0 - 32 IU/L  CBC with Differential/Platelet  Result Value Ref Range   WBC 6.0 3.4 - 10.8 x10E3/uL   RBC 4.77 3.77 - 5.28 x10E6/uL   Hemoglobin 12.8 11.1 - 15.9 g/dL   Hematocrit 38.7 34.0 - 46.6 %   MCV 81 79 - 97 fL   MCH 26.8 26.6 - 33.0 pg   MCHC  33.1 31.5 - 35.7 g/dL   RDW 13.8 11.7 - 15.4 %   Platelets 355 150 - 450 x10E3/uL   Neutrophils 63 Not Estab. %   Lymphs 24 Not Estab. %   Monocytes 7 Not Estab. %   Eos  5 Not Estab. %   Basos 1 Not Estab. %   Neutrophils Absolute 3.8 1.4 - 7.0 x10E3/uL   Lymphocytes Absolute 1.5 0.7 - 3.1 x10E3/uL   Monocytes Absolute 0.4 0.1 - 0.9 x10E3/uL   EOS (ABSOLUTE) 0.3 0.0 - 0.4 x10E3/uL   Basophils Absolute 0.0 0.0 - 0.2 x10E3/uL   Immature Granulocytes 0 Not Estab. %   Immature Grans (Abs) 0.0 0.0 - 0.1 x10E3/uL      Assessment & Plan:   Problem List Items Addressed This Visit    None    Visit Diagnoses    Ganglion cyst of finger of right hand    -  Primary   Toradol shot given today for comfort. Will get her into ortho- referral generated today. Call with any concerns.    Relevant Medications   ketorolac (TORADOL) injection 30 mg   Other Relevant Orders   Ambulatory referral to Orthopedic Surgery       Follow up plan: Return as scheduled.

## 2020-01-26 ENCOUNTER — Ambulatory Visit: Payer: Medicare PPO | Attending: Internal Medicine

## 2020-01-26 DIAGNOSIS — Z23 Encounter for immunization: Secondary | ICD-10-CM | POA: Insufficient documentation

## 2020-01-26 NOTE — Progress Notes (Signed)
   Covid-19 Vaccination Clinic  Name:  Nancy Mack    MRN: [id number] DOB: [birth year]  01/26/2020  Ms. Nancy Mack was observed post Covid-19 immunization for 30 minutes based on pre-vaccination screening without incidence. She was provided with Vaccine Information Sheet and instruction to access the V-Safe system.   Ms. Nancy Mack was instructed to call 911 with any severe reactions post vaccine: Marland Kitchen Difficulty breathing  . Swelling of your face and throat  . A fast heartbeat  . A bad rash all over your body  . Dizziness and weakness    Immunizations Administered    Name Date Dose VIS Date Route   Moderna COVID-19 Vaccine 01/26/2020 12:12 PM 0.5 mL 11/18/2019 Intramuscular   Manufacturer: Moderna   Lot: [id number]   [id number]

## 2020-02-23 ENCOUNTER — Ambulatory Visit (INDEPENDENT_AMBULATORY_CARE_PROVIDER_SITE_OTHER): Payer: Medicare PPO

## 2020-02-23 VITALS — BP 128/84 | HR 92 | Wt 154.0 lb

## 2020-02-23 DIAGNOSIS — Z0000 Encounter for general adult medical examination without abnormal findings: Secondary | ICD-10-CM | POA: Diagnosis not present

## 2020-02-23 NOTE — Patient Instructions (Signed)
Nancy Mack , Thank you for taking time to come for your Medicare Wellness Visit. I appreciate your ongoing commitment to your health goals. Please review the following plan we discussed and let me know if I can assist you in the future.   Screening recommendations/referrals: Colonoscopy: up to date Mammogram: up to date  Bone Density: up to date  Recommended yearly ophthalmology/optometry visit for glaucoma screening and checkup Recommended yearly dental visit for hygiene and checkup  Vaccinations: Influenza vaccine: up to date  Pneumococcal vaccine: up to date  Tdap vaccine: up to date  Shingles vaccine: up to date    Covid-19: completed first dose, second dose scheduled   Advanced directives: Advance directive discussed with you today.   Conditions/risks identified: Increase exercise to at least 3 times a week for 30 minutes.   Next appointment: Follow up in one year for your annual wellness visit    Preventive Care 65 Years and Older, Female Preventive care refers to lifestyle choices and visits with your health care provider that can promote health and wellness. What does preventive care include?  A yearly physical exam. This is also called an annual well check.  Dental exams once or twice a year.  Routine eye exams. Ask your health care provider how often you should have your eyes checked.  Personal lifestyle choices, including:  Daily care of your teeth and gums.  Regular physical activity.  Eating a healthy diet.  Avoiding tobacco and drug use.  Limiting alcohol use.  Practicing safe sex.  Taking low-dose aspirin every day.  Taking vitamin and mineral supplements as recommended by your health care provider. What happens during an annual well check? The services and screenings done by your health care provider during your annual well check will depend on your age, overall health, lifestyle risk factors, and family history of disease. Counseling  Your  health care provider may ask you questions about your:  Alcohol use.  Tobacco use.  Drug use.  Emotional well-being.  Home and relationship well-being.  Sexual activity.  Eating habits.  History of falls.  Memory and ability to understand (cognition).  Work and work Statistician.  Reproductive health. Screening  You may have the following tests or measurements:  Height, weight, and BMI.  Blood pressure.  Lipid and cholesterol levels. These may be checked every 5 years, or more frequently if you are over 11 years old.  Skin check.  Lung cancer screening. You may have this screening every year starting at age 67 if you have a 30-pack-year history of smoking and currently smoke or have quit within the past 15 years.  Fecal occult blood test (FOBT) of the stool. You may have this test every year starting at age 76.  Flexible sigmoidoscopy or colonoscopy. You may have a sigmoidoscopy every 5 years or a colonoscopy every 10 years starting at age 73.  Hepatitis C blood test.  Hepatitis B blood test.  Sexually transmitted disease (STD) testing.  Diabetes screening. This is done by checking your blood sugar (glucose) after you have not eaten for a while (fasting). You may have this done every 1-3 years.  Bone density scan. This is done to screen for osteoporosis. You may have this done starting at age 10.  Mammogram. This may be done every 1-2 years. Talk to your health care provider about how often you should have regular mammograms. Talk with your health care provider about your test results, treatment options, and if necessary, the need for more  tests. Vaccines  Your health care provider may recommend certain vaccines, such as:  Influenza vaccine. This is recommended every year.  Tetanus, diphtheria, and acellular pertussis (Tdap, Td) vaccine. You may need a Td booster every 10 years.  Zoster vaccine. You may need this after age 26.  Pneumococcal 13-valent  conjugate (PCV13) vaccine. One dose is recommended after age 32.  Pneumococcal polysaccharide (PPSV23) vaccine. One dose is recommended after age 18. Talk to your health care provider about which screenings and vaccines you need and how often you need them. This information is not intended to replace advice given to you by your health care provider. Make sure you discuss any questions you have with your health care provider. Document Released: 12/31/2015 Document Revised: 08/23/2016 Document Reviewed: 10/05/2015 Elsevier Interactive Patient Education  2017 [place] Prevention in the Home Falls can cause injuries. They can happen to people of all ages. There are many things you can do to make your home safe and to help prevent falls. What can I do on the outside of my home?  Regularly fix the edges of walkways and driveways and fix any cracks.  Remove anything that might make you trip as you walk through a door, such as a raised step or threshold.  Trim any bushes or trees on the path to your home.  Use bright outdoor lighting.  Clear any walking paths of anything that might make someone trip, such as rocks or tools.  Regularly check to see if handrails are loose or broken. Make sure that both sides of any steps have handrails.  Any raised decks and porches should have guardrails on the edges.  Have any leaves, snow, or ice cleared regularly.  Use sand or salt on walking paths during winter.  Clean up any spills in your garage right away. This includes oil or grease spills. What can I do in the bathroom?  Use night lights.  Install grab bars by the toilet and in the tub and shower. Do not use towel bars as grab bars.  Use non-skid mats or decals in the tub or shower.  If you need to sit down in the shower, use a plastic, non-slip stool.  Keep the floor dry. Clean up any water that spills on the floor as soon as it happens.  Remove soap buildup in the tub or  shower regularly.  Attach bath mats securely with double-sided non-slip rug tape.  Do not have throw rugs and other things on the floor that can make you trip. What can I do in the bedroom?  Use night lights.  Make sure that you have a light by your bed that is easy to reach.  Do not use any sheets or blankets that are too big for your bed. They should not hang down onto the floor.  Have a firm chair that has side arms. You can use this for support while you get dressed.  Do not have throw rugs and other things on the floor that can make you trip. What can I do in the kitchen?  Clean up any spills right away.  Avoid walking on wet floors.  Keep items that you use a lot in easy-to-reach places.  If you need to reach something above you, use a strong step stool that has a grab bar.  Keep electrical cords out of the way.  Do not use floor polish or wax that makes floors slippery. If you must use wax, use non-skid floor  wax.  Do not have throw rugs and other things on the floor that can make you trip. What can I do with my stairs?  Do not leave any items on the stairs.  Make sure that there are handrails on both sides of the stairs and use them. Fix handrails that are broken or loose. Make sure that handrails are as long as the stairways.  Check any carpeting to make sure that it is firmly attached to the stairs. Fix any carpet that is loose or worn.  Avoid having throw rugs at the top or bottom of the stairs. If you do have throw rugs, attach them to the floor with carpet tape.  Make sure that you have a light switch at the top of the stairs and the bottom of the stairs. If you do not have them, ask someone to add them for you. What else can I do to help prevent falls?  Wear shoes that:  Do not have high heels.  Have rubber bottoms.  Are comfortable and fit you well.  Are closed at the toe. Do not wear sandals.  If you use a stepladder:  Make sure that it is fully  opened. Do not climb a closed stepladder.  Make sure that both sides of the stepladder are locked into place.  Ask someone to hold it for you, if possible.  Clearly mark and make sure that you can see:  Any grab bars or handrails.  First and last steps.  Where the edge of each step is.  Use tools that help you move around (mobility aids) if they are needed. These include:  Canes.  Walkers.  Scooters.  Crutches.  Turn on the lights when you go into a dark area. Replace any light bulbs as soon as they burn out.  Set up your furniture so you have a clear path. Avoid moving your furniture around.  If any of your floors are uneven, fix them.  If there are any pets around you, be aware of where they are.  Review your medicines with your doctor. Some medicines can make you feel dizzy. This can increase your chance of falling. Ask your doctor what other things that you can do to help prevent falls. This information is not intended to replace advice given to you by your health care provider. Make sure you discuss any questions you have with your health care provider. Document Released: 09/30/2009 Document Revised: 05/11/2016 Document Reviewed: 01/08/2015 Elsevier Interactive Patient Education  2017 [employer].

## 2020-02-23 NOTE — Progress Notes (Signed)
Subjective:   Nancy Mack is a 74 y.o. female who presents for Medicare Annual (Subsequent) preventive examination.  This visit is being conducted via phone call  - after an attmept to do on video chat - due to the COVID-19 pandemic. This patient has given me verbal consent via phone to conduct this visit, patient states they are participating from their home address. Some vital signs may be absent or patient reported.   Patient identification: identified by name, DOB, and current address.    Review of Systems:   Cardiac Risk Factors include: advanced age (>74men, >27 women);dyslipidemia;hypertension     Objective:     Vitals: BP 128/84   Pulse 92   Wt 154 lb (69.9 kg)   LMP 07/31/1981 (Approximate)   BMI 27.28 kg/m   Body mass index is 27.28 kg/m.  Advanced Directives 02/23/2020 03/04/2019 02/20/2019 04/01/2018 02/18/2018 01/23/2017 11/05/2015  Does Patient Have a Medical Advance Directive? No No No No No No No  Does patient want to make changes to medical advance directive? - - - - Yes (MAU/Ambulatory/Procedural Areas - Information given) - -  Would patient like information on creating a medical advance directive? - - Yes (MAU/Ambulatory/Procedural Areas - Information given) No - Patient declined - - -    Tobacco Social History   Tobacco Use  Smoking Status Former Smoker  . Packs/day: 2.00  . Years: 2.00  . Pack years: 4.00  . Types: Cigarettes  . Quit date: 12/18/1978  . Years since quitting: 41.2  Smokeless Tobacco Never Used  Tobacco Comment   quit in 1980     Counseling given: Not Answered Comment: quit in 1980   Clinical Intake:  Pre-visit preparation completed: Yes  Pain : No/denies pain     Nutritional Risks: None Diabetes: No  How often do you need to have someone help you when you read instructions, pamphlets, or other written materials from your doctor or pharmacy?: 1 - Never  Interpreter Needed?: No  Information entered by :: Emilie Carp,LPN  Past Medical History:  Diagnosis Date  . Allergy   . Asthma   . Carpal tunnel syndrome   . Eczema   . Globus pharyngeus   . Hyperlipidemia   . Hypertension   . Laryngopharyngeal reflux   . Menopause   . Neuroma    right foot  . Osteoporosis   . Raynaud's disease   . Rhinitis due to pollen   . SOB (shortness of breath)    Past Surgical History:  Procedure Laterality Date  . COLONOSCOPY WITH PROPOFOL N/A 03/04/2019   Procedure: COLONOSCOPY WITH PROPOFOL;  Surgeon: [name], MD;  Location: [facility] ENDOSCOPY;  Service: Endoscopy;  Laterality: N/A;  . EYE SURGERY Left 2015   broken blood veesel, Dr.Matthews   . EYE SURGERY Bilateral 2017   In [place]   . FOOT NEUROMA SURGERY Right   . PARTIAL HYSTERECTOMY    . SHOULDER ARTHROSCOPY WITH BICEPSTENOTOMY Right 04/08/2018   Procedure: SHOULDER ARTHROSCOPY WITH BICEPSTENOTOMY;  Surgeon: Lovell Sheehan, MD;  Location: [facility] ORS;  Service: Orthopedics;  Laterality: Right;   Family History  Problem Relation Age of Onset  . Arthritis Mother   . Heart failure Mother   . Hypertension Mother   . Hyperlipidemia Mother   . Heart disease Mother   . Dementia Mother   . Asthma Father   . Aneurysm Sister   . Alzheimer's disease Maternal Grandmother    Social History   Socioeconomic History  .  Marital status: Married    Spouse name: Not on file  . Number of children: Not on file  . Years of education: Not on file  . Highest education level: Bachelor's degree (e.g., BA, AB, BS)  Occupational History  . Occupation: retired  Tobacco Use  . Smoking status: Former Smoker    Packs/day: 2.00    Years: 2.00    Pack years: 4.00    Types: Cigarettes    Quit date: 12/18/1978    Years since quitting: 41.2  . Smokeless tobacco: Never Used  . Tobacco comment: quit in 1980  Substance and Sexual Activity  . Alcohol use: Not Currently    Alcohol/week: 0.0 standard drinks    Comment: wine on special occasions  . Drug use: No  .  Sexual activity: Yes  Other Topics Concern  . Not on file  Social History Narrative   Attends church   Social Determinants of Health   Financial Resource Strain:   . Difficulty of Paying Living Expenses: Not on file  Food Insecurity:   . Worried About Charity fundraiser in the Last Year: Not on file  . Ran Out of Food in the Last Year: Not on file  Transportation Needs:   . Lack of Transportation (Medical): Not on file  . Lack of Transportation (Non-Medical): Not on file  Physical Activity:   . Days of Exercise per Week: Not on file  . Minutes of Exercise per Session: Not on file  Stress:   . Feeling of Stress : Not on file  Social Connections:   . Frequency of Communication with Friends and Family: Not on file  . Frequency of Social Gatherings with Friends and Family: Not on file  . Attends Religious Services: Not on file  . Active Member of Clubs or Organizations: Not on file  . Attends Archivist Meetings: Not on file  . Marital Status: Not on file    Outpatient Encounter Medications as of 02/23/2020  Medication Sig  . Ascorbic Acid (VITAMIN C) 100 MG tablet Take 100 mg by mouth daily.  Marland Kitchen aspirin 81 MG tablet Take 81 mg by mouth daily.  . Azelastine HCl 137 MCG/SPRAY SOLN Place 1 spray 2 (two) times daily into both nostrils. Use in each nostril as directed  . Cyanocobalamin (VITAMIN B-12 PO) Take by mouth.  . fluticasone (FLONASE) 50 MCG/ACT nasal spray Place 2 sprays into both nostrils daily.  Marland Kitchen albuterol (PROAIR HFA) 108 (90 Base) MCG/ACT inhaler Inhale 2 puffs into the lungs every 6 (six) hours as needed. (Patient not taking: Reported on 02/23/2020)  . ALLERGY RELIEF 180 MG tablet Take 1 tablet (180 mg total) by mouth daily. (Patient not taking: Reported on 02/23/2020)  . atorvastatin (LIPITOR) 20 MG tablet Take 1 tablet (20 mg total) by mouth daily. (Patient not taking: Reported on 02/23/2020)  . Cholecalciferol (VITAMIN D) 2000 UNITS tablet Take 2,000 Units by mouth  daily.  Marland Kitchen estradiol (ESTRACE) 2 MG tablet Take 1 tablet (2 mg total) by mouth daily. (Patient not taking: Reported on 02/23/2020)  . Fluticasone-Salmeterol (ADVAIR DISKUS) 500-50 MCG/DOSE AEPB Inhale 1 puff into the lungs 2 (two) times daily. (Patient not taking: Reported on 02/23/2020)  . hydrochlorothiazide (HYDRODIURIL) 25 MG tablet Take 0.5 tablets (12.5 mg total) by mouth daily. (Patient not taking: Reported on 02/23/2020)  . triamcinolone cream (KENALOG) 0.1 % Apply 1 application topically 2 (two) times daily. (Patient not taking: Reported on 02/23/2020)  . [DISCONTINUED] meloxicam (MOBIC) 15  MG tablet meloxicam 15 mg tablet  . [DISCONTINUED] montelukast (SINGULAIR) 10 MG tablet Take 1 tablet (10 mg total) by mouth at bedtime.  . [DISCONTINUED] omeprazole (PRILOSEC) 40 MG capsule   . [DISCONTINUED] ondansetron (ZOFRAN) 4 MG tablet Take 1 tablet (4 mg total) by mouth every 8 (eight) hours as needed for nausea or vomiting. (Patient not taking: Reported on 02/23/2020)   No facility-administered encounter medications on file as of 02/23/2020.    Activities of Daily Living In your present state of health, do you have any difficulty performing the following activities: 02/23/2020 04/17/2019  Hearing? N N  Vision? Y N  Comment Dr.Richardson, glasses -  Difficulty concentrating or making decisions? N N  Walking or climbing stairs? N N  Dressing or bathing? N N  Doing errands, shopping? N N  Preparing Food and eating ? N -  Using the Toilet? N -  In the past six months, have you accidently leaked urine? N -  Do you have problems with loss of bowel control? N -  Managing your Medications? N -  Managing your Finances? N -  Housekeeping or managing your Housekeeping? N -  Some recent data might be hidden    Patient Care Team: Valerie Roys, DO as PCP - General (Family Medicine) Kathrine Haddock, NP (Nurse Practitioner) [name], MD as Consulting Physician (Gastroenterology) Clyde Canterbury, MD as  Referring Physician (Otolaryngology) [employer] [facility])    Assessment:   This is a routine wellness examination for Analyn.  Exercise Activities and Dietary recommendations Current Exercise Habits: The patient does not participate in regular exercise at present, Exercise limited by: None identified  Goals Addressed   None     Fall Risk: Fall Risk  02/23/2020 04/17/2019 02/20/2019 02/18/2018 01/25/2018  Falls in the past year? 0 0 0 No No  Number falls in past yr: 0 0 - - -  Injury with Fall? 0 0 - - -    FALL RISK PREVENTION PERTAINING TO THE HOME:  Any stairs in or around the home? No  If so, are there any without handrails? No   Home free of loose throw rugs in walkways, pet beds, electrical cords, etc? Yes  Adequate lighting in your home to reduce risk of falls? Yes   ASSISTIVE DEVICES UTILIZED TO PREVENT FALLS:  Life alert? No  Use of a cane, walker or w/c? No  Grab bars in the bathroom? No  Shower chair or bench in shower? No  Elevated toilet seat or a handicapped toilet? No   DME ORDERS:  DME order needed?  No   TIMED UP AND GO:  Unable to perform   Depression Screen PHQ 2/9 Scores 02/23/2020 02/20/2019 02/18/2018 01/25/2018  PHQ - 2 Score 0 0 0 0  PHQ- 9 Score - - 0 0     Cognitive Function     6CIT Screen 02/18/2018  What Year? 0 points  What month? 0 points  What time? 0 points  Count back from 20 0 points  Months in reverse 0 points  Repeat phrase 2 points  Total Score 2    Immunization History  Administered Date(s) Administered  . Fluad Quad(high Dose 65+) 09/26/2019  . Influenza, High Dose Seasonal PF 09/21/2016, 10/02/2017, 10/23/2018  . Influenza,inj,Quad PF,6+ Mos 10/01/2015  . Influenza-Unspecified 09/10/2012, 09/15/2014, 09/21/2016  . Moderna SARS-COVID-2 Vaccination 01/26/2020  . Pneumococcal Conjugate-13 01/22/2015  . Pneumococcal Polysaccharide-23 06/05/2012  . Pneumococcal-Unspecified 10/19/2014  . Td 08/26/2009  . Zoster  03/13/2007  .  Zoster Recombinat (Shingrix) 08/12/2019, 11/12/2019    Qualifies for Shingles Vaccine? Shingrix completed   Tdap: Discussed need for TD/TDAP vaccine, patient verbalized understanding that this is not covered as a preventative with there insurance and to call the office if he develops any new skin injuries, ie: cuts, scrapes, bug bites, or open wounds.  Flu Vaccine: up to date   Pneumococcal Vaccine: up to date   Covid-19 Vaccine: completed first dose   Screening Tests Health Maintenance  Topic Date Due  . TETANUS/TDAP  02/22/2021 (Originally 08/27/2019)  . MAMMOGRAM  06/16/2021  . COLONOSCOPY  03/03/2029  . INFLUENZA VACCINE  Completed  . DEXA SCAN  Completed  . Hepatitis C Screening  Completed  . PNA vac Low Risk Adult  Completed    Cancer Screenings:  Colorectal Screening: Completed  Repeat every 03/04/2019   Mammogram: Completed 06/17/2019. Repeat every year  Bone Density: Completed 2017  Lung Cancer Screening: (Low Dose CT Chest recommended if Age 73-80 years, 30 pack-year currently smoking OR have quit w/in 15years.) does not qualify.    Additional Screening:  Hepatitis C Screening: does qualify; Completed 03/04/2019  Vision Screening: Recommended annual ophthalmology exams for early detection of glaucoma and other disorders of the eye. Is the patient up to date with their annual eye exam?  Yes  Who is the provider or what is the name of the office in which the pt attends annual eye exams? Dr.Richardson    Dental Screening: Recommended annual dental exams for proper oral hygiene  Community Resource Referral:  CRR required this visit?  No       Plan:  I have personally reviewed and addressed the Medicare Annual Wellness questionnaire and have noted the following in the patient's chart:  A. Medical and social history B. Use of alcohol, tobacco or illicit drugs  C. Current medications and supplements D. Functional ability and status E.    Nutritional status F.  Physical activity G. Advance directives H. List of other physicians I.  Hospitalizations, surgeries, and ER visits in previous 12 months J.  [place] such as hearing and vision if needed, cognitive and depression L. Referrals and appointments   In addition, I have reviewed and discussed with patient certain preventive protocols, quality metrics, and best practice recommendations. A written personalized care plan for preventive services as well as general preventive health recommendations were provided to patient.  Signed,    Bevelyn Ngo, LPN  [id number] Nurse Health Advisor   Nurse Notes: patient has stopped all of her medications, states she was having very low energy and went to cardiologist and was told to stop her atorvastatin as she had some abnormalities on EKG.    Scheduled office visit as she had a few issues she needed to discuss.

## 2020-02-25 ENCOUNTER — Ambulatory Visit: Payer: Medicare PPO | Attending: Internal Medicine

## 2020-02-25 DIAGNOSIS — Z23 Encounter for immunization: Secondary | ICD-10-CM | POA: Insufficient documentation

## 2020-02-25 NOTE — Progress Notes (Signed)
   Covid-[facility] Vaccination [facility]  Name:  SYBLE WIEDMANN    MRN: [id number] DOB: 10-04-1946  02/25/2020  Ms. Celestine was observed post Covid-19 immunization for 15 minutes without incident. She was provided with Vaccine Information Sheet and instruction to access the V-Safe system.   Ms. Rabadi was instructed to call 911 with any severe reactions post vaccine: Marland Kitchen Difficulty breathing  . Swelling of face and throat  . A fast heartbeat  . A bad rash all over body  . Dizziness and weakness   Immunizations Administered    Name Date Dose VIS Date Route   Moderna COVID-19 Vaccine 02/25/2020 12:02 PM 0.5 mL 11/18/2019 Intramuscular   Manufacturer: Moderna   Lot: [id number]   [id number]

## 2020-02-26 ENCOUNTER — Telehealth (INDEPENDENT_AMBULATORY_CARE_PROVIDER_SITE_OTHER): Payer: Medicare PPO | Admitting: Family Medicine

## 2020-02-26 ENCOUNTER — Encounter: Payer: Self-pay | Admitting: Family Medicine

## 2020-02-26 DIAGNOSIS — Z780 Asymptomatic menopausal state: Secondary | ICD-10-CM

## 2020-02-26 DIAGNOSIS — E782 Mixed hyperlipidemia: Secondary | ICD-10-CM

## 2020-02-26 DIAGNOSIS — I10 Essential (primary) hypertension: Secondary | ICD-10-CM | POA: Diagnosis not present

## 2020-02-26 DIAGNOSIS — J45909 Unspecified asthma, uncomplicated: Secondary | ICD-10-CM | POA: Diagnosis not present

## 2020-02-26 MED ORDER — FEXOFENADINE HCL 180 MG PO TABS: ORAL_TABLET | ORAL | 3 refills | Status: DC

## 2020-02-26 MED ORDER — FLUTICASONE PROPIONATE 50 MCG/ACT NA SUSP: 2.0000 | Freq: Every day | NASAL | 3 refills | Status: DC

## 2020-02-26 MED ORDER — FLUTICASONE-SALMETEROL 500-50 MCG/DOSE IN AEPB: 1.0000 | INHALATION_SPRAY | Freq: Two times a day (BID) | RESPIRATORY_TRACT | 1 refills | Status: DC

## 2020-02-26 MED ORDER — AZELASTINE HCL 137 MCG/SPRAY NA SOLN: 1.0000 | Freq: Every day | NASAL | 3 refills | Status: DC

## 2020-02-26 MED ORDER — ALBUTEROL SULFATE HFA 108 (90 BASE) MCG/ACT IN AERS: 2.0000 | INHALATION_SPRAY | Freq: Four times a day (QID) | RESPIRATORY_TRACT | 3 refills | Status: DC | PRN

## 2020-02-26 NOTE — Assessment & Plan Note (Signed)
Encouraged her to restart her asthma medicine. Call with any concerns.

## 2020-02-26 NOTE — Progress Notes (Signed)
BP 127/79   Pulse (!) 108   Temp (!) 97.3 F (36.3 C)   Wt 154 lb (69.9 kg)   LMP 07/31/1981 (Approximate)   BMI 27.28 kg/m    Subjective:    Patient ID: Nancy Mack, female    DOB: [birth year], 74 y.o.   MRN: [id number]  HPI: Nancy Mack is a 74 y.o. female  Chief Complaint  Patient presents with  . Tachycardia   Carryl stopped all her medicines after her appointment with Dr. Nehemiah Massed in February as she had been feeling tired and SOB on exertion. She's been using some nasal sprays, but that's it. She's feeling MUCH better since doing hat. No longer having SOB or feeling as tired.   HYPERTENSION / HYPERLIPIDEMIA Satisfied with current treatment? yes Duration of hypertension: chronic BP monitoring frequency: rarely BP medication side effects: no Past BP meds: HCTZ Duration of hyperlipidemia: chronic Cholesterol medication side effects: unclear Cholesterol supplements: none Past cholesterol medications: atorvastatin Medication compliance: currently off all medicine Aspirin: no Recent stressors: no Recurrent headaches: no Visual changes: no Palpitations: no Dyspnea: no Chest pain: no Lower extremity edema: no Dizzy/lightheaded: no   ASTHMA Asthma status: stable Satisfied with current treatment?: yes Albuterol/rescue inhaler frequency: never Dyspnea frequency: rarely Wheezing frequency: rarely Cough frequency: rarely Nocturnal symptom frequency: never Limitation of activity: no Current upper respiratory symptoms: no Aerochamber/spacer use: no Visits to ER or Urgent Care in past year: no Pneumovax: Up to Date Influenza: Up to Date    Relevant past medical, surgical, family and social history reviewed and updated as indicated. Interim medical history since our last visit reviewed. Allergies and medications reviewed and updated.  Review of Systems  Constitutional: Negative.   Respiratory: Negative.   Cardiovascular: Negative.     Musculoskeletal: Negative.   Psychiatric/Behavioral: Negative.     Per HPI unless specifically indicated above     Objective:    BP 127/79   Pulse (!) 108   Temp (!) 97.3 F (36.3 C)   Wt 154 lb (69.9 kg)   LMP 07/31/1981 (Approximate)   BMI 27.28 kg/m   Wt Readings from Last 3 Encounters:  02/26/20 154 lb (69.9 kg)  02/23/20 154 lb (69.9 kg)  07/07/19 156 lb 4 oz (70.9 kg)    Physical Exam Vitals and nursing note reviewed.  Constitutional:      General: She is not in acute distress.    Appearance: Normal appearance. She is not ill-appearing, toxic-appearing or diaphoretic.  HENT:     Head: Normocephalic and atraumatic.     Right Ear: External ear normal.     Left Ear: External ear normal.     Nose: Nose normal.     Mouth/Throat:     Mouth: Mucous membranes are moist.     Pharynx: Oropharynx is clear.  Eyes:     General: No scleral icterus.       Right eye: No discharge.        Left eye: No discharge.     Conjunctiva/sclera: Conjunctivae normal.     Pupils: Pupils are equal, round, and reactive to light.  Pulmonary:     Effort: Pulmonary effort is normal. No respiratory distress.     Comments: Speaking in full sentences Musculoskeletal:        General: Normal range of motion.     Cervical back: Normal range of motion.  Skin:    Coloration: Skin is not jaundiced or pale.     Findings: No bruising,  erythema, lesion or rash.  Neurological:     Mental Status: She is alert and oriented to person, place, and time. Mental status is at baseline.  Psychiatric:        Mood and Affect: Mood normal.        Behavior: Behavior normal.        Thought Content: Thought content normal.        Judgment: Judgment normal.     Results for orders placed or performed in visit on 10/24/19  B12 and Folate Panel  Result Value Ref Range   Vitamin B-12 1,623 (H) 232 - 1,245 pg/mL   Folate 7.5 >3.0 ng/mL  VITAMIN D 25 Hydroxy (Vit-D Deficiency, Fractures)  Result Value Ref Range    Vit D, 25-Hydroxy 47.4 30.0 - 100.0 ng/mL  Microalbumin, Urine Waived  Result Value Ref Range   Microalb, Ur Waived 10 0 - 19 mg/L   Creatinine, Urine Waived 100 10 - 300 mg/dL   Microalb/Creat Ratio <30 <30 mg/g  UA/M w/rflx Culture, Routine   Specimen: Urine   URINE  Result Value Ref Range   Specific Gravity, UA 1.015 1.005 - 1.030   pH, UA 7.0 5.0 - 7.5   Color, UA Yellow Yellow   Appearance Ur Clear Clear   Leukocytes,UA Negative Negative   Protein,UA Negative Negative/Trace   Glucose, UA Negative Negative   Ketones, UA Negative Negative   RBC, UA Negative Negative   Bilirubin, UA Negative Negative   Urobilinogen, Ur 0.2 0.2 - 1.0 mg/dL   Nitrite, UA Negative Negative  TSH  Result Value Ref Range   TSH 2.310 0.450 - 4.500 uIU/mL  Lipid Panel w/o Chol/HDL Ratio  Result Value Ref Range   Cholesterol, Total 192 100 - 199 mg/dL   Triglycerides 133 0 - 149 mg/dL   HDL 62 >39 mg/dL   VLDL Cholesterol Cal 23 5 - 40 mg/dL   LDL Chol Calc (NIH) 107 (H) 0 - 99 mg/dL  Comprehensive metabolic panel  Result Value Ref Range   Glucose 82 65 - 99 mg/dL   BUN 13 8 - 27 mg/dL   Creatinine, Ser 1.07 (H) 0.57 - 1.00 mg/dL   GFR calc non Af Amer 52 (L) >59 mL/min/1.73   GFR calc Af Amer 60 >59 mL/min/1.73   BUN/Creatinine Ratio 12 12 - 28   Sodium 141 134 - 144 mmol/L   Potassium 4.3 3.5 - 5.2 mmol/L   Chloride 102 96 - 106 mmol/L   CO2 28 20 - 29 mmol/L   Calcium 9.4 8.7 - 10.3 mg/dL   Total Protein 6.6 6.0 - 8.5 g/dL   Albumin 4.1 3.7 - 4.7 g/dL   Globulin, Total 2.5 1.5 - 4.5 g/dL   Albumin/Globulin Ratio 1.6 1.2 - 2.2   Bilirubin Total 0.3 0.0 - 1.2 mg/dL   Alkaline Phosphatase 85 39 - 117 IU/L   AST 16 0 - 40 IU/L   ALT 7 0 - 32 IU/L  CBC with Differential/Platelet  Result Value Ref Range   WBC 6.0 3.4 - 10.8 x10E3/uL   RBC 4.77 3.77 - 5.28 x10E6/uL   Hemoglobin 12.8 11.1 - 15.9 g/dL   Hematocrit 38.7 34.0 - 46.6 %   MCV 81 79 - 97 fL   MCH 26.8 26.6 - 33.0 pg    MCHC 33.1 31.5 - 35.7 g/dL   RDW 13.8 11.7 - 15.4 %   Platelets 355 150 - 450 x10E3/uL   Neutrophils 63 Not Estab. %  Lymphs 24 Not Estab. %   Monocytes 7 Not Estab. %   Eos 5 Not Estab. %   Basos 1 Not Estab. %   Neutrophils Absolute 3.8 1.4 - 7.0 x10E3/uL   Lymphocytes Absolute 1.5 0.7 - 3.1 x10E3/uL   Monocytes Absolute 0.4 0.1 - 0.9 x10E3/uL   EOS (ABSOLUTE) 0.3 0.0 - 0.4 x10E3/uL   Basophils Absolute 0.0 0.0 - 0.2 x10E3/uL   Immature Granulocytes 0 Not Estab. %   Immature Grans (Abs) 0.0 0.0 - 0.1 x10E3/uL      Assessment & Plan:   Problem List Items Addressed This Visit      Cardiovascular and Mediastinum   Benign essential HTN    BP running well off meds. Will continue off meds and continue to monitor. Call with any concerns. Recheck at follow up in May.        Respiratory   Asthma    Encouraged her to restart her asthma medicine. Call with any concerns.       Relevant Medications   albuterol (PROAIR HFA) 108 (90 Base) MCG/ACT inhaler   Fluticasone-Salmeterol (ADVAIR DISKUS) 500-50 MCG/DOSE AEPB     Other   Hyperlipidemia    Feeling good off meds. Will restart her meds for about a week and see how she feels. Call with any concerns. Recheck at follow up in May.      Menopause    Doing well off estradiol. Will discontinue it.           Follow up plan: Return if symptoms worsen or fail to improve.   . This visit was completed via MyChart due to the restrictions of the COVID-19 pandemic. All issues as above were discussed and addressed. Physical exam was done as above through visual confirmation on MyChart. If it was felt that the patient should be evaluated in the office, they were directed there. The patient verbally consented to this visit. . Location of the patient: home . Location of the provider: work . Those involved with this call:  . Provider: Park Liter, DO . CMA: Tiffany Reel, CMA . Front Desk/Registration: Don Perking  . Time spent  on call: 25 minutes with patient face to face via video conference. More than 50% of this time was spent in counseling and coordination of care. 40 minutes total spent in review of patient's record and preparation of their chart.

## 2020-02-26 NOTE — Assessment & Plan Note (Addendum)
BP running well off meds. Will continue off meds and continue to monitor. Call with any concerns. Recheck at follow up in May.

## 2020-02-26 NOTE — Assessment & Plan Note (Addendum)
Feeling good off meds. Will restart her meds for about a week and see how she feels. Call with any concerns. Recheck at follow up in May.

## 2020-02-26 NOTE — Assessment & Plan Note (Signed)
Doing well off estradiol. Will discontinue it.

## 2020-03-05 ENCOUNTER — Ambulatory Visit (INDEPENDENT_AMBULATORY_CARE_PROVIDER_SITE_OTHER): Payer: Medicare PPO | Admitting: Family Medicine

## 2020-03-05 ENCOUNTER — Encounter: Payer: Self-pay | Admitting: Family Medicine

## 2020-03-05 ENCOUNTER — Other Ambulatory Visit: Payer: Self-pay

## 2020-03-05 VITALS — BP 137/84 | HR 98 | Temp 98.5°F | Ht 62.0 in | Wt 157.0 lb

## 2020-03-05 DIAGNOSIS — R109 Unspecified abdominal pain: Secondary | ICD-10-CM | POA: Diagnosis not present

## 2020-03-05 DIAGNOSIS — S39011A Strain of muscle, fascia and tendon of abdomen, initial encounter: Secondary | ICD-10-CM

## 2020-03-05 LAB — UA/M W/RFLX CULTURE, ROUTINE
Bilirubin, UA: NEGATIVE
Glucose, UA: NEGATIVE
Ketones, UA: NEGATIVE
Leukocytes,UA: NEGATIVE
Nitrite, UA: NEGATIVE
Protein,UA: NEGATIVE
RBC, UA: NEGATIVE
Specific Gravity, UA: 1.02 (ref 1.005–1.030)
Urobilinogen, Ur: 0.2 mg/dL (ref 0.2–1.0)
pH, UA: 5.5 (ref 5.0–7.5)

## 2020-03-05 MED ORDER — CYCLOBENZAPRINE HCL 10 MG PO TABS: 10.0000 mg | ORAL_TABLET | Freq: Three times a day (TID) | ORAL | 0 refills | Status: DC | PRN

## 2020-03-05 MED ORDER — KETOROLAC TROMETHAMINE 60 MG/2ML IM SOLN
60.0000 mg | Freq: Once | INTRAMUSCULAR | Status: AC
  Administered 2020-03-05: 60 mg via INTRAMUSCULAR

## 2020-03-05 MED ORDER — NAPROXEN 500 MG PO TABS: 500.0000 mg | ORAL_TABLET | Freq: Two times a day (BID) | ORAL | 0 refills | Status: DC

## 2020-03-05 NOTE — Progress Notes (Signed)
BP 137/84   Pulse 98   Temp 98.5 F (36.9 C) (Oral)   Ht 5\' 2"  (1.575 m)   Wt 157 lb (71.2 kg)   LMP 07/31/1981 (Approximate)   SpO2 94%   BMI 28.72 kg/m    Subjective:    Patient ID: Nancy Mack, female    DOB: 1946-07-25, 74 y.o.   MRN: [id number]  HPI: Nancy Mack is a 74 y.o. female  Chief Complaint  Patient presents with  . Abdominal Pain    left side since Monday. pt states she was picking up laundy and twisted herself wrong    ABDOMINAL PAIN  Duration:5 days Onset: sudden Severity: severe Quality: sharp Location:  L side of her belly  Episode duration: constant Radiation: no Frequency: constant Alleviating factors: not moving Aggravating factors: moving Status: better Treatments attempted: muscle relaxer and tylenol Fever: no Nausea: no Vomiting: no Weight loss: no Decreased appetite: no Diarrhea: no Constipation: no Blood in stool: no Heartburn: no Jaundice: no Rash: no Dysuria/urinary frequency: no Hematuria: no Recurrent NSAID use: no  Relevant past medical, surgical, family and social history reviewed and updated as indicated. Interim medical history since our last visit reviewed. Allergies and medications reviewed and updated.  Review of Systems  Per HPI unless specifically indicated above     Objective:    BP 137/84   Pulse 98   Temp 98.5 F (36.9 C) (Oral)   Ht 5\' 2"  (1.575 m)   Wt 157 lb (71.2 kg)   LMP 07/31/1981 (Approximate)   SpO2 94%   BMI 28.72 kg/m   Wt Readings from Last 3 Encounters:  03/05/20 157 lb (71.2 kg)  02/26/20 154 lb (69.9 kg)  02/23/20 154 lb (69.9 kg)    Physical Exam Vitals and nursing note reviewed.  Constitutional:      General: She is not in acute distress.    Appearance: Normal appearance. She is not ill-appearing, toxic-appearing or diaphoretic.  HENT:     Head: Normocephalic and atraumatic.     Right Ear: External ear normal.     Left Ear: External ear normal.     Nose:  Nose normal.     Mouth/Throat:     Mouth: Mucous membranes are moist.     Pharynx: Oropharynx is clear.  Eyes:     General: No scleral icterus.       Right eye: No discharge.        Left eye: No discharge.     Extraocular Movements: Extraocular movements intact.     Conjunctiva/sclera: Conjunctivae normal.     Pupils: Pupils are equal, round, and reactive to light.  Cardiovascular:     Rate and Rhythm: Normal rate and regular rhythm.     Pulses: Normal pulses.     Heart sounds: Normal heart sounds. No murmur. No friction rub. No gallop.   Pulmonary:     Effort: Pulmonary effort is normal. No respiratory distress.     Breath sounds: Normal breath sounds. No stridor. No wheezing, rhonchi or rales.  Chest:     Chest wall: No tenderness.  Abdominal:     General: Abdomen is flat. Bowel sounds are normal. There is no distension. There are no signs of injury.     Palpations: Abdomen is soft.     Tenderness: There is abdominal tenderness (oblique muscles on the L with spasm).  Musculoskeletal:        General: Normal range of motion.     Cervical  back: Normal range of motion and neck supple.  Skin:    General: Skin is warm and dry.     Capillary Refill: Capillary refill takes less than 2 seconds.     Coloration: Skin is not jaundiced or pale.     Findings: No bruising, erythema, lesion or rash.  Neurological:     General: No focal deficit present.     Mental Status: She is alert and oriented to person, place, and time. Mental status is at baseline.  Psychiatric:        Mood and Affect: Mood normal.        Behavior: Behavior normal.        Thought Content: Thought content normal.        Judgment: Judgment normal.     Results for orders placed or performed in visit on 10/24/19  B12 and Folate Panel  Result Value Ref Range   Vitamin B-12 1,623 (H) 232 - 1,245 pg/mL   Folate 7.5 >3.0 ng/mL  VITAMIN D 25 Hydroxy (Vit-D Deficiency, Fractures)  Result Value Ref Range   Vit D,  25-Hydroxy 47.4 30.0 - 100.0 ng/mL  Microalbumin, Urine Waived  Result Value Ref Range   Microalb, Ur Waived 10 0 - 19 mg/L   Creatinine, Urine Waived 100 10 - 300 mg/dL   Microalb/Creat Ratio <30 <30 mg/g  UA/M w/rflx Culture, Routine   Specimen: Urine   URINE  Result Value Ref Range   Specific Gravity, UA 1.015 1.005 - 1.030   pH, UA 7.0 5.0 - 7.5   Color, UA Yellow Yellow   Appearance Ur Clear Clear   Leukocytes,UA Negative Negative   Protein,UA Negative Negative/Trace   Glucose, UA Negative Negative   Ketones, UA Negative Negative   RBC, UA Negative Negative   Bilirubin, UA Negative Negative   Urobilinogen, Ur 0.2 0.2 - 1.0 mg/dL   Nitrite, UA Negative Negative  TSH  Result Value Ref Range   TSH 2.310 0.450 - 4.500 uIU/mL  Lipid Panel w/o Chol/HDL Ratio  Result Value Ref Range   Cholesterol, Total 192 100 - 199 mg/dL   Triglycerides 133 0 - 149 mg/dL   HDL 62 >39 mg/dL   VLDL Cholesterol Cal 23 5 - 40 mg/dL   LDL Chol Calc (NIH) 107 (H) 0 - 99 mg/dL  Comprehensive metabolic panel  Result Value Ref Range   Glucose 82 65 - 99 mg/dL   BUN 13 8 - 27 mg/dL   Creatinine, Ser 1.07 (H) 0.57 - 1.00 mg/dL   GFR calc non Af Amer 52 (L) >59 mL/min/1.73   GFR calc Af Amer 60 >59 mL/min/1.73   BUN/Creatinine Ratio 12 12 - 28   Sodium 141 134 - 144 mmol/L   Potassium 4.3 3.5 - 5.2 mmol/L   Chloride 102 96 - 106 mmol/L   CO2 28 20 - 29 mmol/L   Calcium 9.4 8.7 - 10.3 mg/dL   Total Protein 6.6 6.0 - 8.5 g/dL   Albumin 4.1 3.7 - 4.7 g/dL   Globulin, Total 2.5 1.5 - 4.5 g/dL   Albumin/Globulin Ratio 1.6 1.2 - 2.2   Bilirubin Total 0.3 0.0 - 1.2 mg/dL   Alkaline Phosphatase 85 39 - 117 IU/L   AST 16 0 - 40 IU/L   ALT 7 0 - 32 IU/L  CBC with Differential/Platelet  Result Value Ref Range   WBC 6.0 3.4 - 10.8 x10E3/uL   RBC 4.77 3.77 - 5.28 x10E6/uL   Hemoglobin 12.8 11.1 -  15.9 g/dL   Hematocrit 38.7 34.0 - 46.6 %   MCV 81 79 - 97 fL   MCH 26.8 26.6 - 33.0 pg   MCHC 33.1  31.5 - 35.7 g/dL   RDW 13.8 11.7 - 15.4 %   Platelets 355 150 - 450 x10E3/uL   Neutrophils 63 Not Estab. %   Lymphs 24 Not Estab. %   Monocytes 7 Not Estab. %   Eos 5 Not Estab. %   Basos 1 Not Estab. %   Neutrophils Absolute 3.8 1.4 - 7.0 x10E3/uL   Lymphocytes Absolute 1.5 0.7 - 3.1 x10E3/uL   Monocytes Absolute 0.4 0.1 - 0.9 x10E3/uL   EOS (ABSOLUTE) 0.3 0.0 - 0.4 x10E3/uL   Basophils Absolute 0.0 0.0 - 0.2 x10E3/uL   Immature Granulocytes 0 Not Estab. %   Immature Grans (Abs) 0.0 0.0 - 0.1 x10E3/uL      Assessment & Plan:   Problem List Items Addressed This Visit    None    Visit Diagnoses    Strain of abdominal muscle, initial encounter    -  Primary   Will treat with toradol, flexeril and start naproxen tomorrow. Call if not getting better by Monday. Continue to monitor.    Relevant Medications   ketorolac (TORADOL) injection 60 mg (Start on 03/05/2020 10:30 AM)   Flank pain       Normal urine.    Relevant Orders   UA/M w/rflx Culture, Routine       Follow up plan: Return if symptoms worsen or fail to improve.

## 2020-03-11 ENCOUNTER — Telehealth: Payer: Self-pay | Admitting: Family Medicine

## 2020-03-11 NOTE — Telephone Encounter (Signed)
-----   Message from Valerie Roys, DO sent at 02/26/2020 11:36 AM EST ----- Check in to see how she's feeling back on the atorvastatin

## 2020-03-11 NOTE — Telephone Encounter (Signed)
Patient states that she is doing well. 

## 2020-03-11 NOTE — Telephone Encounter (Signed)
Can we check on how she's feeling?

## 2020-05-02 NOTE — Progress Notes (Signed)
BP 133/83 (BP Location: Left Arm, Patient Position: Sitting, Cuff Size: Normal)   Pulse 96   Temp 98.7 F (37.1 C) (Oral)   Ht 5' 2.5" (1.588 m)   Wt 156 lb 12.8 oz (71.1 kg)   LMP 07/31/1981 (Approximate)   SpO2 98%   BMI 28.22 kg/m    Subjective:    Patient ID: Nancy Mack, female    DOB: 1946-08-01, 74 y.o.   MRN: [id number]  HPI: Nancy Mack is a 74 y.o. female presenting on 05/03/2020 for comprehensive medical examination. Current medical complaints include:  HYPERTENSION / HYPERLIPIDEMIA Satisfied with current treatment? yes Duration of hypertension: chronic BP monitoring frequency: not checking BP medication side effects: no Duration of hyperlipidemia: chronic Cholesterol medication side effects: no Cholesterol supplements: none Past cholesterol medications: atorvastatin Medication compliance: excellent compliance Aspirin: yes Recent stressors: no Recurrent headaches: no Visual changes: no Palpitations: no Dyspnea: no Chest pain: no Lower extremity edema: no Dizzy/lightheaded: no  GERD GERD control status: controlled  Satisfied with current treatment? yes Heartburn frequency: rarely Medication side effects: no  Medication compliance: good Dysphagia: no Odynophagia:  no Hematemesis: no Blood in stool: no EGD: no  Menopausal Symptoms: no  Functional Status Survey: Is the patient deaf or have difficulty hearing?: No Does the patient have difficulty seeing, even when wearing glasses/contacts?: No Does the patient have difficulty concentrating, remembering, or making decisions?: No Does the patient have difficulty walking or climbing stairs?: No Does the patient have difficulty dressing or bathing?: No Does the patient have difficulty doing errands alone such as visiting a doctor's office or shopping?: No  Fall Risk  05/03/2020 02/23/2020 04/17/2019 02/20/2019 02/18/2018  Falls in the past year? 0 0 0 0 No  Number falls in past yr: 0 0 0 - -    Injury with Fall? 0 0 0 - -    Depression Screen Depression screen [facility] 2/9 05/03/2020 02/23/2020 02/20/2019 02/18/2018 01/25/2018  Decreased Interest 0 0 0 0 0  Down, Depressed, Hopeless 0 0 0 0 0  PHQ - 2 Score 0 0 0 0 0  Altered sleeping 0 - - 0 0  Tired, decreased energy 0 - - 0 0  Change in appetite 0 - - 0 0  Feeling bad or failure about yourself  0 - - 0 0  Trouble concentrating 0 - - 0 0  Moving slowly or fidgety/restless 0 - - 0 0  Suicidal thoughts 0 - - 0 0  PHQ-9 Score 0 - - 0 0  Difficult doing work/chores Not difficult at all - - - -   Advanced Directives Does patient have a HCPOA?    yes Does patient have a living will or MOST form?  yes  Past Medical History:  Past Medical History:  Diagnosis Date  . Allergy   . Asthma   . Carpal tunnel syndrome   . Eczema   . Globus pharyngeus   . Hyperlipidemia   . Hypertension   . Laryngopharyngeal reflux   . Menopause   . Neuroma    right foot  . Osteoporosis   . Raynaud's disease   . Rhinitis due to pollen   . SOB (shortness of breath)     Surgical History:  Past Surgical History:  Procedure Laterality Date  . COLONOSCOPY WITH PROPOFOL N/A 03/04/2019   Procedure: COLONOSCOPY WITH PROPOFOL;  Surgeon: [name], MD;  Location: [facility] ENDOSCOPY;  Service: Endoscopy;  Laterality: N/A;  . EYE SURGERY Left 2015  broken blood veesel, Dr.Matthews   . EYE SURGERY Bilateral 2017   In [place]   . FOOT NEUROMA SURGERY Right   . PARTIAL HYSTERECTOMY    . SHOULDER ARTHROSCOPY WITH BICEPSTENOTOMY Right 04/08/2018   Procedure: SHOULDER ARTHROSCOPY WITH BICEPSTENOTOMY;  Surgeon: Lovell Sheehan, MD;  Location: [facility] ORS;  Service: Orthopedics;  Laterality: Right;    Medications:  Current Outpatient Medications on File Prior to Visit  Medication Sig  . albuterol (PROAIR HFA) 108 (90 Base) MCG/ACT inhaler Inhale 2 puffs into the lungs every 6 (six) hours as needed.  . Ascorbic Acid (VITAMIN C) 100 MG tablet Take 100 mg by  mouth daily.  Marland Kitchen aspirin 81 MG tablet Take 81 mg by mouth daily.  . Azelastine HCl 137 MCG/SPRAY SOLN Place 1 spray into the nose daily.  . Cholecalciferol (VITAMIN D) 2000 UNITS tablet Take 2,000 Units by mouth daily.  . Cyanocobalamin (VITAMIN B-12 PO) Take by mouth.  . cyclobenzaprine (FLEXERIL) 10 MG tablet Take 1 tablet (10 mg total) by mouth 3 (three) times daily as needed for muscle spasms.  . fexofenadine (ALLERGY RELIEF) 180 MG tablet Take 1 tablet (180 mg total) by mouth daily.  . fluticasone (FLONASE) 50 MCG/ACT nasal spray Place 2 sprays into both nostrils daily.  . Fluticasone-Salmeterol (ADVAIR DISKUS) 500-50 MCG/DOSE AEPB Inhale 1 puff into the lungs 2 (two) times daily.  . montelukast (SINGULAIR) 10 MG tablet Take 10 mg by mouth at bedtime.  . naproxen (NAPROSYN) 500 MG tablet Take 1 tablet (500 mg total) by mouth 2 (two) times daily with a meal.   No current facility-administered medications on file prior to visit.    Allergies:  Allergies  Allergen Reactions  . Azithromycin Rash  . Prednisone Palpitations and Other (See Comments)    increased heartrate  . Tape Rash    Adhesive tape    Social History:  Social History   Socioeconomic History  . Marital status: Married    Spouse name: Not on file  . Number of children: Not on file  . Years of education: Not on file  . Highest education level: Bachelor's degree (e.g., BA, AB, BS)  Occupational History  . Occupation: retired  Tobacco Use  . Smoking status: Former Smoker    Packs/day: 2.00    Years: 2.00    Pack years: 4.00    Types: Cigarettes    Quit date: 12/18/1978    Years since quitting: 41.4  . Smokeless tobacco: Never Used  . Tobacco comment: quit in 1980  Substance and Sexual Activity  . Alcohol use: Not Currently    Alcohol/week: 0.0 standard drinks    Comment: wine on special occasions  . Drug use: No  . Sexual activity: Yes  Other Topics Concern  . Not on file  Social History Narrative    Attends church   Social Determinants of Health   Financial Resource Strain:   . Difficulty of Paying Living Expenses:   Food Insecurity:   . Worried About Charity fundraiser in the Last Year:   . Arboriculturist in the Last Year:   Transportation Needs:   . Film/video editor (Medical):   Marland Kitchen Lack of Transportation (Non-Medical):   Physical Activity:   . Days of Exercise per Week:   . Minutes of Exercise per Session:   Stress:   . Feeling of Stress :   Social Connections:   . Frequency of Communication with Friends and Family:   .  Frequency of Social Gatherings with Friends and Family:   . Attends Religious Services:   . Active Member of Clubs or Organizations:   . Attends Archivist Meetings:   Marland Kitchen Marital Status:   Intimate Partner Violence:   . Fear of Current or Ex-Partner:   . Emotionally Abused:   Marland Kitchen Physically Abused:   . Sexually Abused:    Social History   Tobacco Use  Smoking Status Former Smoker  . Packs/day: 2.00  . Years: 2.00  . Pack years: 4.00  . Types: Cigarettes  . Quit date: 12/18/1978  . Years since quitting: 41.4  Smokeless Tobacco Never Used  Tobacco Comment   quit in 1980   Social History   Substance and Sexual Activity  Alcohol Use Not Currently  . Alcohol/week: 0.0 standard drinks   Comment: wine on special occasions    Family History:  Family History  Problem Relation Age of Onset  . Arthritis Mother   . Heart failure Mother   . Hypertension Mother   . Hyperlipidemia Mother   . Heart disease Mother   . Dementia Mother   . Asthma Father   . Aneurysm Sister   . Lung cancer Son   . Liver cancer Son   . Alzheimer's disease Maternal Grandmother     Past medical history, surgical history, medications, allergies, family history and social history reviewed with patient today and changes made to appropriate areas of the chart.   Review of Systems  Constitutional: Negative.   HENT: Positive for congestion and sore throat.  Negative for ear discharge, ear pain, hearing loss, nosebleeds, sinus pain and tinnitus.   Eyes: Negative.   Respiratory: Negative.  Negative for stridor.   Cardiovascular: Negative.   Gastrointestinal: Negative.   Genitourinary: Negative.   Musculoskeletal: Negative.   Skin: Negative.   Neurological: Positive for tingling (in her little finger on the R hand). Negative for dizziness, tremors, sensory change, speech change, focal weakness, seizures, loss of consciousness, weakness and headaches.  Endo/Heme/Allergies: Positive for environmental allergies. Negative for polydipsia. Does not bruise/bleed easily.  Psychiatric/Behavioral: Negative.     All other ROS negative except what is listed above and in the HPI.      Objective:    BP 133/83 (BP Location: Left Arm, Patient Position: Sitting, Cuff Size: Normal)   Pulse 96   Temp 98.7 F (37.1 C) (Oral)   Ht 5' 2.5" (1.588 m)   Wt 156 lb 12.8 oz (71.1 kg)   LMP 07/31/1981 (Approximate)   SpO2 98%   BMI 28.22 kg/m   Wt Readings from Last 3 Encounters:  05/03/20 156 lb 12.8 oz (71.1 kg)  03/05/20 157 lb (71.2 kg)  02/26/20 154 lb (69.9 kg)    Physical Exam Vitals and nursing note reviewed.  Constitutional:      General: She is not in acute distress.    Appearance: Normal appearance. She is not ill-appearing, toxic-appearing or diaphoretic.  HENT:     Head: Normocephalic and atraumatic.     Right Ear: Tympanic membrane, ear canal and external ear normal. There is no impacted cerumen.     Left Ear: Tympanic membrane, ear canal and external ear normal. There is no impacted cerumen.     Nose: Nose normal. No congestion or rhinorrhea.     Mouth/Throat:     Mouth: Mucous membranes are moist.     Pharynx: Oropharynx is clear. No oropharyngeal exudate or posterior oropharyngeal erythema.  Eyes:     General:  No scleral icterus.       Right eye: No discharge.        Left eye: No discharge.     Extraocular Movements: Extraocular  movements intact.     Conjunctiva/sclera: Conjunctivae normal.     Pupils: Pupils are equal, round, and reactive to light.  Neck:     Vascular: No carotid bruit.  Cardiovascular:     Rate and Rhythm: Normal rate and regular rhythm.     Pulses: Normal pulses.     Heart sounds: No murmur. No friction rub. No gallop.   Pulmonary:     Effort: Pulmonary effort is normal. No respiratory distress.     Breath sounds: Normal breath sounds. No stridor. No wheezing, rhonchi or rales.  Chest:     Chest wall: No tenderness.  Abdominal:     General: Abdomen is flat. Bowel sounds are normal. There is no distension.     Palpations: Abdomen is soft. There is no mass.     Tenderness: There is no abdominal tenderness. There is no right CVA tenderness, left CVA tenderness, guarding or rebound.     Hernia: No hernia is present.  Genitourinary:    Comments: Breast and pelvic exams deferred with shared decision making Musculoskeletal:        General: No swelling, tenderness, deformity or signs of injury.     Cervical back: Normal range of motion and neck supple. No rigidity. No muscular tenderness.     Right lower leg: No edema.     Left lower leg: No edema.  Lymphadenopathy:     Cervical: No cervical adenopathy.  Skin:    General: Skin is warm and dry.     Capillary Refill: Capillary refill takes less than 2 seconds.     Coloration: Skin is not jaundiced or pale.     Findings: No bruising, erythema, lesion or rash.     Comments: Small, well healing wound on R leg consistent with bug bite  Neurological:     General: No focal deficit present.     Mental Status: She is alert and oriented to person, place, and time. Mental status is at baseline.     Cranial Nerves: No cranial nerve deficit.     Sensory: No sensory deficit.     Motor: No weakness.     Coordination: Coordination normal.     Gait: Gait normal.     Deep Tendon Reflexes: Reflexes normal.  Psychiatric:        Mood and Affect: Mood  normal.        Behavior: Behavior normal.        Thought Content: Thought content normal.        Judgment: Judgment normal.     6CIT Screen 05/03/2020 05/03/2020 02/18/2018  What Year? 0 points 0 points 0 points  What month? 0 points 0 points 0 points  What time? 0 points 0 points 0 points  Count back from 20 0 points - 0 points  Months in reverse 0 points - 0 points  Repeat phrase 2 points - 2 points  Total Score 2 - 2      Results for orders placed or performed in visit on 03/05/20  UA/M w/rflx Culture, Routine   Specimen: Urine   URINE  Result Value Ref Range   Specific Gravity, UA 1.020 1.005 - 1.030   pH, UA 5.5 5.0 - 7.5   Color, UA Yellow Yellow   Appearance Ur Clear Clear   Leukocytes,UA  Negative Negative   Protein,UA Negative Negative/Trace   Glucose, UA Negative Negative   Ketones, UA Negative Negative   RBC, UA Negative Negative   Bilirubin, UA Negative Negative   Urobilinogen, Ur 0.2 0.2 - 1.0 mg/dL   Nitrite, UA Negative Negative      Assessment & Plan:   Problem List Items Addressed This Visit      Cardiovascular and Mediastinum   Benign essential HTN    Doing well off medicine. Continue to monitor. Call with any concerns.       Relevant Medications   atorvastatin (LIPITOR) 20 MG tablet   Other Relevant Orders   CBC with Differential/Platelet   Comprehensive metabolic panel   Microalbumin, Urine Waived   TSH   Urinalysis, Routine w reflex microscopic     Digestive   GERD (gastroesophageal reflux disease)    Under good control on current regimen. Continue current regimen. Continue to monitor. Call with any concerns.        Relevant Orders   CBC with Differential/Platelet   Comprehensive metabolic panel     Other   Hyperlipidemia    Under good control on current regimen. Continue current regimen. Continue to monitor. Call with any concerns. Refills given. Labs drawn today.       Relevant Medications   atorvastatin (LIPITOR) 20 MG tablet    Other Relevant Orders   CBC with Differential/Platelet   Comprehensive metabolic panel   Lipid Panel w/o Chol/HDL Ratio   Advanced care planning/counseling discussion    A voluntary discussion about advance care planning including the explanation and discussion of advance directives was extensively discussed  with the patient for 3 minutes with patient present.  Explanation about the health care proxy and Living will was reviewed and packet with forms with explanation of how to fill them out was given.  During this discussion, the patient was able to identify a health care proxy as her husband and has filled out the paperwork required- she will bring in a copy.  Patient was offered a separate [place] visit for further assistance with forms.         Vitamin D deficiency    Rechecking labs today. Await results. Treat as needed.       Relevant Orders   VITAMIN D 25 Hydroxy (Vit-D Deficiency, Fractures)    Other Visit Diagnoses    Encounter for Medicare annual wellness exam    -  Primary   Preventative care discussed today as below.    Routine general medical examination at a health care facility       Vaccines updated. Screening labs checked today. Mammogram and DEXA ordered. Colonoscopy up to date. Continue diet and exercise. Call with any concerns.    Open wound of right lower leg, initial encounter       Consistent with bug bite. Healing well. Due for Td. Given today.   Screening for osteoporosis       Relevant Orders   DG Bone Density   Encounter for screening mammogram for malignant neoplasm of breast       Relevant Orders   MM 3D SCREEN BREAST BILATERAL       Preventative Services:  Health Risk Assessment and Personalized Prevention Plan: Done today Bone Mass Measurements: Ordered today Breast Cancer Screening: Due in July- ordered today CVD Screening: Done today Cervical Cancer Screening: N/A Colon Cancer Screening: Up to date Depression Screening: Done  today Diabetes Screening: Done today Glaucoma Screening: See your eye  doctor Hepatitis B vaccine: N/A Hepatitis C screening: Up to date HIV Screening: Up to date Flu Vaccine: Get in the Fall Lung cancer Screening: N/A Obesity Screening: done today Pneumonia Vaccines (2): up to date STI Screening: N/A  Follow up plan: Return in about 6 months (around 11/03/2020).   LABORATORY TESTING:  - Pap smear: not applicable  IMMUNIZATIONS:   - Tdap: Tetanus vaccination status reviewed: Td vaccination indicated and given today. - Influenza: Up to date - Pneumovax: Up to date - Prevnar: Up to date   SCREENING: -Mammogram: Ordered today  - Colonoscopy: Up to date  - Bone Density: Ordered today    PATIENT COUNSELING:   Advised to take 1 mg of folate supplement per day if capable of pregnancy.   Sexuality: Discussed sexually transmitted diseases, partner selection, use of condoms, avoidance of unintended pregnancy  and contraceptive alternatives.   Advised to avoid cigarette smoking.  I discussed with the patient that most people either abstain from alcohol or drink within safe limits (<=14/week and <=4 drinks/occasion for males, <=7/weeks and <= 3 drinks/occasion for females) and that the risk for alcohol disorders and other health effects rises proportionally with the number of drinks per week and how often a drinker exceeds daily limits.  Discussed cessation/primary prevention of drug use and availability of treatment for abuse.   Diet: Encouraged to adjust caloric intake to maintain  or achieve ideal body weight, to reduce intake of dietary saturated fat and total fat, to limit sodium intake by avoiding high sodium foods and not adding table salt, and to maintain adequate dietary potassium and calcium preferably from fresh fruits, vegetables, and low-fat dairy products.    stressed the importance of regular exercise  Injury prevention: Discussed safety belts, safety helmets, smoke  detector, smoking near bedding or upholstery.   Dental health: Discussed importance of regular tooth brushing, flossing, and dental visits.    NEXT PREVENTATIVE PHYSICAL DUE IN 1 YEAR. Return in about 6 months (around 11/03/2020).

## 2020-05-03 ENCOUNTER — Other Ambulatory Visit: Payer: Self-pay

## 2020-05-03 ENCOUNTER — Ambulatory Visit (INDEPENDENT_AMBULATORY_CARE_PROVIDER_SITE_OTHER): Payer: Medicare PPO | Admitting: Family Medicine

## 2020-05-03 ENCOUNTER — Encounter: Payer: Self-pay | Admitting: Family Medicine

## 2020-05-03 VITALS — BP 133/83 | HR 96 | Temp 98.7°F | Ht 62.5 in | Wt 156.8 lb

## 2020-05-03 DIAGNOSIS — E782 Mixed hyperlipidemia: Secondary | ICD-10-CM | POA: Diagnosis not present

## 2020-05-03 DIAGNOSIS — E559 Vitamin D deficiency, unspecified: Secondary | ICD-10-CM | POA: Diagnosis not present

## 2020-05-03 DIAGNOSIS — Z13820 Encounter for screening for osteoporosis: Secondary | ICD-10-CM

## 2020-05-03 DIAGNOSIS — I10 Essential (primary) hypertension: Secondary | ICD-10-CM

## 2020-05-03 DIAGNOSIS — S81801A Unspecified open wound, right lower leg, initial encounter: Secondary | ICD-10-CM | POA: Diagnosis not present

## 2020-05-03 DIAGNOSIS — K219 Gastro-esophageal reflux disease without esophagitis: Secondary | ICD-10-CM | POA: Diagnosis not present

## 2020-05-03 DIAGNOSIS — Z1231 Encounter for screening mammogram for malignant neoplasm of breast: Secondary | ICD-10-CM

## 2020-05-03 DIAGNOSIS — Z0000 Encounter for general adult medical examination without abnormal findings: Secondary | ICD-10-CM

## 2020-05-03 DIAGNOSIS — Z23 Encounter for immunization: Secondary | ICD-10-CM | POA: Diagnosis not present

## 2020-05-03 DIAGNOSIS — Z7189 Other specified counseling: Secondary | ICD-10-CM

## 2020-05-03 LAB — MICROALBUMIN, URINE WAIVED
Creatinine, Urine Waived: 100 mg/dL (ref 10–300)
Microalb, Ur Waived: 10 mg/L (ref 0–19)
Microalb/Creat Ratio: <30 mg/g (ref <30)

## 2020-05-03 LAB — URINALYSIS, ROUTINE W REFLEX MICROSCOPIC
Bilirubin, UA: NEGATIVE
Glucose, UA: NEGATIVE
Ketones, UA: NEGATIVE
Leukocytes,UA: NEGATIVE
Nitrite, UA: NEGATIVE
Protein,UA: NEGATIVE
RBC, UA: NEGATIVE
Specific Gravity, UA: 1.015 (ref 1.005–1.030)
Urobilinogen, Ur: 0.2 mg/dL (ref 0.2–1.0)
pH, UA: 5 (ref 5.0–7.5)

## 2020-05-03 MED ORDER — ATORVASTATIN CALCIUM 20 MG PO TABS: 20.0000 mg | ORAL_TABLET | Freq: Every day | ORAL | 1 refills | Status: DC

## 2020-05-03 NOTE — Assessment & Plan Note (Signed)
A voluntary discussion about advance care planning including the explanation and discussion of advance directives was extensively discussed  with the patient for 3 minutes with patient present.  Explanation about the health care proxy and Living will was reviewed and packet with forms with explanation of how to fill them out was given.  During this discussion, the patient was able to identify a health care proxy as her husband and has filled out the paperwork required- she will bring in a copy.  Patient was offered a separate [place] visit for further assistance with forms.

## 2020-05-03 NOTE — Assessment & Plan Note (Signed)
Under good control on current regimen. Continue current regimen. Continue to monitor. Call with any concerns. Refills given. Labs drawn today.   

## 2020-05-03 NOTE — Patient Instructions (Addendum)
Call to schedule your mammogram and bone density: [facility]  Address: [address] [place], [place],  [address]  Phone: [phone]   Preventative Services:  Health Risk Assessment and Personalized Prevention Plan: Done today Bone Mass Measurements: Ordered today Breast Cancer Screening: Due in July- ordered today CVD Screening: Done today Cervical Cancer Screening: N/A Colon Cancer Screening: Up to date Depression Screening: Done today Diabetes Screening: Done today Glaucoma Screening: See your eye doctor Hepatitis B vaccine: N/A Hepatitis C screening: Up to date HIV Screening: Up to date Flu Vaccine: Get in the Fall Lung cancer Screening: N/A Obesity Screening: done today Pneumonia Vaccines (2): up to date STI Screening: N/A   Health Maintenance After Age 5 After age 62, you are at a higher risk for certain long-term diseases and infections as well as injuries from falls. Falls are a major cause of broken bones and head injuries in people who are older than age 37. Getting regular preventive care can help to keep you healthy and well. Preventive care includes getting regular testing and making lifestyle changes as recommended by your health care provider. Talk with your health care provider about:  Which screenings and tests you should have. A screening is a test that checks for a disease when you have no symptoms.  A diet and exercise plan that is right for you. What should I know about screenings and tests to prevent falls? Screening and testing are the best ways to find a health problem early. Early diagnosis and treatment give you the best chance of managing medical conditions that are common after age 48. Certain conditions and lifestyle choices may make you more likely to have a fall. Your health care provider may recommend:  Regular vision checks. Poor vision and conditions such as cataracts can make you more likely to have a  fall. If you wear glasses, make sure to get your prescription updated if your vision changes.  Medicine review. Work with your health care provider to regularly review all of the medicines you are taking, including over-the-counter medicines. Ask your health care provider about any side effects that may make you more likely to have a fall. Tell your health care provider if any medicines that you take make you feel dizzy or sleepy.  Osteoporosis screening. Osteoporosis is a condition that causes the bones to get weaker. This can make the bones weak and cause them to break more easily.  Blood pressure screening. Blood pressure changes and medicines to control blood pressure can make you feel dizzy.  Strength and balance checks. Your health care provider may recommend certain tests to check your strength and balance while standing, walking, or changing positions.  Foot health exam. Foot pain and numbness, as well as not wearing proper footwear, can make you more likely to have a fall.  Depression screening. You may be more likely to have a fall if you have a fear of falling, feel emotionally low, or feel unable to do activities that you used to do.  Alcohol use screening. Using too much alcohol can affect your balance and may make you more likely to have a fall. What actions can I take to lower my risk of falls? General instructions  Talk with your health care provider about your risks for falling. Tell your health care provider if: ? You fall. Be sure to tell your health care provider about all falls, even ones that seem minor. ? You feel dizzy, sleepy, or  off-balance.  Take over-the-counter and prescription medicines only as told by your health care provider. These include any supplements.  Eat a healthy diet and maintain a healthy weight. A healthy diet includes low-fat dairy products, low-fat (lean) meats, and fiber from whole grains, beans, and lots of fruits and vegetables. Home  safety  Remove any tripping hazards, such as rugs, cords, and clutter.  Install safety equipment such as grab bars in bathrooms and safety rails on stairs.  Keep rooms and walkways well-lit. Activity   Follow a regular exercise program to stay fit. This will help you maintain your balance. Ask your health care provider what types of exercise are appropriate for you.  If you need a cane or walker, use it as recommended by your health care provider.  Wear supportive shoes that have nonskid soles. Lifestyle  Do not drink alcohol if your health care provider tells you not to drink.  If you drink alcohol, limit how much you have: ? 0-1 drink a day for women. ? 0-2 drinks a day for men.  Be aware of how much alcohol is in your drink. In the [place] one drink equals one typical bottle of beer (12 oz), one-half glass of wine (5 oz), or one shot of hard liquor (1 oz).  Do not use any products that contain nicotine or tobacco, such as cigarettes and e-cigarettes. If you need help quitting, ask your health care provider. Summary  Having a healthy lifestyle and getting preventive care can help to protect your health and wellness after age 13.  Screening and testing are the best way to find a health problem early and help you avoid having a fall. Early diagnosis and treatment give you the best chance for managing medical conditions that are more common for people who are older than age 29.  Falls are a major cause of broken bones and head injuries in people who are older than age 13. Take precautions to prevent a fall at home.  Work with your health care provider to learn what changes you can make to improve your health and wellness and to prevent falls. This information is not intended to replace advice given to you by your health care provider. Make sure you discuss any questions you have with your health care provider. Document Revised: 03/27/2019 Document Reviewed: 10/17/2017 Elsevier  Patient Education  [place] (Tetanus, Diphtheria) Vaccine: What You Need to Know 1. Why get vaccinated? Td vaccine can prevent tetanus and diphtheria. Tetanus enters the body through cuts or wounds. Diphtheria spreads from person to person.  TETANUS (T) causes painful stiffening of the muscles. Tetanus can lead to serious health problems, including being unable to open the mouth, having trouble swallowing and breathing, or death.  DIPHTHERIA (D) can lead to difficulty breathing, heart failure, paralysis, or death. 2. Td vaccine Td is only for children 7 years and older, adolescents, and adults.  Td is usually given as a booster dose every 10 years, but it can also be given earlier after a severe and dirty wound or burn. Another vaccine, called Tdap, that protects against pertussis, also known as "whooping cough," in addition to tetanus and diphtheria, may be used instead of Td.  Td may be given at the same time as other vaccines. 3. Talk with your health care provider Tell your vaccine provider if the person getting the vaccine:  Has had an allergic reaction after a previous dose of any vaccine that protects against tetanus or diphtheria,  or has any severe, life-threatening allergies.  Has ever had Guillain-Barr Syndrome (also called GBS).  Has had severe pain or swelling after a previous dose of any vaccine that protects against tetanus or diphtheria. In some cases, your health care provider may decide to postpone Td vaccination to a future visit.  People with minor illnesses, such as a cold, may be vaccinated. People who are moderately or severely ill should usually wait until they recover before getting Td vaccine.  Your health care provider can give you more information. 4. Risks of a vaccine reaction  Pain, redness, or swelling where the shot was given, mild fever, headache, feeling tired, and nausea, vomiting, diarrhea, or stomachache sometimes happen after Td  vaccine. People sometimes faint after medical procedures, including vaccination. Tell your provider if you feel dizzy or have vision changes or ringing in the ears.  As with any medicine, there is a very remote chance of a vaccine causing a severe allergic reaction, other serious injury, or death. 5. What if there is a serious problem? An allergic reaction could occur after the vaccinated person leaves the clinic. If you see signs of a severe allergic reaction (hives, swelling of the face and throat, difficulty breathing, a fast heartbeat, dizziness, or weakness), call 9-1-1 and get the person to the nearest hospital.  For other signs that concern you, call your health care provider.  Adverse reactions should be reported to the Vaccine Adverse Event Reporting System (VAERS). Your health care provider will usually file this report, or you can do it yourself. Visit the VAERS website at www.vaers.SamedayNews.es or call 3027804055. VAERS is only for reporting reactions, and VAERS staff do not give medical advice. 6. The National Vaccine Injury Compensation Program The [employer] Vaccine Injury Compensation Program (VICP) is a federal program that was created to compensate people who may have been injured by certain vaccines. Visit the VICP website at GoldCloset.com.ee or call 2048381939 to learn about the program and about filing a claim. There is a time limit to file a claim for compensation. 7. How can I learn more?  Ask your health care provider.  Call your local or state health department.  Contact the Centers for Disease Control and Prevention (CDC): ? Call 601-027-0643 (1-800-CDC-INFO) or ? Visit CDC's website at http://hunter.com/ Vaccine Information Statement Td Vaccine (03/19/19) This information is not intended to replace advice given to you by your health care provider. Make sure you discuss any questions you have with your health care provider. Document Revised:  04/28/2019 Document Reviewed: 03/31/2019 Elsevier Patient Education  [place].

## 2020-05-03 NOTE — Assessment & Plan Note (Signed)
Doing well off medicine. Continue to monitor. Call with any concerns.  

## 2020-05-03 NOTE — Assessment & Plan Note (Signed)
Under good control on current regimen. Continue current regimen. Continue to monitor. Call with any concerns.   

## 2020-05-03 NOTE — Assessment & Plan Note (Signed)
Rechecking labs today. Await results. Treat as needed.  °

## 2020-05-04 ENCOUNTER — Encounter: Payer: Self-pay | Admitting: Family Medicine

## 2020-05-04 LAB — CBC WITH DIFFERENTIAL/PLATELET
Basophils Absolute: 0 10*3/uL (ref 0.0–0.2)
Basos: 1 %
EOS (ABSOLUTE): 0.3 10*3/uL (ref 0.0–0.4)
Eos: 4 %
Hematocrit: 39.4 % (ref 34.0–46.6)
Hemoglobin: 12.7 g/dL (ref 11.1–15.9)
Immature Grans (Abs): 0 10*3/uL (ref 0.0–0.1)
Immature Granulocytes: 0 %
Lymphocytes Absolute: 1.5 10*3/uL (ref 0.7–3.1)
Lymphs: 21 %
MCH: 26.7 pg (ref 26.6–33.0)
MCHC: 32.2 g/dL (ref 31.5–35.7)
MCV: 83 fL (ref 79–97)
Monocytes Absolute: 0.5 10*3/uL (ref 0.1–0.9)
Monocytes: 6 %
Neutrophils Absolute: 5 10*3/uL (ref 1.4–7.0)
Neutrophils: 68 %
Platelets: 387 10*3/uL (ref 150–450)
RBC: 4.75 x10E6/uL (ref 3.77–5.28)
RDW: 14.2 % (ref 11.7–15.4)
WBC: 7.3 10*3/uL (ref 3.4–10.8)

## 2020-05-04 LAB — COMPREHENSIVE METABOLIC PANEL
ALT: 13 IU/L (ref 0–32)
AST: 19 IU/L (ref 0–40)
Albumin/Globulin Ratio: 1.8 (ref 1.2–2.2)
Albumin: 4.1 g/dL (ref 3.7–4.7)
Alkaline Phosphatase: 100 IU/L (ref 48–121)
BUN/Creatinine Ratio: 11 — ABNORMAL LOW (ref 12–28)
BUN: 11 mg/dL (ref 8–27)
Bilirubin Total: 0.3 mg/dL (ref 0.0–1.2)
CO2: 24 mmol/L (ref 20–29)
Calcium: 9.7 mg/dL (ref 8.7–10.3)
Chloride: 102 mmol/L (ref 96–106)
Creatinine, Ser: 1.02 mg/dL — ABNORMAL HIGH (ref 0.57–1.00)
GFR calc Af Amer: 63 mL/min/{1.73_m2} (ref >59)
GFR calc non Af Amer: 55 mL/min/{1.73_m2} — ABNORMAL LOW (ref >59)
Globulin, Total: 2.3 g/dL (ref 1.5–4.5)
Glucose: 97 mg/dL (ref 65–99)
Potassium: 4.2 mmol/L (ref 3.5–5.2)
Sodium: 141 mmol/L (ref 134–144)
Total Protein: 6.4 g/dL (ref 6.0–8.5)

## 2020-05-04 LAB — TSH: TSH: 1.67 u[IU]/mL (ref 0.450–4.500)

## 2020-05-04 LAB — LIPID PANEL W/O CHOL/HDL RATIO
Cholesterol, Total: 187 mg/dL (ref 100–199)
HDL: 80 mg/dL (ref >39)
LDL Chol Calc (NIH): 89 mg/dL (ref 0–99)
Triglycerides: 100 mg/dL (ref 0–149)
VLDL Cholesterol Cal: 18 mg/dL (ref 5–40)

## 2020-05-04 LAB — VITAMIN D 25 HYDROXY (VIT D DEFICIENCY, FRACTURES): Vit D, 25-Hydroxy: 40.1 ng/mL (ref 30.0–100.0)

## 2020-06-01 DIAGNOSIS — R2231 Localized swelling, mass and lump, right upper limb: Secondary | ICD-10-CM | POA: Diagnosis not present

## 2020-06-17 DIAGNOSIS — Z1231 Encounter for screening mammogram for malignant neoplasm of breast: Secondary | ICD-10-CM | POA: Diagnosis not present

## 2020-06-17 LAB — HM MAMMOGRAPHY

## 2020-06-23 ENCOUNTER — Telehealth: Payer: Self-pay | Admitting: Family Medicine

## 2020-06-23 DIAGNOSIS — M8580 Other specified disorders of bone density and structure, unspecified site: Secondary | ICD-10-CM

## 2020-06-23 NOTE — Telephone Encounter (Signed)
Would she need an apt with pcp first?

## 2020-06-23 NOTE — Telephone Encounter (Signed)
[place] for order? Last scan was 05/2016 according to chart.

## 2020-06-23 NOTE — Telephone Encounter (Signed)
Patient was advised by [facility] that she would need order for bone density scan.

## 2020-06-23 NOTE — Telephone Encounter (Signed)
Ok to order and associate with osteopenia

## 2020-06-23 NOTE — Telephone Encounter (Signed)
Order placed

## 2020-08-05 ENCOUNTER — Telehealth: Payer: Self-pay | Admitting: Family Medicine

## 2020-08-05 DIAGNOSIS — N1831 Chronic kidney disease, stage 3a: Secondary | ICD-10-CM | POA: Insufficient documentation

## 2020-08-05 DIAGNOSIS — E782 Mixed hyperlipidemia: Secondary | ICD-10-CM | POA: Diagnosis not present

## 2020-08-05 DIAGNOSIS — I10 Essential (primary) hypertension: Secondary | ICD-10-CM | POA: Diagnosis not present

## 2020-08-05 DIAGNOSIS — R0602 Shortness of breath: Secondary | ICD-10-CM | POA: Diagnosis not present

## 2020-08-05 NOTE — Telephone Encounter (Signed)
Cardiologist stated there were some issues with Kidney function test from last labs. 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 Pt is worried about this.

## 2020-08-05 NOTE — Telephone Encounter (Signed)
Copied from [place] [phone]. Topic: General - Inquiry >> Aug 05, 2020 11:31 AM Percell Belt A wrote: Reason for CRM: pt called in and stated that she would would like to speak to someone in regards to her blood test that was done on her kidneys .  She has some question about it.  She would not give me any other info  Best number [phone]

## 2020-08-16 DIAGNOSIS — R0602 Shortness of breath: Secondary | ICD-10-CM | POA: Diagnosis not present

## 2020-08-16 DIAGNOSIS — Z01818 Encounter for other preprocedural examination: Secondary | ICD-10-CM | POA: Diagnosis not present

## 2020-08-20 NOTE — Telephone Encounter (Signed)
She has some very minor kidney disease. This is not anything to worry about and not any thing that needs to see a kidney specialist for. We'll just keep an eye on it. It is very very early.

## 2020-09-03 DIAGNOSIS — R0600 Dyspnea, unspecified: Secondary | ICD-10-CM | POA: Diagnosis not present

## 2020-09-03 DIAGNOSIS — I517 Cardiomegaly: Secondary | ICD-10-CM | POA: Diagnosis not present

## 2020-09-06 ENCOUNTER — Other Ambulatory Visit: Payer: Self-pay | Admitting: Unknown Physician Specialty

## 2020-09-06 NOTE — Telephone Encounter (Signed)
Requested Prescriptions  Pending Prescriptions Disp Refills  . azelastine (ASTELIN) 0.1 % nasal spray [Pharmacy Med Name: AZELASTINE 0.1% (137 MCG) SPRY] 90 mL 0    Sig: Place 1 spray 2 (two) times daily into both nostrils. Use in each nostril as directed     Ear, Nose, and Throat: Nasal Preparations - Antiallergy Passed - 09/06/2020  5:29 PM      Passed - Valid encounter within last 12 months    Recent Outpatient Visits          4 months ago Encounter for [employer] annual wellness exam   [place], DO   6 months ago Strain of abdominal muscle, initial encounter   [place], Irena, DO   6 months ago Benign essential HTN   [place], Megan P, DO   7 months ago Ganglion cyst of finger of right hand   [employer], Megan P, DO   10 months ago Benign essential HTN   [facility] [place], Tifton, DO      Future Appointments            In 1 month Johnson, Barb Merino, DO [place], [place]   In 5 months  [employer], [facility]

## 2020-09-08 ENCOUNTER — Other Ambulatory Visit
Admission: RE | Admit: 2020-09-08 | Discharge: 2020-09-08 | Disposition: A | Discharge status: Home / Self Care | Payer: Medicare PPO | Source: Ambulatory Visit | Attending: Pulmonary Disease | Admitting: Pulmonary Disease

## 2020-09-08 ENCOUNTER — Other Ambulatory Visit: Payer: Self-pay | Admitting: Pulmonary Disease

## 2020-09-08 ENCOUNTER — Ambulatory Visit
Admission: RE | Admit: 2020-09-08 | Discharge: 2020-09-08 | Disposition: A | Discharge status: Home / Self Care | Payer: Medicare PPO | Source: Ambulatory Visit | Attending: Pulmonary Disease | Admitting: Pulmonary Disease

## 2020-09-08 ENCOUNTER — Other Ambulatory Visit: Payer: Self-pay

## 2020-09-08 DIAGNOSIS — R0600 Dyspnea, unspecified: Secondary | ICD-10-CM

## 2020-09-08 DIAGNOSIS — R7989 Other specified abnormal findings of blood chemistry: Secondary | ICD-10-CM | POA: Diagnosis not present

## 2020-09-08 DIAGNOSIS — R0602 Shortness of breath: Secondary | ICD-10-CM | POA: Diagnosis not present

## 2020-09-08 LAB — CREATININE, SERUM
Creatinine, Ser: 1.03 mg/dL — ABNORMAL HIGH (ref 0.44–1.00)
GFR calc Af Amer: >60 mL/min (ref >60)
GFR calc non Af Amer: 54 mL/min — ABNORMAL LOW (ref >60)

## 2020-09-08 LAB — BUN: BUN: 13 mg/dL (ref 8–23)

## 2020-09-08 MED ORDER — IOHEXOL 350 MG/ML SOLN
75.0000 mL | Freq: Once | INTRAVENOUS | Status: AC | PRN
  Administered 2020-09-08: 75 mL via INTRAVENOUS

## 2020-09-14 ENCOUNTER — Ambulatory Visit (INDEPENDENT_AMBULATORY_CARE_PROVIDER_SITE_OTHER): Payer: Medicare PPO

## 2020-09-14 ENCOUNTER — Other Ambulatory Visit: Payer: Self-pay

## 2020-09-14 DIAGNOSIS — Z23 Encounter for immunization: Secondary | ICD-10-CM | POA: Diagnosis not present

## 2020-09-23 DIAGNOSIS — J8410 Pulmonary fibrosis, unspecified: Secondary | ICD-10-CM | POA: Diagnosis not present

## 2020-10-05 ENCOUNTER — Other Ambulatory Visit: Payer: Self-pay | Admitting: Family Medicine

## 2020-10-06 DIAGNOSIS — R0600 Dyspnea, unspecified: Secondary | ICD-10-CM | POA: Diagnosis not present

## 2020-10-06 DIAGNOSIS — R0602 Shortness of breath: Secondary | ICD-10-CM | POA: Diagnosis not present

## 2020-10-13 DIAGNOSIS — J849 Interstitial pulmonary disease, unspecified: Secondary | ICD-10-CM | POA: Diagnosis not present

## 2020-11-03 DIAGNOSIS — R9389 Abnormal findings on diagnostic imaging of other specified body structures: Secondary | ICD-10-CM | POA: Diagnosis not present

## 2020-11-03 DIAGNOSIS — H04122 Dry eye syndrome of left lacrimal gland: Secondary | ICD-10-CM | POA: Diagnosis not present

## 2020-11-03 DIAGNOSIS — R768 Other specified abnormal immunological findings in serum: Secondary | ICD-10-CM | POA: Diagnosis not present

## 2020-11-03 DIAGNOSIS — R700 Elevated erythrocyte sedimentation rate: Secondary | ICD-10-CM | POA: Diagnosis not present

## 2020-11-04 ENCOUNTER — Ambulatory Visit (INDEPENDENT_AMBULATORY_CARE_PROVIDER_SITE_OTHER): Payer: Medicare PPO | Admitting: Family Medicine

## 2020-11-04 ENCOUNTER — Encounter: Payer: Self-pay | Admitting: Family Medicine

## 2020-11-04 ENCOUNTER — Other Ambulatory Visit: Payer: Self-pay

## 2020-11-04 VITALS — BP 127/78 | HR 83 | Temp 98.4°F | Ht 62.13 in | Wt 152.8 lb

## 2020-11-04 DIAGNOSIS — E782 Mixed hyperlipidemia: Secondary | ICD-10-CM

## 2020-11-04 DIAGNOSIS — N1831 Chronic kidney disease, stage 3a: Secondary | ICD-10-CM | POA: Diagnosis not present

## 2020-11-04 DIAGNOSIS — E559 Vitamin D deficiency, unspecified: Secondary | ICD-10-CM

## 2020-11-04 DIAGNOSIS — K219 Gastro-esophageal reflux disease without esophagitis: Secondary | ICD-10-CM | POA: Diagnosis not present

## 2020-11-04 DIAGNOSIS — I10 Essential (primary) hypertension: Secondary | ICD-10-CM

## 2020-11-04 DIAGNOSIS — J849 Interstitial pulmonary disease, unspecified: Secondary | ICD-10-CM | POA: Diagnosis not present

## 2020-11-04 MED ORDER — HYDROCHLOROTHIAZIDE 25 MG PO TABS
25.0000 mg | ORAL_TABLET | Freq: Every day | ORAL | 1 refills | Status: DC
Start: 2020-11-04 — End: 2021-05-04

## 2020-11-04 MED ORDER — ATORVASTATIN CALCIUM 20 MG PO TABS
20.0000 mg | ORAL_TABLET | Freq: Every day | ORAL | 1 refills | Status: DC
Start: 2020-11-04 — End: 2021-05-04

## 2020-11-04 MED ORDER — FLUTICASONE-SALMETEROL 500-50 MCG/DOSE IN AEPB
INHALATION_SPRAY | RESPIRATORY_TRACT | 3 refills | Status: DC
Start: 2020-11-04 — End: 2021-12-13

## 2020-11-04 MED ORDER — ALBUTEROL SULFATE HFA 108 (90 BASE) MCG/ACT IN AERS
2.0000 | INHALATION_SPRAY | Freq: Four times a day (QID) | RESPIRATORY_TRACT | 3 refills | Status: DC | PRN
Start: 2020-11-04 — End: 2021-11-24

## 2020-11-04 NOTE — Assessment & Plan Note (Signed)
Being worked up. Continue to follow with pulmonology. Call with any concerns.

## 2020-11-04 NOTE — Assessment & Plan Note (Signed)
Rechecking labs today. Await results. Treat as needed.  °

## 2020-11-04 NOTE — Assessment & Plan Note (Signed)
Under good control on current regimen. Continue current regimen. Continue to monitor. Call with any concerns. Refills given. Labs drawn today.   

## 2020-11-04 NOTE — Patient Instructions (Signed)
Ayr gel

## 2020-11-04 NOTE — Progress Notes (Signed)
BP 127/78   Pulse 83   Temp 98.4 F (36.9 C) (Oral)   Ht 5' 2.13" (1.578 m)   Wt 152 lb 12.8 oz (69.3 kg)   LMP 07/31/1981 (Approximate)   SpO2 99%   BMI 27.83 kg/m    Subjective:    Patient ID: Nancy Mack, female    DOB: January 02, 1946, 74 y.o.   MRN: [id number]  HPI: Nancy Mack is a 74 y.o. female  Chief Complaint  Patient presents with  . Hypertension  . Gastroesophageal Reflux   HYPERTENSION / [place] Satisfied with current treatment? yes Duration of hypertension: chronic BP monitoring frequency: not checking BP medication side effects: no Past BP meds: HCTZ Duration of hyperlipidemia: chronic Cholesterol medication side effects: no Cholesterol supplements: none Past cholesterol medications: atorvastatin Medication compliance: excellent compliance Aspirin: yes Recent stressors: no Recurrent headaches: no Visual changes: no Palpitations: no Dyspnea: yes Chest pain: no Lower extremity edema: no Dizzy/lightheaded: no  GERD GERD control status: controlled  Satisfied with current treatment? yes Heartburn frequency: occasionally Medication side effects: no  Medication compliance: excellent Dysphagia: no Odynophagia:  no Hematemesis: no Blood in stool: no EGD: no   Relevant past medical, surgical, family and social history reviewed and updated as indicated. Interim medical history since our last visit reviewed. Allergies and medications reviewed and updated.  Review of Systems  Constitutional: Negative.   Respiratory: Negative.   Cardiovascular: Negative.   Gastrointestinal: Negative.   Musculoskeletal: Negative.   Psychiatric/Behavioral: Negative.     Per HPI unless specifically indicated above     Objective:    BP 127/78   Pulse 83   Temp 98.4 F (36.9 C) (Oral)   Ht 5' 2.13" (1.578 m)   Wt 152 lb 12.8 oz (69.3 kg)   LMP 07/31/1981 (Approximate)   SpO2 99%   BMI 27.83 kg/m   Wt Readings from Last 3 Encounters:    11/04/20 152 lb 12.8 oz (69.3 kg)  05/03/20 156 lb 12.8 oz (71.1 kg)  03/05/20 157 lb (71.2 kg)    Physical Exam Vitals and nursing note reviewed.  Constitutional:      General: She is not in acute distress.    Appearance: Normal appearance. She is not ill-appearing, toxic-appearing or diaphoretic.  HENT:     Head: Normocephalic and atraumatic.     Right Ear: External ear normal.     Left Ear: External ear normal.     Nose: Nose normal.     Mouth/Throat:     Mouth: Mucous membranes are moist.     Pharynx: Oropharynx is clear.  Eyes:     General: No scleral icterus.       Right eye: No discharge.        Left eye: No discharge.     Extraocular Movements: Extraocular movements intact.     Conjunctiva/sclera: Conjunctivae normal.     Pupils: Pupils are equal, round, and reactive to light.  Cardiovascular:     Rate and Rhythm: Normal rate and regular rhythm.     Pulses: Normal pulses.     Heart sounds: Normal heart sounds. No murmur heard.  No friction rub. No gallop.   Pulmonary:     Effort: Pulmonary effort is normal. No respiratory distress.     Breath sounds: Normal breath sounds. No stridor. No wheezing, rhonchi or rales.  Chest:     Chest wall: No tenderness.  Musculoskeletal:        General: Normal range of motion.  Cervical back: Normal range of motion and neck supple.  Skin:    General: Skin is warm and dry.     Capillary Refill: Capillary refill takes less than 2 seconds.     Coloration: Skin is not jaundiced or pale.     Findings: No bruising, erythema, lesion or rash.  Neurological:     General: No focal deficit present.     Mental Status: She is alert and oriented to person, place, and time. Mental status is at baseline.  Psychiatric:        Mood and Affect: Mood normal.        Behavior: Behavior normal.        Thought Content: Thought content normal.        Judgment: Judgment normal.     Results for orders placed or performed during the hospital  encounter of 09/08/20  BUN  Result Value Ref Range   BUN 13 8 - 23 mg/dL  Creatinine, serum  Result Value Ref Range   Creatinine, Ser 1.03 (H) 0.44 - 1.00 mg/dL   GFR calc non Af Amer 54 (L) >60 mL/min   GFR calc Af Amer >60 >60 mL/min      Assessment & Plan:   Problem List Items Addressed This Visit      Cardiovascular and Mediastinum   Benign essential HTN    Under good control on current regimen. Continue current regimen. Continue to monitor. Call with any concerns. Refills given. Labs drawn today.        Relevant Medications   hydrochlorothiazide (HYDRODIURIL) 25 MG tablet   atorvastatin (LIPITOR) 20 MG tablet   Other Relevant Orders   Comprehensive metabolic panel     Respiratory   ILD (interstitial lung disease) ([place])    Being worked up. Continue to follow with pulmonology. Call with any concerns.         Digestive   GERD (gastroesophageal reflux disease)    Under good control on current regimen. Continue current regimen. Continue to monitor. Call with any concerns. Refills given. Labs drawn today.       Relevant Medications   omeprazole (PRILOSEC) 20 MG capsule   Other Relevant Orders   CBC with Differential/Platelet     Genitourinary   Stage 3a chronic kidney disease ([place])    Rechecking labs today. Await results. Treat as needed.         Other   Hyperlipidemia - Primary    Under good control on current regimen. Continue current regimen. Continue to monitor. Call with any concerns. Refills given. Labs drawn today.       Relevant Medications   hydrochlorothiazide (HYDRODIURIL) 25 MG tablet   atorvastatin (LIPITOR) 20 MG tablet   Other Relevant Orders   Comprehensive metabolic panel   Lipid Panel w/o Chol/HDL Ratio   Vitamin D deficiency    Rechecking labs today. Await results. Treat as needed.       Relevant Orders   VITAMIN D 25 Hydroxy (Vit-D Deficiency, Fractures)       Follow up plan: Return in about 6 months (around 05/04/2021) for  Physical.

## 2020-11-05 ENCOUNTER — Encounter: Payer: Self-pay | Admitting: Family Medicine

## 2020-11-05 LAB — LIPID PANEL W/O CHOL/HDL RATIO
Cholesterol, Total: 195 mg/dL (ref 100–199)
HDL: 66 mg/dL (ref >39)
LDL Chol Calc (NIH): 106 mg/dL — ABNORMAL HIGH (ref 0–99)
Triglycerides: 133 mg/dL (ref 0–149)
VLDL Cholesterol Cal: 23 mg/dL (ref 5–40)

## 2020-11-05 LAB — COMPREHENSIVE METABOLIC PANEL
ALT: 32 IU/L (ref 0–32)
AST: 36 IU/L (ref 0–40)
Albumin/Globulin Ratio: 1.6 (ref 1.2–2.2)
Albumin: 4.4 g/dL (ref 3.7–4.7)
Alkaline Phosphatase: 119 IU/L (ref 44–121)
BUN/Creatinine Ratio: 10 — ABNORMAL LOW (ref 12–28)
BUN: 10 mg/dL (ref 8–27)
Bilirubin Total: 0.4 mg/dL (ref 0.0–1.2)
CO2: 26 mmol/L (ref 20–29)
Calcium: 10 mg/dL (ref 8.7–10.3)
Chloride: 101 mmol/L (ref 96–106)
Creatinine, Ser: 1.04 mg/dL — ABNORMAL HIGH (ref 0.57–1.00)
GFR calc Af Amer: 62 mL/min/{1.73_m2} (ref >59)
GFR calc non Af Amer: 53 mL/min/{1.73_m2} — ABNORMAL LOW (ref >59)
Globulin, Total: 2.8 g/dL (ref 1.5–4.5)
Glucose: 105 mg/dL — ABNORMAL HIGH (ref 65–99)
Potassium: 4.6 mmol/L (ref 3.5–5.2)
Sodium: 141 mmol/L (ref 134–144)
Total Protein: 7.2 g/dL (ref 6.0–8.5)

## 2020-11-05 LAB — CBC WITH DIFFERENTIAL/PLATELET
Basophils Absolute: 0.1 10*3/uL (ref 0.0–0.2)
Basos: 1 %
EOS (ABSOLUTE): 0.5 10*3/uL — ABNORMAL HIGH (ref 0.0–0.4)
Eos: 6 %
Hematocrit: 40.1 % (ref 34.0–46.6)
Hemoglobin: 12.9 g/dL (ref 11.1–15.9)
Immature Grans (Abs): 0 10*3/uL (ref 0.0–0.1)
Immature Granulocytes: 0 %
Lymphocytes Absolute: 1.3 10*3/uL (ref 0.7–3.1)
Lymphs: 15 %
MCH: 26.2 pg — ABNORMAL LOW (ref 26.6–33.0)
MCHC: 32.2 g/dL (ref 31.5–35.7)
MCV: 81 fL (ref 79–97)
Monocytes Absolute: 0.7 10*3/uL (ref 0.1–0.9)
Monocytes: 8 %
Neutrophils Absolute: 6.2 10*3/uL (ref 1.4–7.0)
Neutrophils: 70 %
Platelets: 360 10*3/uL (ref 150–450)
RBC: 4.93 x10E6/uL (ref 3.77–5.28)
RDW: 14.7 % (ref 11.7–15.4)
WBC: 8.7 10*3/uL (ref 3.4–10.8)

## 2020-11-05 LAB — VITAMIN D 25 HYDROXY (VIT D DEFICIENCY, FRACTURES): Vit D, 25-Hydroxy: 37.7 ng/mL (ref 30.0–100.0)

## 2020-11-09 DIAGNOSIS — Z23 Encounter for immunization: Secondary | ICD-10-CM | POA: Diagnosis not present

## 2020-11-10 DIAGNOSIS — R682 Dry mouth, unspecified: Secondary | ICD-10-CM | POA: Diagnosis not present

## 2020-12-02 ENCOUNTER — Other Ambulatory Visit: Payer: Self-pay | Admitting: Otolaryngology

## 2020-12-02 DIAGNOSIS — K1123 Chronic sialoadenitis: Secondary | ICD-10-CM | POA: Diagnosis not present

## 2020-12-13 DIAGNOSIS — K1123 Chronic sialoadenitis: Secondary | ICD-10-CM | POA: Diagnosis not present

## 2020-12-15 LAB — SURGICAL PATHOLOGY

## 2020-12-22 ENCOUNTER — Other Ambulatory Visit: Payer: Self-pay | Admitting: Family Medicine

## 2021-01-21 DIAGNOSIS — E782 Mixed hyperlipidemia: Secondary | ICD-10-CM | POA: Diagnosis not present

## 2021-01-21 DIAGNOSIS — R0602 Shortness of breath: Secondary | ICD-10-CM | POA: Diagnosis not present

## 2021-01-21 DIAGNOSIS — I700 Atherosclerosis of aorta: Secondary | ICD-10-CM | POA: Diagnosis not present

## 2021-01-21 DIAGNOSIS — I10 Essential (primary) hypertension: Secondary | ICD-10-CM | POA: Diagnosis not present

## 2021-01-21 DIAGNOSIS — N1831 Chronic kidney disease, stage 3a: Secondary | ICD-10-CM | POA: Diagnosis not present

## 2021-02-24 ENCOUNTER — Ambulatory Visit: Payer: Medicare PPO

## 2021-03-03 DIAGNOSIS — R768 Other specified abnormal immunological findings in serum: Secondary | ICD-10-CM | POA: Diagnosis not present

## 2021-03-03 DIAGNOSIS — R0602 Shortness of breath: Secondary | ICD-10-CM | POA: Diagnosis not present

## 2021-03-04 ENCOUNTER — Ambulatory Visit (INDEPENDENT_AMBULATORY_CARE_PROVIDER_SITE_OTHER): Payer: Medicare Other

## 2021-03-04 VITALS — Ht 62.5 in | Wt 150.0 lb

## 2021-03-04 DIAGNOSIS — Z0000 Encounter for general adult medical examination without abnormal findings: Secondary | ICD-10-CM | POA: Diagnosis not present

## 2021-03-04 NOTE — Patient Instructions (Signed)
Ms. Nancy Mack , Thank you for taking time to come for your Medicare Wellness Visit. I appreciate your ongoing commitment to your health goals. Please review the following plan we discussed and let me know if I can assist you in the future.   Screening recommendations/referrals: Colonoscopy: completed 03/04/2019 Mammogram: completed 06/17/2020 Bone Density: completed 05/18/2016 Recommended yearly ophthalmology/optometry visit for glaucoma screening and checkup Recommended yearly dental visit for hygiene and checkup  Vaccinations: Influenza vaccine: completed 09/14/2020, due 07/18/2021 Pneumococcal vaccine: completed 01/22/2015 Tdap vaccine: completed 05/03/2020, due 05/03/2030 Shingles vaccine: completed   Covid-19: 11/09/2020, 02/25/2020, 01/26/2020  Advanced directives: Advance directive discussed with you today.  Conditions/risks identified: none  Next appointment: Follow up in one year for your annual wellness visit    Preventive Care 65 Years and Older, Female Preventive care refers to lifestyle choices and visits with your health care provider that can promote health and wellness. What does preventive care include?  A yearly physical exam. This is also called an annual well check.  Dental exams once or twice a year.  Routine eye exams. Ask your health care provider how often you should have your eyes checked.  Personal lifestyle choices, including:  Daily care of your teeth and gums.  Regular physical activity.  Eating a healthy diet.  Avoiding tobacco and drug use.  Limiting alcohol use.  Practicing safe sex.  Taking low-dose aspirin every day.  Taking vitamin and mineral supplements as recommended by your health care provider. What happens during an annual well check? The services and screenings done by your health care provider during your annual well check will depend on your age, overall health, lifestyle risk factors, and family history of disease. Counseling  Your  health care provider may ask you questions about your:  Alcohol use.  Tobacco use.  Drug use.  Emotional well-being.  Home and relationship well-being.  Sexual activity.  Eating habits.  History of falls.  Memory and ability to understand (cognition).  Work and work Statistician.  Reproductive health. Screening  You may have the following tests or measurements:  Height, weight, and BMI.  Blood pressure.  Lipid and cholesterol levels. These may be checked every 5 years, or more frequently if you are over 59 years old.  Skin check.  Lung cancer screening. You may have this screening every year starting at age 68 if you have a 30-pack-year history of smoking and currently smoke or have quit within the past 15 years.  Fecal occult blood test (FOBT) of the stool. You may have this test every year starting at age 40.  Flexible sigmoidoscopy or colonoscopy. You may have a sigmoidoscopy every 5 years or a colonoscopy every 10 years starting at age 28.  Hepatitis C blood test.  Hepatitis B blood test.  Sexually transmitted disease (STD) testing.  Diabetes screening. This is done by checking your blood sugar (glucose) after you have not eaten for a while (fasting). You may have this done every 1-3 years.  Bone density scan. This is done to screen for osteoporosis. You may have this done starting at age 13.  Mammogram. This may be done every 1-2 years. Talk to your health care provider about how often you should have regular mammograms. Talk with your health care provider about your test results, treatment options, and if necessary, the need for more tests. Vaccines  Your health care provider may recommend certain vaccines, such as:  Influenza vaccine. This is recommended every year.  Tetanus, diphtheria, and acellular pertussis (  Tdap, Td) vaccine. You may need a Td booster every 10 years.  Zoster vaccine. You may need this after age 23.  Pneumococcal 13-valent  conjugate (PCV13) vaccine. One dose is recommended after age 24.  Pneumococcal polysaccharide (PPSV23) vaccine. One dose is recommended after age 67. Talk to your health care provider about which screenings and vaccines you need and how often you need them. This information is not intended to replace advice given to you by your health care provider. Make sure you discuss any questions you have with your health care provider. Document Released: 12/31/2015 Document Revised: 08/23/2016 Document Reviewed: 10/05/2015 Elsevier Interactive Patient Education  2017 [place] Prevention in the Home Falls can cause injuries. They can happen to people of all ages. There are many things you can do to make your home safe and to help prevent falls. What can I do on the outside of my home?  Regularly fix the edges of walkways and driveways and fix any cracks.  Remove anything that might make you trip as you walk through a door, such as a raised step or threshold.  Trim any bushes or trees on the path to your home.  Use bright outdoor lighting.  Clear any walking paths of anything that might make someone trip, such as rocks or tools.  Regularly check to see if handrails are loose or broken. Make sure that both sides of any steps have handrails.  Any raised decks and porches should have guardrails on the edges.  Have any leaves, snow, or ice cleared regularly.  Use sand or salt on walking paths during winter.  Clean up any spills in your garage right away. This includes oil or grease spills. What can I do in the bathroom?  Use night lights.  Install grab bars by the toilet and in the tub and shower. Do not use towel bars as grab bars.  Use non-skid mats or decals in the tub or shower.  If you need to sit down in the shower, use a plastic, non-slip stool.  Keep the floor dry. Clean up any water that spills on the floor as soon as it happens.  Remove soap buildup in the tub or  shower regularly.  Attach bath mats securely with double-sided non-slip rug tape.  Do not have throw rugs and other things on the floor that can make you trip. What can I do in the bedroom?  Use night lights.  Make sure that you have a light by your bed that is easy to reach.  Do not use any sheets or blankets that are too big for your bed. They should not hang down onto the floor.  Have a firm chair that has side arms. You can use this for support while you get dressed.  Do not have throw rugs and other things on the floor that can make you trip. What can I do in the kitchen?  Clean up any spills right away.  Avoid walking on wet floors.  Keep items that you use a lot in easy-to-reach places.  If you need to reach something above you, use a strong step stool that has a grab bar.  Keep electrical cords out of the way.  Do not use floor polish or wax that makes floors slippery. If you must use wax, use non-skid floor wax.  Do not have throw rugs and other things on the floor that can make you trip. What can I do with my stairs?  Do  not leave any items on the stairs.  Make sure that there are handrails on both sides of the stairs and use them. Fix handrails that are broken or loose. Make sure that handrails are as long as the stairways.  Check any carpeting to make sure that it is firmly attached to the stairs. Fix any carpet that is loose or worn.  Avoid having throw rugs at the top or bottom of the stairs. If you do have throw rugs, attach them to the floor with carpet tape.  Make sure that you have a light switch at the top of the stairs and the bottom of the stairs. If you do not have them, ask someone to add them for you. What else can I do to help prevent falls?  Wear shoes that:  Do not have high heels.  Have rubber bottoms.  Are comfortable and fit you well.  Are closed at the toe. Do not wear sandals.  If you use a stepladder:  Make sure that it is fully  opened. Do not climb a closed stepladder.  Make sure that both sides of the stepladder are locked into place.  Ask someone to hold it for you, if possible.  Clearly mark and make sure that you can see:  Any grab bars or handrails.  First and last steps.  Where the edge of each step is.  Use tools that help you move around (mobility aids) if they are needed. These include:  Canes.  Walkers.  Scooters.  Crutches.  Turn on the lights when you go into a dark area. Replace any light bulbs as soon as they burn out.  Set up your furniture so you have a clear path. Avoid moving your furniture around.  If any of your floors are uneven, fix them.  If there are any pets around you, be aware of where they are.  Review your medicines with your doctor. Some medicines can make you feel dizzy. This can increase your chance of falling. Ask your doctor what other things that you can do to help prevent falls. This information is not intended to replace advice given to you by your health care provider. Make sure you discuss any questions you have with your health care provider. Document Released: 09/30/2009 Document Revised: 05/11/2016 Document Reviewed: 01/08/2015 Elsevier Interactive Patient Education  2017 [employer].

## 2021-03-04 NOTE — Progress Notes (Signed)
I connected with [name] today by telephone and verified that I am speaking with the correct person using two identifiers. Location patient: home Location provider: work Persons participating in the virtual visit: Korryn, Pancoast LPN.   I discussed the limitations, risks, security and privacy concerns of performing an evaluation and management service by telephone and the availability of in person appointments. I also discussed with the patient that there may be a patient responsible charge related to this service. The patient expressed understanding and verbally consented to this telephonic visit.    Interactive audio and video telecommunications were attempted between this provider and patient, however failed, due to patient having technical difficulties OR patient did not have access to video capability.  We continued and completed visit with audio only.     Vital signs may be patient reported or missing.  Subjective:   Nancy Mack is a 75 y.o. female who presents for Medicare Annual (Subsequent) preventive examination.  Review of Systems     Cardiac Risk Factors include: advanced age (>57men, >67 women);dyslipidemia;sedentary lifestyle     Objective:    Today's Vitals   03/04/21 0811  Weight: 150 lb (68 kg)  Height: 5' 2.5" (1.588 m)   Body mass index is 27 kg/m.  Advanced Directives 03/04/2021 02/23/2020 03/04/2019 02/20/2019 04/01/2018 02/18/2018 01/23/2017  Does Patient Have a Medical Advance Directive? No No No No No No No  Does patient want to make changes to medical advance directive? - - - - - Yes (MAU/Ambulatory/Procedural Areas - Information given) -  Would patient like information on creating a medical advance directive? - - - Yes (MAU/Ambulatory/Procedural Areas - Information given) No - Patient declined - -    Current Medications (verified) Outpatient Encounter Medications as of 03/04/2021  Medication Sig  . albuterol (PROAIR HFA) 108 (90  Base) MCG/ACT inhaler Inhale 2 puffs into the lungs every 6 (six) hours as needed.  . Ascorbic Acid (VITAMIN C) 100 MG tablet Take 100 mg by mouth daily.  Marland Kitchen aspirin 81 MG tablet Take 81 mg by mouth daily.  Marland Kitchen atorvastatin (LIPITOR) 20 MG tablet Take 1 tablet (20 mg total) by mouth daily.  Marland Kitchen azelastine (ASTELIN) 0.1 % nasal spray Place 1 spray 2 (two) times daily into both nostrils. Use in each nostril as directed  . Cholecalciferol (VITAMIN D) 2000 UNITS tablet Take 2,000 Units by mouth daily.  . fexofenadine (ALLERGY RELIEF) 180 MG tablet Take 1 tablet (180 mg total) by mouth daily.  . fluticasone (FLONASE) 50 MCG/ACT nasal spray Place 2 sprays into both nostrils daily.  . Fluticasone-Salmeterol (ADVAIR DISKUS) 500-50 MCG/DOSE AEPB Inhale 1 puff into the lungs 2 (two) times daily.  . hydrochlorothiazide (HYDRODIURIL) 25 MG tablet Take 1 tablet (25 mg total) by mouth daily.  . montelukast (SINGULAIR) 10 MG tablet Take 10 mg by mouth at bedtime.  Marland Kitchen omeprazole (PRILOSEC) 20 MG capsule Take by mouth.  . Cyanocobalamin (VITAMIN B-12 PO) Take by mouth. (Patient not taking: Reported on 03/04/2021)  . cyclobenzaprine (FLEXERIL) 10 MG tablet Take 1 tablet (10 mg total) by mouth 3 (three) times daily as needed for muscle spasms. (Patient not taking: Reported on 03/04/2021)  . naproxen (NAPROSYN) 500 MG tablet Take 1 tablet (500 mg total) by mouth 2 (two) times daily with a meal. (Patient not taking: No sig reported)   No facility-administered encounter medications on file as of 03/04/2021.    Allergies (verified) Azithromycin, Prednisone, and Tape   History: Past Medical  History:  Diagnosis Date  . Allergy   . Asthma   . Carpal tunnel syndrome   . Eczema   . Globus pharyngeus   . Hyperlipidemia   . Hypertension   . Laryngopharyngeal reflux   . Menopause   . Neuroma    right foot  . Osteoporosis   . Raynaud's disease   . Rhinitis due to pollen   . SOB (shortness of breath)    Past Surgical  History:  Procedure Laterality Date  . COLONOSCOPY WITH PROPOFOL N/A 03/04/2019   Procedure: COLONOSCOPY WITH PROPOFOL;  Surgeon: [name], MD;  Location: [facility] ENDOSCOPY;  Service: Endoscopy;  Laterality: N/A;  . EYE SURGERY Left 2015   broken blood veesel, Dr.Matthews   . EYE SURGERY Bilateral 2017   In [place]   . FOOT NEUROMA SURGERY Right   . PARTIAL HYSTERECTOMY    . SHOULDER ARTHROSCOPY WITH BICEPSTENOTOMY Right 04/08/2018   Procedure: SHOULDER ARTHROSCOPY WITH BICEPSTENOTOMY;  Surgeon: Lovell Sheehan, MD;  Location: [facility] ORS;  Service: Orthopedics;  Laterality: Right;   Family History  Problem Relation Age of Onset  . Arthritis Mother   . Heart failure Mother   . Hypertension Mother   . Hyperlipidemia Mother   . Heart disease Mother   . Dementia Mother   . Asthma Father   . Aneurysm Sister   . Lung cancer Son   . Liver cancer Son   . Alzheimer's disease Maternal Grandmother    Social History   Socioeconomic History  . Marital status: Married    Spouse name: Not on file  . Number of children: Not on file  . Years of education: Not on file  . Highest education level: Bachelor's degree (e.g., BA, AB, BS)  Occupational History  . Occupation: retired  Tobacco Use  . Smoking status: Former Smoker    Packs/day: 2.00    Years: 2.00    Pack years: 4.00    Types: Cigarettes    Quit date: 12/18/1978    Years since quitting: 42.2  . Smokeless tobacco: Never Used  . Tobacco comment: quit in 1980  Vaping Use  . Vaping Use: Never used  Substance and Sexual Activity  . Alcohol use: Not Currently    Alcohol/week: 0.0 standard drinks    Comment: wine on special occasions  . Drug use: No  . Sexual activity: Yes  Other Topics Concern  . Not on file  Social History Narrative   Attends church   Social Determinants of Health   Financial Resource Strain: Low Risk   . Difficulty of Paying Living Expenses: Not hard at all  Food Insecurity: No Food Insecurity  .  Worried About Charity fundraiser in the Last Year: Never true  . Ran Out of Food in the Last Year: Never true  Transportation Needs: No Transportation Needs  . Lack of Transportation (Medical): No  . Lack of Transportation (Non-Medical): No  Physical Activity: Inactive  . Days of Exercise per Week: 0 days  . Minutes of Exercise per Session: 0 min  Stress: No Stress Concern Present  . Feeling of Stress : Not at all  Social Connections: Not on file    Tobacco Counseling Counseling given: Not Answered Comment: quit in 1980   Clinical Intake:  Pre-visit preparation completed: Yes  Pain : No/denies pain     Nutritional Status: BMI 25 -29 Overweight Nutritional Risks: None Diabetes: No  How often do you need to have someone help you when  you read instructions, pamphlets, or other written materials from your doctor or pharmacy?: 1 - Never What is the last grade level you completed in school?: associates degree  Diabetic? no  Interpreter Needed?: No  Information entered by :: NAllen LPN   Activities of Daily Living In your present state of health, do you have any difficulty performing the following activities: 03/04/2021 05/03/2020  Hearing? N N  Vision? N N  Difficulty concentrating or making decisions? N N  Walking or climbing stairs? N N  Dressing or bathing? N N  Doing errands, shopping? N N  Preparing Food and eating ? N -  Using the Toilet? N -  In the past six months, have you accidently leaked urine? N -  Do you have problems with loss of bowel control? N -  Managing your Medications? N -  Managing your Finances? N -  Housekeeping or managing your Housekeeping? N -  Some recent data might be hidden    Patient Care Team: Valerie Roys, DO as PCP - General (Family Medicine) Kathrine Haddock, NP (Nurse Practitioner) [name], MD as Consulting Physician (Gastroenterology) Clyde Canterbury, MD as Referring Physician (Otolaryngology) Agapito Games  [facility])  Indicate any recent Medical Services you may have received from other than [facility] providers in the past year (date may be approximate).     Assessment:   This is a routine wellness examination for Alvia.  Hearing/Vision screen No exam data present  Dietary issues and exercise activities discussed: Current Exercise Habits: The patient does not participate in regular exercise at present  Goals    . DIET - INCREASE WATER INTAKE     Recommend drinking at least 6-8 glasses of water a day.     . Patient Stated     03/04/2021, wants to weigh 125 pounds      Depression Screen PHQ 2/9 Scores 03/04/2021 05/03/2020 02/23/2020 02/20/2019 02/18/2018 01/25/2018 01/23/2017  PHQ - 2 Score 0 0 0 0 0 0 0  PHQ- 9 Score - 0 - - 0 0 0    Fall Risk Fall Risk  03/04/2021 05/03/2020 02/23/2020 04/17/2019 02/20/2019  Falls in the past year? 0 0 0 0 0  Number falls in past yr: - 0 0 0 -  Injury with Fall? - 0 0 0 -  Risk for fall due to : Medication side effect - - - -  Follow up Falls evaluation completed;Education provided;Falls prevention discussed - - - -    FALL RISK PREVENTION PERTAINING TO THE HOME:  Any stairs in or around the home? No  If so, are there any without handrails? n/a Home free of loose throw rugs in walkways, pet beds, electrical cords, etc? Yes  Adequate lighting in your home to reduce risk of falls? Yes   ASSISTIVE DEVICES UTILIZED TO PREVENT FALLS:  Life alert? No  Use of a cane, walker or w/c? No  Grab bars in the bathroom? No  Shower chair or bench in shower? No  Elevated toilet seat or a handicapped toilet? No   TIMED UP AND GO:  Was the test performed? No .  Cognitive Function:     6CIT Screen 03/04/2021 05/03/2020 05/03/2020 02/18/2018  What Year? 0 points 0 points 0 points 0 points  What month? 0 points 0 points 0 points 0 points  What time? 0 points 0 points 0 points 0 points  Count back from 20 0 points 0 points - 0 points  Months in reverse 0 points 0  points  - 0 points  Repeat phrase 0 points 2 points - 2 points  Total Score 0 2 - 2    Immunizations Immunization History  Administered Date(s) Administered  . Fluad Quad(high Dose 65+) 09/26/2019, 09/14/2020  . Influenza, High Dose Seasonal PF 09/21/2016, 10/02/2017, 10/23/2018  . Influenza,inj,Quad PF,6+ Mos 10/01/2015  . Influenza-Unspecified 09/10/2012, 09/15/2014, 09/21/2016  . Moderna Sars-Covid-2 Vaccination 01/26/2020, 02/25/2020  . Pneumococcal Conjugate-13 01/22/2015  . Pneumococcal Polysaccharide-23 06/05/2012  . Pneumococcal-Unspecified 10/19/2014  . Td 08/26/2009, 05/03/2020  . Zoster 03/13/2007  . Zoster Recombinat (Shingrix) 08/12/2019, 11/12/2019    TDAP status: Up to date  Flu Vaccine status: Up to date  Pneumococcal vaccine status: Up to date  Covid-19 vaccine status: Completed vaccines  Qualifies for Shingles Vaccine? Yes   Zostavax completed Yes   Shingrix Completed?: Yes  Screening Tests Health Maintenance  Topic Date Due  . COVID-19 Vaccine (3 - Booster for Moderna series) 08/27/2020  . MAMMOGRAM  06/17/2022  . COLONOSCOPY (Pts 45-85yrs Insurance coverage will need to be confirmed)  03/03/2029  . TETANUS/TDAP  05/03/2030  . INFLUENZA VACCINE  Completed  . DEXA SCAN  Completed  . Hepatitis C Screening  Completed  . PNA vac Low Risk Adult  Completed  . HPV VACCINES  Aged Out    Health Maintenance  Health Maintenance Due  Topic Date Due  . COVID-19 Vaccine (3 - Booster for Moderna series) 08/27/2020    Colorectal cancer screening: Type of screening: Colonoscopy. Completed 03/04/2019. Repeat every 10 years  Mammogram status: Completed 06/17/2020. Repeat every year  Bone Density status: Completed 05/18/2016  Lung Cancer Screening: (Low Dose CT Chest recommended if Age 24-80 years, 30 pack-year currently smoking OR have quit w/in 15years.) does not qualify.   Lung Cancer Screening Referral: no  Additional Screening:  Hepatitis C Screening: does  qualify; Completed 11/05/2015  Vision Screening: Recommended annual ophthalmology exams for early detection of glaucoma and other disorders of the eye. Is the patient up to date with their annual eye exam?  No  Who is the provider or what is the name of the office in which the patient attends annual eye exams? Dr. Marvel Plan If pt is not established with a provider, would they like to be referred to a provider to establish care? No .   Dental Screening: Recommended annual dental exams for proper oral hygiene  Community Resource Referral / Chronic Care Management: CRR required this visit?  No   CCM required this visit?  No      Plan:     I have personally reviewed and noted the following in the patient's chart:   . Medical and social history . Use of alcohol, tobacco or illicit drugs  . Current medications and supplements . Functional ability and status . Nutritional status . Physical activity . Advanced directives . List of other physicians . Hospitalizations, surgeries, and ER visits in previous 12 months . Vitals . Screenings to include cognitive, depression, and falls . Referrals and appointments  In addition, I have reviewed and discussed with patient certain preventive protocols, quality metrics, and best practice recommendations. A written personalized care plan for preventive services as well as general preventive health recommendations were provided to patient.     Kellie Simmering, LPN   01/05/4173   Nurse Notes:

## 2021-03-09 DIAGNOSIS — I700 Atherosclerosis of aorta: Secondary | ICD-10-CM | POA: Insufficient documentation

## 2021-04-11 ENCOUNTER — Other Ambulatory Visit: Payer: Self-pay | Admitting: Family Medicine

## 2021-04-11 NOTE — Telephone Encounter (Signed)
Spoke with pt to schedule apt pt states she already had apt last apt was on 11/04/2020 per note Return in about 6 months (around 05/04/2021) for Physical. Pt is scheduled for 05/04/2021 Pt unsure and unclear as to why medication was D/C from chart. Pt would like to speak to clinical staff as she has questions on medication and she stills takes medication .

## 2021-04-11 NOTE — Telephone Encounter (Signed)
Patient notified

## 2021-04-11 NOTE — Telephone Encounter (Signed)
   Notes to clinic: medication that has been requested is not on current medication list This medication was d/c   Requested Prescriptions  Pending Prescriptions Disp Refills   estradiol (ESTRACE) 2 MG tablet [Pharmacy Med Name: ESTRADIOL 2 MG TABLET] 90 tablet 0    Sig: Take 1 tablet (2 mg total) by mouth daily.      OB/GYN:  Estrogens Passed - 04/11/2021 11:19 AM      Passed - Mammogram is up-to-date per Health Maintenance      Passed - Last BP in normal range    BP Readings from Last 1 Encounters:  11/04/20 127/78          Passed - Valid encounter within last 12 months    Recent Outpatient Visits           5 months ago Mixed hyperlipidemia   [facility] [place], Megan P, DO   11 months ago Encounter for [employer] annual wellness exam   [place], DO   1 year ago Strain of abdominal muscle, initial encounter   [place], [place], DO   1 year ago Benign essential HTN   [place], [place], DO   1 year ago Ganglion cyst of finger of right hand   [place], Barb Merino, DO       Future Appointments             In 3 weeks [name], Barb Merino, DO [place], [place]   In 32 months  [employer], [place]

## 2021-04-11 NOTE — Telephone Encounter (Signed)
It was not d/c'd. The Rx expired. Will get her enough to make it to her appt

## 2021-04-12 DIAGNOSIS — J849 Interstitial pulmonary disease, unspecified: Secondary | ICD-10-CM | POA: Diagnosis not present

## 2021-04-19 ENCOUNTER — Other Ambulatory Visit: Payer: Self-pay | Admitting: Nurse Practitioner

## 2021-04-19 ENCOUNTER — Other Ambulatory Visit: Payer: Self-pay | Admitting: Unknown Physician Specialty

## 2021-04-19 NOTE — Telephone Encounter (Signed)
Scheduled 5/18

## 2021-04-19 NOTE — Telephone Encounter (Signed)
   Notes to clinic:  script has expired  Review for continued use and refill    Requested Prescriptions  Pending Prescriptions Disp Refills   ALLERGY RELIEF 180 MG tablet [Pharmacy Med Name: ALLERGY RELIEF 180 MG TABLET] 100 tablet 0    Sig: Take 1 tablet (180 mg total) by mouth daily.      Ear, Nose, and Throat:  Antihistamines Passed - 04/19/2021  9:31 AM      Passed - Valid encounter within last 12 months    Recent Outpatient Visits           5 months ago Mixed hyperlipidemia   [facility] [place], Megan P, DO   11 months ago Encounter for [employer] annual wellness exam   [place], DO   1 year ago Strain of abdominal muscle, initial encounter   [place], [place], DO   1 year ago Benign essential HTN   [place], [place], DO   1 year ago Ganglion cyst of finger of right hand   [place], Barb Merino, DO       Future Appointments             In 2 weeks [name], Barb Merino, DO [place], [place]   In 68 months  [employer], [place]

## 2021-04-27 ENCOUNTER — Other Ambulatory Visit: Payer: Self-pay | Admitting: Internal Medicine

## 2021-05-04 ENCOUNTER — Other Ambulatory Visit: Payer: Self-pay

## 2021-05-04 ENCOUNTER — Ambulatory Visit (INDEPENDENT_AMBULATORY_CARE_PROVIDER_SITE_OTHER): Payer: Medicare Other | Admitting: Family Medicine

## 2021-05-04 ENCOUNTER — Encounter: Payer: Self-pay | Admitting: Family Medicine

## 2021-05-04 VITALS — BP 128/79 | HR 80 | Temp 98.3°F | Ht 62.0 in | Wt 157.2 lb

## 2021-05-04 DIAGNOSIS — N1831 Chronic kidney disease, stage 3a: Secondary | ICD-10-CM

## 2021-05-04 DIAGNOSIS — N907 Vulvar cyst: Secondary | ICD-10-CM | POA: Diagnosis not present

## 2021-05-04 DIAGNOSIS — Z780 Asymptomatic menopausal state: Secondary | ICD-10-CM | POA: Diagnosis not present

## 2021-05-04 DIAGNOSIS — I10 Essential (primary) hypertension: Secondary | ICD-10-CM

## 2021-05-04 DIAGNOSIS — J849 Interstitial pulmonary disease, unspecified: Secondary | ICD-10-CM

## 2021-05-04 DIAGNOSIS — I700 Atherosclerosis of aorta: Secondary | ICD-10-CM

## 2021-05-04 DIAGNOSIS — J45909 Unspecified asthma, uncomplicated: Secondary | ICD-10-CM | POA: Diagnosis not present

## 2021-05-04 DIAGNOSIS — E782 Mixed hyperlipidemia: Secondary | ICD-10-CM

## 2021-05-04 DIAGNOSIS — E559 Vitamin D deficiency, unspecified: Secondary | ICD-10-CM

## 2021-05-04 DIAGNOSIS — K219 Gastro-esophageal reflux disease without esophagitis: Secondary | ICD-10-CM | POA: Diagnosis not present

## 2021-05-04 DIAGNOSIS — Z1231 Encounter for screening mammogram for malignant neoplasm of breast: Secondary | ICD-10-CM | POA: Diagnosis not present

## 2021-05-04 LAB — URINALYSIS, ROUTINE W REFLEX MICROSCOPIC
Bilirubin, UA: NEGATIVE
Glucose, UA: NEGATIVE
Ketones, UA: NEGATIVE
Leukocytes,UA: NEGATIVE
Nitrite, UA: NEGATIVE
Protein,UA: NEGATIVE
RBC, UA: NEGATIVE
Specific Gravity, UA: 1.015 (ref 1.005–1.030)
Urobilinogen, Ur: 0.2 mg/dL (ref 0.2–1.0)
pH, UA: 5.5 (ref 5.0–7.5)

## 2021-05-04 LAB — MICROALBUMIN, URINE WAIVED
Creatinine, Urine Waived: 100 mg/dL (ref 10–300)
Microalb, Ur Waived: 10 mg/L (ref 0–19)
Microalb/Creat Ratio: <30 mg/g (ref <30)

## 2021-05-04 MED ORDER — FLUTICASONE PROPIONATE 50 MCG/ACT NA SUSP
2.0000 | Freq: Every day | NASAL | 12 refills | Status: DC
Start: 2021-05-04 — End: 2023-11-07

## 2021-05-04 MED ORDER — ESTRADIOL 1 MG PO TABS
1.0000 mg | ORAL_TABLET | Freq: Every day | ORAL | 1 refills | Status: DC
Start: 2021-05-04 — End: 2021-12-08

## 2021-05-04 MED ORDER — HYDROCHLOROTHIAZIDE 25 MG PO TABS
25.0000 mg | ORAL_TABLET | Freq: Every day | ORAL | 1 refills | Status: DC
Start: 2021-05-04 — End: 2021-12-13

## 2021-05-04 MED ORDER — TRIAMCINOLONE ACETONIDE 0.1 % EX CREA: 1.0000 "application " | TOPICAL_CREAM | Freq: Two times a day (BID) | CUTANEOUS | 0 refills | Status: AC

## 2021-05-04 NOTE — Patient Instructions (Signed)
Health Maintenance After Age 75 After age 75, you are at a higher risk for certain long-term diseases and infections as well as injuries from falls. Falls are a major cause of broken bones and head injuries in people who are older than age 75. Getting regular preventive care can help to keep you healthy and well. Preventive care includes getting regular testing and making lifestyle changes as recommended by your health care provider. Talk with your health care provider about:  Which screenings and tests you should have. A screening is a test that checks for a disease when you have no symptoms.  A diet and exercise plan that is right for you. What should I know about screenings and tests to prevent falls? Screening and testing are the best ways to find a health problem early. Early diagnosis and treatment give you the best chance of managing medical conditions that are common after age 75. Certain conditions and lifestyle choices may make you more likely to have a fall. Your health care provider may recommend:  Regular vision checks. Poor vision and conditions such as cataracts can make you more likely to have a fall. If you wear glasses, make sure to get your prescription updated if your vision changes.  Medicine review. Work with your health care provider to regularly review all of the medicines you are taking, including over-the-counter medicines. Ask your health care provider about any side effects that may make you more likely to have a fall. Tell your health care provider if any medicines that you take make you feel dizzy or sleepy.  Osteoporosis screening. Osteoporosis is a condition that causes the bones to get weaker. This can make the bones weak and cause them to break more easily.  Blood pressure screening. Blood pressure changes and medicines to control blood pressure can make you feel dizzy.  Strength and balance checks. Your health care provider may recommend certain tests to check your  strength and balance while standing, walking, or changing positions.  Foot health exam. Foot pain and numbness, as well as not wearing proper footwear, can make you more likely to have a fall.  Depression screening. You may be more likely to have a fall if you have a fear of falling, feel emotionally low, or feel unable to do activities that you used to do.  Alcohol use screening. Using too much alcohol can affect your balance and may make you more likely to have a fall. What actions can I take to lower my risk of falls? General instructions  Talk with your health care provider about your risks for falling. Tell your health care provider if: ? You fall. Be sure to tell your health care provider about all falls, even ones that seem minor. ? You feel dizzy, sleepy, or off-balance.  Take over-the-counter and prescription medicines only as told by your health care provider. These include any supplements.  Eat a healthy diet and maintain a healthy weight. A healthy diet includes low-fat dairy products, low-fat (lean) meats, and fiber from whole grains, beans, and lots of fruits and vegetables. Home safety  Remove any tripping hazards, such as rugs, cords, and clutter.  Install safety equipment such as grab bars in bathrooms and safety rails on stairs.  Keep rooms and walkways well-lit. Activity  Follow a regular exercise program to stay fit. This will help you maintain your balance. Ask your health care provider what types of exercise are appropriate for you.  If you need a cane or walker,   use it as recommended by your health care provider.  Wear supportive shoes that have nonskid soles.   Lifestyle  Do not drink alcohol if your health care provider tells you not to drink.  If you drink alcohol, limit how much you have: ? 0-1 drink a day for women. ? 0-2 drinks a day for men.  Be aware of how much alcohol is in your drink. In the [place] one drink equals one typical bottle of beer (12  oz), one-half glass of wine (5 oz), or one shot of hard liquor (1 oz).  Do not use any products that contain nicotine or tobacco, such as cigarettes and e-cigarettes. If you need help quitting, ask your health care provider. Summary  Having a healthy lifestyle and getting preventive care can help to protect your health and wellness after age 75.  Screening and testing are the best way to find a health problem early and help you avoid having a fall. Early diagnosis and treatment give you the best chance for managing medical conditions that are more common for people who are older than age 75.  Falls are a major cause of broken bones and head injuries in people who are older than age 75. Take precautions to prevent a fall at home.  Work with your health care provider to learn what changes you can make to improve your health and wellness and to prevent falls. This information is not intended to replace advice given to you by your health care provider. Make sure you discuss any questions you have with your health care provider. Document Revised: 03/27/2019 Document Reviewed: 10/17/2017 Elsevier Patient Education  2021 Elsevier Inc.  

## 2021-05-04 NOTE — Progress Notes (Signed)
BP 128/79   Pulse 80   Temp 98.3 F (36.8 C)   Ht _0  (1.575 m)   Wt 157 lb 3.2 oz (71.3 kg)   LMP 07/31/1981 (Approximate)   SpO2 95%   BMI 28.75 kg/m    Subjective:    Patient ID: Nancy Mack, female    DOB: 1945-12-21, 75 y.o.   MRN: [id number]  HPI: Nancy Mack is a 75 y.o. female  Chief Complaint  Patient presents with  . Hypertension  . Hyperlipidemia   Gets a rash on her neck when it's hot and she puts on jewelery.  She gets bumps in her groin. She will pop them. There almost all the time.    HYPERTENSION / HYPERLIPIDEMIA Satisfied with current treatment? yes Duration of hypertension: chronic BP monitoring frequency: not checking BP medication side effects: no Past BP meds: HCTZ Duration of hyperlipidemia: chronic Cholesterol medication side effects: no Cholesterol supplements: none Past cholesterol medications: atorvastatin Medication compliance: excellent compliance Aspirin: yes Recent stressors: no Recurrent headaches: no Visual changes: no Palpitations: no Dyspnea: yes Chest pain: no Lower extremity edema: no Dizzy/lightheaded: yes   Relevant past medical, surgical, family and social history reviewed and updated as indicated. Interim medical history since our last visit reviewed. Allergies and medications reviewed and updated.  Review of Systems  Constitutional: Negative.   HENT: Negative.   Eyes: Negative.   Respiratory: Negative.   Cardiovascular: Negative.   Gastrointestinal: Negative.   Genitourinary: Negative.   Musculoskeletal: Negative.   Allergic/Immunologic: Negative.   Neurological: Negative.   Hematological: Negative.   Psychiatric/Behavioral: Negative.     Per HPI unless specifically indicated above     Objective:    BP 128/79   Pulse 80   Temp 98.3 F (36.8 C)   Ht _1  (1.575 m)   Wt 157 lb 3.2 oz (71.3 kg)   LMP 07/31/1981 (Approximate)   SpO2 95%   BMI 28.75 kg/m   Wt Readings from Last 3  Encounters:  05/04/21 157 lb 3.2 oz (71.3 kg)  03/04/21 150 lb (68 kg)  11/04/20 152 lb 12.8 oz (69.3 kg)    Physical Exam Vitals and nursing note reviewed.  Constitutional:      General: She is not in acute distress.    Appearance: Normal appearance. She is not ill-appearing, toxic-appearing or diaphoretic.  HENT:     Head: Normocephalic and atraumatic.     Right Ear: Tympanic membrane, ear canal and external ear normal. There is no impacted cerumen.     Left Ear: Tympanic membrane, ear canal and external ear normal. There is no impacted cerumen.     Nose: Nose normal. No congestion or rhinorrhea.     Mouth/Throat:     Mouth: Mucous membranes are moist.     Pharynx: Oropharynx is clear. No oropharyngeal exudate or posterior oropharyngeal erythema.  Eyes:     General: No scleral icterus.       Right eye: No discharge.        Left eye: No discharge.     Extraocular Movements: Extraocular movements intact.     Conjunctiva/sclera: Conjunctivae normal.     Pupils: Pupils are equal, round, and reactive to light.  Neck:     Vascular: No carotid bruit.  Cardiovascular:     Rate and Rhythm: Normal rate and regular rhythm.     Pulses: Normal pulses.     Heart sounds: No murmur heard. No friction rub. No gallop.  Pulmonary:     Effort: Pulmonary effort is normal. No respiratory distress.     Breath sounds: Normal breath sounds. No stridor. No wheezing, rhonchi or rales.  Chest:     Chest wall: No tenderness.  Abdominal:     General: Abdomen is flat. Bowel sounds are normal. There is no distension.     Palpations: Abdomen is soft. There is no mass.     Tenderness: There is no abdominal tenderness. There is no right CVA tenderness, left CVA tenderness, guarding or rebound.     Hernia: No hernia is present.  Genitourinary:    Comments: Breast and pelvic exams deferred with shared decision making Musculoskeletal:        General: No swelling, tenderness, deformity or signs of injury.      Cervical back: Normal range of motion and neck supple. No rigidity. No muscular tenderness.     Right lower leg: No edema.     Left lower leg: No edema.  Lymphadenopathy:     Cervical: No cervical adenopathy.  Skin:    General: Skin is warm and dry.     Capillary Refill: Capillary refill takes less than 2 seconds.     Coloration: Skin is not jaundiced or pale.     Findings: No bruising, erythema, lesion or rash.       Neurological:     General: No focal deficit present.     Mental Status: She is alert and oriented to person, place, and time. Mental status is at baseline.     Cranial Nerves: No cranial nerve deficit.     Sensory: No sensory deficit.     Motor: No weakness.     Coordination: Coordination normal.     Gait: Gait normal.     Deep Tendon Reflexes: Reflexes normal.  Psychiatric:        Mood and Affect: Mood normal.        Behavior: Behavior normal.        Thought Content: Thought content normal.        Judgment: Judgment normal.     Results for orders placed or performed in visit on 05/04/21  CBC with Differential/Platelet  Result Value Ref Range   WBC 7.3 3.4 - 10.8 x10E3/uL   RBC 4.59 3.77 - 5.28 x10E6/uL   Hemoglobin 12.0 11.1 - 15.9 g/dL   Hematocrit 37.9 34.0 - 46.6 %   MCV 83 79 - 97 fL   MCH 26.1 (L) 26.6 - 33.0 pg   MCHC 31.7 31.5 - 35.7 g/dL   RDW 14.7 11.7 - 15.4 %   Platelets 368 150 - 450 x10E3/uL   Neutrophils 66 Not Estab. %   Lymphs 21 Not Estab. %   Monocytes 7 Not Estab. %   Eos 5 Not Estab. %   Basos 1 Not Estab. %   Neutrophils Absolute 4.8 1.4 - 7.0 x10E3/uL   Lymphocytes Absolute 1.5 0.7 - 3.1 x10E3/uL   Monocytes Absolute 0.5 0.1 - 0.9 x10E3/uL   EOS (ABSOLUTE) 0.3 0.0 - 0.4 x10E3/uL   Basophils Absolute 0.1 0.0 - 0.2 x10E3/uL   Immature Granulocytes 0 Not Estab. %   Immature Grans (Abs) 0.0 0.0 - 0.1 x10E3/uL  Comprehensive metabolic panel  Result Value Ref Range   Glucose 93 65 - 99 mg/dL   BUN 16 8 - 27 mg/dL    Creatinine, Ser 0.94 0.57 - 1.00 mg/dL   eGFR 64 >59 mL/min/1.73   BUN/Creatinine Ratio 17 12 - 28  Sodium 141 134 - 144 mmol/L   Potassium 3.6 3.5 - 5.2 mmol/L   Chloride 102 96 - 106 mmol/L   CO2 25 20 - 29 mmol/L   Calcium 9.2 8.7 - 10.3 mg/dL   Total Protein 6.2 6.0 - 8.5 g/dL   Albumin 4.0 3.7 - 4.7 g/dL   Globulin, Total 2.2 1.5 - 4.5 g/dL   Albumin/Globulin Ratio 1.8 1.2 - 2.2   Bilirubin Total 0.3 0.0 - 1.2 mg/dL   Alkaline Phosphatase 77 44 - 121 IU/L   AST 19 0 - 40 IU/L   ALT 12 0 - 32 IU/L  Lipid Panel w/o Chol/HDL Ratio  Result Value Ref Range   Cholesterol, Total 184 100 - 199 mg/dL   Triglycerides 121 0 - 149 mg/dL   HDL 73 >39 mg/dL   VLDL Cholesterol Cal 21 5 - 40 mg/dL   LDL Chol Calc (NIH) 90 0 - 99 mg/dL  Microalbumin, Urine Waived  Result Value Ref Range   Microalb, Ur Waived 10 0 - 19 mg/L   Creatinine, Urine Waived 100 10 - 300 mg/dL   Microalb/Creat Ratio <30 <30 mg/g  TSH  Result Value Ref Range   TSH 1.090 0.450 - 4.500 uIU/mL  Urinalysis, Routine w reflex microscopic  Result Value Ref Range   Specific Gravity, UA 1.015 1.005 - 1.030   pH, UA 5.5 5.0 - 7.5   Color, UA Yellow Yellow   Appearance Ur Cloudy (A) Clear   Leukocytes,UA Negative Negative   Protein,UA Negative Negative/Trace   Glucose, UA Negative Negative   Ketones, UA Negative Negative   RBC, UA Negative Negative   Bilirubin, UA Negative Negative   Urobilinogen, Ur 0.2 0.2 - 1.0 mg/dL   Nitrite, UA Negative Negative  VITAMIN D 25 Hydroxy (Vit-D Deficiency, Fractures)  Result Value Ref Range   Vit D, 25-Hydroxy 39.6 30.0 - 100.0 ng/mL      Assessment & Plan:   Problem List Items Addressed This Visit      Cardiovascular and Mediastinum   Benign essential HTN - Primary    Under good control on current regimen. Continue current regimen. Continue to monitor. Call with any concerns. Refills given. Labs drawn today.        Relevant Medications   atorvastatin (LIPITOR) 40 MG  tablet   hydrochlorothiazide (HYDRODIURIL) 25 MG tablet   Other Relevant Orders   CBC with Differential/Platelet (Completed)   Comprehensive metabolic panel (Completed)   Microalbumin, Urine Waived (Completed)   TSH (Completed)   Aortic atherosclerosis (HCC)    Will keep BP and cholesterol under good control. Call with any concerns.       Relevant Medications   atorvastatin (LIPITOR) 40 MG tablet   hydrochlorothiazide (HYDRODIURIL) 25 MG tablet   Other Relevant Orders   CBC with Differential/Platelet (Completed)   Comprehensive metabolic panel (Completed)     Respiratory   Asthma    Stable. Continue to follow with pulmonology. Call with any concerns. Continue to monitor.       ILD (interstitial lung disease) ([place])    Stable. Continue to follow with pulmonology. Call with any concerns. Continue to monitor.       Relevant Orders   CBC with Differential/Platelet (Completed)   Comprehensive metabolic panel (Completed)     Digestive   GERD (gastroesophageal reflux disease)    Under good control on current regimen. Continue current regimen. Continue to monitor. Call with any concerns. Refills given. Labs drawn today.  Relevant Medications   pantoprazole (PROTONIX) 20 MG tablet   Other Relevant Orders   CBC with Differential/Platelet (Completed)   Comprehensive metabolic panel (Completed)     Genitourinary   Stage 3a chronic kidney disease ([place])    Under good control on current regimen. Continue current regimen. Continue to monitor. Call with any concerns. Refills given. Labs drawn today.        Relevant Orders   CBC with Differential/Platelet (Completed)   Comprehensive metabolic panel (Completed)   Urinalysis, Routine w reflex microscopic (Completed)     Other   Hyperlipidemia    Under good control on current regimen. Continue current regimen. Continue to monitor. Call with any concerns. Refills given. Labs drawn today.        Relevant Medications    atorvastatin (LIPITOR) 40 MG tablet   hydrochlorothiazide (HYDRODIURIL) 25 MG tablet   Other Relevant Orders   CBC with Differential/Platelet (Completed)   Comprehensive metabolic panel (Completed)   Lipid Panel w/o Chol/HDL Ratio (Completed)   Vitamin D deficiency    Under good control on current regimen. Continue current regimen. Continue to monitor. Call with any concerns. Refills given. Labs drawn today.        Relevant Orders   CBC with Differential/Platelet (Completed)   Comprehensive metabolic panel (Completed)   VITAMIN D 25 Hydroxy (Vit-D Deficiency, Fractures) (Completed)    Other Visit Diagnoses    Encounter for screening mammogram for malignant neoplasm of breast       Mammogram ordered today. Call with any concerns. Continue to monitor.    Relevant Orders   MM DIAG BREAST TOMO BILATERAL   Sebaceous cyst of labia       Given location will refer to dermatology. Call with any concerns.    Relevant Orders   Ambulatory referral to Dermatology   Postmenopausal estrogen deficiency       DEXA ordered. Encouraged patient to get it.        Follow up plan: Return in about 6 months (around 11/04/2021).

## 2021-05-05 LAB — LIPID PANEL W/O CHOL/HDL RATIO
Cholesterol, Total: 184 mg/dL (ref 100–199)
HDL: 73 mg/dL (ref >39)
LDL Chol Calc (NIH): 90 mg/dL (ref 0–99)
Triglycerides: 121 mg/dL (ref 0–149)
VLDL Cholesterol Cal: 21 mg/dL (ref 5–40)

## 2021-05-05 LAB — COMPREHENSIVE METABOLIC PANEL
ALT: 12 IU/L (ref 0–32)
AST: 19 IU/L (ref 0–40)
Albumin/Globulin Ratio: 1.8 (ref 1.2–2.2)
Albumin: 4 g/dL (ref 3.7–4.7)
Alkaline Phosphatase: 77 IU/L (ref 44–121)
BUN/Creatinine Ratio: 17 (ref 12–28)
BUN: 16 mg/dL (ref 8–27)
Bilirubin Total: 0.3 mg/dL (ref 0.0–1.2)
CO2: 25 mmol/L (ref 20–29)
Calcium: 9.2 mg/dL (ref 8.7–10.3)
Chloride: 102 mmol/L (ref 96–106)
Creatinine, Ser: 0.94 mg/dL (ref 0.57–1.00)
Globulin, Total: 2.2 g/dL (ref 1.5–4.5)
Glucose: 93 mg/dL (ref 65–99)
Potassium: 3.6 mmol/L (ref 3.5–5.2)
Sodium: 141 mmol/L (ref 134–144)
Total Protein: 6.2 g/dL (ref 6.0–8.5)
eGFR: 64 mL/min/{1.73_m2} (ref >59)

## 2021-05-05 LAB — CBC WITH DIFFERENTIAL/PLATELET
Basophils Absolute: 0.1 10*3/uL (ref 0.0–0.2)
Basos: 1 %
EOS (ABSOLUTE): 0.3 10*3/uL (ref 0.0–0.4)
Eos: 5 %
Hematocrit: 37.9 % (ref 34.0–46.6)
Hemoglobin: 12 g/dL (ref 11.1–15.9)
Immature Grans (Abs): 0 10*3/uL (ref 0.0–0.1)
Immature Granulocytes: 0 %
Lymphocytes Absolute: 1.5 10*3/uL (ref 0.7–3.1)
Lymphs: 21 %
MCH: 26.1 pg — ABNORMAL LOW (ref 26.6–33.0)
MCHC: 31.7 g/dL (ref 31.5–35.7)
MCV: 83 fL (ref 79–97)
Monocytes Absolute: 0.5 10*3/uL (ref 0.1–0.9)
Monocytes: 7 %
Neutrophils Absolute: 4.8 10*3/uL (ref 1.4–7.0)
Neutrophils: 66 %
Platelets: 368 10*3/uL (ref 150–450)
RBC: 4.59 x10E6/uL (ref 3.77–5.28)
RDW: 14.7 % (ref 11.7–15.4)
WBC: 7.3 10*3/uL (ref 3.4–10.8)

## 2021-05-05 LAB — TSH: TSH: 1.09 u[IU]/mL (ref 0.450–4.500)

## 2021-05-05 LAB — VITAMIN D 25 HYDROXY (VIT D DEFICIENCY, FRACTURES): Vit D, 25-Hydroxy: 39.6 ng/mL (ref 30.0–100.0)

## 2021-05-05 NOTE — Assessment & Plan Note (Signed)
Stable. Continue to follow with pulmonology. Call with any concerns. Continue to monitor.  

## 2021-05-05 NOTE — Assessment & Plan Note (Signed)
Under good control on current regimen. Continue current regimen. Continue to monitor. Call with any concerns. Refills given. Labs drawn today.   

## 2021-05-05 NOTE — Assessment & Plan Note (Signed)
Will keep BP and cholesterol under good control. Call with any concerns.  ?

## 2021-05-11 ENCOUNTER — Telehealth: Payer: Self-pay

## 2021-05-11 ENCOUNTER — Telehealth (INDEPENDENT_AMBULATORY_CARE_PROVIDER_SITE_OTHER): Payer: Medicare Other | Admitting: Nurse Practitioner

## 2021-05-11 ENCOUNTER — Encounter: Payer: Self-pay | Admitting: Nurse Practitioner

## 2021-05-11 ENCOUNTER — Other Ambulatory Visit: Payer: Self-pay

## 2021-05-11 VITALS — BP 119/72 | HR 89 | Temp 97.2°F

## 2021-05-11 DIAGNOSIS — U071 COVID-19: Secondary | ICD-10-CM | POA: Diagnosis not present

## 2021-05-11 NOTE — Telephone Encounter (Signed)
Scheduled with Santiago Glad today  Copied from [place] [id number]. Topic: General - Other >> May 11, 2021  7:56 AM Leward Quan A wrote: Reason for CRM: Patient called in to say that she was having flu like symptoms and took a covid test this morning and it came out positive. Patient states that she is fully vaccinated with booster and is still taking the Zpack that was given to her. Asking for a call back with some instructions on what to do. Ph# [phone]

## 2021-05-11 NOTE — Progress Notes (Signed)
BP 119/72   Pulse 89   Temp (!) 97.2 F (36.2 C) (Temporal)   LMP 07/31/1981 (Approximate)    Subjective:    Patient ID: Nancy Mack, female    DOB: [birth year], 75 y.o.   MRN: [id number]  HPI: Nancy Mack is a 75 y.o. female  Chief Complaint  Patient presents with  . Covid Positive    Pt states she tested positive for covid at home this morning. States symptoms of a cold started on Monday- states she is having congestion, sinus pressure, and a cough. States she was given a z-pack from pulmonary on Monday that she has been taking    Patient states she started feeling bad on Monday.  Then she tested positive for COVID yesterday.  She felt bad yesterday and had a cough.  Felt like the Flu was coming on.  Then today she feels like she is better.  Able to move around and get things done.  Patient was given a z pak by pulmonology a few weeks ago just in case she started feeling bad and has been taking that.  Denies SOB, Fever, body aches, or Headaches.   Relevant past medical, surgical, family and social history reviewed and updated as indicated. Interim medical history since our last visit reviewed. Allergies and medications reviewed and updated.  Review of Systems  Constitutional: Negative for fever.       Denies body aches  HENT: Positive for congestion and sinus pressure.   Respiratory: Positive for cough. Negative for shortness of breath.   Neurological: Negative for headaches.    Per HPI unless specifically indicated above     Objective:    BP 119/72   Pulse 89   Temp (!) 97.2 F (36.2 C) (Temporal)   LMP 07/31/1981 (Approximate)   Wt Readings from Last 3 Encounters:  05/04/21 157 lb 3.2 oz (71.3 kg)  03/04/21 150 lb (68 kg)  11/04/20 152 lb 12.8 oz (69.3 kg)    Physical Exam Vitals and nursing note reviewed.  HENT:     Head: Normocephalic.     Right Ear: Hearing normal.     Left Ear: Hearing normal.     Nose: Nose normal.  Eyes:     Pupils:  Pupils are equal, round, and reactive to light.  Pulmonary:     Effort: Pulmonary effort is normal. No respiratory distress.  Neurological:     Mental Status: She is alert.  Psychiatric:        Mood and Affect: Mood normal.        Behavior: Behavior normal.        Thought Content: Thought content normal.        Judgment: Judgment normal.     Results for orders placed or performed in visit on 05/04/21  CBC with Differential/Platelet  Result Value Ref Range   WBC 7.3 3.4 - 10.8 x10E3/uL   RBC 4.59 3.77 - 5.28 x10E6/uL   Hemoglobin 12.0 11.1 - 15.9 g/dL   Hematocrit 37.9 34.0 - 46.6 %   MCV 83 79 - 97 fL   MCH 26.1 (L) 26.6 - 33.0 pg   MCHC 31.7 31.5 - 35.7 g/dL   RDW 14.7 11.7 - 15.4 %   Platelets 368 150 - 450 x10E3/uL   Neutrophils 66 Not Estab. %   Lymphs 21 Not Estab. %   Monocytes 7 Not Estab. %   Eos 5 Not Estab. %   Basos 1 Not Estab. %  Neutrophils Absolute 4.8 1.4 - 7.0 x10E3/uL   Lymphocytes Absolute 1.5 0.7 - 3.1 x10E3/uL   Monocytes Absolute 0.5 0.1 - 0.9 x10E3/uL   EOS (ABSOLUTE) 0.3 0.0 - 0.4 x10E3/uL   Basophils Absolute 0.1 0.0 - 0.2 x10E3/uL   Immature Granulocytes 0 Not Estab. %   Immature Grans (Abs) 0.0 0.0 - 0.1 x10E3/uL  Comprehensive metabolic panel  Result Value Ref Range   Glucose 93 65 - 99 mg/dL   BUN 16 8 - 27 mg/dL   Creatinine, Ser 0.94 0.57 - 1.00 mg/dL   eGFR 64 >59 mL/min/1.73   BUN/Creatinine Ratio 17 12 - 28   Sodium 141 134 - 144 mmol/L   Potassium 3.6 3.5 - 5.2 mmol/L   Chloride 102 96 - 106 mmol/L   CO2 25 20 - 29 mmol/L   Calcium 9.2 8.7 - 10.3 mg/dL   Total Protein 6.2 6.0 - 8.5 g/dL   Albumin 4.0 3.7 - 4.7 g/dL   Globulin, Total 2.2 1.5 - 4.5 g/dL   Albumin/Globulin Ratio 1.8 1.2 - 2.2   Bilirubin Total 0.3 0.0 - 1.2 mg/dL   Alkaline Phosphatase 77 44 - 121 IU/L   AST 19 0 - 40 IU/L   ALT 12 0 - 32 IU/L  Lipid Panel w/o Chol/HDL Ratio  Result Value Ref Range   Cholesterol, Total 184 100 - 199 mg/dL   Triglycerides 121  0 - 149 mg/dL   HDL 73 >39 mg/dL   VLDL Cholesterol Cal 21 5 - 40 mg/dL   LDL Chol Calc (NIH) 90 0 - 99 mg/dL  Microalbumin, Urine Waived  Result Value Ref Range   Microalb, Ur Waived 10 0 - 19 mg/L   Creatinine, Urine Waived 100 10 - 300 mg/dL   Microalb/Creat Ratio <30 <30 mg/g  TSH  Result Value Ref Range   TSH 1.090 0.450 - 4.500 uIU/mL  Urinalysis, Routine w reflex microscopic  Result Value Ref Range   Specific Gravity, UA 1.015 1.005 - 1.030   pH, UA 5.5 5.0 - 7.5   Color, UA Yellow Yellow   Appearance Ur Cloudy (A) Clear   Leukocytes,UA Negative Negative   Protein,UA Negative Negative/Trace   Glucose, UA Negative Negative   Ketones, UA Negative Negative   RBC, UA Negative Negative   Bilirubin, UA Negative Negative   Urobilinogen, Ur 0.2 0.2 - 1.0 mg/dL   Nitrite, UA Negative Negative  VITAMIN D 25 Hydroxy (Vit-D Deficiency, Fractures)  Result Value Ref Range   Vit D, 25-Hydroxy 39.6 30.0 - 100.0 ng/mL      Assessment & Plan:   Problem List Items Addressed This Visit   None   Visit Diagnoses    COVID-19    -  Primary   Recommend quarantine until symptoms resolve.  Continue with symptom managment. Advised of S/S to monitor for and when to seek higher level of care.        Follow up plan: Return if symptoms worsen or fail to improve.   This visit was completed via MyChart due to the restrictions of the COVID-19 pandemic. All issues as above were discussed and addressed. Physical exam was done as above through visual confirmation on MyChart. If it was felt that the patient should be evaluated in the office, they were directed there. The patient verbally consented to this visit. 1. Location of the patient: Home 2. Location of the provider: Office 3. Those involved with this call:  ? Provider: Jon Billings, NP ? CMA: [place]  Joneen Caraway, CMA ? Front Desk/Registration: [facility] 4. Time spent on call: 15 minutes with patient face to face via video  conference. More than 50% of this time was spent in counseling and coordination of care. 20 minutes total spent in review of patient's record and preparation of their chart.

## 2021-06-14 DIAGNOSIS — R2231 Localized swelling, mass and lump, right upper limb: Secondary | ICD-10-CM | POA: Diagnosis not present

## 2021-06-21 DIAGNOSIS — Z1231 Encounter for screening mammogram for malignant neoplasm of breast: Secondary | ICD-10-CM | POA: Diagnosis not present

## 2021-06-21 DIAGNOSIS — Z13820 Encounter for screening for osteoporosis: Secondary | ICD-10-CM | POA: Diagnosis not present

## 2021-09-01 ENCOUNTER — Other Ambulatory Visit: Payer: Self-pay

## 2021-09-01 ENCOUNTER — Ambulatory Visit (INDEPENDENT_AMBULATORY_CARE_PROVIDER_SITE_OTHER): Payer: Medicare Other | Admitting: Dermatology

## 2021-09-01 DIAGNOSIS — L720 Epidermal cyst: Secondary | ICD-10-CM | POA: Diagnosis not present

## 2021-09-01 NOTE — Progress Notes (Signed)
   New Patient Visit  Subjective  Nancy Mack is a 75 y.o. female who presents for the following: bumps (Groin, ~69yr irritating, pt has expressed in past).  [facility]  The following portions of the chart were reviewed this encounter and updated as appropriate:   Tobacco  Allergies  Meds  Problems  Med Hx  Surg Hx  Fam Hx     Review of Systems:  No other skin or systemic complaints except as noted in HPI or Assessment and Plan.  Objective  Well appearing patient in no apparent distress; mood and affect are within normal limits.  A focused examination was performed including groin. Relevant physical exam findings are noted in the Assessment and Plan.  R labia, mons pubis 1.2cm cystic pap L mons pubis 0.7cm cystic pap R labia   Assessment & Plan  Epidermal cyst (2) R labia; mons pubis -both in genital area  Benign but symptomatic and growing and draining  Discussed surgical excision x 2 appointments Discussed risk of scarring and risk of feeling sensation changes with scarring. Discussed with surgery will need to avoid sexual activity several weeks after surgeries. Recommend shaving 3 days prior to surgery appointments  Return for Surgery x 2 for cysts in groin.  I, SOthelia Pulling RMA, am acting as scribe for DSarina Ser MD . Documentation: I have reviewed the above documentation for accuracy and completeness, and I agree with the above.  DSarina Ser MD

## 2021-09-01 NOTE — Patient Instructions (Addendum)
If you have any questions or concerns for your doctor, please call our main line at [phone] and press option 4 to reach your doctor's medical assistant. If no one answers, please leave a voicemail as directed and we will return your call as soon as possible. Messages left after 4 pm will be answered the following business day.   You may also send [place] a message via [place]. We typically respond to MyChart messages within 1-2 business days.  For prescription refills, please ask your pharmacy to contact our office. Our fax number is [phone].  If you have an urgent issue when the clinic is closed that cannot wait until the next business day, you can page your doctor at the number below.    Please note that while we do our best to be available for urgent issues outside of office hours, we are not available 24/7.   If you have an urgent issue and are unable to reach [place], you may choose to seek medical care at your doctor's office, retail clinic, urgent care center, or emergency room.  If you have a medical emergency, please immediately call 911 or go to the emergency department.  Pager Numbers  - Dr. Nehemiah Massed: [phone]  - Dr. Laurence Ferrari: [phone]  - Dr. Nicole Kindred: [phone]  In the event of inclement weather, please call our main line at [phone] for an update on the status of any delays or closures.  Dermatology Medication Tips: Please keep the boxes that topical medications come in in order to help keep track of the instructions about where and how to use these. Pharmacies typically print the medication instructions only on the boxes and not directly on the medication tubes.   If your medication is too expensive, please contact our office at [phone] option 4 or send [place] a message through [place].   We are unable to tell what your co-pay for medications will be in advance as this is different depending on your insurance coverage. However, we may be able to find a substitute  medication at lower cost or fill out paperwork to get insurance to cover a needed medication.   If a prior authorization is required to get your medication covered by your insurance company, please allow [place] 1-2 business days to complete this process.  Drug prices often vary depending on where the prescription is filled and some pharmacies may offer cheaper prices.  The website www.goodrx.com contains coupons for medications through different pharmacies. The prices here do not account for what the cost may be with help from insurance (it may be cheaper with your insurance), but the website can give you the price if you did not use any insurance.  - You can print the associated coupon and take it with your prescription to the pharmacy.  - You may also stop by our office during regular business hours and pick up a GoodRx coupon card.  - If you need your prescription sent electronically to a different pharmacy, notify our office through [facility] or by phone at [phone] option 4.     Pre-Operative Instructions  You are scheduled for a surgical procedure at [facility]. We recommend you read the following instructions. If you have any questions or concerns, please call the office at [phone].  Shower and wash the entire body with soap and water the day of your surgery paying special attention to cleansing at and around the planned surgery site.  Avoid aspirin or aspirin containing products at least fourteen (  14) days prior to your surgical procedure and for at least one week (7 Days) after your surgical procedure. If you take aspirin on a regular basis for heart disease or history of stroke or for any other reason, we may recommend you continue taking aspirin but please notify us if you take this on a regular basis. Aspirin can cause more bleeding to occur during surgery as well as prolonged bleeding and bruising after surgery.   Avoid other nonsteroidal pain medications  at least one week prior to surgery and at least one week prior to your surgery. These include medications such as Ibuprofen (Motrin, Advil and Nuprin), Naprosyn, Voltaren, Relafen, etc. If medications are used for therapeutic reasons, please inform us as they can cause increased bleeding or prolonged bleeding during and bruising after surgical procedures.   Please advise [place] if you are taking any "blood thinner" medications such as Coumadin or Dipyridamole or Plavix or similar medications. These cause increased bleeding and prolonged bleeding during procedures and bruising after surgical procedures. We may have to consider discontinuing these medications briefly prior to and shortly after your surgery if safe to do so.   Please inform us of all medications you are currently taking. All medications that are taken regularly should be taken the day of surgery as you always do. Nevertheless, we need to be informed of what medications you are taking prior to surgery to know whether they will affect the procedure or cause any complications.   Please inform us of any medication allergies. Also inform us of whether you have allergies to Latex or rubber products or whether you have had any adverse reaction to Lidocaine or Epinephrine.  Please inform us of any prosthetic or artificial body parts such as artificial heart valve, joint replacements, etc., or similar condition that might require preoperative antibiotics.   We recommend avoidance of alcohol at least two weeks prior to surgery and continued avoidance for at least two weeks after surgery.   We recommend discontinuation of tobacco smoking at least two weeks prior to surgery and continued abstinence for at least two weeks after surgery.  Do not plan strenuous exercise, strenuous work or strenuous lifting for approximately four weeks after your surgery.   We request if you are unable to make your scheduled surgical appointment, please call us at least a  week in advance or as soon as you are aware of a problem so that we can cancel or reschedule the appointment.   You MAY TAKE TYLENOL (acetaminophen) for pain as it is not a blood thinner.   PLEASE PLAN TO BE IN TOWN FOR TWO WEEKS FOLLOWING SURGERY, THIS IS IMPORTANT SO YOU CAN BE CHECKED FOR DRESSING CHANGES, SUTURE REMOVAL AND TO MONITOR FOR POSSIBLE COMPLICATIONS.    Recommend shaving groin area 3 days prior to surgery appointments Recommend stopping aspirin 1 week prior to surgery.

## 2021-09-04 ENCOUNTER — Encounter: Payer: Self-pay | Admitting: Dermatology

## 2021-09-05 ENCOUNTER — Ambulatory Visit (INDEPENDENT_AMBULATORY_CARE_PROVIDER_SITE_OTHER): Payer: Medicare Other

## 2021-09-05 ENCOUNTER — Other Ambulatory Visit: Payer: Self-pay

## 2021-09-05 DIAGNOSIS — Z23 Encounter for immunization: Secondary | ICD-10-CM | POA: Diagnosis not present

## 2021-09-22 ENCOUNTER — Other Ambulatory Visit: Payer: Self-pay | Admitting: Unknown Physician Specialty

## 2021-09-22 DIAGNOSIS — H40009 Preglaucoma, unspecified, unspecified eye: Secondary | ICD-10-CM | POA: Diagnosis not present

## 2021-09-22 NOTE — Telephone Encounter (Signed)
Requested Prescriptions  Pending Prescriptions Disp Refills  . azelastine (ASTELIN) 0.1 % nasal spray [Pharmacy Med Name: AZELASTINE 0.1% (137 MCG) SPRY] 90 mL 0    Sig: Place 1 spray 2 (two) times daily into both nostrils. Use in each nostril as directed     Ear, Nose, and Throat: Nasal Preparations - Antiallergy Passed - 09/22/2021 10:17 AM      Passed - Valid encounter within last 12 months    Recent Outpatient Visits          4 months ago [place], NP   4 months ago Benign essential HTN   [place], Megan P, DO   10 months ago Mixed hyperlipidemia   [place], [place], DO   1 year ago Encounter for [employer] annual wellness exam   [place], DO   1 year ago Strain of abdominal muscle, initial encounter   [place], Megan P, DO      Future Appointments            In 5 months [facility]

## 2021-10-10 DIAGNOSIS — M25512 Pain in left shoulder: Secondary | ICD-10-CM | POA: Diagnosis not present

## 2021-10-10 DIAGNOSIS — M542 Cervicalgia: Secondary | ICD-10-CM | POA: Diagnosis not present

## 2021-10-11 ENCOUNTER — Other Ambulatory Visit: Payer: Self-pay

## 2021-10-11 ENCOUNTER — Ambulatory Visit (INDEPENDENT_AMBULATORY_CARE_PROVIDER_SITE_OTHER): Payer: Medicare Other | Admitting: Dermatology

## 2021-10-11 DIAGNOSIS — L820 Inflamed seborrheic keratosis: Secondary | ICD-10-CM

## 2021-10-11 DIAGNOSIS — D492 Neoplasm of unspecified behavior of bone, soft tissue, and skin: Secondary | ICD-10-CM

## 2021-10-11 DIAGNOSIS — L720 Epidermal cyst: Secondary | ICD-10-CM

## 2021-10-11 MED ORDER — MUPIROCIN 2 % EX OINT: 1.0000 "application " | TOPICAL_OINTMENT | Freq: Every day | CUTANEOUS | 0 refills | Status: DC

## 2021-10-11 NOTE — Progress Notes (Signed)
Follow-Up Visit   Subjective  Nancy Mack is a 75 y.o. female who presents for the following: cyst left mons pubis, genital area pt presents for surgery of one).  She also has one cyst of the right genital labia that she will get treated with excision next week.  She has a spot on her breast she would like checked today.  It seemed to get irritated and drained but now is improved but still present.  The following portions of the chart were reviewed this encounter and updated as appropriate:   Tobacco  Allergies  Meds  Problems  Med Hx  Surg Hx  Fam Hx     Review of Systems:  No other skin or systemic complaints except as noted in HPI or Assessment and Plan.  Objective  Well appearing patient in no apparent distress; mood and affect are within normal limits.  A focused examination was performed including groin. Relevant physical exam findings are noted in the Assessment and Plan.  Left genital mons pubis Cystic pap 1.5 x 1.5 cm  Left Breast Erythematous keratotic or waxy stuck-on papule or plaque.   Right labia Subcutaneous nodule.    Assessment & Plan  Neoplasm of skin Left genital mons pubis  Skin excision  Lesion length (cm):  1.5 Lesion width (cm):  1.5 Margin per side (cm):  0 Total excision diameter (cm):  1.5 Informed consent: discussed and consent obtained   Timeout: patient name, date of birth, surgical site, and procedure verified   Procedure prep:  Patient was prepped and draped in usual sterile fashion Prep type:  Isopropyl alcohol and povidone-iodine Anesthesia: the lesion was anesthetized in a standard fashion   Anesthetic:  1% lidocaine w/ epinephrine 1-100,000 buffered w/ 8.4% NaHCO3 Instrument used: #15 blade   Hemostasis achieved with: pressure   Hemostasis achieved with comment:  Electrocautery Outcome: patient tolerated procedure well with no complications   Post-procedure details: sterile dressing applied and wound care instructions  given   Dressing type: bandage and pressure dressing (Mupirocin)    Skin repair Complexity:  Complex Final length (cm):  2.6 Reason for type of repair: reduce tension to allow closure, reduce the risk of dehiscence, infection, and necrosis, reduce subcutaneous dead space and avoid a hematoma, allow closure of the large defect, preserve normal anatomy, preserve normal anatomical and functional relationships and enhance both functionality and cosmetic results   Undermining: area extensively undermined   Undermining comment:  Undermining Defect Subcutaneous layers (deep stitches):  Suture size:  4-0 Suture type: Vicryl (polyglactin 910)   Subcutaneous suture technique: Inverted Dermal. Fine/surface layer approximation (top stitches):  Suture size:  4-0 Suture type: nylon   Stitches: simple running   Suture removal (days):  7 Hemostasis achieved with: pressure Outcome: patient tolerated procedure well with no complications   Post-procedure details: sterile dressing applied and wound care instructions given   Dressing type: bandage, pressure dressing and bacitracin (Mupirocin)    mupirocin ointment (BACTROBAN) 2 % Apply 1 application topically daily. With dressing changes  Specimen 1 - Surgical pathology Differential Diagnosis: D48.5 Cyst vs other  Check Margins: No Cystic pap 1.5 x 1.5cm  Inflamed seborrheic keratosis Left Breast  Will plan to treat in the future  Epidermal inclusion cyst Right labia  Will plan excision on follow up  Return in about 1 week (around 10/18/2021) for suture removal.  Documentation: I have reviewed the above documentation for accuracy and completeness, and I agree with the above.  Sarina Ser,  MD

## 2021-10-11 NOTE — Patient Instructions (Signed)
Wound Care Instructions  Cleanse wound gently with soap and water once a day then pat dry with clean gauze. Apply a thing coat of Petrolatum (petroleum jelly, "Vaseline") over the wound (unless you have an allergy to this). We recommend that you use a new, sterile tube of Vaseline. Do not pick or remove scabs. Do not remove the yellow or white "healing tissue" from the base of the wound.  Cover the wound with fresh, clean, nonstick gauze and secure with paper tape. You may use Band-Aids in place of gauze and tape if the would is small enough, but would recommend trimming much of the tape off as there is often too much. Sometimes Band-Aids can irritate the skin.  You should call the office for your biopsy report after 1 week if you have not already been contacted.  If you experience any problems, such as abnormal amounts of bleeding, swelling, significant bruising, significant pain, or evidence of infection, please call the office immediately.  FOR ADULT SURGERY PATIENTS: If you need something for pain relief you may take 1 extra strength Tylenol (acetaminophen) AND 2 Ibuprofen (200mg each) together every 4 hours as needed for pain. (do not take these if you are allergic to them or if you have a reason you should not take them.) Typically, you may only need pain medication for 1 to 3 days.   If you have any questions or concerns for your doctor, please call our main line at [phone] and press option 4 to reach your doctor's medical assistant. If no one answers, please leave a voicemail as directed and we will return your call as soon as possible. Messages left after 4 pm will be answered the following business day.   You may also send us a message via MyChart. We typically respond to MyChart messages within 1-2 business days.  For prescription refills, please ask your pharmacy to contact our office. Our fax number is [phone].  If you have an urgent issue when the clinic is closed that  cannot wait until the next business day, you can page your doctor at the number below.    Please note that while we do our best to be available for urgent issues outside of office hours, we are not available 24/7.   If you have an urgent issue and are unable to reach us, you may choose to seek medical care at your doctor's office, retail clinic, urgent care center, or emergency room.  If you have a medical emergency, please immediately call 911 or go to the emergency department.  Pager Numbers  - Dr. Kowalski: [phone]  - Dr. Moye: [phone]  - Dr. Stewart: [phone]  In the event of inclement weather, please call our main line at [phone] for an update on the status of any delays or closures.  Dermatology Medication Tips: Please keep the boxes that topical medications come in in order to help keep track of the instructions about where and how to use these. Pharmacies typically print the medication instructions only on the boxes and not directly on the medication tubes.   If your medication is too expensive, please contact our office at [phone] option 4 or send us a message through MyChart.   We are unable to tell what your co-pay for medications will be in advance as this is different depending on your insurance coverage. However, we may be able to find a substitute medication at lower cost or fill out paperwork to get insurance to cover a needed   medication.   If a prior authorization is required to get your medication covered by your insurance company, please allow us 1-2 business days to complete this process.  Drug prices often vary depending on where the prescription is filled and some pharmacies may offer cheaper prices.  The website www.goodrx.com contains coupons for medications through different pharmacies. The prices here do not account for what the cost may be with help from insurance (it may be cheaper with your insurance), but the website can give you the  price if you did not use any insurance.  - You can print the associated coupon and take it with your prescription to the pharmacy.  - You may also stop by our office during regular business hours and pick up a GoodRx coupon card.  - If you need your prescription sent electronically to a different pharmacy, notify our office through [place] MyChart or by phone at [phone] option 4.   

## 2021-10-12 ENCOUNTER — Encounter: Payer: Self-pay | Admitting: Dermatology

## 2021-10-12 ENCOUNTER — Telehealth: Payer: Self-pay

## 2021-10-12 NOTE — Telephone Encounter (Signed)
Left message for patient regarding surgery/hd  

## 2021-10-18 ENCOUNTER — Ambulatory Visit (INDEPENDENT_AMBULATORY_CARE_PROVIDER_SITE_OTHER): Payer: Medicare Other | Admitting: Dermatology

## 2021-10-18 ENCOUNTER — Encounter: Payer: Self-pay | Admitting: Dermatology

## 2021-10-18 ENCOUNTER — Other Ambulatory Visit: Payer: Self-pay

## 2021-10-18 DIAGNOSIS — M25512 Pain in left shoulder: Secondary | ICD-10-CM | POA: Diagnosis not present

## 2021-10-18 DIAGNOSIS — M24812 Other specific joint derangements of left shoulder, not elsewhere classified: Secondary | ICD-10-CM | POA: Diagnosis not present

## 2021-10-18 DIAGNOSIS — M19012 Primary osteoarthritis, left shoulder: Secondary | ICD-10-CM | POA: Diagnosis not present

## 2021-10-18 DIAGNOSIS — L918 Other hypertrophic disorders of the skin: Secondary | ICD-10-CM | POA: Diagnosis not present

## 2021-10-18 DIAGNOSIS — L720 Epidermal cyst: Secondary | ICD-10-CM | POA: Diagnosis not present

## 2021-10-18 DIAGNOSIS — D485 Neoplasm of uncertain behavior of skin: Secondary | ICD-10-CM

## 2021-10-18 DIAGNOSIS — M67814 Other specified disorders of tendon, left shoulder: Secondary | ICD-10-CM | POA: Diagnosis not present

## 2021-10-18 DIAGNOSIS — M94212 Chondromalacia, left shoulder: Secondary | ICD-10-CM | POA: Diagnosis not present

## 2021-10-18 NOTE — Patient Instructions (Signed)
Wound Care Instructions  Cleanse wound gently with soap and water once a day then pat dry with clean gauze. Apply a thing coat of Petrolatum (petroleum jelly, "Vaseline") over the wound (unless you have an allergy to this). We recommend that you use a new, sterile tube of Vaseline. Do not pick or remove scabs. Do not remove the yellow or white "healing tissue" from the base of the wound.  Cover the wound with fresh, clean, nonstick gauze and secure with paper tape. You may use Band-Aids in place of gauze and tape if the would is small enough, but would recommend trimming much of the tape off as there is often too much. Sometimes Band-Aids can irritate the skin.  You should call the office for your biopsy report after 1 week if you have not already been contacted.  If you experience any problems, such as abnormal amounts of bleeding, swelling, significant bruising, significant pain, or evidence of infection, please call the office immediately.  FOR ADULT SURGERY PATIENTS: If you need something for pain relief you may take 1 extra strength Tylenol (acetaminophen) AND 2 Ibuprofen (200mg each) together every 4 hours as needed for pain. (do not take these if you are allergic to them or if you have a reason you should not take them.) Typically, you may only need pain medication for 1 to 3 days.   If you have any questions or concerns for your doctor, please call our main line at [phone] and press option 4 to reach your doctor's medical assistant. If no one answers, please leave a voicemail as directed and we will return your call as soon as possible. Messages left after 4 pm will be answered the following business day.   You may also send us a message via MyChart. We typically respond to MyChart messages within 1-2 business days.  For prescription refills, please ask your pharmacy to contact our office. Our fax number is [phone].  If you have an urgent issue when the clinic is closed that  cannot wait until the next business day, you can page your doctor at the number below.    Please note that while we do our best to be available for urgent issues outside of office hours, we are not available 24/7.   If you have an urgent issue and are unable to reach us, you may choose to seek medical care at your doctor's office, retail clinic, urgent care center, or emergency room.  If you have a medical emergency, please immediately call 911 or go to the emergency department.  Pager Numbers  - Dr. Kowalski: [phone]  - Dr. Moye: [phone]  - Dr. Stewart: [phone]  In the event of inclement weather, please call our main line at [phone] for an update on the status of any delays or closures.  Dermatology Medication Tips: Please keep the boxes that topical medications come in in order to help keep track of the instructions about where and how to use these. Pharmacies typically print the medication instructions only on the boxes and not directly on the medication tubes.   If your medication is too expensive, please contact our office at [phone] option 4 or send us a message through MyChart.   We are unable to tell what your co-pay for medications will be in advance as this is different depending on your insurance coverage. However, we may be able to find a substitute medication at lower cost or fill out paperwork to get insurance to cover a needed   medication.   If a prior authorization is required to get your medication covered by your insurance company, please allow us 1-2 business days to complete this process.  Drug prices often vary depending on where the prescription is filled and some pharmacies may offer cheaper prices.  The website www.goodrx.com contains coupons for medications through different pharmacies. The prices here do not account for what the cost may be with help from insurance (it may be cheaper with your insurance), but the website can give you the  price if you did not use any insurance.  - You can print the associated coupon and take it with your prescription to the pharmacy.  - You may also stop by our office during regular business hours and pick up a GoodRx coupon card.  - If you need your prescription sent electronically to a different pharmacy, notify our office through [place] MyChart or by phone at [phone] option 4.   

## 2021-10-18 NOTE — Progress Notes (Signed)
Follow-Up Visit   Subjective  Nancy Mack is a 75 y.o. female who presents for the following: Cyst (Vs other of right labia - excise today) and Follow-up (Post op - cyst of left genital mons pubis). She also has a spot on her left breast areola that is changing and she would like removed.  The following portions of the chart were reviewed this encounter and updated as appropriate:   Tobacco  Allergies  Meds  Problems  Med Hx  Surg Hx  Fam Hx     Review of Systems:  No other skin or systemic complaints except as noted in HPI or Assessment and Plan.  Objective  Well appearing patient in no apparent distress; mood and affect are within normal limits.  A focused examination was performed including genital area, left breast. Relevant physical exam findings are noted in the Assessment and Plan.  Right Labia 1.2 cm cystic papule  Left inf areola 0.6 cm flesh colored papule  Left genital mons pubis Healing excision site   Assessment & Plan  Neoplasm of uncertain behavior of skin (2) Right Labia  Skin excision  Lesion length (cm):  1.2 Lesion width (cm):  1.2 Margin per side (cm):  0 Total excision diameter (cm):  1.2 Informed consent: discussed and consent obtained   Timeout: patient name, date of birth, surgical site, and procedure verified   Procedure prep:  Patient was prepped and draped in usual sterile fashion Prep type:  Isopropyl alcohol and povidone-iodine Anesthesia: the lesion was anesthetized in a standard fashion   Anesthetic:  1% lidocaine w/ epinephrine 1-100,000 buffered w/ 8.4% NaHCO3 Instrument used: #15 blade   Hemostasis achieved with: pressure   Hemostasis achieved with comment:  Electrocautery Outcome: patient tolerated procedure well with no complications   Post-procedure details: sterile dressing applied and wound care instructions given   Dressing type: bandage and pressure dressing (mupirocin)    Specimen 1 - Surgical  pathology Differential Diagnosis: Cyst vs other Check Margins: No  Left inf areola  Epidermal / dermal shaving  Lesion diameter (cm):  0.6 Informed consent: discussed and consent obtained   Timeout: patient name, date of birth, surgical site, and procedure verified   Procedure prep:  Patient was prepped and draped in usual sterile fashion Prep type:  Isopropyl alcohol Anesthesia: the lesion was anesthetized in a standard fashion   Anesthetic:  1% lidocaine w/ epinephrine 1-100,000 buffered w/ 8.4% NaHCO3 Instrument used: flexible razor blade   Hemostasis achieved with: pressure, aluminum chloride and electrodesiccation   Outcome: patient tolerated procedure well   Post-procedure details: sterile dressing applied and wound care instructions given   Dressing type: bandage and petrolatum    Specimen 2 - Surgical pathology Differential Diagnosis: Inflamed SK vs other  Check Margins: No  Epidermal inclusion cyst Left genital mons pubis  Encounter for Removal of Sutures - Incision site at the left genital mons pubis is clean, dry and intact - Wound cleansed, sutures removed, wound cleansed.  - Discussed pathology results showing cyst  - Scars remodel for a full year. - Patient can apply over-the-counter silicone scar cream each night to help with scar remodeling if desired. - Patient advised to call with any concerns or if they notice any new or changing lesions.   Return in about 2 months (around 12/18/2021) for Follow up.  I, Ashok Cordia, CMA, am acting as scribe for Sarina Ser, MD .  Documentation: I have reviewed the above documentation for accuracy and completeness,  and I agree with the above.  Sarina Ser, MD

## 2021-10-19 ENCOUNTER — Telehealth: Payer: Self-pay

## 2021-10-19 NOTE — Telephone Encounter (Signed)
Patient doing fine after yesterdays surgery./sh 

## 2021-10-21 ENCOUNTER — Telehealth: Payer: Self-pay

## 2021-10-21 DIAGNOSIS — M19012 Primary osteoarthritis, left shoulder: Secondary | ICD-10-CM | POA: Diagnosis not present

## 2021-10-21 NOTE — Telephone Encounter (Signed)
Patient informed of pathology results 

## 2021-10-21 NOTE — Telephone Encounter (Signed)
-----   Message from Ralene Bathe, MD sent at 10/20/2021  5:00 PM EDT ----- Diagnosis 1. Skin (M), right labia EXCISION, EPIDERMOID CYST 2. Skin (M), left inf areola [place], INFARCTED  1- benign cyst 2- benign skin tag

## 2021-11-08 DIAGNOSIS — H40009 Preglaucoma, unspecified, unspecified eye: Secondary | ICD-10-CM | POA: Diagnosis not present

## 2021-11-24 ENCOUNTER — Other Ambulatory Visit: Payer: Self-pay | Admitting: Family Medicine

## 2021-11-24 NOTE — Telephone Encounter (Signed)
Requested Prescriptions  Pending Prescriptions Disp Refills  . albuterol (VENTOLIN HFA) 108 (90 Base) MCG/ACT inhaler [Pharmacy Med Name: ALBUTEROL HFA 90 MCG INHALER] 25.5 g 0    Sig: Inhale 2 puffs into the lungs every 6 (six) hours as needed.     Pulmonology:  Beta Agonists Failed - 11/24/2021 10:58 AM      Failed - One inhaler should last at least one month. If the patient is requesting refills earlier, contact the patient to check for uncontrolled symptoms.      Passed - Valid encounter within last 12 months    Recent Outpatient Visits          6 months ago [place], NP   6 months ago Benign essential HTN   [place], [place], DO   1 year ago Mixed hyperlipidemia   [facility] [place], [place], DO   1 year ago Encounter for [employer] annual wellness exam   [place], DO   1 year ago Strain of abdominal muscle, initial encounter   [facility] Valerie Roys, DO      Future Appointments            In 3 weeks Ralene Bathe, MD [place]   In 3 months  [facility], [place]

## 2021-12-07 ENCOUNTER — Other Ambulatory Visit: Payer: Self-pay | Admitting: Family Medicine

## 2021-12-07 NOTE — Telephone Encounter (Signed)
Requested medication (s) are due for refill today: yes  Requested medication (s) are on the active medication list: yes  Last refill:   90 tablet 1 05/04/2021    Future visit scheduled: Yes  Notes to clinic:  mammogram isnt up to date. Please advise     Requested Prescriptions  Pending Prescriptions Disp Refills   estradiol (ESTRACE) 1 MG tablet [Pharmacy Med Name: ESTRADIOL 1 MG TABLET] 90 tablet 0    Sig: Take 1 tablet (1 mg total) by mouth daily.     OB/GYN:  Estrogens Failed - 12/07/2021 12:14 PM      Failed - Mammogram is up-to-date per Health Maintenance      Passed - Last BP in normal range    BP Readings from Last 1 Encounters:  05/11/21 119/72          Passed - Valid encounter within last 12 months    Recent Outpatient Visits           7 months ago [place], Santiago Glad, NP   7 months ago Benign essential HTN   [place], [place], DO   1 year ago Mixed hyperlipidemia   [facility] [place], [place], DO   1 year ago Encounter for [employer] annual wellness exam   [place], [place], DO   1 year ago Strain of abdominal muscle, initial encounter   [facility] Valerie Roys, DO       Future Appointments             In 2 weeks Ralene Bathe, MD [place]   In 2 months  [place], [facility]            Signed Prescriptions Disp Refills   ALLERGY RELIEF 180 MG tablet 90 tablet 0    Sig: Take 1 tablet (180 mg total) by mouth daily.     Ear, Nose, and Throat:  Antihistamines Passed - 12/07/2021 12:14 PM      Passed - Valid encounter within last 12 months    Recent Outpatient Visits           7 months ago [place], NP   7 months ago Benign essential HTN   [place], [place], DO   1 year ago Mixed hyperlipidemia   [facility] [place], [place],  DO   1 year ago Encounter for [employer] annual wellness exam   [place], DO   1 year ago Strain of abdominal muscle, initial encounter   [facility] Valerie Roys, DO       Future Appointments             In 2 weeks Ralene Bathe, MD [place]   In 2 months  [facility], [place]

## 2021-12-07 NOTE — Telephone Encounter (Signed)
Requested Prescriptions  Pending Prescriptions Disp Refills   ALLERGY RELIEF 180 MG tablet [Pharmacy Med Name: ALLERGY RELIEF 180 MG TABLET] 100 tablet 0    Sig: Take 1 tablet (180 mg total) by mouth daily.     Ear, Nose, and Throat:  Antihistamines Passed - 12/07/2021 12:14 PM      Passed - Valid encounter within last 12 months    Recent Outpatient Visits          7 months ago [place], NP   7 months ago Benign essential HTN   [place], [place], DO   1 year ago Mixed hyperlipidemia   [facility] [place], Megan P, DO   1 year ago Encounter for [employer] annual wellness exam   [place], DO   1 year ago Strain of abdominal muscle, initial encounter   [facility] Valerie Roys, DO      Future Appointments            In 2 weeks Ralene Bathe, MD [place]   In 2 months  [place], [facility]            estradiol (ESTRACE) 1 MG tablet [Pharmacy Med Name: ESTRADIOL 1 MG TABLET] 90 tablet 0    Sig: Take 1 tablet (1 mg total) by mouth daily.     OB/GYN:  Estrogens Failed - 12/07/2021 12:14 PM      Failed - Mammogram is up-to-date per Health Maintenance      Passed - Last BP in normal range    BP Readings from Last 1 Encounters:  05/11/21 119/72         Passed - Valid encounter within last 12 months    Recent Outpatient Visits          7 months ago [place], Santiago Glad, NP   7 months ago Benign essential HTN   [place], [place], DO   1 year ago Mixed hyperlipidemia   [facility] [place], [place], DO   1 year ago Encounter for [employer] annual wellness exam   [place], DO   1 year ago Strain of abdominal muscle, initial encounter   [facility] Valerie Roys, DO      Future Appointments            In  2 weeks Ralene Bathe, MD [place]   In 2 months  [facility], [place]

## 2021-12-07 NOTE — Telephone Encounter (Signed)
Over due for follow up

## 2021-12-08 NOTE — Telephone Encounter (Signed)
Pt is scheduled for 12/27.

## 2021-12-13 ENCOUNTER — Ambulatory Visit (INDEPENDENT_AMBULATORY_CARE_PROVIDER_SITE_OTHER): Payer: Medicare Other | Admitting: Family Medicine

## 2021-12-13 ENCOUNTER — Encounter: Payer: Self-pay | Admitting: Family Medicine

## 2021-12-13 ENCOUNTER — Other Ambulatory Visit: Payer: Self-pay

## 2021-12-13 VITALS — BP 136/79 | HR 80 | Temp 98.2°F | Ht 62.5 in | Wt 158.8 lb

## 2021-12-13 DIAGNOSIS — E782 Mixed hyperlipidemia: Secondary | ICD-10-CM | POA: Diagnosis not present

## 2021-12-13 DIAGNOSIS — J849 Interstitial pulmonary disease, unspecified: Secondary | ICD-10-CM

## 2021-12-13 DIAGNOSIS — N1831 Chronic kidney disease, stage 3a: Secondary | ICD-10-CM | POA: Diagnosis not present

## 2021-12-13 DIAGNOSIS — I10 Essential (primary) hypertension: Secondary | ICD-10-CM | POA: Diagnosis not present

## 2021-12-13 DIAGNOSIS — E559 Vitamin D deficiency, unspecified: Secondary | ICD-10-CM | POA: Diagnosis not present

## 2021-12-13 DIAGNOSIS — Z780 Asymptomatic menopausal state: Secondary | ICD-10-CM | POA: Diagnosis not present

## 2021-12-13 DIAGNOSIS — K219 Gastro-esophageal reflux disease without esophagitis: Secondary | ICD-10-CM | POA: Diagnosis not present

## 2021-12-13 DIAGNOSIS — I700 Atherosclerosis of aorta: Secondary | ICD-10-CM | POA: Diagnosis not present

## 2021-12-13 MED ORDER — MONTELUKAST SODIUM 10 MG PO TABS: 10.0000 mg | ORAL_TABLET | Freq: Every evening | ORAL | 1 refills | Status: DC

## 2021-12-13 MED ORDER — PANTOPRAZOLE SODIUM 20 MG PO TBEC: 20.0000 mg | DELAYED_RELEASE_TABLET | Freq: Two times a day (BID) | ORAL | 1 refills | Status: DC

## 2021-12-13 MED ORDER — HYDROCHLOROTHIAZIDE 25 MG PO TABS: 25.0000 mg | ORAL_TABLET | Freq: Every day | ORAL | 1 refills | Status: DC

## 2021-12-13 MED ORDER — ATORVASTATIN CALCIUM 40 MG PO TABS: 40.0000 mg | ORAL_TABLET | Freq: Every day | ORAL | 1 refills | Status: DC

## 2021-12-13 MED ORDER — FLUTICASONE-SALMETEROL 500-50 MCG/ACT IN AEPB: 1.0000 | INHALATION_SPRAY | Freq: Two times a day (BID) | RESPIRATORY_TRACT | 3 refills | Status: DC

## 2021-12-13 MED ORDER — ESTRADIOL 0.5 MG PO TABS: 0.5000 mg | ORAL_TABLET | Freq: Every day | ORAL | 1 refills | Status: DC

## 2021-12-13 MED ORDER — ALBUTEROL SULFATE HFA 108 (90 BASE) MCG/ACT IN AERS: 2.0000 | INHALATION_SPRAY | Freq: Four times a day (QID) | RESPIRATORY_TRACT | 12 refills | Status: DC | PRN

## 2021-12-13 NOTE — Assessment & Plan Note (Signed)
Rechecking labs today. Await results. Treat as needed.  °

## 2021-12-13 NOTE — Assessment & Plan Note (Signed)
Under good control on current regimen. Continue current regimen. Continue to monitor. Call with any concerns. Refills given. Labs drawn today.   

## 2021-12-13 NOTE — Assessment & Plan Note (Signed)
Will keep BP and cholesterol under good control. Continue to monitor. Call with any concerns.  

## 2021-12-13 NOTE — Assessment & Plan Note (Signed)
Will drop her estradiol to 0.5mg  daily and recheck 6 months. Goal of stopping.

## 2021-12-13 NOTE — Progress Notes (Signed)
° °BP 136/79    Pulse 80    Temp 98.2 °F (36.8 °C) (Oral)    Ht 5' 2.5" (1.588 m)    Wt 158 lb 12.8 oz (72 kg)    LMP 07/31/1981 (Approximate)    SpO2 98%    BMI 28.58 kg/m²   ° °Subjective:  ° ° Patient ID: Nancy Mack, female    DOB: 06/30/1946, 75 y.o.   MRN: [id number] ° °HPI: °Nancy Mack is a 75 y.o. female ° °Chief Complaint  °Patient presents with  ° Hyperlipidemia  ° Hypertension  ° °HYPERTENSION / HYPERLIPIDEMIA °Satisfied with current treatment? yes °Duration of hypertension: chronic °BP monitoring frequency: not checking °BP medication side effects: no °Past BP meds: HCTZ °Duration of hyperlipidemia: chronic °Cholesterol medication side effects: no °Cholesterol supplements: none °Past cholesterol medications: atorvastatin °Medication compliance: excellent compliance °Aspirin: yes °Recent stressors: no °Recurrent headaches: no °Visual changes: no °Palpitations: no °Dyspnea: no °Chest pain: no °Lower extremity edema: no °Dizzy/lightheaded: no ° °COPD °COPD status: controlled °Satisfied with current treatment?: yes °Oxygen use: no °Dyspnea frequency: occasionally °Cough frequency: occasionally °Rescue inhaler frequency:  occasionally °Limitation of activity: no °Productive cough: no °Pneumovax: Up to Date °Influenza: Up to Date ° °Has been feeling well. No hot flashes, no night sweats. No other concerns.  ° °Relevant past medical, surgical, family and social history reviewed and updated as indicated. Interim medical history since our last visit reviewed. °Allergies and medications reviewed and updated. ° °Review of Systems  °Constitutional: Negative.   °Respiratory: Negative.    °Cardiovascular: Negative.   °Musculoskeletal:  Positive for arthralgias. Negative for back pain, gait problem, joint swelling, myalgias, neck pain and neck stiffness.  °Skin: Negative.   °Neurological: Negative.   °Psychiatric/Behavioral: Negative.    ° °Per HPI unless specifically indicated above ° °   °Objective:  °   °BP 136/79    Pulse 80    Temp 98.2 °F (36.8 °C) (Oral)    Ht 5' 2.5" (1.588 m)    Wt 158 lb 12.8 oz (72 kg)    LMP 07/31/1981 (Approximate)    SpO2 98%    BMI 28.58 kg/m²   °Wt Readings from Last 3 Encounters:  °12/13/21 158 lb 12.8 oz (72 kg)  °05/04/21 157 lb 3.2 oz (71.3 kg)  °03/04/21 150 lb (68 kg)  °  °Physical Exam °Vitals and nursing note reviewed.  °Constitutional:   °   General: She is not in acute distress. °   Appearance: Normal appearance. She is not ill-appearing, toxic-appearing or diaphoretic.  °HENT:  °   Head: Normocephalic and atraumatic.  °   Right Ear: External ear normal.  °   Left Ear: External ear normal.  °   Nose: Nose normal.  °   Mouth/Throat:  °   Mouth: Mucous membranes are moist.  °   Pharynx: Oropharynx is clear.  °Eyes:  °   General: No scleral icterus.    °   Right eye: No discharge.     °   Left eye: No discharge.  °   Extraocular Movements: Extraocular movements intact.  °   Conjunctiva/sclera: Conjunctivae normal.  °   Pupils: Pupils are equal, round, and reactive to light.  °Cardiovascular:  °   Rate and Rhythm: Normal rate and regular rhythm.  °   Pulses: Normal pulses.  °   Heart sounds: Normal heart sounds. No murmur heard. °  No friction rub. No gallop.  °Pulmonary:  °     Effort: Pulmonary effort is normal. No respiratory distress.  °   Breath sounds: Normal breath sounds. No stridor. No wheezing, rhonchi or rales.  °Chest:  °   Chest wall: No tenderness.  °Musculoskeletal:     °   General: Normal range of motion.  °   Cervical back: Normal range of motion and neck supple.  °Skin: °   General: Skin is warm and dry.  °   Capillary Refill: Capillary refill takes less than 2 seconds.  °   Coloration: Skin is not jaundiced or pale.  °   Findings: No bruising, erythema, lesion or rash.  °Neurological:  °   General: No focal deficit present.  °   Mental Status: She is alert and oriented to person, place, and time. Mental status is at baseline.  °Psychiatric:     °   Mood and  Affect: Mood normal.     °   Behavior: Behavior normal.     °   Thought Content: Thought content normal.     °   Judgment: Judgment normal.  ° ° °Results for orders placed or performed in visit on 05/04/21  °CBC with Differential/Platelet  °Result Value Ref Range  ° WBC 7.3 3.4 - 10.8 x10E3/uL  ° RBC 4.59 3.77 - 5.28 x10E6/uL  ° Hemoglobin 12.0 11.1 - 15.9 g/dL  ° Hematocrit 37.9 34.0 - 46.6 %  ° MCV 83 79 - 97 fL  ° MCH 26.1 (L) 26.6 - 33.0 pg  ° MCHC 31.7 31.5 - 35.7 g/dL  ° RDW 14.7 11.7 - 15.4 %  ° Platelets 368 150 - 450 x10E3/uL  ° Neutrophils 66 Not Estab. %  ° Lymphs 21 Not Estab. %  ° Monocytes 7 Not Estab. %  ° Eos 5 Not Estab. %  ° Basos 1 Not Estab. %  ° Neutrophils Absolute 4.8 1.4 - 7.0 x10E3/uL  ° Lymphocytes Absolute 1.5 0.7 - 3.1 x10E3/uL  ° Monocytes Absolute 0.5 0.1 - 0.9 x10E3/uL  ° EOS (ABSOLUTE) 0.3 0.0 - 0.4 x10E3/uL  ° Basophils Absolute 0.1 0.0 - 0.2 x10E3/uL  ° Immature Granulocytes 0 Not Estab. %  ° Immature Grans (Abs) 0.0 0.0 - 0.1 x10E3/uL  °Comprehensive metabolic panel  °Result Value Ref Range  ° Glucose 93 65 - 99 mg/dL  ° BUN 16 8 - 27 mg/dL  ° Creatinine, Ser 0.94 0.57 - 1.00 mg/dL  ° eGFR 64 >59 mL/min/1.73  ° BUN/Creatinine Ratio 17 12 - 28  ° Sodium 141 134 - 144 mmol/L  ° Potassium 3.6 3.5 - 5.2 mmol/L  ° Chloride 102 96 - 106 mmol/L  ° CO2 25 20 - 29 mmol/L  ° Calcium 9.2 8.7 - 10.3 mg/dL  ° Total Protein 6.2 6.0 - 8.5 g/dL  ° Albumin 4.0 3.7 - 4.7 g/dL  ° Globulin, Total 2.2 1.5 - 4.5 g/dL  ° Albumin/Globulin Ratio 1.8 1.2 - 2.2  ° Bilirubin Total 0.3 0.0 - 1.2 mg/dL  ° Alkaline Phosphatase 77 44 - 121 IU/L  ° AST 19 0 - 40 IU/L  ° ALT 12 0 - 32 IU/L  °Lipid Panel w/o Chol/HDL Ratio  °Result Value Ref Range  ° Cholesterol, Total 184 100 - 199 mg/dL  ° Triglycerides 121 0 - 149 mg/dL  ° HDL 73 >39 mg/dL  ° VLDL Cholesterol Cal 21 5 - 40 mg/dL  ° LDL Chol Calc (NIH) 90 0 - 99 mg/dL  °Microalbumin, Urine Waived  °Result Value Ref Range  °   Microalb, Ur Waived 10 0 - 19 mg/L  °  Creatinine, Urine Waived 100 10 - 300 mg/dL  ° Microalb/Creat Ratio <30 <30 mg/g  °TSH  °Result Value Ref Range  ° TSH 1.090 0.450 - 4.500 uIU/mL  °Urinalysis, Routine w reflex microscopic  °Result Value Ref Range  ° Specific Gravity, UA 1.015 1.005 - 1.030  ° pH, UA 5.5 5.0 - 7.5  ° Color, UA Yellow Yellow  ° Appearance Ur Cloudy (A) Clear  ° Leukocytes,UA Negative Negative  ° Protein,UA Negative Negative/Trace  ° Glucose, UA Negative Negative  ° Ketones, UA Negative Negative  ° RBC, UA Negative Negative  ° Bilirubin, UA Negative Negative  ° Urobilinogen, Ur 0.2 0.2 - 1.0 mg/dL  ° Nitrite, UA Negative Negative  °VITAMIN D 25 Hydroxy (Vit-D Deficiency, Fractures)  °Result Value Ref Range  ° Vit D, 25-Hydroxy 39.6 30.0 - 100.0 ng/mL  ° °   °Assessment & Plan:  ° °Problem List Items Addressed This Visit   ° °  ° Cardiovascular and Mediastinum  ° Benign essential HTN - Primary  °  Under good control on current regimen. Continue current regimen. Continue to monitor. Call with any concerns. Refills given. Labs drawn today.  ° °  °  ° Relevant Medications  ° atorvastatin (LIPITOR) 40 MG tablet  ° hydrochlorothiazide (HYDRODIURIL) 25 MG tablet  ° Other Relevant Orders  ° Comprehensive metabolic panel  ° CBC with Differential/Platelet  ° Aortic atherosclerosis (HCC)  °  Will keep BP and cholesterol under good control. Continue to monitor. Call with any concerns.  °  °  ° Relevant Medications  ° atorvastatin (LIPITOR) 40 MG tablet  ° hydrochlorothiazide (HYDRODIURIL) 25 MG tablet  °  ° Respiratory  ° ILD (interstitial lung disease) (HCC)  °  Under good control on current regimen. Continue current regimen. Continue to monitor. Call with any concerns. Refills given. Labs drawn today.  °  °  °  ° Digestive  ° GERD (gastroesophageal reflux disease)  °  Under good control on current regimen. Continue current regimen. Continue to monitor. Call with any concerns. Refills given. Labs drawn today.  °  °  ° Relevant Medications  °  pantoprazole (PROTONIX) 20 MG tablet  ° Other Relevant Orders  ° Comprehensive metabolic panel  ° CBC with Differential/Platelet  °  ° Genitourinary  ° Stage 3a chronic kidney disease (HCC)  °  Rechecking labs today. Await results. Treat as needed.  °  °  ° Relevant Orders  ° Comprehensive metabolic panel  ° CBC with Differential/Platelet  °  ° Other  ° Hyperlipidemia  °  Under good control on current regimen. Continue current regimen. Continue to monitor. Call with any concerns. Refills given. Labs drawn today. ° °  °  ° Relevant Medications  ° atorvastatin (LIPITOR) 40 MG tablet  ° hydrochlorothiazide (HYDRODIURIL) 25 MG tablet  ° Other Relevant Orders  ° Comprehensive metabolic panel  ° CBC with Differential/Platelet  ° Lipid Panel w/o Chol/HDL Ratio  ° Menopause  °  Will drop her estradiol to 0.5mg daily and recheck 6 months. Goal of stopping. °  °  ° Vitamin D deficiency  °  Rechecking labs today. Await results. Treat as needed.  °  °  ° Relevant Orders  ° Comprehensive metabolic panel  ° CBC with Differential/Platelet  ° VITAMIN D 25 Hydroxy (Vit-D Deficiency, Fractures)  °  ° °Follow up plan: °Return in about 6 months (around 06/13/2022). ° ° ° ° ° °

## 2021-12-14 LAB — CBC WITH DIFFERENTIAL/PLATELET
Basophils Absolute: 0.1 10*3/uL (ref 0.0–0.2)
Basos: 1 %
EOS (ABSOLUTE): 0.4 10*3/uL (ref 0.0–0.4)
Eos: 6 %
Hematocrit: 40.2 % (ref 34.0–46.6)
Hemoglobin: 12.9 g/dL (ref 11.1–15.9)
Immature Grans (Abs): 0 10*3/uL (ref 0.0–0.1)
Immature Granulocytes: 0 %
Lymphocytes Absolute: 1.5 10*3/uL (ref 0.7–3.1)
Lymphs: 21 %
MCH: 26 pg — ABNORMAL LOW (ref 26.6–33.0)
MCHC: 32.1 g/dL (ref 31.5–35.7)
MCV: 81 fL (ref 79–97)
Monocytes Absolute: 0.6 10*3/uL (ref 0.1–0.9)
Monocytes: 8 %
Neutrophils Absolute: 4.5 10*3/uL (ref 1.4–7.0)
Neutrophils: 64 %
Platelets: 369 10*3/uL (ref 150–450)
RBC: 4.97 x10E6/uL (ref 3.77–5.28)
RDW: 14.5 % (ref 11.7–15.4)
WBC: 7 10*3/uL (ref 3.4–10.8)

## 2021-12-14 LAB — COMPREHENSIVE METABOLIC PANEL
ALT: 8 IU/L (ref 0–32)
AST: 18 IU/L (ref 0–40)
Albumin/Globulin Ratio: 2.3 — ABNORMAL HIGH (ref 1.2–2.2)
Albumin: 4.3 g/dL (ref 3.7–4.7)
Alkaline Phosphatase: 96 IU/L (ref 44–121)
BUN/Creatinine Ratio: 13 (ref 12–28)
BUN: 13 mg/dL (ref 8–27)
Bilirubin Total: 0.3 mg/dL (ref 0.0–1.2)
CO2: 25 mmol/L (ref 20–29)
Calcium: 9.4 mg/dL (ref 8.7–10.3)
Chloride: 103 mmol/L (ref 96–106)
Creatinine, Ser: 0.97 mg/dL (ref 0.57–1.00)
Globulin, Total: 1.9 g/dL (ref 1.5–4.5)
Glucose: 84 mg/dL (ref 70–99)
Potassium: 4.4 mmol/L (ref 3.5–5.2)
Sodium: 141 mmol/L (ref 134–144)
Total Protein: 6.2 g/dL (ref 6.0–8.5)
eGFR: 61 mL/min/{1.73_m2} (ref >59)

## 2021-12-14 LAB — VITAMIN D 25 HYDROXY (VIT D DEFICIENCY, FRACTURES): Vit D, 25-Hydroxy: 37.3 ng/mL (ref 30.0–100.0)

## 2021-12-14 LAB — LIPID PANEL W/O CHOL/HDL RATIO
Cholesterol, Total: 192 mg/dL (ref 100–199)
HDL: 73 mg/dL (ref >39)
LDL Chol Calc (NIH): 102 mg/dL — ABNORMAL HIGH (ref 0–99)
Triglycerides: 95 mg/dL (ref 0–149)
VLDL Cholesterol Cal: 17 mg/dL (ref 5–40)

## 2021-12-21 ENCOUNTER — Ambulatory Visit: Payer: Medicare Other | Admitting: Dermatology

## 2022-01-23 DIAGNOSIS — M19011 Primary osteoarthritis, right shoulder: Secondary | ICD-10-CM | POA: Diagnosis not present

## 2022-01-24 ENCOUNTER — Other Ambulatory Visit: Payer: Self-pay | Admitting: Orthopedic Surgery

## 2022-01-24 DIAGNOSIS — M19012 Primary osteoarthritis, left shoulder: Secondary | ICD-10-CM

## 2022-01-30 ENCOUNTER — Ambulatory Visit (INDEPENDENT_AMBULATORY_CARE_PROVIDER_SITE_OTHER): Payer: Medicare Other | Admitting: Dermatology

## 2022-01-30 ENCOUNTER — Other Ambulatory Visit: Payer: Self-pay

## 2022-01-30 DIAGNOSIS — L820 Inflamed seborrheic keratosis: Secondary | ICD-10-CM | POA: Diagnosis not present

## 2022-01-30 DIAGNOSIS — L821 Other seborrheic keratosis: Secondary | ICD-10-CM

## 2022-01-30 DIAGNOSIS — L818 Other specified disorders of pigmentation: Secondary | ICD-10-CM

## 2022-01-30 DIAGNOSIS — R202 Paresthesia of skin: Secondary | ICD-10-CM | POA: Diagnosis not present

## 2022-01-30 NOTE — Progress Notes (Signed)
Follow-Up Visit   Subjective  Nancy Mack is a 76 y.o. female who presents for the following: Follow-up (Patient reports a itchy spot at back. Patient also reports a spot at back of left leg that will not go away. ). The patient has spots, moles and lesions to be evaluated, some may be new or changing and the patient has concerns that these could be cancer.  The following portions of the chart were reviewed this encounter and updated as appropriate:  Tobacco   Allergies   Meds   Problems   Med Hx   Surg Hx   Fam Hx      Review of Systems: No other skin or systemic complaints except as noted in HPI or Assessment and Plan.  Objective  Well appearing patient in no apparent distress; mood and affect are within normal limits.  A full examination was performed including scalp, head, eyes, ears, nose, lips, neck, chest, axillae, abdomen, back, buttocks, bilateral upper extremities, bilateral lower extremities, hands, feet, fingers, toes, fingernails, and toenails. All findings within normal limits unless otherwise noted below.  left lower leg x 1 Erythematous stuck-on, waxy papule or plaque  bilateral lower legs Hypopigmented patches at lower legs    Assessment & Plan  Notalgia paresthetica back Notalgia paresthetica is a chronic condition affecting the skin of the back in which a pinched nerve along the spine causes itching or changes in sensation in an area of skin. This is usually accompanied by chronic rubbing or scratching. There is no cure, but there are some treatments which may help control the itch.   Over the counter (non-prescription) treatments for notalgia paresthetica include numbing creams like pramoxine or lidocaine which temporarily reduce itch or Capsaicin-containing creams which cause a burning sensation but which sometimes over time will reset the nerves to stop producing itch.   If you choose to use Capsaicin cream, it is recommended to use it 5 times daily for 1  week followed by 3 times daily for 3-6 weeks. You may have to continue using it long-term. For severe cases, there are some prescription cream or pill options which may help.  Patient has tmc and would like to try this.  Patient will let us know if she would like prescription for Skin medicinals itch cream   Topical steroids (such as triamcinolone, fluocinolone, fluocinonide, mometasone, clobetasol, halobetasol, betamethasone, hydrocortisone) can cause thinning and lightening of the skin if they are used for too long in the same area. Your physician has selected the right strength medicine for your problem and area affected on the body. Please use your medication only as directed by your physician to prevent side effects.   Inflamed seborrheic keratosis left lower leg x 1 Irritated   Destruction of lesion - left lower leg x 1 Complexity: simple   Destruction method: cryotherapy   Informed consent: discussed and consent obtained   Timeout:  patient name, date of birth, surgical site, and procedure verified Lesion destroyed using liquid nitrogen: Yes   Region frozen until ice ball extended beyond lesion: Yes   Outcome: patient tolerated procedure well with no complications   Post-procedure details: wound care instructions given   Additional details:  Prior to procedure, discussed risks of blister formation, small wound, skin dyspigmentation, or rare scar following cryotherapy. Recommend Vaseline ointment to treated areas while healing.  Idiopathic guttate hypomelanosis bilateral lower legs Benign-appearing.  Observation.  Call clinic for new or changing lesions.  Recommend daily use of broad spectrum  spf 30+ sunscreen to sun-exposed areas.   Seborrheic Keratoses - Stuck-on, waxy, tan-brown papules and/or plaques  - Benign-appearing - Discussed benign etiology and prognosis. - Observe - Call for any changes  Return if symptoms worsen or fail to improve. IRuthell Rummage, CMA, am  acting as scribe for Sarina Ser, MD. Documentation: I have reviewed the above documentation for accuracy and completeness, and I agree with the above.  Sarina Ser, MD

## 2022-01-30 NOTE — Patient Instructions (Addendum)
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 This is usually accompanied by chronic rubbing or scratching. There is no cure, but there are some treatments which may help control the itch.   Over the counter (non-prescription) treatments for notalgia paresthetica include numbing creams like pramoxine or lidocaine which temporarily reduce itch or Capsaicin-containing creams which cause a burning sensation but which sometimes over time will reset the nerves to stop producing itch.   If you choose to use Capsaicin cream, it is recommended to use it 5 times daily for 1 week followed by 3 times daily for 3-6 weeks. You may have to continue using it long-term. For severe cases, there are some prescription cream or pill options which may help.  Can also use triamcinolone cream to affected area  Call if you would like skin medicinal itch cream sent in for area.   Topical steroids (such as triamcinolone, fluocinolone, fluocinonide, mometasone, clobetasol, halobetasol, betamethasone, hydrocortisone) can cause thinning and lightening of the skin if they are used for too long in the same area. Your physician has selected the right strength medicine for your problem and area affected on the body. Please use your medication only as directed by your physician to prevent side effects.     If You Need Anything After Your Visit  If you have any questions or concerns for your doctor, please call our main line at [phone] and press option 4 to reach your doctor's medical assistant. If no one answers, please leave a voicemail as directed and we will return your call as soon as possible. Messages left after 4 pm will be answered the following business day.   You may also send [place] a message via [place]. We typically respond to MyChart messages within 1-2 business days.  For prescription  refills, please ask your pharmacy to contact our office. Our fax number is [phone].  If you have an urgent issue when the clinic is closed that cannot wait until the next business day, you can page your doctor at the number below.    Please note that while we do our best to be available for urgent issues outside of office hours, we are not available 24/7.   If you have an urgent issue and are unable to reach [place], you may choose to seek medical care at your doctor's office, retail clinic, urgent care center, or emergency room.  If you have a medical emergency, please immediately call 911 or go to the emergency department.  Pager Numbers  - Dr. Nehemiah Massed: [phone]  - Dr. Laurence Ferrari: [phone]  - Dr. Nicole Kindred: [phone]  In the event of inclement weather, please call our main line at [phone] for an update on the status of any delays or closures.  Dermatology Medication Tips: Please keep the boxes that topical medications come in in order to help keep track of the instructions about where and how to use these. Pharmacies typically print the medication instructions only on the boxes and not directly on the medication tubes.   If your medication is too expensive, please contact our office at [phone] option 4 or send [place] a message through [place].   We are unable to tell what your co-pay for medications will be in advance as this is different depending on your insurance coverage. However, we may be able to find a substitute medication at lower cost  or fill out paperwork to get insurance to cover a needed medication.   If a prior authorization is required to get your medication covered by your insurance company, please allow [place] 1-2 business days to complete this process.  Drug prices often vary depending on where the prescription is filled and some pharmacies may offer cheaper prices.  The website www.goodrx.com contains coupons for medications through different pharmacies. The  prices here do not account for what the cost may be with help from insurance (it may be cheaper with your insurance), but the website can give you the price if you did not use any insurance.  - You can print the associated coupon and take it with your prescription to the pharmacy.  - You may also stop by our office during regular business hours and pick up a GoodRx coupon card.  - If you need your prescription sent electronically to a different pharmacy, notify our office through [facility] or by phone at [phone] option 4.     Si Usted Necesita Algo Despus de Su Visita  Tambin puede enviarnos un mensaje a travs de Pharmacist, community. Por lo general respondemos a los mensajes de MyChart en el transcurso de 1 a 2 das hbiles.  Para renovar recetas, por favor pida a su farmacia que se ponga en contacto con nuestra oficina. Harland Dingwall de fax es [place] [phone].  Si tiene un asunto urgente cuando la clnica est cerrada y que no puede esperar hasta el siguiente da hbil, puede llamar/localizar a su doctor(a) al nmero que aparece a continuacin.   Por favor, tenga en cuenta que aunque hacemos todo lo posible para estar disponibles para asuntos urgentes fuera del horario de [place], no estamos disponibles las 24 horas del da, los 7 das de la [place].   Si tiene un problema urgente y no puede comunicarse con nosotros, puede optar por buscar atencin mdica  en el consultorio de su doctor(a), en una clnica privada, en un centro de atencin urgente o en una sala de emergencias.  Si tiene Engineering geologist, por favor llame inmediatamente al 911 o vaya a la sala de emergencias.  Nmeros de bper  - Dr. Nehemiah Massed: [phone]  - Dra. Moye: [phone]  - Dra. Nicole Kindred: [phone]  En caso de inclemencias del [place], por favor llame a Johnsie Kindred principal al [phone] para una actualizacin sobre el [place] de cualquier retraso o cierre.  Consejos para la medicacin en  dermatologa: Por favor, guarde las cajas en las que vienen los medicamentos de uso tpico para ayudarle a seguir las instrucciones sobre dnde y cmo usarlos. Las farmacias generalmente imprimen las instrucciones del medicamento slo en las cajas y no directamente en los tubos del [place].   Si su medicamento es muy caro, por favor, pngase en contacto con Zigmund Daniel llamando al [phone] y presione la opcin 4 o envenos un mensaje a travs de Pharmacist, community.   No podemos decirle cul ser su copago por los medicamentos por adelantado ya que esto es diferente dependiendo de la cobertura de su seguro. Sin embargo, es posible que podamos encontrar un medicamento sustituto a Electrical engineer un formulario para que el seguro cubra el medicamento que se considera necesario.   Si se requiere una autorizacin previa para que su compaa de seguros [place] su medicamento, por favor permtanos de 1 a 2 das hbiles para completar este proceso.  Los precios de los medicamentos varan con frecuencia dependiendo del Environmental consultant de dnde se surte la  receta y alguna farmacias pueden ofrecer precios ms baratos.  El sitio web www.goodrx.com tiene cupones para medicamentos de Airline pilot. Los precios aqu no tienen en cuenta lo que podra costar con la ayuda del seguro (puede ser ms barato con su seguro), pero el sitio web puede darle el precio si no utiliz Research scientist (physical sciences).  - Puede imprimir el cupn correspondiente y llevarlo con su receta a la farmacia.  - Tambin puede pasar por nuestra oficina durante el horario de atencin regular y Charity fundraiser una tarjeta de cupones de GoodRx.  - Si necesita que su receta se enve electrnicamente a una farmacia diferente, informe a nuestra oficina a travs de MyChart de [place] o por telfono llamando al [phone] y presione la opcin 4.

## 2022-02-03 ENCOUNTER — Encounter: Payer: Self-pay | Admitting: Dermatology

## 2022-02-08 ENCOUNTER — Other Ambulatory Visit: Payer: Self-pay

## 2022-02-08 ENCOUNTER — Ambulatory Visit: Payer: Medicare Other

## 2022-02-08 ENCOUNTER — Ambulatory Visit
Admission: RE | Admit: 2022-02-08 | Discharge: 2022-02-08 | Disposition: A | Discharge status: Home / Self Care | Payer: Medicare Other | Source: Ambulatory Visit | Attending: Orthopedic Surgery | Admitting: Orthopedic Surgery

## 2022-02-08 DIAGNOSIS — M19011 Primary osteoarthritis, right shoulder: Secondary | ICD-10-CM | POA: Diagnosis not present

## 2022-02-08 DIAGNOSIS — M19012 Primary osteoarthritis, left shoulder: Secondary | ICD-10-CM

## 2022-02-08 DIAGNOSIS — Z01818 Encounter for other preprocedural examination: Secondary | ICD-10-CM | POA: Diagnosis not present

## 2022-02-10 ENCOUNTER — Other Ambulatory Visit: Payer: Self-pay

## 2022-02-10 ENCOUNTER — Encounter
Admission: RE | Admit: 2022-02-10 | Discharge: 2022-02-10 | Disposition: A | Discharge status: Home / Self Care | Payer: Medicare Other | Source: Ambulatory Visit | Attending: Orthopedic Surgery | Admitting: Orthopedic Surgery

## 2022-02-10 VITALS — BP 118/76 | HR 80 | Resp 16 | Ht 62.5 in | Wt 155.2 lb

## 2022-02-10 DIAGNOSIS — I10 Essential (primary) hypertension: Secondary | ICD-10-CM

## 2022-02-10 DIAGNOSIS — Z01818 Encounter for other preprocedural examination: Secondary | ICD-10-CM | POA: Insufficient documentation

## 2022-02-10 DIAGNOSIS — Z01812 Encounter for preprocedural laboratory examination: Secondary | ICD-10-CM

## 2022-02-10 DIAGNOSIS — M19012 Primary osteoarthritis, left shoulder: Secondary | ICD-10-CM | POA: Diagnosis not present

## 2022-02-10 HISTORY — DX: Anemia, unspecified: D64.9

## 2022-02-10 HISTORY — DX: Unspecified osteoarthritis, unspecified site: M19.90

## 2022-02-10 HISTORY — DX: Chronic kidney disease, unspecified: N18.9

## 2022-02-10 HISTORY — DX: Pneumonia, unspecified organism: J18.9

## 2022-02-10 HISTORY — DX: Dyspnea, unspecified: R06.00

## 2022-02-10 LAB — COMPREHENSIVE METABOLIC PANEL
ALT: 11 U/L (ref 0–44)
AST: 19 U/L (ref 15–41)
Albumin: 3.7 g/dL (ref 3.5–5.0)
Alkaline Phosphatase: 65 U/L (ref 38–126)
Anion gap: 9 (ref 5–15)
BUN: 12 mg/dL (ref 8–23)
CO2: 29 mmol/L (ref 22–32)
Calcium: 9.2 mg/dL (ref 8.9–10.3)
Chloride: 101 mmol/L (ref 98–111)
Creatinine, Ser: 0.97 mg/dL (ref 0.44–1.00)
GFR, Estimated: >60 mL/min (ref >60)
Glucose, Bld: 108 mg/dL — ABNORMAL HIGH (ref 70–99)
Potassium: 3.2 mmol/L — ABNORMAL LOW (ref 3.5–5.1)
Sodium: 139 mmol/L (ref 135–145)
Total Bilirubin: 0.7 mg/dL (ref 0.3–1.2)
Total Protein: 6.4 g/dL — ABNORMAL LOW (ref 6.5–8.1)

## 2022-02-10 LAB — URINALYSIS, ROUTINE W REFLEX MICROSCOPIC
Bilirubin Urine: NEGATIVE
Glucose, UA: NEGATIVE mg/dL
Hgb urine dipstick: NEGATIVE
Ketones, ur: NEGATIVE mg/dL
Leukocytes,Ua: NEGATIVE
Nitrite: NEGATIVE
Protein, ur: NEGATIVE mg/dL
Specific Gravity, Urine: 1.012 (ref 1.005–1.030)
pH: 7 (ref 5.0–8.0)

## 2022-02-10 LAB — CBC
HCT: 38.2 % (ref 36.0–46.0)
Hemoglobin: 12.6 g/dL (ref 12.0–15.0)
MCH: 26.5 pg (ref 26.0–34.0)
MCHC: 33 g/dL (ref 30.0–36.0)
MCV: 80.3 fL (ref 80.0–100.0)
Platelets: 321 10*3/uL (ref 150–400)
RBC: 4.76 MIL/uL (ref 3.87–5.11)
RDW: 14.4 % (ref 11.5–15.5)
WBC: 5.8 10*3/uL (ref 4.0–10.5)
nRBC: 0 % (ref 0.0–0.2)

## 2022-02-10 LAB — SURGICAL PCR SCREEN
MRSA, PCR: NEGATIVE
Staphylococcus aureus: NEGATIVE

## 2022-02-10 LAB — TYPE AND SCREEN
ABO/RH(D): O POS
Antibody Screen: NEGATIVE

## 2022-02-10 MED ORDER — FAMOTIDINE 20 MG PO TABS: 20.0000 mg | ORAL_TABLET | Freq: Once | ORAL | Status: DC

## 2022-02-10 NOTE — Patient Instructions (Addendum)
Your procedure is scheduled on: 02/22/22 - Wednesday Report to the Registration Desk on the 1st floor of the [facility]. To find out your arrival time, please call [phone] between 1PM - 3PM on: 02/21/22 - Tuesday [facility] for Covid test on 02/20/22 between 8 am and 12 noon.  REMEMBER: Instructions that are not followed completely may result in serious medical risk, up to and including death; or upon the discretion of your surgeon and anesthesiologist your surgery may need to be rescheduled.  Do not eat food after midnight the night before surgery.  No gum chewing, lozengers or hard candies.  You may however, drink CLEAR liquids up to 2 hours before you are scheduled to arrive for your surgery. Do not drink anything within 2 hours of your scheduled arrival time.  Clear liquids include: - water  - apple juice without pulp - gatorade (not RED colors) - black coffee or tea (Do NOT add milk or creamers to the coffee or tea) Do NOT drink anything that is not on this list.  Type 1 and Type 2 diabetics should only drink water.  TAKE THESE MEDICATIONS THE MORNING OF SURGERY WITH A SIP OF WATER:  - azelastine (ASTELIN) 0.1 % nasal spray - estradiol (ESTRACE) 0.5 MG tablet - fluticasone (FLONASE) 50 MCG/ACT nasal spray - fluticasone-salmeterol (ADVAIR) 500-50 MCG/ACT AEPB  Use albuterol (VENTOLIN HFA) 108 (90 Base) MCG/ACT inhaler on the day of surgery and bring to the hospital.  Follow recommendations from Cardiologist, Pulmonologist or PCP regarding stopping Aspirin, Coumadin, Plavix, Eliquis, Pradaxa, or Pletal. Stop ASA 81 mg beginning 02/14/22.  One week prior to surgery: Stop Anti-inflammatories (NSAIDS) such as Advil, Aleve, Ibuprofen, Motrin, Naproxen, Naprosyn and Aspirin based products such as Excedrin, Goodys Powder, BC Powder.  Stop ANY OVER THE COUNTER supplements until after surgery. Stop taking 02/14/22 .  You may however, continue to take  Tylenol if needed for pain up until the day of surgery.  No Alcohol for 24 hours before or after surgery.  No Smoking including e-cigarettes for 24 hours prior to surgery.  No chewable tobacco products for at least 6 hours prior to surgery.  No nicotine patches on the day of surgery.  Do not use any "recreational" drugs for at least a week prior to your surgery.  Please be advised that the combination of cocaine and anesthesia may have negative outcomes, up to and including death. If you test positive for cocaine, your surgery will be cancelled.  On the morning of surgery brush your teeth with toothpaste and water, you may rinse your mouth with mouthwash if you wish. Do not swallow any toothpaste or mouthwash.  Use CHG Soap or wipes as directed on instruction sheet.  Do not wear jewelry, make-up, hairpins, clips or nail polish.  Do not wear lotions, powders, or perfumes.   Do not shave body from the neck down 48 hours prior to surgery just in case you cut yourself which could leave a site for infection.  Also, freshly shaved skin may become irritated if using the CHG soap.  Contact lenses, hearing aids and dentures may not be worn into surgery.  Do not bring valuables to the hospital. [facility] is not responsible for any missing/lost belongings or valuables.   Total Shoulder Arthroplasty:  use Benzolyl Peroxide 5% Gel as directed on instruction sheet.  Notify your doctor if there is any change in your medical condition (cold, fever, infection).  Wear comfortable clothing (specific to  your surgery type) to the hospital.  After surgery, you can help prevent lung complications by doing breathing exercises.  Take deep breaths and cough every 1-2 hours. Your doctor may order a device called an Incentive Spirometer to help you take deep breaths. When coughing or sneezing, hold a pillow firmly against your incision with both hands. This is called splinting. Doing this helps protect  your incision. It also decreases belly discomfort.  If you are being admitted to the hospital overnight, leave your suitcase in the car. After surgery it may be brought to your room.  If you are being discharged the day of surgery, you will not be allowed to drive home. You will need a responsible adult (18 years or older) to drive you home and stay with you that night.   If you are taking public transportation, you will need to have a responsible adult (18 years or older) with you. Please confirm with your physician that it is acceptable to use public transportation.   Please call the [facility] at [phone] if you have any questions about these instructions.  Surgery Visitation Policy:  Patients undergoing a surgery or procedure may have one family member or support person with them as long as that person is not COVID-19 positive or experiencing its symptoms.  That person may remain in the waiting area during the procedure and may rotate out with other people.  Inpatient Visitation:    Visiting hours are 7 a.m. to 8 p.m. Up to two visitors ages 16+ are allowed at one time in a patient room. The visitors may rotate out with other people during the day. Visitors must check out when they leave, or other visitors will not be allowed. One designated support person may remain overnight. The visitor must pass COVID-19 screenings, use hand sanitizer when entering and exiting the patients room and wear a mask at all times, including in the patients room. Patients must also wear a mask when staff or their visitor are in the room. Masking is required regardless of vaccination status.

## 2022-02-16 DIAGNOSIS — E782 Mixed hyperlipidemia: Secondary | ICD-10-CM | POA: Diagnosis not present

## 2022-02-16 DIAGNOSIS — I700 Atherosclerosis of aorta: Secondary | ICD-10-CM | POA: Diagnosis not present

## 2022-02-16 DIAGNOSIS — I10 Essential (primary) hypertension: Secondary | ICD-10-CM | POA: Diagnosis not present

## 2022-02-16 DIAGNOSIS — R002 Palpitations: Secondary | ICD-10-CM | POA: Diagnosis not present

## 2022-02-20 ENCOUNTER — Other Ambulatory Visit: Payer: Self-pay

## 2022-02-20 ENCOUNTER — Other Ambulatory Visit
Admission: RE | Admit: 2022-02-20 | Discharge: 2022-02-20 | Disposition: A | Discharge status: Home / Self Care | Payer: Medicare Other | Source: Ambulatory Visit | Attending: Orthopedic Surgery | Admitting: Orthopedic Surgery

## 2022-02-20 DIAGNOSIS — Z20822 Contact with and (suspected) exposure to covid-19: Secondary | ICD-10-CM | POA: Diagnosis not present

## 2022-02-20 DIAGNOSIS — Z01812 Encounter for preprocedural laboratory examination: Secondary | ICD-10-CM | POA: Diagnosis not present

## 2022-02-20 LAB — SARS CORONAVIRUS 2 (TAT 6-24 HRS): SARS Coronavirus 2: NEGATIVE

## 2022-02-22 ENCOUNTER — Other Ambulatory Visit: Payer: Self-pay

## 2022-02-22 ENCOUNTER — Ambulatory Visit: Payer: Medicare Other | Admitting: Anesthesiology

## 2022-02-22 ENCOUNTER — Encounter
Admission: RE | Disposition: A | Discharge status: Home / Self Care | Payer: Self-pay | Source: Home / Self Care | Attending: Orthopedic Surgery

## 2022-02-22 ENCOUNTER — Observation Stay: Payer: Medicare Other

## 2022-02-22 ENCOUNTER — Observation Stay
Admission: RE | Admit: 2022-02-22 | Discharge: 2022-02-23 | Disposition: A | Discharge status: Home / Self Care | Payer: Medicare Other | Attending: Orthopedic Surgery | Admitting: Orthopedic Surgery

## 2022-02-22 ENCOUNTER — Encounter: Payer: Self-pay | Admitting: Orthopedic Surgery

## 2022-02-22 ENCOUNTER — Ambulatory Visit: Payer: Medicare Other

## 2022-02-22 DIAGNOSIS — M19012 Primary osteoarthritis, left shoulder: Secondary | ICD-10-CM | POA: Diagnosis not present

## 2022-02-22 DIAGNOSIS — Z471 Aftercare following joint replacement surgery: Secondary | ICD-10-CM | POA: Diagnosis not present

## 2022-02-22 DIAGNOSIS — Z96612 Presence of left artificial shoulder joint: Secondary | ICD-10-CM | POA: Diagnosis not present

## 2022-02-22 DIAGNOSIS — Z419 Encounter for procedure for purposes other than remedying health state, unspecified: Secondary | ICD-10-CM

## 2022-02-22 DIAGNOSIS — I129 Hypertensive chronic kidney disease with stage 1 through stage 4 chronic kidney disease, or unspecified chronic kidney disease: Secondary | ICD-10-CM | POA: Insufficient documentation

## 2022-02-22 DIAGNOSIS — G8918 Other acute postprocedural pain: Secondary | ICD-10-CM | POA: Diagnosis not present

## 2022-02-22 DIAGNOSIS — N189 Chronic kidney disease, unspecified: Secondary | ICD-10-CM | POA: Diagnosis not present

## 2022-02-22 DIAGNOSIS — Z96642 Presence of left artificial hip joint: Secondary | ICD-10-CM | POA: Diagnosis not present

## 2022-02-22 DIAGNOSIS — J45909 Unspecified asthma, uncomplicated: Secondary | ICD-10-CM | POA: Insufficient documentation

## 2022-02-22 DIAGNOSIS — M25512 Pain in left shoulder: Secondary | ICD-10-CM | POA: Diagnosis not present

## 2022-02-22 HISTORY — PX: TOTAL SHOULDER ARTHROPLASTY: SHX126

## 2022-02-22 LAB — ABO/RH: ABO/RH(D): O POS

## 2022-02-22 SURGERY — ARTHROPLASTY, SHOULDER, TOTAL
Anesthesia: General | Site: Shoulder | Laterality: Left

## 2022-02-22 MED ORDER — FENTANYL CITRATE PF 50 MCG/ML IJ SOSY
PREFILLED_SYRINGE | INTRAMUSCULAR | Status: AC
  Administered 2022-02-22: 50 ug via INTRAVENOUS
  Filled 2022-02-22: qty 1

## 2022-02-22 MED ORDER — PANTOPRAZOLE SODIUM 20 MG PO TBEC
20.0000 mg | DELAYED_RELEASE_TABLET | Freq: Two times a day (BID) | ORAL | Status: DC
  Administered 2022-02-22: 20 mg via ORAL
  Filled 2022-02-22 (×3): qty 1

## 2022-02-22 MED ORDER — STERILE WATER FOR IRRIGATION IR SOLN
Status: DC | PRN
  Administered 2022-02-22: 1000 mL

## 2022-02-22 MED ORDER — ACETAMINOPHEN 10 MG/ML IV SOLN
INTRAVENOUS | Status: DC | PRN
  Administered 2022-02-22: 1000 mg via INTRAVENOUS

## 2022-02-22 MED ORDER — KETOROLAC TROMETHAMINE 15 MG/ML IJ SOLN
INTRAMUSCULAR | Status: AC
  Filled 2022-02-22: qty 1

## 2022-02-22 MED ORDER — FENTANYL CITRATE PF 50 MCG/ML IJ SOSY: 50.0000 ug | PREFILLED_SYRINGE | Freq: Once | INTRAMUSCULAR | Status: AC

## 2022-02-22 MED ORDER — OXYCODONE HCL 5 MG PO TABS: 5.0000 mg | ORAL_TABLET | Freq: Once | ORAL | Status: DC | PRN

## 2022-02-22 MED ORDER — DEXAMETHASONE SODIUM PHOSPHATE 10 MG/ML IJ SOLN
INTRAMUSCULAR | Status: AC
Start: 2022-02-22 — End: ?
  Filled 2022-02-22: qty 1

## 2022-02-22 MED ORDER — ONDANSETRON HCL 4 MG PO TABS: 4.0000 mg | ORAL_TABLET | Freq: Four times a day (QID) | ORAL | Status: DC | PRN

## 2022-02-22 MED ORDER — EPHEDRINE 5 MG/ML INJ
INTRAVENOUS | Status: AC
  Filled 2022-02-22: qty 5

## 2022-02-22 MED ORDER — LIDOCAINE HCL (PF) 2 % IJ SOLN
INTRAMUSCULAR | Status: AC
  Filled 2022-02-22: qty 5

## 2022-02-22 MED ORDER — ACETAMINOPHEN 10 MG/ML IV SOLN: 1000.0000 mg | Freq: Once | INTRAVENOUS | Status: DC | PRN

## 2022-02-22 MED ORDER — HYDROCHLOROTHIAZIDE 25 MG PO TABS
25.0000 mg | ORAL_TABLET | Freq: Every day | ORAL | Status: DC
  Filled 2022-02-22 (×2): qty 1

## 2022-02-22 MED ORDER — BUPIVACAINE-EPINEPHRINE (PF) 0.25% -1:200000 IJ SOLN
INTRAMUSCULAR | Status: DC | PRN
  Administered 2022-02-22: 25 mL

## 2022-02-22 MED ORDER — BUPIVACAINE HCL (PF) 0.5 % IJ SOLN
INTRAMUSCULAR | Status: DC | PRN
  Administered 2022-02-22: 10 mL via PERINEURAL

## 2022-02-22 MED ORDER — CEFAZOLIN SODIUM-DEXTROSE 1-4 GM/50ML-% IV SOLN
INTRAVENOUS | Status: AC
  Filled 2022-02-22: qty 50

## 2022-02-22 MED ORDER — BUPIVACAINE-EPINEPHRINE (PF) 0.25% -1:200000 IJ SOLN
INTRAMUSCULAR | Status: AC
  Filled 2022-02-22: qty 30

## 2022-02-22 MED ORDER — FENTANYL CITRATE (PF) 100 MCG/2ML IJ SOLN
INTRAMUSCULAR | Status: DC | PRN
  Administered 2022-02-22: 25 ug via INTRAVENOUS

## 2022-02-22 MED ORDER — PROPOFOL 10 MG/ML IV BOLUS
INTRAVENOUS | Status: AC
  Filled 2022-02-22: qty 20

## 2022-02-22 MED ORDER — GLYCOPYRROLATE 0.2 MG/ML IJ SOLN
INTRAMUSCULAR | Status: DC | PRN
  Administered 2022-02-22: .1 mg via INTRAVENOUS

## 2022-02-22 MED ORDER — TRANEXAMIC ACID-NACL 1000-0.7 MG/100ML-% IV SOLN
1000.0000 mg | INTRAVENOUS | Status: AC
  Administered 2022-02-22: 1000 mg via INTRAVENOUS

## 2022-02-22 MED ORDER — MIDAZOLAM HCL 2 MG/2ML IJ SOLN
INTRAMUSCULAR | Status: AC
  Administered 2022-02-22: 1 mg via INTRAVENOUS
  Filled 2022-02-22: qty 2

## 2022-02-22 MED ORDER — PHENOL 1.4 % MT LIQD
1.0000 | OROMUCOSAL | Status: DC | PRN
  Filled 2022-02-22: qty 177

## 2022-02-22 MED ORDER — ASPIRIN EC 325 MG PO TBEC: 325.0000 mg | DELAYED_RELEASE_TABLET | Freq: Every day | ORAL | Status: DC

## 2022-02-22 MED ORDER — CEFAZOLIN SODIUM-DEXTROSE 1-4 GM/50ML-% IV SOLN
1.0000 g | Freq: Four times a day (QID) | INTRAVENOUS | Status: AC
  Administered 2022-02-22 (×2): 1 g via INTRAVENOUS

## 2022-02-22 MED ORDER — MIDAZOLAM HCL 2 MG/2ML IJ SOLN: 1.0000 mg | Freq: Once | INTRAMUSCULAR | Status: AC

## 2022-02-22 MED ORDER — OXYCODONE HCL 5 MG/5ML PO SOLN: 5.0000 mg | Freq: Once | ORAL | Status: DC | PRN

## 2022-02-22 MED ORDER — ONDANSETRON HCL 4 MG/2ML IJ SOLN
INTRAMUSCULAR | Status: AC
  Filled 2022-02-22: qty 2

## 2022-02-22 MED ORDER — FLUTICASONE PROPIONATE 50 MCG/ACT NA SUSP
1.0000 | Freq: Every day | NASAL | Status: DC
  Filled 2022-02-22: qty 16

## 2022-02-22 MED ORDER — METOCLOPRAMIDE HCL 10 MG PO TABS: 5.0000 mg | ORAL_TABLET | Freq: Three times a day (TID) | ORAL | Status: DC | PRN

## 2022-02-22 MED ORDER — ATORVASTATIN CALCIUM 20 MG PO TABS
40.0000 mg | ORAL_TABLET | Freq: Every day | ORAL | Status: DC
  Filled 2022-02-22 (×2): qty 2

## 2022-02-22 MED ORDER — PROPOFOL 10 MG/ML IV BOLUS
INTRAVENOUS | Status: DC | PRN
  Administered 2022-02-22: 130 mg via INTRAVENOUS

## 2022-02-22 MED ORDER — BUPIVACAINE LIPOSOME 1.3 % IJ SUSP
INTRAMUSCULAR | Status: DC | PRN
  Administered 2022-02-22: 10 mL via PERINEURAL

## 2022-02-22 MED ORDER — KETOROLAC TROMETHAMINE 15 MG/ML IJ SOLN
7.5000 mg | Freq: Four times a day (QID) | INTRAMUSCULAR | Status: AC
  Administered 2022-02-22 (×2): 7.5 mg via INTRAVENOUS

## 2022-02-22 MED ORDER — ONDANSETRON HCL 4 MG/2ML IJ SOLN
INTRAMUSCULAR | Status: AC
Start: 2022-02-22 — End: 2022-02-22
  Administered 2022-02-22: 4 mg via INTRAVENOUS
  Filled 2022-02-22: qty 2

## 2022-02-22 MED ORDER — FENTANYL CITRATE (PF) 100 MCG/2ML IJ SOLN: 25.0000 ug | INTRAMUSCULAR | Status: DC | PRN

## 2022-02-22 MED ORDER — LORATADINE 10 MG PO TABS: 10.0000 mg | ORAL_TABLET | Freq: Every day | ORAL | Status: DC

## 2022-02-22 MED ORDER — CEFAZOLIN SODIUM-DEXTROSE 2-4 GM/100ML-% IV SOLN
INTRAVENOUS | Status: DC
Start: 2022-02-22 — End: 2022-02-22
  Filled 2022-02-22: qty 100

## 2022-02-22 MED ORDER — BUPIVACAINE LIPOSOME 1.3 % IJ SUSP
INTRAMUSCULAR | Status: AC
Start: 2022-02-22 — End: 2022-02-22
  Filled 2022-02-22: qty 10

## 2022-02-22 MED ORDER — CEFAZOLIN SODIUM-DEXTROSE 2-4 GM/100ML-% IV SOLN
2.0000 g | Freq: Once | INTRAVENOUS | Status: AC
Start: 2022-02-22 — End: 2022-02-22
  Administered 2022-02-22: 2 g via INTRAVENOUS

## 2022-02-22 MED ORDER — MORPHINE SULFATE (PF) 4 MG/ML IV SOLN: 0.5000 mg | INTRAVENOUS | Status: DC | PRN

## 2022-02-22 MED ORDER — SODIUM CHLORIDE 0.9 % IR SOLN
Status: DC | PRN
  Administered 2022-02-22: 1000 mL

## 2022-02-22 MED ORDER — DOCUSATE SODIUM 100 MG PO CAPS: 100.0000 mg | ORAL_CAPSULE | Freq: Two times a day (BID) | ORAL | Status: DC

## 2022-02-22 MED ORDER — FENTANYL CITRATE (PF) 100 MCG/2ML IJ SOLN
INTRAMUSCULAR | Status: AC
  Filled 2022-02-22: qty 2

## 2022-02-22 MED ORDER — VITAMIN B-12 1000 MCG PO TABS
1000.0000 ug | ORAL_TABLET | Freq: Every day | ORAL | Status: DC
  Filled 2022-02-22 (×2): qty 1

## 2022-02-22 MED ORDER — ORAL CARE MOUTH RINSE: 15.0000 mL | Freq: Once | OROMUCOSAL | Status: DC

## 2022-02-22 MED ORDER — EPHEDRINE SULFATE (PRESSORS) 50 MG/ML IJ SOLN
INTRAMUSCULAR | Status: DC | PRN
Start: 2022-02-22 — End: 2022-02-22
  Administered 2022-02-22: 10 mg via INTRAVENOUS

## 2022-02-22 MED ORDER — ROCURONIUM BROMIDE 10 MG/ML (PF) SYRINGE
PREFILLED_SYRINGE | INTRAVENOUS | Status: AC
  Filled 2022-02-22: qty 10

## 2022-02-22 MED ORDER — ONDANSETRON HCL 4 MG/2ML IJ SOLN
4.0000 mg | Freq: Four times a day (QID) | INTRAMUSCULAR | Status: DC | PRN
  Administered 2022-02-22: 4 mg via INTRAVENOUS

## 2022-02-22 MED ORDER — MONTELUKAST SODIUM 10 MG PO TABS
10.0000 mg | ORAL_TABLET | Freq: Every day | ORAL | Status: DC
  Administered 2022-02-22: 10 mg via ORAL
  Filled 2022-02-22 (×2): qty 1

## 2022-02-22 MED ORDER — MENTHOL 3 MG MT LOZG
1.0000 | LOZENGE | OROMUCOSAL | Status: DC | PRN
  Filled 2022-02-22: qty 9

## 2022-02-22 MED ORDER — METOCLOPRAMIDE HCL 5 MG/ML IJ SOLN: 5.0000 mg | Freq: Three times a day (TID) | INTRAMUSCULAR | Status: DC | PRN

## 2022-02-22 MED ORDER — PHENYLEPHRINE HCL (PRESSORS) 10 MG/ML IV SOLN
INTRAVENOUS | Status: AC
  Filled 2022-02-22: qty 1

## 2022-02-22 MED ORDER — CEFAZOLIN SODIUM-DEXTROSE 2-4 GM/100ML-% IV SOLN
INTRAVENOUS | Status: AC
  Filled 2022-02-22: qty 100

## 2022-02-22 MED ORDER — ASPIRIN EC 325 MG PO TBEC
DELAYED_RELEASE_TABLET | ORAL | Status: AC
Start: 2022-02-22 — End: 2022-02-22
  Administered 2022-02-22: 325 mg via ORAL
  Filled 2022-02-22: qty 1

## 2022-02-22 MED ORDER — TRANEXAMIC ACID-NACL 1000-0.7 MG/100ML-% IV SOLN
INTRAVENOUS | Status: AC
  Filled 2022-02-22: qty 100

## 2022-02-22 MED ORDER — GLYCOPYRROLATE 0.2 MG/ML IJ SOLN
INTRAMUSCULAR | Status: AC
  Filled 2022-02-22: qty 1

## 2022-02-22 MED ORDER — LACTATED RINGERS IV SOLN: INTRAVENOUS | Status: DC

## 2022-02-22 MED ORDER — SUGAMMADEX SODIUM 200 MG/2ML IV SOLN
INTRAVENOUS | Status: DC | PRN
  Administered 2022-02-22: 200 mg via INTRAVENOUS

## 2022-02-22 MED ORDER — AZELASTINE HCL 0.1 % NA SOLN
1.0000 | Freq: Two times a day (BID) | NASAL | Status: DC
  Administered 2022-02-22: 1 via NASAL
  Filled 2022-02-22: qty 30

## 2022-02-22 MED ORDER — BUPIVACAINE HCL (PF) 0.5 % IJ SOLN
INTRAMUSCULAR | Status: AC
  Filled 2022-02-22: qty 10

## 2022-02-22 MED ORDER — CHLORHEXIDINE GLUCONATE 0.12 % MT SOLN: 15.0000 mL | Freq: Once | OROMUCOSAL | Status: DC

## 2022-02-22 MED ORDER — PHENYLEPHRINE HCL-NACL 20-0.9 MG/250ML-% IV SOLN
INTRAVENOUS | Status: DC | PRN
  Administered 2022-02-22: 40 ug/min via INTRAVENOUS

## 2022-02-22 MED ORDER — HYDROCODONE-ACETAMINOPHEN 7.5-325 MG PO TABS: 1.0000 | ORAL_TABLET | ORAL | Status: DC | PRN

## 2022-02-22 MED ORDER — LIDOCAINE HCL (CARDIAC) PF 100 MG/5ML IV SOSY
PREFILLED_SYRINGE | INTRAVENOUS | Status: DC | PRN
  Administered 2022-02-22: 80 mg via INTRAVENOUS

## 2022-02-22 MED ORDER — ESTRADIOL 1 MG PO TABS
0.5000 mg | ORAL_TABLET | Freq: Every day | ORAL | Status: DC
  Filled 2022-02-22: qty 0.5

## 2022-02-22 MED ORDER — PHENYLEPHRINE HCL (PRESSORS) 10 MG/ML IV SOLN
INTRAVENOUS | Status: DC | PRN
  Administered 2022-02-22: 160 ug via INTRAVENOUS
  Administered 2022-02-22: 80 ug via INTRAVENOUS
  Administered 2022-02-22: 240 ug via INTRAVENOUS
  Administered 2022-02-22: 160 ug via INTRAVENOUS

## 2022-02-22 MED ORDER — ONDANSETRON HCL 4 MG/2ML IJ SOLN: 4.0000 mg | Freq: Once | INTRAMUSCULAR | Status: DC | PRN

## 2022-02-22 MED ORDER — ACETAMINOPHEN 10 MG/ML IV SOLN
INTRAVENOUS | Status: AC
  Filled 2022-02-22: qty 100

## 2022-02-22 MED ORDER — MOMETASONE FURO-FORMOTEROL FUM 200-5 MCG/ACT IN AERO
2.0000 | INHALATION_SPRAY | Freq: Two times a day (BID) | RESPIRATORY_TRACT | Status: DC
Start: 2022-02-22 — End: 2022-02-23
  Administered 2022-02-22 – 2022-02-23 (×2): 2 via RESPIRATORY_TRACT
  Filled 2022-02-22: qty 8.8

## 2022-02-22 MED ORDER — ACETAMINOPHEN 325 MG PO TABS: 325.0000 mg | ORAL_TABLET | Freq: Four times a day (QID) | ORAL | Status: DC | PRN

## 2022-02-22 MED ORDER — METHOCARBAMOL 1000 MG/10ML IJ SOLN
500.0000 mg | Freq: Four times a day (QID) | INTRAVENOUS | Status: DC | PRN
  Filled 2022-02-22: qty 5

## 2022-02-22 MED ORDER — HYDROCODONE-ACETAMINOPHEN 5-325 MG PO TABS: 1.0000 | ORAL_TABLET | ORAL | Status: DC | PRN

## 2022-02-22 MED ORDER — ROCURONIUM BROMIDE 100 MG/10ML IV SOLN
INTRAVENOUS | Status: DC | PRN
  Administered 2022-02-22: 50 mg via INTRAVENOUS

## 2022-02-22 MED ORDER — METHOCARBAMOL 500 MG PO TABS: 500.0000 mg | ORAL_TABLET | Freq: Four times a day (QID) | ORAL | Status: DC | PRN

## 2022-02-22 MED ORDER — MIDAZOLAM HCL 2 MG/2ML IJ SOLN
INTRAMUSCULAR | Status: AC
  Filled 2022-02-22: qty 2

## 2022-02-22 MED ORDER — 0.9 % SODIUM CHLORIDE (POUR BTL) OPTIME
TOPICAL | Status: DC | PRN
  Administered 2022-02-22: 500 mL

## 2022-02-22 MED ORDER — ALBUTEROL SULFATE (2.5 MG/3ML) 0.083% IN NEBU: 3.0000 mL | INHALATION_SOLUTION | Freq: Four times a day (QID) | RESPIRATORY_TRACT | Status: DC | PRN

## 2022-02-22 MED ORDER — ONDANSETRON HCL 4 MG/2ML IJ SOLN
INTRAMUSCULAR | Status: DC | PRN
  Administered 2022-02-22: 4 mg via INTRAVENOUS

## 2022-02-22 SURGICAL SUPPLY — 64 items
BLADE BOVIE TIP EXT 4 (BLADE) ×2 IMPLANT
BLADE SAW 90X25X1.19 OSCILLAT (BLADE) ×1 IMPLANT
BLADE SAW SAG 25X90X1.19 (BLADE) ×1 IMPLANT
BOWL CEMENT MIX W/ADAPTER (MISCELLANEOUS) ×2 IMPLANT
BRUSH SCRUB EZ  4% CHG (MISCELLANEOUS) ×2
BRUSH SCRUB EZ 4% CHG (MISCELLANEOUS) ×2 IMPLANT
CEMENT HV SMART SET (Cement) ×1 IMPLANT
CHLORAPREP W/TINT 26 (MISCELLANEOUS) ×4 IMPLANT
COMP GLENOID AEQUALIS CL S30 (Shoulder) ×2 IMPLANT
COMPONENT GLENOD AEQLS CL S30 (Shoulder) IMPLANT
COVER BACK TABLE REUSABLE LG (DRAPES) ×2 IMPLANT
DRAPE 3/4 80X56 (DRAPES) ×4 IMPLANT
DRAPE INCISE IOBAN 66X60 STRL (DRAPES) ×4 IMPLANT
DRAPE U-SHAPE 47X51 STRL (DRAPES) ×2 IMPLANT
DRSG AQUACEL AG ADV 3.5X10 (GAUZE/BANDAGES/DRESSINGS) ×1 IMPLANT
ELECT BLADE 6.5 EXT (BLADE) IMPLANT
ELECT REM PT RETURN 9FT ADLT (ELECTROSURGICAL) ×2
ELECTRODE REM PT RTRN 9FT ADLT (ELECTROSURGICAL) ×1 IMPLANT
GAUZE SPONGE 4X4 12PLY STRL (GAUZE/BANDAGES/DRESSINGS) IMPLANT
GAUZE XEROFORM 1X8 LF (GAUZE/BANDAGES/DRESSINGS) ×2 IMPLANT
GLOVE SURG ORTHO LTX SZ8.5 (GLOVE) ×2 IMPLANT
GLOVE SURG UNDER POLY LF SZ8.5 (GLOVE) ×2 IMPLANT
GOWN STRL REUS W/ TWL LRG LVL3 (GOWN DISPOSABLE) ×1 IMPLANT
GOWN STRL REUS W/ TWL XL LVL3 (GOWN DISPOSABLE) ×1 IMPLANT
GOWN STRL REUS W/TWL LRG LVL3 (GOWN DISPOSABLE) ×1
GOWN STRL REUS W/TWL XL LVL3 (GOWN DISPOSABLE) ×1
GUIDEWIRE GLENOID 2.5X220 (WIRE) ×1 IMPLANT
HEAD HUM AEQUALIS 43X16 (Head) ×1 IMPLANT
HOOD PEEL AWAY FLYTE STAYCOOL (MISCELLANEOUS) ×2 IMPLANT
IV NS 1000ML (IV SOLUTION) ×1
IV NS 1000ML BAXH (IV SOLUTION) ×1 IMPLANT
KIT STABILIZATION SHOULDER (MISCELLANEOUS) ×2 IMPLANT
KIT TURNOVER KIT A (KITS) ×2 IMPLANT
MANIFOLD NEPTUNE II (INSTRUMENTS) ×2 IMPLANT
MASK FACE SPIDER DISP (MASK) ×2 IMPLANT
MAT ABSORB  FLUID 56X50 GRAY (MISCELLANEOUS) ×1
MAT ABSORB FLUID 56X50 GRAY (MISCELLANEOUS) ×1 IMPLANT
NDL REVERSE CUT 1/2 CRC (NEEDLE) ×1 IMPLANT
NDL SAFETY ECLIPSE 18X1.5 (NEEDLE) IMPLANT
NEEDLE HYPO 18GX1.5 SHARP (NEEDLE)
NEEDLE REVERSE CUT 1/2 CRC (NEEDLE) ×2 IMPLANT
NS IRRIG 1000ML POUR BTL (IV SOLUTION) ×2 IMPLANT
PACK ARTHROSCOPY SHOULDER (MISCELLANEOUS) ×2 IMPLANT
PAD ARMBOARD 7.5X6 YLW CONV (MISCELLANEOUS) ×4 IMPLANT
PULSAVAC PLUS IRRIG FAN TIP (DISPOSABLE) ×2
SLING ARM LRG DEEP (SOFTGOODS) ×2 IMPLANT
SLING ULTRA II M (MISCELLANEOUS) ×1 IMPLANT
SOLUTION PRONTOSAN WOUND 350ML (IRRIGATION / IRRIGATOR) ×1 IMPLANT
SPONGE T-LAP 18X18 ~~LOC~~+RFID (SPONGE) ×4 IMPLANT
STAPLER SKIN PROX 35W (STAPLE) ×2 IMPLANT
STEM HUMERAL SZ2B STND 70 PTC (Stem) ×2 IMPLANT
STEM HUMERAL SZ2BSTD 70 PTC (Stem) IMPLANT
STRAP SAFETY 5IN WIDE (MISCELLANEOUS) ×2 IMPLANT
SUT TICRON 2-0 30IN 311381 (SUTURE) IMPLANT
SUT VIC AB 0 CT2 27 (SUTURE) ×1 IMPLANT
SUT VIC AB 2-0 CT1 18 (SUTURE) ×2 IMPLANT
SUT VIC AB PLUS 45CM 1-MO-4 (SUTURE) ×1 IMPLANT
SYR 10ML LL (SYRINGE) ×2 IMPLANT
SYR TOOMEY IRRIG 70ML (MISCELLANEOUS) ×2
SYRINGE TOOMEY IRRIG 70ML (MISCELLANEOUS) ×1 IMPLANT
TAPE SUT 30 1/2 CRC GREEN (SUTURE) ×5 IMPLANT
TIP FAN IRRIG PULSAVAC PLUS (DISPOSABLE) ×1 IMPLANT
WATER STERILE IRR 1000ML POUR (IV SOLUTION) ×4 IMPLANT
WATER STERILE IRR 500ML POUR (IV SOLUTION) ×2 IMPLANT

## 2022-02-22 NOTE — Op Note (Signed)
02/22/2022 ? ?2:58 PM ? ?Patient:   Nancy Mack ? ?Pre-Op Diagnosis:   Degenerative joint disease, left shoulder. ? ?Post-Op Diagnosis:   Same. ? ?Procedure:   Left total shoulder arthroplasty. ? ?Surgeon:   Kurtis Bushman, MD ? ?Assistant:  Carlynn Spry, PA-C ? ?Anesthesia:   General endotracheal intubation with an interscalene block. ? ?Findings:   As above. The rotator cuff was in satisfactory condition. ? ?Complications:   None ? ?EBL:  50 cc ? ?Drains:   None ? ?Closure:   Staples ? ?Implants:   Tornier Flex system 43 mm HIGH Eccentric head, size 2B PTC humeral stem, 30 mm pegged glenoid ? ?Brief Clinical Note:   The patient is a 76 year old female with severe, chronic left shoulder pain. The patient presents at this time for a left total shoulder arthroplasty. ? ?Procedure:   The patient was brought into the operating room and lain in the supine position on the OR table. After adequate IV sedation was achieved, an interscalene block was placed by the anesthesiologist. The patient then underwent general endotracheal intubation and anesthesia before being repositioned in the beach chair position using the beach chair positioner. A Foley catheter was placed by the nurse. The left shoulder and upper extremity were prepped with ChloraPrep solution before being draped sterilely. Preoperative antibiotics were administered. A standard anterior approach to the shoulder was made through an approximately 4-5 inch incision. The incision was carried down through the subcutaneous tissues to expose the deltopectoral fascia. The interval between the deltoid and pectoralis muscles was identified and this plane developed, retracting the cephalic vein laterally with the deltoid muscle. The conjoined tendon was identified. The lateral margin was dissected and the Kolbel self-retraining retractor inserted. The "three sisters" were identified and cauterized. Bursal tissues were removed to improve visualization. The  subscapularis tendon was released from its attachment to the lesser tuberosity 1 cm proximal to its insertion and several tagging sutures placed. The inferior capsule was released with care after identifying and protecting the axillary nerve. The proximal humeral cut was made at approximately 30? of retroversion using the extra-medullary guide.  ? ?Attention was directed to the glenoid. The labrum was debrided circumferentially before the center of the glenoid was marked with electrocautery. The small and medium sizers were positioned and it was elected to proceed with a small glenoid component. The guidewire was drilled into the glenoid neck using the appropriate guide. After verifying its position, the glenoid was lightly reamed with the butterfly reamer before the centralizing reamer was used. The small peripheral peg guide was positioned and each of the three pegs drilled sequentially, leaving a peg in place so as to minimize shifting of the guide. The bony surfaces were prepared for cementing by irrigating them thoroughly with bacitracin saline solution using the jet lavage system. Meanwhile, cement was mixed on the back table. When it was ready, some cement was injected into each of the three peg holes using a Toomey syringe and additional cement applied to the posterior aspect of the glenoid component. The component was impacted into place and the excess cement was removed. Pressure was maintained on the glenoid until the cement hardened. ? ?Attention was directed to the humeral side. The humeral canal was opened and then progressing up to a size 2 broach. This provided excellent fit. A trial reduction performed with a 43 mm high eccentric head. The arm demonstrated excellent range of motion as the hand could be brought across the chest to  the opposite shoulder and brought to the top of the patient's head and to the patient's ear. The shoulder remained stable throughout this range of motion, and was stable  with abduction and external rotation. The joint was dislocated and the trial components removed. The final components were impacted into place with care taken to maintain the appropriate version. Again, the [facility] taper locking mechanism was verified using manual distraction. The shoulder was relocated and again placed through a range of motion with the findings as described above. ? ?The wound was copiously irrigated with bacitracin saline solution using the jet lavage system. The subscapularis tendon was reapproximated using 91m cottony Dacron sutures. The deltopectoral interval was closed using #0 Vicryl interrupted sutures before the subcutaneous tissues were closed using 2-0 Vicryl interrupted sutures. The skin was closed using staples. A sterile occlusive dressing was applied to the wound before the arm was placed into a sling. The patient was then transferred back to a hospital bed before being awakened, extubated, and returned to the recovery room in satisfactory condition after tolerating the procedure well. ? ?

## 2022-02-22 NOTE — Discharge Instructions (Signed)
Interscalene Nerve Block with Exparel ? ? For your surgery you have received an Interscalene Nerve Block with Exparel. ?Nerve Blocks affect many types of nerves, including nerves that control movement, pain and normal sensation.  You may experience feelings such as numbness, tingling, heaviness, weakness or the inability to move your arm or the feeling or sensation that your arm has "fallen asleep". ?A nerve block with Exparel can last up to 5 days.  Usually the weakness wears off first.  The tingling and heaviness usually wear off next.  Finally you may start to notice pain.  Keep in mind that this may occur in any order.  Once a nerve block starts to wear off it is usually completely gone within 60 minutes. ?ISNB may cause mild shortness of breath, a hoarse voice, blurry vision, unequal pupils, or drooping of the face on the same side as the nerve block.  These symptoms will usually resolve with the numbness.  Very rarely the procedure itself can cause mild seizures. ?If needed, your surgeon will give you a prescription for pain medication.  It will take about 60 minutes for the oral pain medication to become fully effective.  So, it is recommended that you start taking this medication before the nerve block first begins to wear off, or when you first begin to feel discomfort. ?Take your pain medication only as prescribed.  Pain medication can cause sedation and decrease your breathing if you take more than you need for the level of pain that you have. ?Nausea is a common side effect of many pain medications.  You may want to eat something before taking your pain medicine to prevent nausea. ?After an Interscalene nerve block, you cannot feel pain, pressure or extremes in temperature in the effected arm.  Because your arm is numb it is at an increased risk for injury.  To decrease the possibility of injury, please practice the following: ? ?While you are awake change the position of your arm frequently to  prevent too much pressure on any one area for prolonged periods of time. ? If you have a cast or tight dressing, check the color or your fingers every couple of hours.  Call your surgeon with the appearance of any discoloration (white or blue). ?If you are given a sling to wear before you go home, please wear it  at all times until the block has completely worn off.  Do not get up at night without your sling. ?Please contact [facility] or your surgeon if you do not begin to regain sensation after 7 days from the surgery.  Anesthesia may be contacted by calling the Same Day Surgery Department, Mon. through Fri., 6 am to 4 pm at [phone].   ?If you experience any other problems or concerns, please contact your surgeon's office. ?If you experience severe or prolonged shortness of breath go to the nearest emergency department.  ?

## 2022-02-22 NOTE — Anesthesia Postprocedure Evaluation (Signed)
Anesthesia Post Note ? ?Patient: Nancy Mack ? ?Procedure(s) Performed: TOTAL SHOULDER ARTHROPLASTY (Left: Shoulder) ? ?Patient location during evaluation: PACU ?Anesthesia Type: General ?Level of consciousness: awake and alert ?Pain management: pain level controlled ?Vital Signs Assessment: post-procedure vital signs reviewed and stable ?Respiratory status: spontaneous breathing, nonlabored ventilation, respiratory function stable and patient connected to nasal cannula oxygen ?Cardiovascular status: blood pressure returned to baseline and stable ?Postop Assessment: no apparent nausea or vomiting ?Anesthetic complications: no ? ? ?No notable events documented. ? ? ?Last Vitals:  ?Vitals:  ? 02/22/22 1530 02/22/22 1545  ?BP: 104/63 112/65  ?Pulse: 84 77  ?Resp: 19 14  ?Temp:    ?SpO2: 100% 93%  ?  ?Last Pain:  ?Vitals:  ? 02/22/22 1545  ?TempSrc:   ?PainSc: Asleep  ? ? ?  ?  ?  ?  ?  ?  ? ?Arita Miss ? ? ? ? ?

## 2022-02-22 NOTE — Anesthesia Procedure Notes (Signed)
Anesthesia Regional Block: Interscalene brachial plexus block   Pre-Anesthetic Checklist: , timeout performed,  Correct Patient, Correct Site, Correct Laterality,  Correct Procedure, Correct Position, site marked,  Risks and benefits discussed,  Surgical consent,  Pre-op evaluation,  At surgeon's request and post-op pain management  Laterality: Left  Prep: chloraprep       Needles:  Injection technique: Single-shot  Needle Type: Echogenic Needle     Needle Length: 4cm  Needle Gauge: 25     Additional Needles:   Procedures:,,,, ultrasound used (permanent image in chart),,    Narrative:  Injection made incrementally with aspirations every 5 mL.  Performed by: Personally  Anesthesiologist: Sherryll Skoczylas, MD  Additional Notes: Patient's chart reviewed and they were deemed appropriate candidate for procedure, at surgeon's request. Patient educated about risks, benefits, and alternatives of the block including but not limited to: temporary or permanent nerve damage, bleeding, infection, damage to surround tissues, pneumothorax, hemidiaphragmatic paralysis, unilateral Horner's syndrome, block failure, local anesthetic toxicity. Patient expressed understanding. A formal time-out was conducted consistent with institution rules.  Monitors were applied, and minimal sedation used (see nursing record). The site was prepped with skin prep and allowed to dry, and sterile gloves were used. A high frequency linear ultrasound probe with probe cover was utilized throughout. C5-7 nerve roots located and appeared anatomically normal, local anesthetic injected around them, and echogenic block needle trajectory was monitored throughout. Aspiration performed every 5ml. Lung and blood vessels were avoided. All injections were performed without resistance and free of blood and paresthesias. The patient tolerated the procedure well.  Injectate: 10ml exparel + 10ml 0.5% bupivacaine      

## 2022-02-22 NOTE — Anesthesia Preprocedure Evaluation (Signed)
Anesthesia Evaluation  ?Patient identified by MRN, date of birth, ID band ?Patient awake ? ? ? ?Reviewed: ?Allergy & Precautions, NPO status , Patient's Chart, lab work & pertinent test results ? ?History of Anesthesia Complications ?Negative for: history of anesthetic complications ? ?Airway ?Mallampati: II ? ?TM Distance: >3 FB ?Neck ROM: Full ? ? ? Dental ?no notable dental hx. ?(+) Teeth Intact ?  ?Pulmonary ?asthma , neg sleep apnea, former smoker,  ?Well controlled asthma on maintenance meds ?  ?breath sounds clear to auscultation- rhonchi ?(-) wheezing ? ? ? ? ? Cardiovascular ?hypertension, Pt. on medications ?(-) CAD, (-) Past MI, (-) Cardiac Stents and (-) CABG  ?Rhythm:Regular Rate:Normal ?- Systolic murmurs and - Diastolic murmurs ? ?  ?Neuro/Psych ?neg Seizures negative neurological ROS ? negative psych ROS  ? GI/Hepatic ?Neg liver ROS, GERD  Medicated and Controlled,  ?Endo/Other  ?negative endocrine ROSneg diabetes ? Renal/GU ?negative Renal ROS  ? ?  ?Musculoskeletal ? ?(+) Arthritis ,  ? Abdominal ?(+) - obese,   ?Peds ? 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    Comment:  right foot ?No date: Osteoporosis ?No date: Raynaud's disease ?No date: Rhinitis due to pollen ?No date: SOB (shortness of breath) ? ? Reproductive/Obstetrics ? ?  ? ? ? ? ? ? ? ? ? ? ? ? ? ?  ?  ? ? ? ? ? ? ? ? ?Anesthesia Physical ? ?Anesthesia Plan ? ?ASA: 2 ? ?Anesthesia Plan: General  ? ?Post-op Pain Management: Regional block* and Ofirmev IV (intra-op)*  ? ?Induction: Intravenous ? ?PONV Risk Score and Plan: 3 and Ondansetron, Dexamethasone and Midazolam ? ?Airway Management Planned: Oral ETT ? ?Additional  Equipment: None ? ?Intra-op Plan:  ? ?Post-operative Plan: Extubation in OR ? ?Informed Consent: I have reviewed the patients History and Physical, chart, labs and discussed the procedure including the risks, benefits and alternatives for the proposed anesthesia with the patient or authorized representative who has indicated his/her understanding and acceptance.  ? ? ? ?Dental advisory given ? ?Plan Discussed with: CRNA and Anesthesiologist ? ?Anesthesia Plan Comments: (Discussed risks of anesthesia with patient, including PONV, sore throat, lip/dental/eye damage. Rare risks discussed as well, such as cardiorespiratory and neurological sequelae, and allergic reactions. Discussed the role of CRNA in patient's perioperative care. Patient understands. ?Discussed r/b/a of interscalene block, including elective nature. Risks discussed: ?- Rare: bleeding, infection, nerve damage ?- shortness of breath from hemidiaphragmatic paralysis ?- unilateral horner's syndrome ?- poor/non-working blocks ?- reactions and toxicity to local anesthetic ?Patient understands and agrees. ?)  ? ? ? ? ? ? ?Anesthesia Quick Evaluation ? ?

## 2022-02-22 NOTE — Transfer of Care (Signed)
Immediate Anesthesia Transfer of Care Note ? ?Patient: Nancy Mack ? ?Procedure(s) Performed: TOTAL SHOULDER ARTHROPLASTY (Left: Shoulder) ? ?Patient Location: PACU ? ?Anesthesia Type:General ? ?Level of Consciousness: sedated ? ?Airway & Oxygen Therapy: Patient Spontanous Breathing and Patient connected to nasal cannula oxygen ? ?Post-op Assessment: Report given to RN and Post -op Vital signs reviewed and stable ? ?Post vital signs: Reviewed and stable ? ?Last Vitals:  ?Vitals Value Taken Time  ?BP 114/70 02/22/22 1525  ?Temp    ?Pulse 83 02/22/22 1525  ?Resp 17 02/22/22 1525  ?SpO2 95 % 02/22/22 1525  ?Vitals shown include unvalidated device data. ? ?Last Pain:  ?Vitals:  ? 02/22/22 1055  ?TempSrc: Temporal  ?PainSc: 5   ?   ? ?  ? ?Complications: No notable events documented. ?

## 2022-02-22 NOTE — Anesthesia Procedure Notes (Signed)
Procedure Name: Intubation ?Date/Time: 02/22/2022 12:50 PM ?Performed by: Johnna Acosta, CRNA ?Pre-anesthesia Checklist: Patient identified, Emergency Drugs available, Suction available, Patient being monitored and Timeout performed ?Patient Re-evaluated:Patient Re-evaluated prior to induction ?Oxygen Delivery Method: Circle system utilized ?Preoxygenation: Pre-oxygenation with 100% oxygen ?Induction Type: IV induction ?Ventilation: Mask ventilation without difficulty ?Laryngoscope Size: McGraph and 3 ?Grade View: Grade I ?Tube type: Oral ?Tube size: 6.5 mm ?Number of attempts: 1 ?Airway Equipment and Method: Stylet and Video-laryngoscopy ?Placement Confirmation: ETT inserted through vocal cords under direct vision, positive ETCO2 and breath sounds checked- equal and bilateral ?Secured at: 20 cm ?Tube secured with: Tape ?Dental Injury: Teeth and Oropharynx as per pre-operative assessment  ? ? ? ? ?

## 2022-02-22 NOTE — H&P (Signed)
The patient has been re-examined, and the chart reviewed, and there have been no interval changes to the documented history and physical.  Plan a left total shoulder today. ? ?Anesthesia is consulted regarding a peripheral nerve block for post-operative pain. ? ?The risks, benefits, and alternatives have been discussed at length, and the patient is willing to proceed.   ? ?

## 2022-02-23 ENCOUNTER — Encounter: Payer: Self-pay | Admitting: Orthopedic Surgery

## 2022-02-23 DIAGNOSIS — J45909 Unspecified asthma, uncomplicated: Secondary | ICD-10-CM | POA: Diagnosis not present

## 2022-02-23 DIAGNOSIS — N189 Chronic kidney disease, unspecified: Secondary | ICD-10-CM | POA: Diagnosis not present

## 2022-02-23 DIAGNOSIS — M19012 Primary osteoarthritis, left shoulder: Secondary | ICD-10-CM | POA: Diagnosis not present

## 2022-02-23 DIAGNOSIS — I129 Hypertensive chronic kidney disease with stage 1 through stage 4 chronic kidney disease, or unspecified chronic kidney disease: Secondary | ICD-10-CM | POA: Diagnosis not present

## 2022-02-23 MED ORDER — HYDROCODONE-ACETAMINOPHEN 5-325 MG PO TABS: 1.0000 | ORAL_TABLET | ORAL | 0 refills | Status: DC | PRN

## 2022-02-23 MED ORDER — DOCUSATE SODIUM 100 MG PO CAPS: 100.0000 mg | ORAL_CAPSULE | Freq: Two times a day (BID) | ORAL | 0 refills | Status: DC

## 2022-02-23 MED ORDER — METHOCARBAMOL 500 MG PO TABS
500.0000 mg | ORAL_TABLET | Freq: Four times a day (QID) | ORAL | 1 refills | Status: DC | PRN
Start: 2022-02-23 — End: 2022-06-13

## 2022-02-23 MED ORDER — KETOROLAC TROMETHAMINE 15 MG/ML IJ SOLN
INTRAMUSCULAR | Status: AC
  Administered 2022-02-23: 06:00:00 7.5 mg via INTRAVENOUS
  Filled 2022-02-23: qty 1

## 2022-02-23 MED ORDER — ASPIRIN 325 MG PO TBEC: 325.0000 mg | DELAYED_RELEASE_TABLET | Freq: Every day | ORAL | 0 refills | Status: DC

## 2022-02-23 MED ORDER — CEFAZOLIN SODIUM-DEXTROSE 1-4 GM/50ML-% IV SOLN
INTRAVENOUS | Status: AC
  Administered 2022-02-23: 06:00:00 1 g via INTRAVENOUS
  Filled 2022-02-23: qty 50

## 2022-02-23 NOTE — Progress Notes (Signed)
?  Subjective: ? ?Patient reports pain as mild.   ? ?Objective:  ? ?VITALS:   ?Vitals:  ? 02/22/22 1630 02/22/22 1822 02/22/22 2000 02/22/22 2347  ?BP: 119/74 118/74 122/74 110/69  ?Pulse: 78 76 87 94  ?Resp: '14 16 16 17  '$ ?Temp: (!) 97.2 ?F (36.2 ?C) 97.7 ?F (36.5 ?C) (!) 96.9 ?F (36.1 ?C) 97.7 ?F (36.5 ?C)  ?TempSrc:   Temporal Temporal  ?SpO2: 94% 95% 94% 92%  ?Weight:      ?Height:      ? ? ?PHYSICAL EXAM: ? ?Neurologically intact ?ABD soft ?Neurovascular intact ?Sensation intact distally ?Intact pulses distally ?Dorsiflexion/Plantar flexion intact ?Incision: dressing C/D/I ?No cellulitis present ?Compartment soft ? ?LABS ? ?Results for orders placed or performed during the hospital encounter of 02/22/22 (from the past 24 hour(s))  ?ABO/Rh     Status: None  ? Collection Time: 02/22/22 11:02 AM  ?Result Value Ref Range  ? ABO/RH(D)    ?  O POS ?Performed at [facility], [address], [place], [place] [address] ?  ? ? ?DG Shoulder Left ? ?Result Date: 02/22/2022 ?CLINICAL DATA:  Left shoulder replacement EXAM: LEFT SHOULDER - 2+ VIEW COMPARISON:  None. FINDINGS: Left total shoulder arthroplasty. No evidence of complication on these images. IMPRESSION: Post left total shoulder arthroplasty. Electronically Signed   By: Macy Mis M.D.   On: 02/22/2022 16:01  ? ?[place] OR NERVE BLOCK-IMAGE ONLY [facility]) ? ?Result Date: 02/22/2022 ?There is no interpretation for this exam.  This order is for images obtained during a surgical procedure.  Please See "Surgeries" Tab for more information regarding the procedure.   ? ?Assessment/Plan: ?1 Day Post-Op  ? ?Principal Problem: ?  Status post total shoulder replacement, left ? ? ?Advance diet ?Up with therapy ?D/C home today ? ? ?Carlynn Spry , PA-C ?02/23/2022, 6:12 AM ? ? ? ? ?  ?

## 2022-02-23 NOTE — Plan of Care (Signed)
Adequate for discharge.

## 2022-02-23 NOTE — Discharge Summary (Signed)
Physician Discharge Summary  Patient ID: [name] MRN: [id number] DOB/AGE: June 01, 1946 76 y.o.  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, left shoulder Principal Problem:   Status post total shoulder replacement, left   Past Medical History:  Diagnosis Date   Allergy    Anemia    Arthritis    Asthma    Carpal tunnel syndrome    Chronic kidney disease    Dyspnea    Eczema    Globus pharyngeus    Hyperlipidemia    Hypertension    Laryngopharyngeal reflux    Menopause    Neuroma    right foot   Osteoporosis    Pneumonia    Raynaud's disease    Rhinitis due to pollen    SOB (shortness of breath)     Surgeries: Procedure(s): TOTAL SHOULDER ARTHROPLASTY on 02/22/2022   Consultants (if any):   Discharged Condition: Improved  Hospital Course: Nancy Mack is an 76 y.o. 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    She was given perioperative antibiotics:  Anti-infectives (From admission, onward)    Start     Dose/Rate Route Frequency Ordered Stop   02/22/22 2300  ceFAZolin (ANCEF) 1-4 GM/50ML-% IVPB       Note to Pharmacy: Ronnell Freshwater D: cabinet override      02/22/22 2300 02/22/22 2341   02/22/22 1800  ceFAZolin (ANCEF) IVPB 1 g/50 mL premix        1 g 100 mL/hr over 30 Minutes Intravenous Every 6 hours 02/22/22 1634 02/23/22 0639   02/22/22 1705  ceFAZolin (ANCEF) 2-4 GM/100ML-% IVPB  Status:  Discontinued       Note to Pharmacy: Sharmon Leyden A: cabinet override      02/22/22 1705 02/22/22 1713   02/22/22 1115  ceFAZolin (ANCEF) IVPB 2g/100 mL premix        2 g 200 mL/hr over 30  Minutes Intravenous  Once 02/22/22 1107 02/22/22 1307   02/22/22 1102  ceFAZolin (ANCEF) 2-4 GM/100ML-% IVPB       Note to Pharmacy: Register, Santiago Glad A: cabinet override      02/22/22 1102 02/22/22 1349     .  She was given sequential compression devices, early ambulation, and aspirin '325mg'$  daily for 30 days for DVT prophylaxis.  She benefited maximally from the hospital stay and there were no complications.    Recent vital signs:  Vitals:   02/22/22 2000 02/22/22 2347  BP: 122/74 110/69  Pulse: 87 94  Resp: 16 17  Temp: (!) 96.9 F (36.1 C) 97.7 F (36.5 C)  SpO2: 94% 92%    Recent laboratory studies:  Lab Results  Component Value Date   HGB 12.6 02/10/2022   HGB 12.9 12/13/2021   HGB 12.0 05/04/2021   Lab Results  Component Value Date   WBC 5.8 02/10/2022   PLT 321 02/10/2022   No results found for: INR Lab Results  Component Value Date   NA 139 02/10/2022   K 3.2 (L) 02/10/2022   CL 101 02/10/2022   CO2 29 02/10/2022   BUN 12 02/10/2022   CREATININE 0.97 02/10/2022   GLUCOSE 108 (H) 02/10/2022    Discharge Medications:   Allergies  as of 02/23/2022       Reactions   Azithromycin Rash   Lactose Intolerance (gi) Diarrhea   Prednisone Palpitations, Other (See Comments)   increased heartrate   Tape Rash   Adhesive tape        Medication List     STOP taking these medications    aspirin 81 MG tablet Replaced by: aspirin 325 MG EC tablet       TAKE these medications    acetaminophen 500 MG tablet Commonly known as: TYLENOL Take 1,000 mg by mouth every 6 (six) hours as needed.   albuterol 108 (90 Base) MCG/ACT inhaler Commonly known as: VENTOLIN HFA Inhale 2 puffs into the lungs every 6 (six) hours as needed.   Allergy Relief 180 MG tablet Generic drug: fexofenadine Take 1 tablet (180 mg total) by mouth daily.   aspirin 325 MG EC tablet Take 1 tablet (325 mg total) by mouth daily. Replaces: aspirin 81 MG tablet   atorvastatin 40 MG  tablet Commonly known as: LIPITOR Take 1 tablet (40 mg total) by mouth daily.   azelastine 0.1 % nasal spray Commonly known as: ASTELIN Place 1 spray 2 (two) times daily into both nostrils. Use in each nostril as directed   Biotin 1000 MCG tablet Take 1,000 mcg by mouth daily.   docusate sodium 100 MG capsule Commonly known as: COLACE Take 1 capsule (100 mg total) by mouth 2 (two) times daily.   estradiol 0.5 MG tablet Commonly known as: ESTRACE Take 1 tablet (0.5 mg total) by mouth daily.   fluticasone 50 MCG/ACT nasal spray Commonly known as: FLONASE Place 2 sprays into both nostrils daily. What changed:  how much to take when to take this   fluticasone-salmeterol 500-50 MCG/ACT Aepb Commonly known as: ADVAIR Inhale 1 puff into the lungs in the morning and at bedtime.   hydrochlorothiazide 25 MG tablet Commonly known as: HYDRODIURIL Take 1 tablet (25 mg total) by mouth daily.   HYDROcodone-acetaminophen 5-325 MG tablet Commonly known as: NORCO/VICODIN Take 1 tablet by mouth every 4 (four) hours as needed for moderate pain (pain score 4-6).   methocarbamol 500 MG tablet Commonly known as: ROBAXIN Take 1 tablet (500 mg total) by mouth every 6 (six) hours as needed for muscle spasms.   montelukast 10 MG tablet Commonly known as: SINGULAIR Take 1 tablet (10 mg total) by mouth at bedtime.   mupirocin ointment 2 % Commonly known as: BACTROBAN Apply 1 application topically daily. With dressing changes   pantoprazole 20 MG tablet Commonly known as: PROTONIX Take 1 tablet (20 mg total) by mouth 2 (two) times daily.   triamcinolone cream 0.1 % Commonly known as: KENALOG Apply 1 application topically 2 (two) times daily. What changed:  when to take this reasons to take this   vitamin B-12 1000 MCG tablet Commonly known as: CYANOCOBALAMIN Take 1,000 mcg by mouth daily.   vitamin C 100 MG tablet Take 100 mg by mouth daily.   Vitamin D 50 MCG (2000 UT)  tablet Take 2,000 Units by mouth daily.        Diagnostic Studies: CT SHOULDER LEFT WO CONTRAST  Result Date: 02/10/2022 CLINICAL DATA:  Preoperative planning for surgery scheduled 02/22/2022. Left shoulder pain with limited range of motion for 1-2 years. EXAM: CT OF THE UPPER LEFT EXTREMITY WITHOUT CONTRAST TECHNIQUE: Multidetector CT imaging of the upper left extremity was performed according to the standard protocol. RADIATION DOSE REDUCTION: This exam was performed according to the departmental  dose-optimization program which includes automated exposure control, adjustment of the mA and/or kV according to patient size and/or use of iterative reconstruction technique. COMPARISON:  None. FINDINGS: Bones/Joint/Cartilage No fracture or dislocation. Normal alignment. No joint effusion. Severe osteoarthritis of the glenohumeral joint with severe joint space narrowing, a bone-on-bone appearance, subchondral sclerosis, and marginal osteophytosis. Normal glenoid bone stock. Mild arthropathy of the acromioclavicular joint. Ligaments Ligaments are suboptimally evaluated by CT. Muscles and Tendons Muscles are normal.  No muscle atrophy. Soft tissue No fluid collection or hematoma. No soft tissue mass. Mild lingular scarring. Thoracic aortic atherosclerosis. IMPRESSION: 1. Severe osteoarthritis of the right glenohumeral joint. 2. Aortic Atherosclerosis (ICD10-I70.0). Electronically Signed   By: Kathreen Devoid M.D.   On: 02/10/2022 06:11   DG Shoulder Left  Result Date: 02/22/2022 CLINICAL DATA:  Left shoulder replacement EXAM: LEFT SHOULDER - 2+ VIEW COMPARISON:  None. FINDINGS: Left total shoulder arthroplasty. No evidence of complication on these images. IMPRESSION: Post left total shoulder arthroplasty. Electronically Signed   By: Macy Mis M.D.   On: 02/22/2022 16:01   [place] OR NERVE BLOCK-IMAGE ONLY [facility])  Result Date: 02/22/2022 There is no interpretation for this exam.  This order is for images  obtained during a surgical procedure.  Please See "Surgeries" Tab for more information regarding the procedure.    Disposition: Discharge disposition: 01-Home or Self Care            Signed: Carlynn Spry ,PA-C 02/23/2022, 6:16 AM

## 2022-02-23 NOTE — Evaluation (Signed)
Physical Therapy Evaluation ?Patient Details ?Name: Nancy Mack ?MRN: [id number] ?DOB: 1946/01/28 ?Today's Date: 02/23/2022 ? ?History of Present Illness ? Pt is a 76 y.o. 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  PMH includes asthma, htn, CTS, laryngopharyngeal reflux, neuroma R foot, Raynaud's disease, and SOB.  ?Clinical Impression ? Prior to surgery, pt was independent with functional mobility; lives with her husband in 1 level home.  Currently pt is modified independent with bed mobility (has adjustable bed at home); independent with transfers; and SBA ambulating 160 feet (no AD use).  Pt steady and safe with all functional mobility during sessions activities.  Mild L shoulder pain noted during session (L UE supported in sling).  Educated pt on fall safety and home modifications to prevent falls--pt verbalizing understanding.  Pt would benefit from skilled PT to address noted impairments and functional limitations during hospitalization (see below for any additional details).  Upon hospital discharge, pt reports having OP PT set up at [facility] for next week.   ? ?Recommendations for follow up therapy are one component of a multi-disciplinary discharge planning process, led by the attending physician.  Recommendations may be updated based on patient status, additional functional criteria and insurance authorization. ? ?Follow Up Recommendations Outpatient PT (pt reports having OP PT set-up for next week at [facility]) ? ?  ?Assistance Recommended at Discharge Set up Supervision/Assistance  ?Patient can return home with the following ? A little help with bathing/dressing/bathroom;Assistance with cooking/housework;Assist for transportation;Help with stairs or ramp for entrance ? ?  ?Equipment Recommendations None recommended by PT  ?Recommendations for Other Services ? OT consult  ?  ?Functional Status Assessment Patient has had a recent  decline in their functional status and demonstrates the ability to make significant improvements in function in a reasonable and predictable amount of time.  ? ?  ?Precautions / Restrictions Precautions ?Precautions: Shoulder ?Shoulder Interventions: Shoulder sling/immobilizer ?Restrictions ?Weight Bearing Restrictions: Yes ?LUE Weight Bearing: Non weight bearing  ? ?  ? ?Mobility ? Bed Mobility ?Overal bed mobility: Modified Independent ?  ?  ?  ?  ?  ?  ?General bed mobility comments: Semi-supine to/from sitting with HOB elevated (pt has adjustable bed at home) ?  ? ?Transfers ?Overall transfer level: Independent ?Equipment used: None ?  ?  ?  ?  ?  ?  ?  ?General transfer comment: steady safe transfer from bed and from toilet ?  ? ?Ambulation/Gait ?Ambulation/Gait assistance: Supervision ?Gait Distance (Feet): 160 Feet ?Assistive device: None ?Gait Pattern/deviations: WFL(Within Functional Limits) ?Gait velocity: mildly decreased ?  ?  ?General Gait Details: steady step through gait pattern ? ?Stairs ?  ?  ?  ?  ?  ? ?Wheelchair Mobility ?  ? ?Modified Rankin (Stroke Patients Only) ?  ? ?  ? ?Balance Overall balance assessment: Needs assistance ?Sitting-balance support: No upper extremity supported, Feet supported ?Sitting balance-Leahy Scale: Normal ?Sitting balance - Comments: steady sitting reaching within BOS with R UE ?  ?Standing balance support: No upper extremity supported, During functional activity ?Standing balance-Leahy Scale: Good ?Standing balance comment: steady ambulation ?  ?  ?  ?  ?  ?  ?  ?  ?  ?  ?  ?   ? ? ? ?Pertinent Vitals/Pain Pain Assessment ?Pain Assessment: 0-10 ?Pain Score: 3  ?Pain Location: L shoulder/armpit area ?Pain Descriptors / Indicators: Sore ?Pain Intervention(s): Limited activity within patient's tolerance, Monitored during session,  Premedicated before session, Repositioned, Ice applied ?Vitals (HR and O2 on room air) stable and WFL throughout treatment session.  ? ? ?Home  Living Family/patient expects to be discharged to:: Private residence ?Living Arrangements: Spouse/significant other ?Available Help at Discharge: Family;Available 24 hours/day ?Type of Home: House ?Home Access: Level entry ?  ?  ?  ?Home Layout: One level ?Home Equipment: None ?   ?  ?Prior Function Prior Level of Function : Independent/Modified Independent ?  ?  ?  ?  ?  ?  ?Mobility Comments: No falls in past 6 months. ?  ?  ? ? ?Hand Dominance  ? Dominant Hand: Left ? ?  ?Extremity/Trunk Assessment  ? Upper Extremity Assessment ?Upper Extremity Assessment: Defer to OT evaluation;LUE deficits/detail (R UE WFL) ?LUE Deficits / Details: s/p L TSA ?LUE: Unable to fully assess due to immobilization ?  ? ?Lower Extremity Assessment ?Lower Extremity Assessment: Overall WFL for tasks assessed ?  ? ?Cervical / Trunk Assessment ?Cervical / Trunk Assessment: Normal  ?Communication  ? Communication: No difficulties  ?Cognition Arousal/Alertness: Awake/alert ?Behavior During Therapy: [facility] for tasks assessed/performed ?Overall Cognitive Status: Within Functional Limits for tasks assessed ?  ?  ?  ?  ?  ?  ?  ?  ?  ?  ?  ?  ?  ?  ?  ?  ?  ?  ?  ? ?  ?General Comments  Pt agreeable to PT session. ? ?  ?Exercises    ? ?Assessment/Plan  ?  ?PT Assessment Patient needs continued PT services  ?PT Problem List Decreased strength;Decreased range of motion;Decreased mobility;Decreased knowledge of precautions;Decreased skin integrity;Pain ? ?   ?  ?PT Treatment Interventions DME instruction;Gait training;Functional mobility training;Therapeutic activities;Therapeutic exercise;Balance training;Patient/family education   ? ?PT Goals (Current goals can be found in the Care Plan section)  ?Acute Rehab PT Goals ?Patient Stated Goal: to go home ?PT Goal Formulation: With patient ?Time For Goal Achievement: 03/09/22 ?Potential to Achieve Goals: Good ? ?  ?Frequency 7X/week ?  ? ? ?Co-evaluation   ?  ?  ?  ?  ? ? ?  ?AM-PAC PT "6 Clicks"  Mobility  ?Outcome Measure Help needed turning from your back to your side while in a flat bed without using bedrails?: None ?Help needed moving from lying on your back to sitting on the side of a flat bed without using bedrails?: None ?Help needed moving to and from a bed to a chair (including a wheelchair)?: None ?Help needed standing up from a chair using your arms (e.g., wheelchair or bedside chair)?: None ?Help needed to walk in hospital room?: A Little ?Help needed climbing 3-5 steps with a railing? : A Little ?6 Click Score: 22 ? ?  ?End of Session Equipment Utilized During Treatment: Gait belt;Other (comment) (L UE sling) ?Activity Tolerance: Patient tolerated treatment well ?Patient left: in bed;with call bell/phone within reach;with bed alarm set;with family/visitor present;with SCD's reapplied ?Nurse Communication: Mobility status;Precautions;Weight bearing status ?PT Visit Diagnosis: Other abnormalities of gait and mobility (R26.89);Muscle weakness (generalized) (M62.81);Pain ?Pain - Right/Left: Left ?Pain - part of body: Shoulder ?  ? ?Time: 9450-3888 ?PT Time Calculation (min) (ACUTE ONLY): 24 min ? ? ?Charges:   PT Evaluation ?$PT Eval Low Complexity: 1 Low ?PT Treatments ?$Therapeutic Activity: 8-22 mins ?  ?   ? ?Leitha Bleak, PT ?02/23/22, 10:10 AM ? ? ?

## 2022-02-23 NOTE — Evaluation (Signed)
Occupational Therapy Evaluation ?Patient Details ?Name: Nancy Mack ?MRN: [id number] ?DOB: [birth year] ?Today's Date: 02/23/2022 ? ? ?History of Present Illness Pt is a 76 y.o. 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  PMH includes asthma, htn, CTS, laryngopharyngeal reflux, neuroma R foot, Raynaud's disease, and SOB.  ? ?Clinical Impression ?  ?Patient was seen for an OT evaluation this date. Pt lives with her spouse in a 1 story home with no steps and spouse able to provide 24/7 care as needed. Prior to surgery, pt was active and independent. Pt has orders for LUE to be immobilized and will be NWBing per MD. Patient presents with impaired strength/ROM and sensation to LUE. These impairments result in a decreased ability to perform self care tasks requiring MIN A for UB/LB dressing and bathing and MOD A for application of sling. Abduction pillow found in pt's closet. Secure chat sent to MD, RN, PT - abduction pillow for comfort. Pt instructed in sling mgt, ROM exercises for LUE and precautions, adaptive strategies for bathing/dressing/toileting/grooming, positioning and considerations for sleep, and home/routines modifications to maximize falls prevention, safety, and independence. Handout provided. OT adjusted sling to improve comfort, optimize positioning, and to maximize skin integrity/safety. Pt verbalized understanding of all education/training provided. Follow up therapies as arranged by surgeon at discharge. No additional skilled acute OT needs at this time.     ? ?Recommendations for follow up therapy are one component of a multi-disciplinary discharge planning process, led by the attending physician.  Recommendations may be updated based on patient status, additional functional criteria and insurance authorization.  ? ?Follow Up Recommendations ? Follow physician's recommendations for discharge plan and follow up therapies  ?   ?Assistance Recommended at Discharge PRN  ?Patient can return home with the following A little help with bathing/dressing/bathroom;Assist for transportation;Assistance with cooking/housework ? ?  ?Functional Status Assessment ? Patient has had a recent decline in their functional status and demonstrates the ability to make significant improvements in function in a reasonable and predictable amount of time.  ?Equipment Recommendations ? None recommended by OT  ?  ?Recommendations for Other Services   ? ? ?  ?Precautions / Restrictions Precautions ?Precautions: Shoulder ?Shoulder Interventions: Shoulder sling/immobilizer ?Precaution Booklet Issued: Yes (comment) ?Restrictions ?Weight Bearing Restrictions: Yes ?LUE Weight Bearing: Non weight bearing ?Other Position/Activity Restrictions: Per MD, abduction pillow for comfort only  ? ?  ? ?Mobility Bed Mobility ?Overal bed mobility: Modified Independent ?  ?  ?  ?  ?  ?  ?  ?  ? ?Transfers ?Overall transfer level: Independent ?Equipment used: None ?  ?  ?  ?  ?  ?  ?  ?  ?  ? ?  ?Balance Overall balance assessment: Modified Independent ?  ?  ?  ?  ?  ?  ?  ?  ?  ?  ?  ?  ?  ?  ?  ?  ?  ?  ?   ? ?ADL either performed or assessed with clinical judgement  ? ?ADL Overall ADL's : Needs assistance/impaired ?  ?  ?  ?  ?  ?  ?  ?  ?  ?  ?  ?  ?  ?  ?  ?  ?  ?  ?  ?General ADL Comments: Pt requires PRN MIN A for LB dressing while LUE is immobilized, MOD A for sling mgt, and MIN A for UB dressing. Pt instructed  in how to provide instruction/cues for spouse to assist her at home.  ? ? ? ?Vision   ?   ?   ?Perception   ?  ?Praxis   ?  ? ?Pertinent Vitals/Pain Pain Assessment ?Pain Assessment: No/denies pain  ? ? ? ?Hand Dominance Left ?  ?Extremity/Trunk Assessment Upper Extremity Assessment ?Upper Extremity Assessment: LUE deficits/detail ?LUE Deficits / Details: s/p L TSA ?LUE: Unable to fully assess due to immobilization ?  ?Lower Extremity Assessment ?Lower Extremity  Assessment: Overall WFL for tasks assessed ?  ?Cervical / Trunk Assessment ?Cervical / Trunk Assessment: Normal ?  ?Communication Communication ?Communication: No difficulties ?  ?Cognition Arousal/Alertness: Awake/alert ?Behavior During Therapy: [facility] for tasks assessed/performed ?Overall Cognitive Status: Within Functional Limits for tasks assessed ?  ?  ?  ?  ?  ?  ?  ?  ?  ?  ?  ?  ?  ?  ?  ?  ?  ?  ?  ?General Comments    ? ?  ?Exercises Other Exercises ?Other Exercises: Pt educated in home/routines modifications, sling mgt, adaptive strategies for bathing and dressing, falls prevention, and LUE positioning for comfort and optimal joint alignment/adherence to precautions ?  ?Shoulder Instructions    ? ? ?Home Living Family/patient expects to be discharged to:: Private residence ?Living Arrangements: Spouse/significant other ?Available Help at Discharge: Family;Available 24 hours/day ?Type of Home: House ?Home Access: Level entry ?  ?  ?Home Layout: One level ?  ?  ?Bathroom Shower/Tub: Walk-in shower ?  ?Bathroom Toilet: Handicapped height (17 inch) ?  ?  ?Home Equipment: None ?  ?  ?  ? ?  ?Prior Functioning/Environment Prior Level of Function : Independent/Modified Independent ?  ?  ?  ?  ?  ?  ?Mobility Comments: No falls in past 6 months. ?  ?  ? ?  ?  ?OT Problem List: Impaired UE functional use;Decreased range of motion;Impaired sensation ?  ?   ?OT Treatment/Interventions:    ?  ?OT Goals(Current goals can be found in the care plan section) Acute Rehab OT Goals ?Patient Stated Goal: go home today ?OT Goal Formulation: All assessment and education complete, DC therapy  ?OT Frequency:   ?  ? ?Co-evaluation   ?  ?  ?  ?  ? ?  ?AM-PAC OT "6 Clicks" Daily Activity     ?Outcome Measure Help from another person eating meals?: None ?Help from another person taking care of personal grooming?: A Little ?Help from another person toileting, which includes using toliet, bedpan, or urinal?: None ?Help from another  person bathing (including washing, rinsing, drying)?: A Little ?Help from another person to put on and taking off regular upper body clothing?: A Little ?Help from another person to put on and taking off regular lower body clothing?: A Little ?6 Click Score: 20 ?  ?End of Session   ? ?Activity Tolerance: Patient tolerated treatment well ?Patient left: in bed;with call bell/phone within reach ? ?OT Visit Diagnosis: Other abnormalities of gait and mobility (R26.89)  ?              ?Time: 4627-0350 ?OT Time Calculation (min): 20 min ?Charges:  OT General Charges ?$OT Visit: 1 Visit ?OT Evaluation ?$OT Eval Moderate Complexity: 1 Mod ?OT Treatments ?$Self Care/Home Management : 8-22 mins ? ?Ardeth Perfect., MPH, MS, OTR/L ?ascom [phone] ?02/23/22, 10:34 AM ?

## 2022-02-24 LAB — SURGICAL PATHOLOGY

## 2022-03-01 ENCOUNTER — Telehealth: Payer: Self-pay | Admitting: *Deleted

## 2022-03-01 DIAGNOSIS — M25612 Stiffness of left shoulder, not elsewhere classified: Secondary | ICD-10-CM | POA: Diagnosis not present

## 2022-03-01 DIAGNOSIS — M25512 Pain in left shoulder: Secondary | ICD-10-CM | POA: Diagnosis not present

## 2022-03-01 NOTE — Telephone Encounter (Signed)
Transition Care Management Unsuccessful Follow-up Telephone Call ?  ?Date of discharge and from where:  [facility] 02-23-2022 ? ?Attempts:  1st Attempt ? ?Reason for unsuccessful TCM follow-up call:  Left voice message ? ?  ?

## 2022-03-02 NOTE — Telephone Encounter (Signed)
Transition Care Management Unsuccessful Follow-up Telephone Call ? ?Date of discharge and from where:  [facility] 02-23-2022 ? ?Attempts:  2nd Attempt ? ?Reason for unsuccessful TCM follow-up call:  Left voice message ? ?  ?

## 2022-03-06 ENCOUNTER — Ambulatory Visit (INDEPENDENT_AMBULATORY_CARE_PROVIDER_SITE_OTHER): Payer: Medicare Other | Admitting: *Deleted

## 2022-03-06 DIAGNOSIS — Z0000 Encounter for general adult medical examination without abnormal findings: Secondary | ICD-10-CM

## 2022-03-06 DIAGNOSIS — Z780 Asymptomatic menopausal state: Secondary | ICD-10-CM | POA: Diagnosis not present

## 2022-03-06 NOTE — Patient Instructions (Signed)
Nancy Mack , ?Thank you for taking time to come for your Medicare Wellness Visit. I appreciate your ongoing commitment to your health goals. Please review the following plan we discussed and let me know if I can assist you in the future.  ? ?Screening recommendations/referrals: ?Colonoscopy: up to date ?Mammogram: education provided ?Bone Density: education provided ?Recommended yearly ophthalmology/optometry visit for glaucoma screening and checkup ?Recommended yearly dental visit for hygiene and checkup ? ?Vaccinations: ?Influenza vaccine: up to date ?Pneumococcal vaccine: up to date ?Tdap vaccine: up to date ?Shingles vaccine: up to date   ? ?Advanced directives: Education provided ? ?Conditions/risks identified:  ? ?Next appointment: 06-13-2022 @ 8:20 Nancy Mack ? ? ?Preventive Care 38 Years and Older, Female ?Preventive care refers to lifestyle choices and visits with your health care provider that can promote health and wellness. ?What does preventive care include? ?A yearly physical exam. This is also called an annual well check. ?Dental exams once or twice a year. ?Routine eye exams. Ask your health care provider how often you should have your eyes checked. ?Personal lifestyle choices, including: ?Daily care of your teeth and gums. ?Regular physical activity. ?Eating a healthy diet. ?Avoiding tobacco and drug use. ?Limiting alcohol use. ?Practicing safe sex. ?Taking low-dose aspirin every day. ?Taking vitamin and mineral supplements as recommended by your health care provider. ?What happens during an annual well check? ?The services and screenings done by your health care provider during your annual well check will depend on your age, overall health, lifestyle risk factors, and family history of disease. ?Counseling  ?Your health care provider may ask you questions about your: ?Alcohol use. ?Tobacco use. ?Drug use. ?Emotional well-being. ?Home and relationship well-being. ?Sexual activity. ?Eating  habits. ?History of falls. ?Memory and ability to understand (cognition). ?Work and work Statistician. ?Reproductive health. ?Screening  ?You may have the following tests or measurements: ?Height, weight, and BMI. ?Blood pressure. ?Lipid and cholesterol levels. These may be checked every 5 years, or more frequently if you are over 71 years old. ?Skin check. ?Lung cancer screening. You may have this screening every year starting at age 3 if you have a 30-pack-year history of smoking and currently smoke or have quit within the past 15 years. ?Fecal occult blood test (FOBT) of the stool. You may have this test every year starting at age 87. ?Flexible sigmoidoscopy or colonoscopy. You may have a sigmoidoscopy every 5 years or a colonoscopy every 10 years starting at age 60. ?Hepatitis C blood test. ?Hepatitis B blood test. ?Sexually transmitted disease (STD) testing. ?Diabetes screening. This is done by checking your blood sugar (glucose) after you have not eaten for a while (fasting). You may have this done every 1-3 years. ?Bone density scan. This is done to screen for osteoporosis. You may have this done starting at age 79. ?Mammogram. This may be done every 1-2 years. Talk to your health care provider about how often you should have regular mammograms. ?Talk with your health care provider about your test results, treatment options, and if necessary, the need for more tests. ?Vaccines  ?Your health care provider may recommend certain vaccines, such as: ?Influenza vaccine. This is recommended every year. ?Tetanus, diphtheria, and acellular pertussis (Tdap, Td) vaccine. You may need a Td booster every 10 years. ?Zoster vaccine. You may need this after age 43. ?Pneumococcal 13-valent conjugate (PCV13) vaccine. One dose is recommended after age 52. ?Pneumococcal polysaccharide (PPSV23) vaccine. One dose is recommended after age 11. ?Talk to your health care provider about  which screenings and vaccines you need and how  often you need them. ?This information is not intended to replace advice given to you by your health care provider. Make sure you discuss any questions you have with your health care provider. ?Document Released: 12/31/2015 Document Revised: 08/23/2016 Document Reviewed: 10/05/2015 ?Elsevier Interactive Patient Education ? 2017 [place]. ? ?Fall Prevention in the Home ?Falls can cause injuries. They can happen to people of all ages. There are many things you can do to make your home safe and to help prevent falls. ?What can I do on the outside of my home? ?Regularly fix the edges of walkways and driveways and fix any cracks. ?Remove anything that might make you trip as you walk through a door, such as a raised step or threshold. ?Trim any bushes or trees on the path to your home. ?Use bright outdoor lighting. ?Clear any walking paths of anything that might make someone trip, such as rocks or tools. ?Regularly check to see if handrails are loose or broken. Make sure that both sides of any steps have handrails. ?Any raised decks and porches should have guardrails on the edges. ?Have any leaves, snow, or ice cleared regularly. ?Use sand or salt on walking paths during winter. ?Clean up any spills in your garage right away. This includes oil or grease spills. ?What can I do in the bathroom? ?Use night lights. ?Install grab bars by the toilet and in the tub and shower. Do not use towel bars as grab bars. ?Use non-skid mats or decals in the tub or shower. ?If you need to sit down in the shower, use a plastic, non-slip stool. ?Keep the floor dry. Clean up any water that spills on the floor as soon as it happens. ?Remove soap buildup in the tub or shower regularly. ?Attach bath mats securely with double-sided non-slip rug tape. ?Do not have throw rugs and other things on the floor that can make you trip. ?What can I do in the bedroom? ?Use night lights. ?Make sure that you have a light by your bed that is easy to  reach. ?Do not use any sheets or blankets that are too big for your bed. They should not hang down onto the floor. ?Have a firm chair that has side arms. You can use this for support while you get dressed. ?Do not have throw rugs and other things on the floor that can make you trip. ?What can I do in the kitchen? ?Clean up any spills right away. ?Avoid walking on wet floors. ?Keep items that you use a lot in easy-to-reach places. ?If you need to reach something above you, use a strong step stool that has a grab bar. ?Keep electrical cords out of the way. ?Do not use floor polish or wax that makes floors slippery. If you must use wax, use non-skid floor wax. ?Do not have throw rugs and other things on the floor that can make you trip. ?What can I do with my stairs? ?Do not leave any items on the stairs. ?Make sure that there are handrails on both sides of the stairs and use them. Fix handrails that are broken or loose. Make sure that handrails are as long as the stairways. ?Check any carpeting to make sure that it is firmly attached to the stairs. Fix any carpet that is loose or worn. ?Avoid having throw rugs at the top or bottom of the stairs. If you do have throw rugs, attach them to the floor with  carpet tape. ?Make sure that you have a light switch at the top of the stairs and the bottom of the stairs. If you do not have them, ask someone to add them for you. ?What else can I do to help prevent falls? ?Wear shoes that: ?Do not have high heels. ?Have rubber bottoms. ?Are comfortable and fit you well. ?Are closed at the toe. Do not wear sandals. ?If you use a stepladder: ?Make sure that it is fully opened. Do not climb a closed stepladder. ?Make sure that both sides of the stepladder are locked into place. ?Ask someone to hold it for you, if possible. ?Clearly mark and make sure that you can see: ?Any grab bars or handrails. ?First and last steps. ?Where the edge of each step is. ?Use tools that help you move  around (mobility aids) if they are needed. These include: ?Canes. ?Walkers. ?Scooters. ?Crutches. ?Turn on the lights when you go into a dark area. Replace any light bulbs as soon as they burn out. ?Set up your furniture so y

## 2022-03-06 NOTE — Progress Notes (Signed)
? ?Subjective:  ? Nancy Mack is a 76 y.o. female who presents for Medicare Annual (Subsequent) preventive examination. ? ?I connected with  Wylie L Zilka on 03/06/22 by a telephone enabled telemedicine application and verified that I am speaking with the correct person using two identifiers. ?  ?I discussed the limitations of evaluation and management by telemedicine. The patient expressed understanding and agreed to proceed. ? ?Patient location: home ? ?Provider location:   Tele-Health Visit not in office ? ? ? ?Review of Systems    ? ?Cardiac Risk Factors include: advanced age (>66mn, >>25women);hypertension;sedentary lifestyle;family history of premature cardiovascular disease ? ?   ?Objective:  ?  ?Today's Vitals  ? 03/06/22 0818  ?PainSc: 2   ? ?There is no height or weight on file to calculate BMI. ? ?Advanced Directives 03/06/2022 02/22/2022 02/10/2022 03/04/2021 02/23/2020 03/04/2019 02/20/2019  ?Does Patient Have a Medical Advance Directive? No No No No No No No  ?Does patient want to make changes to medical advance directive? - - - - - - -  ?Would patient like information on creating a medical advance directive? No - Patient declined No - Patient declined - - - - Yes (MAU/Ambulatory/Procedural Areas - Information given)  ? ? ?Current Medications (verified) ?Outpatient Encounter Medications as of 03/06/2022  ?Medication Sig  ? acetaminophen (TYLENOL) 500 MG tablet Take 1,000 mg by mouth every 6 (six) hours as needed.  ? albuterol (VENTOLIN HFA) 108 (90 Base) MCG/ACT inhaler Inhale 2 puffs into the lungs every 6 (six) hours as needed.  ? ALLERGY RELIEF 180 MG tablet Take 1 tablet (180 mg total) by mouth daily.  ? Ascorbic Acid (VITAMIN C) 100 MG tablet Take 100 mg by mouth daily.  ? aspirin EC 325 MG EC tablet Take 1 tablet (325 mg total) by mouth daily.  ? atorvastatin (LIPITOR) 40 MG tablet Take 1 tablet (40 mg total) by mouth daily.  ? azelastine (ASTELIN) 0.1 % nasal spray Place 1 spray 2 (two) times  daily into both nostrils. Use in each nostril as directed  ? Biotin 1000 MCG tablet Take 1,000 mcg by mouth daily.  ? Cholecalciferol (VITAMIN D) 50 MCG (2000 UT) tablet Take 2,000 Units by mouth daily.  ? docusate sodium (COLACE) 100 MG capsule Take 1 capsule (100 mg total) by mouth 2 (two) times daily.  ? estradiol (ESTRACE) 0.5 MG tablet Take 1 tablet (0.5 mg total) by mouth daily.  ? fluticasone (FLONASE) 50 MCG/ACT nasal spray Place 2 sprays into both nostrils daily. (Patient taking differently: Place 1 spray into both nostrils in the morning and at bedtime.)  ? fluticasone-salmeterol (ADVAIR) 500-50 MCG/ACT AEPB Inhale 1 puff into the lungs in the morning and at bedtime.  ? hydrochlorothiazide (HYDRODIURIL) 25 MG tablet Take 1 tablet (25 mg total) by mouth daily.  ? HYDROcodone-acetaminophen (NORCO/VICODIN) 5-325 MG tablet Take 1 tablet by mouth every 4 (four) hours as needed for moderate pain (pain score 4-6).  ? methocarbamol (ROBAXIN) 500 MG tablet Take 1 tablet (500 mg total) by mouth every 6 (six) hours as needed for muscle spasms.  ? montelukast (SINGULAIR) 10 MG tablet Take 1 tablet (10 mg total) by mouth at bedtime.  ? mupirocin ointment (BACTROBAN) 2 % Apply 1 application topically daily. With dressing changes  ? pantoprazole (PROTONIX) 20 MG tablet Take 1 tablet (20 mg total) by mouth 2 (two) times daily.  ? triamcinolone cream (KENALOG) 0.1 % Apply 1 application topically 2 (two) times daily. (Patient taking  differently: Apply 1 application. topically 2 (two) times daily as needed (irritation).)  ? vitamin B-12 (CYANOCOBALAMIN) 1000 MCG tablet Take 1,000 mcg by mouth daily.  ? ?No facility-administered encounter medications on file as of 03/06/2022.  ? ? ?Allergies (verified) ?Azithromycin, Lactose intolerance (gi), Prednisone, and Tape  ? ?History: ?Past Medical History:  ?Diagnosis Date  ? Allergy   ? Anemia   ? Arthritis   ? Asthma   ? Carpal tunnel syndrome   ? Chronic kidney disease   ? Dyspnea    ? Eczema   ? Globus pharyngeus   ? Hyperlipidemia   ? Hypertension   ? Laryngopharyngeal reflux   ? Menopause   ? Neuroma   ? right foot  ? Osteoporosis   ? Pneumonia   ? Raynaud's disease   ? Rhinitis due to pollen   ? SOB (shortness of breath)   ? ?Past Surgical History:  ?Procedure Laterality Date  ? COLONOSCOPY WITH PROPOFOL N/A 03/04/2019  ? Procedure: COLONOSCOPY WITH PROPOFOL;  Surgeon: [name], MD;  Location: [facility] ENDOSCOPY;  Service: Endoscopy;  Laterality: N/A;  ? EYE SURGERY Left 2015  ? broken blood veesel, Dr.Matthews   ? EYE SURGERY Bilateral 2017  ? In [place]   ? FOOT NEUROMA SURGERY Right   ? PARTIAL HYSTERECTOMY    ? SHOULDER ARTHROSCOPY WITH BICEPSTENOTOMY Right 04/08/2018  ? Procedure: SHOULDER ARTHROSCOPY WITH BICEPSTENOTOMY;  Surgeon: Lovell Sheehan, MD;  Location: [facility] ORS;  Service: Orthopedics;  Laterality: Right;  ? TOTAL SHOULDER ARTHROPLASTY Left 02/22/2022  ? Procedure: TOTAL SHOULDER ARTHROPLASTY;  Surgeon: Lovell Sheehan, MD;  Location: [facility] ORS;  Service: Orthopedics;  Laterality: Left;  ? ?Family History  ?Problem Relation Age of Onset  ? Arthritis Mother   ? Heart failure Mother   ? Hypertension Mother   ? Hyperlipidemia Mother   ? Heart disease Mother   ? Dementia Mother   ? Asthma Father   ? Aneurysm Sister   ? Lung cancer Son   ? Liver cancer Son   ? Alzheimer's disease Maternal Grandmother   ? ?Social History  ? ?Socioeconomic History  ? Marital status: Married  ?  Spouse name: Fritz Pickerel  ? Number of children: Not on file  ? Years of education: Not on file  ? Highest education level: Bachelor's degree (e.g., BA, AB, BS)  ?Occupational History  ? Occupation: retired  ?Tobacco Use  ? Smoking status: Former  ?  Packs/day: 2.00  ?  Years: 2.00  ?  Pack years: 4.00  ?  Types: Cigarettes  ?  Quit date: 12/18/1978  ?  Years since quitting: 43.2  ? Smokeless tobacco: Never  ? Tobacco comments:  ?  quit in 1980  ?Vaping Use  ? Vaping Use: Never used  ?Substance and Sexual Activity  ?  Alcohol use: Not Currently  ?  Alcohol/week: 0.0 standard drinks  ? Drug use: No  ? Sexual activity: Yes  ?Other Topics Concern  ? Not on file  ?Social History Narrative  ? Attends church  ? ?Social Determinants of Health  ? ?Financial Resource Strain: Low Risk   ? Difficulty of Paying Living Expenses: Not hard at all  ?Food Insecurity: No Food Insecurity  ? Worried About Charity fundraiser in the Last Year: Never true  ? Ran Out of Food in the Last Year: Never true  ?Transportation Needs: No Transportation Needs  ? Lack of Transportation (Medical): No  ? Lack of Transportation (Non-Medical): No  ?  Physical Activity: Inactive  ? Days of Exercise per Week: 0 days  ? Minutes of Exercise per Session: 0 min  ?Stress: No Stress Concern Present  ? Feeling of Stress : Not at all  ?Social Connections: Socially Integrated  ? Frequency of Communication with Friends and Family: More than three times a week  ? Frequency of Social Gatherings with Friends and Family: More than three times a week  ? Attends Religious Services: More than 4 times per year  ? Active Member of Clubs or Organizations: Yes  ? Attends Archivist Meetings: More than 4 times per year  ? Marital Status: Married  ? ? ?Tobacco Counseling ?Counseling given: Not Answered ?Tobacco comments: quit in 1980 ? ? ?Clinical Intake: ? ?Pre-visit preparation completed: Yes ? ?Pain : 0-10 ?Pain Score: 2  ?Pain Location: Shoulder ?Pain Orientation: Left ?Pain Onset: 1 to 4 weeks ago ?Pain Relieving Factors: hydrocodone,robaxin ? ?Pain Relieving Factors: hydrocodone,robaxin ? ?Nutritional Risks: None ?Diabetes: No ? ?How often do you need to have someone help you when you read instructions, pamphlets, or other written materials from your doctor or pharmacy?: 1 - Never ? ?Diabetic no ? ?Interpreter Needed?: No ? ?Information entered by :: Leroy Kennedy LPN ? ? ?Activities of Daily Living ?In your present state of health, do you have any difficulty performing the  following activities: 03/06/2022 02/22/2022  ?Hearing? N N  ?Vision? N N  ?Difficulty concentrating or making decisions? - N  ?Walking or climbing stairs? N N  ?Comment - -  ?Dressing or bathing? N N  ?Doing

## 2022-03-10 DIAGNOSIS — M25512 Pain in left shoulder: Secondary | ICD-10-CM | POA: Diagnosis not present

## 2022-03-10 DIAGNOSIS — M25612 Stiffness of left shoulder, not elsewhere classified: Secondary | ICD-10-CM | POA: Diagnosis not present

## 2022-03-22 DIAGNOSIS — M25512 Pain in left shoulder: Secondary | ICD-10-CM | POA: Diagnosis not present

## 2022-03-22 DIAGNOSIS — M25612 Stiffness of left shoulder, not elsewhere classified: Secondary | ICD-10-CM | POA: Diagnosis not present

## 2022-03-24 ENCOUNTER — Other Ambulatory Visit: Payer: Self-pay | Admitting: Family Medicine

## 2022-03-24 DIAGNOSIS — M25512 Pain in left shoulder: Secondary | ICD-10-CM | POA: Diagnosis not present

## 2022-03-24 DIAGNOSIS — M25612 Stiffness of left shoulder, not elsewhere classified: Secondary | ICD-10-CM | POA: Diagnosis not present

## 2022-03-24 NOTE — Telephone Encounter (Signed)
Requested Prescriptions  ?Pending Prescriptions Disp Refills  ?? azelastine (ASTELIN) 0.1 % nasal spray [Pharmacy Med Name: AZELASTINE 0.1% (137 MCG) SPRY] 90 mL 0  ?  Sig: Place 1 spray 2 (two) times daily into both nostrils. Use in each nostril as directed  ?  ? Ear, Nose, and Throat: Nasal Preparations - Antiallergy Passed - 03/24/2022 11:11 AM  ?  ?  Passed - Valid encounter within last 12 months  ?  Recent Outpatient Visits   ?      ? 3 months ago Benign essential HTN  ? [place], Megan P, DO  ? 10 months ago COVID-19  ? [place], NP  ? 10 months ago Benign essential HTN  ? [place], DO  ? 1 year ago Mixed hyperlipidemia  ? [place], DO  ? 1 year ago Encounter for [employer] annual wellness exam  ? [place], DO  ?  ?  ?Future Appointments   ?        ? In 2 months [name], Barb Merino, DO [facility]  ?  ? ?  ?  ?  ? ? ?

## 2022-03-29 DIAGNOSIS — M25512 Pain in left shoulder: Secondary | ICD-10-CM | POA: Diagnosis not present

## 2022-03-29 DIAGNOSIS — M25612 Stiffness of left shoulder, not elsewhere classified: Secondary | ICD-10-CM | POA: Diagnosis not present

## 2022-03-31 DIAGNOSIS — M25612 Stiffness of left shoulder, not elsewhere classified: Secondary | ICD-10-CM | POA: Diagnosis not present

## 2022-03-31 DIAGNOSIS — M25512 Pain in left shoulder: Secondary | ICD-10-CM | POA: Diagnosis not present

## 2022-04-05 DIAGNOSIS — M25612 Stiffness of left shoulder, not elsewhere classified: Secondary | ICD-10-CM | POA: Diagnosis not present

## 2022-04-05 DIAGNOSIS — M25512 Pain in left shoulder: Secondary | ICD-10-CM | POA: Diagnosis not present

## 2022-04-07 DIAGNOSIS — M25512 Pain in left shoulder: Secondary | ICD-10-CM | POA: Diagnosis not present

## 2022-04-07 DIAGNOSIS — Z96612 Presence of left artificial shoulder joint: Secondary | ICD-10-CM | POA: Diagnosis not present

## 2022-04-07 DIAGNOSIS — M25612 Stiffness of left shoulder, not elsewhere classified: Secondary | ICD-10-CM | POA: Diagnosis not present

## 2022-04-12 DIAGNOSIS — M25612 Stiffness of left shoulder, not elsewhere classified: Secondary | ICD-10-CM | POA: Diagnosis not present

## 2022-04-12 DIAGNOSIS — M25512 Pain in left shoulder: Secondary | ICD-10-CM | POA: Diagnosis not present

## 2022-04-14 DIAGNOSIS — M25512 Pain in left shoulder: Secondary | ICD-10-CM | POA: Diagnosis not present

## 2022-04-14 DIAGNOSIS — M25612 Stiffness of left shoulder, not elsewhere classified: Secondary | ICD-10-CM | POA: Diagnosis not present

## 2022-04-19 DIAGNOSIS — M25612 Stiffness of left shoulder, not elsewhere classified: Secondary | ICD-10-CM | POA: Diagnosis not present

## 2022-04-19 DIAGNOSIS — M25512 Pain in left shoulder: Secondary | ICD-10-CM | POA: Diagnosis not present

## 2022-04-21 DIAGNOSIS — M25512 Pain in left shoulder: Secondary | ICD-10-CM | POA: Diagnosis not present

## 2022-04-21 DIAGNOSIS — M25612 Stiffness of left shoulder, not elsewhere classified: Secondary | ICD-10-CM | POA: Diagnosis not present

## 2022-04-25 DIAGNOSIS — M545 Low back pain, unspecified: Secondary | ICD-10-CM | POA: Diagnosis not present

## 2022-04-28 DIAGNOSIS — M25612 Stiffness of left shoulder, not elsewhere classified: Secondary | ICD-10-CM | POA: Diagnosis not present

## 2022-04-28 DIAGNOSIS — M25512 Pain in left shoulder: Secondary | ICD-10-CM | POA: Diagnosis not present

## 2022-05-03 DIAGNOSIS — M25612 Stiffness of left shoulder, not elsewhere classified: Secondary | ICD-10-CM | POA: Diagnosis not present

## 2022-05-03 DIAGNOSIS — M25512 Pain in left shoulder: Secondary | ICD-10-CM | POA: Diagnosis not present

## 2022-05-05 DIAGNOSIS — M25612 Stiffness of left shoulder, not elsewhere classified: Secondary | ICD-10-CM | POA: Diagnosis not present

## 2022-05-05 DIAGNOSIS — M25512 Pain in left shoulder: Secondary | ICD-10-CM | POA: Diagnosis not present

## 2022-05-10 DIAGNOSIS — M25512 Pain in left shoulder: Secondary | ICD-10-CM | POA: Diagnosis not present

## 2022-05-10 DIAGNOSIS — M25612 Stiffness of left shoulder, not elsewhere classified: Secondary | ICD-10-CM | POA: Diagnosis not present

## 2022-05-12 DIAGNOSIS — M25612 Stiffness of left shoulder, not elsewhere classified: Secondary | ICD-10-CM | POA: Diagnosis not present

## 2022-05-12 DIAGNOSIS — M25512 Pain in left shoulder: Secondary | ICD-10-CM | POA: Diagnosis not present

## 2022-05-17 DIAGNOSIS — M25612 Stiffness of left shoulder, not elsewhere classified: Secondary | ICD-10-CM | POA: Diagnosis not present

## 2022-05-17 DIAGNOSIS — M25512 Pain in left shoulder: Secondary | ICD-10-CM | POA: Diagnosis not present

## 2022-05-19 DIAGNOSIS — M25612 Stiffness of left shoulder, not elsewhere classified: Secondary | ICD-10-CM | POA: Diagnosis not present

## 2022-05-19 DIAGNOSIS — M25512 Pain in left shoulder: Secondary | ICD-10-CM | POA: Diagnosis not present

## 2022-06-12 DIAGNOSIS — M25512 Pain in left shoulder: Secondary | ICD-10-CM | POA: Diagnosis not present

## 2022-06-12 DIAGNOSIS — M25612 Stiffness of left shoulder, not elsewhere classified: Secondary | ICD-10-CM | POA: Diagnosis not present

## 2022-06-13 ENCOUNTER — Ambulatory Visit (INDEPENDENT_AMBULATORY_CARE_PROVIDER_SITE_OTHER): Payer: Medicare Other | Admitting: Family Medicine

## 2022-06-13 ENCOUNTER — Encounter: Payer: Self-pay | Admitting: Family Medicine

## 2022-06-13 VITALS — BP 127/84 | HR 91 | Temp 98.0°F | Wt 156.8 lb

## 2022-06-13 DIAGNOSIS — E782 Mixed hyperlipidemia: Secondary | ICD-10-CM

## 2022-06-13 DIAGNOSIS — E559 Vitamin D deficiency, unspecified: Secondary | ICD-10-CM | POA: Diagnosis not present

## 2022-06-13 DIAGNOSIS — J45909 Unspecified asthma, uncomplicated: Secondary | ICD-10-CM | POA: Diagnosis not present

## 2022-06-13 DIAGNOSIS — N1831 Chronic kidney disease, stage 3a: Secondary | ICD-10-CM

## 2022-06-13 DIAGNOSIS — I10 Essential (primary) hypertension: Secondary | ICD-10-CM | POA: Diagnosis not present

## 2022-06-13 DIAGNOSIS — I700 Atherosclerosis of aorta: Secondary | ICD-10-CM | POA: Diagnosis not present

## 2022-06-13 DIAGNOSIS — J849 Interstitial pulmonary disease, unspecified: Secondary | ICD-10-CM | POA: Diagnosis not present

## 2022-06-13 LAB — MICROALBUMIN, URINE WAIVED
Creatinine, Urine Waived: 200 mg/dL (ref 10–300)
Microalb, Ur Waived: 10 mg/L (ref 0–19)
Microalb/Creat Ratio: <30 mg/g (ref <30)

## 2022-06-13 MED ORDER — MONTELUKAST SODIUM 10 MG PO TABS: 10.0000 mg | ORAL_TABLET | Freq: Every evening | ORAL | 1 refills | Status: DC

## 2022-06-13 MED ORDER — ESTRADIOL 0.5 MG PO TABS: 0.5000 mg | ORAL_TABLET | Freq: Every day | ORAL | 1 refills | Status: DC

## 2022-06-13 MED ORDER — ATORVASTATIN CALCIUM 40 MG PO TABS: 40.0000 mg | ORAL_TABLET | Freq: Every day | ORAL | 1 refills | Status: DC

## 2022-06-13 MED ORDER — ASPIRIN 325 MG PO TBEC: 325.0000 mg | DELAYED_RELEASE_TABLET | Freq: Every day | ORAL | 3 refills | Status: DC

## 2022-06-13 NOTE — Assessment & Plan Note (Signed)
Rechecking labs today. Await results. Treat as needed.  °

## 2022-06-13 NOTE — Assessment & Plan Note (Signed)
Will keep BP and cholesterol under good control. Call with any concerns. Continue to monitor. Call with any concerns.

## 2022-06-14 LAB — CBC WITH DIFFERENTIAL/PLATELET
Basophils Absolute: 0 10*3/uL (ref 0.0–0.2)
Basos: 1 %
EOS (ABSOLUTE): 0.4 10*3/uL (ref 0.0–0.4)
Eos: 8 %
Hematocrit: 37.3 % (ref 34.0–46.6)
Hemoglobin: 12.3 g/dL (ref 11.1–15.9)
Immature Grans (Abs): 0 10*3/uL (ref 0.0–0.1)
Immature Granulocytes: 0 %
Lymphocytes Absolute: 1.6 10*3/uL (ref 0.7–3.1)
Lymphs: 29 %
MCH: 26.3 pg — ABNORMAL LOW (ref 26.6–33.0)
MCHC: 33 g/dL (ref 31.5–35.7)
MCV: 80 fL (ref 79–97)
Monocytes Absolute: 0.5 10*3/uL (ref 0.1–0.9)
Monocytes: 8 %
Neutrophils Absolute: 2.9 10*3/uL (ref 1.4–7.0)
Neutrophils: 54 %
Platelets: 336 10*3/uL (ref 150–450)
RBC: 4.68 x10E6/uL (ref 3.77–5.28)
RDW: 14.9 % (ref 11.7–15.4)
WBC: 5.4 10*3/uL (ref 3.4–10.8)

## 2022-06-14 LAB — COMPREHENSIVE METABOLIC PANEL
ALT: 16 IU/L (ref 0–32)
AST: 20 IU/L (ref 0–40)
Albumin/Globulin Ratio: 1.7 (ref 1.2–2.2)
Albumin: 4.1 g/dL (ref 3.7–4.7)
Alkaline Phosphatase: 99 IU/L (ref 44–121)
BUN/Creatinine Ratio: 8 — ABNORMAL LOW (ref 12–28)
BUN: 8 mg/dL (ref 8–27)
Bilirubin Total: <0.2 mg/dL (ref 0.0–1.2)
CO2: 22 mmol/L (ref 20–29)
Calcium: 9.1 mg/dL (ref 8.7–10.3)
Chloride: 104 mmol/L (ref 96–106)
Creatinine, Ser: 1.06 mg/dL — ABNORMAL HIGH (ref 0.57–1.00)
Globulin, Total: 2.4 g/dL (ref 1.5–4.5)
Glucose: 103 mg/dL — ABNORMAL HIGH (ref 70–99)
Potassium: 4.1 mmol/L (ref 3.5–5.2)
Sodium: 141 mmol/L (ref 134–144)
Total Protein: 6.5 g/dL (ref 6.0–8.5)
eGFR: 55 mL/min/{1.73_m2} — ABNORMAL LOW (ref >59)

## 2022-06-14 LAB — LIPID PANEL W/O CHOL/HDL RATIO
Cholesterol, Total: 201 mg/dL — ABNORMAL HIGH (ref 100–199)
HDL: 63 mg/dL (ref >39)
LDL Chol Calc (NIH): 110 mg/dL — ABNORMAL HIGH (ref 0–99)
Triglycerides: 163 mg/dL — ABNORMAL HIGH (ref 0–149)
VLDL Cholesterol Cal: 28 mg/dL (ref 5–40)

## 2022-06-14 LAB — VITAMIN D 25 HYDROXY (VIT D DEFICIENCY, FRACTURES): Vit D, 25-Hydroxy: 33.6 ng/mL (ref 30.0–100.0)

## 2022-06-16 DIAGNOSIS — M25612 Stiffness of left shoulder, not elsewhere classified: Secondary | ICD-10-CM | POA: Diagnosis not present

## 2022-06-16 DIAGNOSIS — M25512 Pain in left shoulder: Secondary | ICD-10-CM | POA: Diagnosis not present

## 2022-06-18 IMAGING — CT CT ANGIO CHEST
1 of 2 series · 18 of 32 positions shown · IV contrast (APPLIED)
Comparison: Chest CT November 24, 2013

CLINICAL DATA: Shortness of breath and cough

EXAM:
CT ANGIOGRAPHY CHEST WITH CONTRAST
TECHNIQUE: Multidetector CT imaging of the chest was performed using the
standard protocol during bolus administration of intravenous
contrast. Multiplanar CT image reconstructions and MIPs were
obtained to evaluate the vascular anatomy.
CONTRAST:  75mL OMNIPAQUE IOHEXOL 350 MG/ML SOLN

[Series 10: thins · axial · 0.59mm/px · z∈[-569,-348]mm · 18 of 247 slices shown]
[im 13/247  lung]
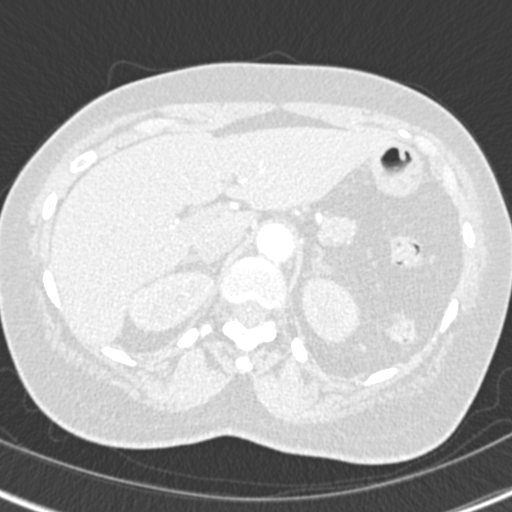
[im 25/247  mediastinal]
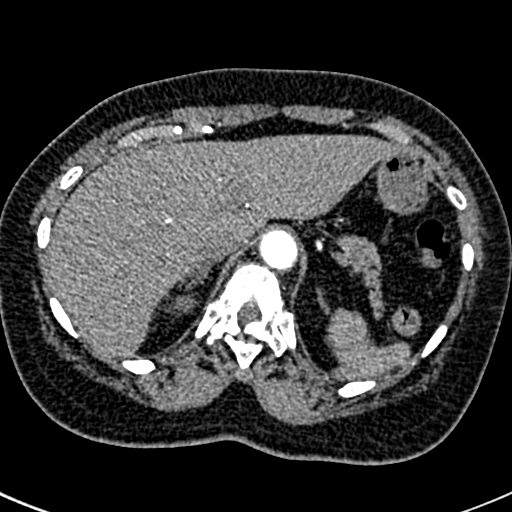
[im 50/247  lung]
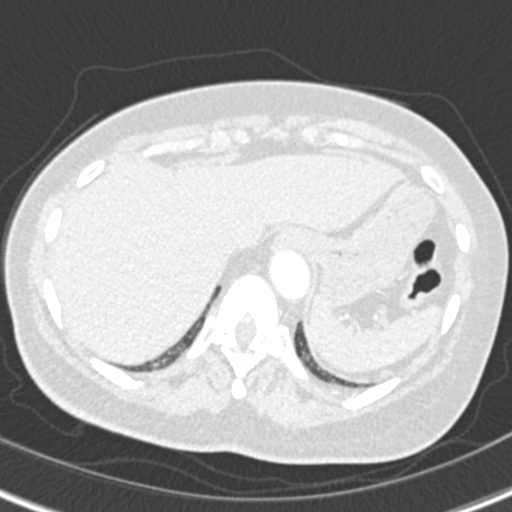
[im 62/247  mediastinal]
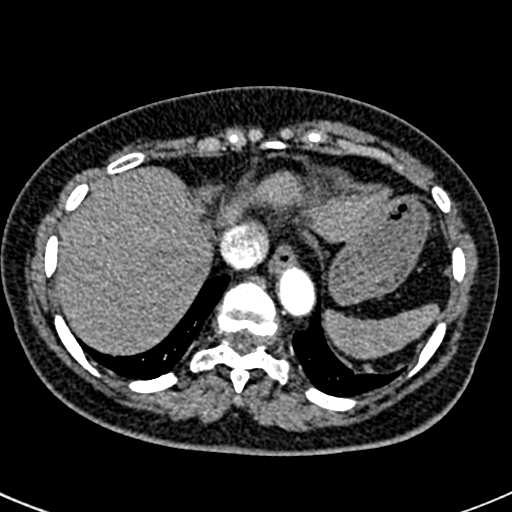
[im 74/247  lung]
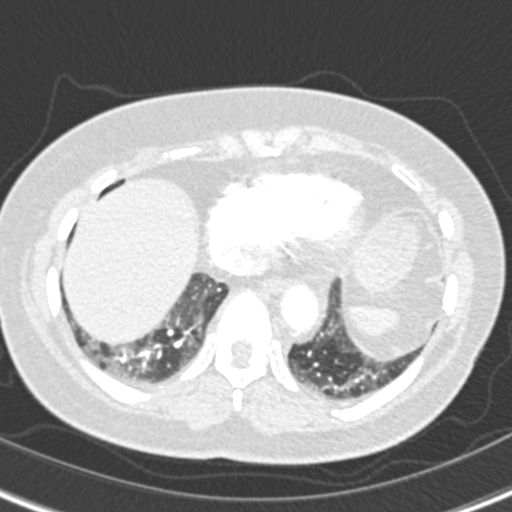
[im 83/247  mediastinal]
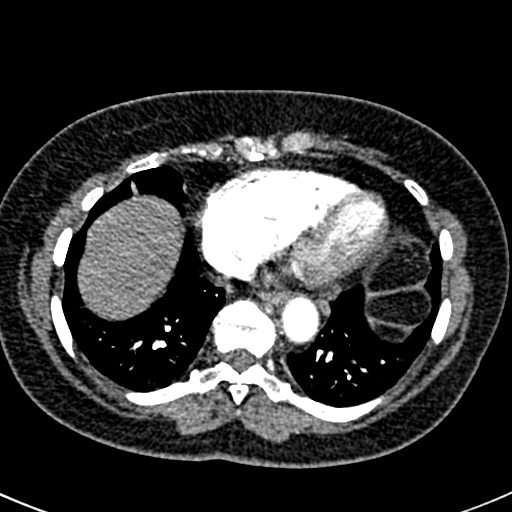
[im 87/247  lung]
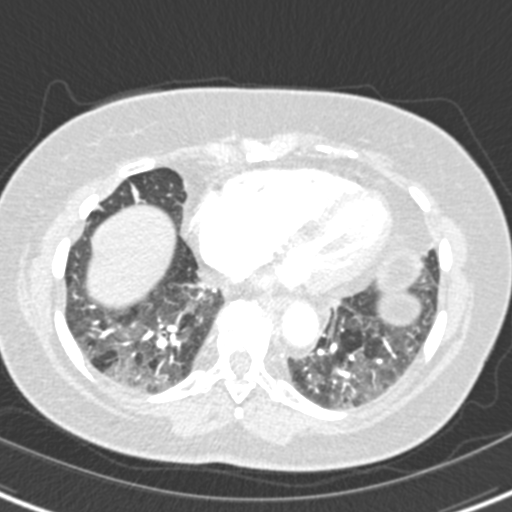
[im 111/247  mediastinal]
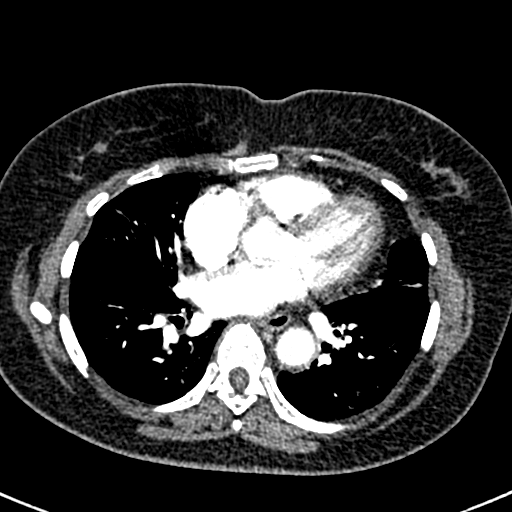
[im 114/247  lung]
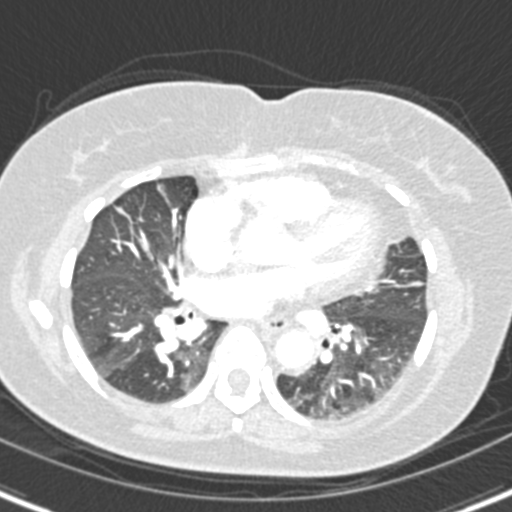
[im 124/247  mediastinal]
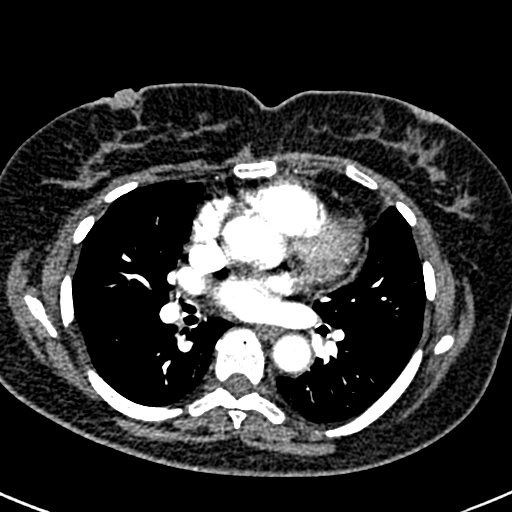
[im 136/247  lung]
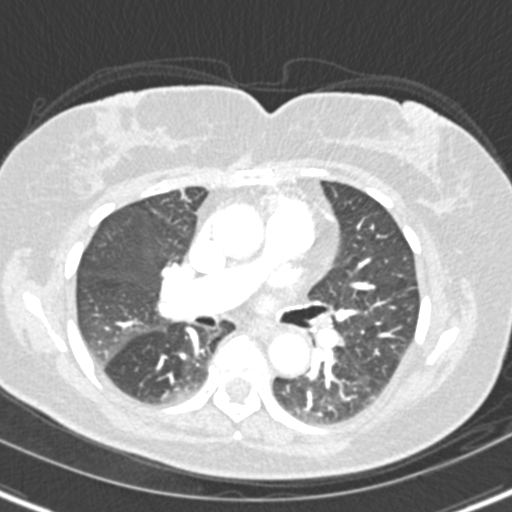
[im 160/247  mediastinal]
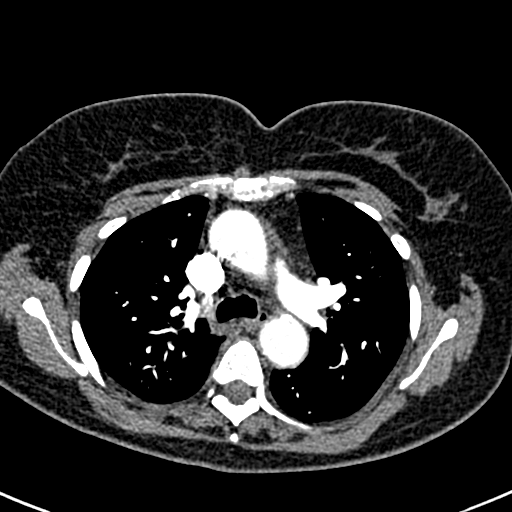
[im 165/247  lung]
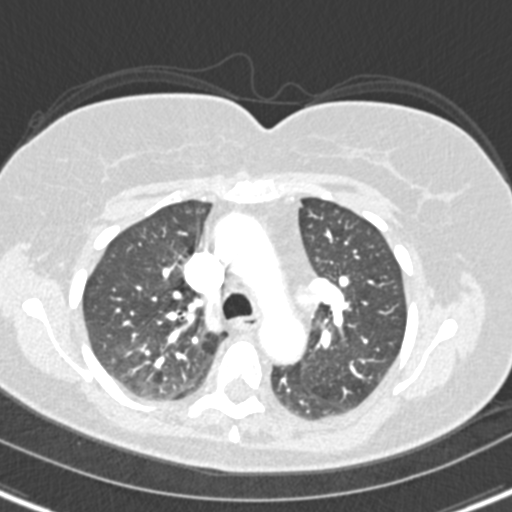
[im 173/247  mediastinal]
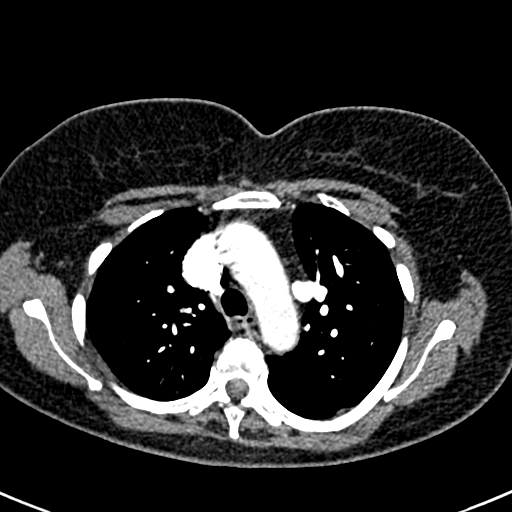
[im 185/247  lung]
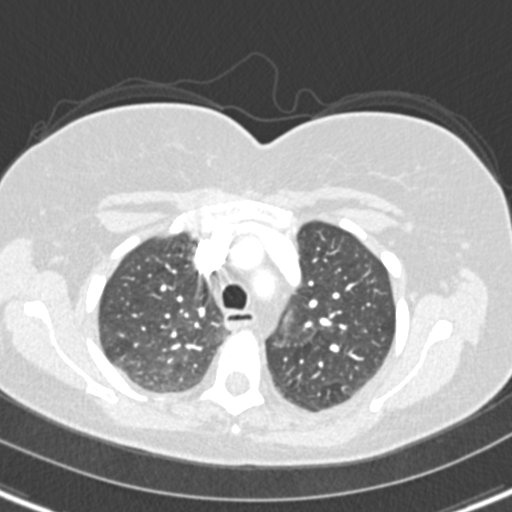
[im 197/247  mediastinal]
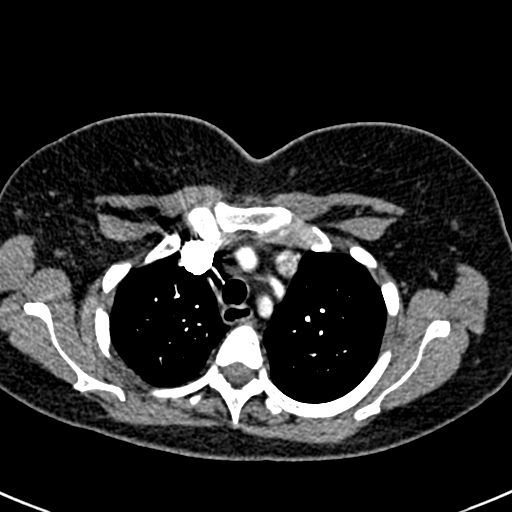
[im 222/247  lung]
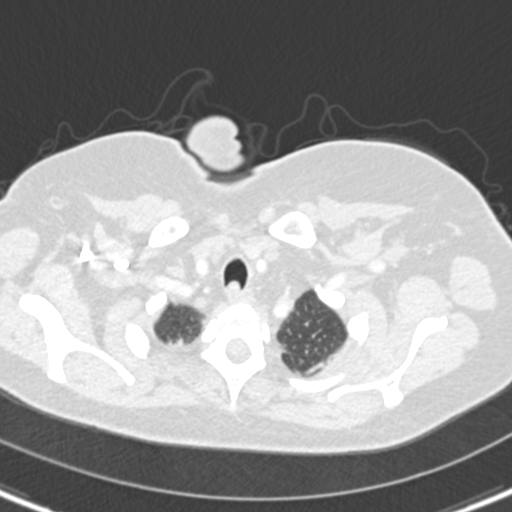
[im 234/247  mediastinal]
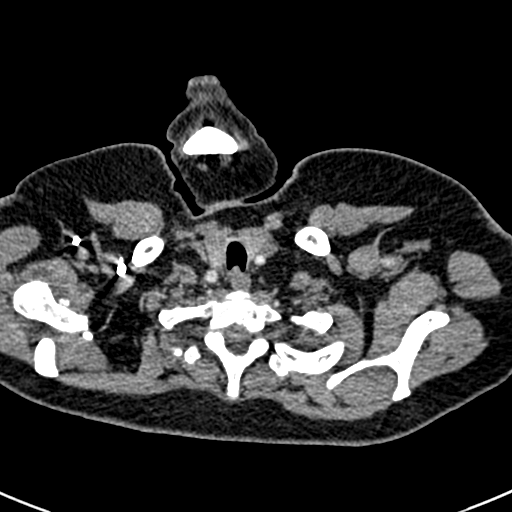

[18 of 32 positions shown; findings below may reference images not displayed]

FINDINGS: Cardiovascular: There is no demonstrable pulmonary embolus. There is
no thoracic aortic aneurysm or dissection. The visualized great
vessels appear unremarkable. There is mild aortic atherosclerosis.
There is no appreciable pericardial effusion or pericardial
thickening.

Mediastinum/Nodes: Visualized thyroid appears normal. There is no
appreciable thoracic adenopathy. No esophageal lesions are evident.

Lungs/Pleura: There is mild scarring in the lung bases. There is a 3
mm nodular opacity in the posterior segment right upper lobe seen on
axial slice 20 series 5, stable. A 2 mm nodular opacity in the
anterior segment of the left upper lobe seen on axial slice 24
series 5 is stable. A 2 mm nodular opacity in the posterior segment
left upper lobe seen on axial slice 26 series 5 is stable. There is
ground-glass type opacity in the lower lobe regions bilaterally and
to a lesser extent in the posterior segment of the right upper lobe.
No consolidation. No pleural effusions.

Upper Abdomen: Visualized upper abdominal structures appear
unremarkable.

Musculoskeletal: There is degenerative change in the thoracic spine.
No appreciable blastic or lytic bone lesions. No chest wall lesions
evident.

Review of the MIP images confirms the above findings.
IMPRESSION: 1. No demonstrable pulmonary embolus. No thoracic aortic aneurysm or
dissection. There is mild aortic atherosclerosis.

2. Stable 2-3 mm nodular opacities, stable from prior study. Areas
of mild scarring in the lung bases. There is ground-glass type
opacity at multiple sites in the lower lobes and to a lesser extent
in the posterior segment right upper lobe. This appearance may be
seen with small airways obstructive disease. It also may be seen
potentially with atypical organism pneumonia. Advise check of
[id number] status in this regard. No evident consolidation.

3.  No adenopathy appreciable.

Aortic Atherosclerosis (0XYAO-U3D.D).

These results will be called to the ordering clinician or
representative by the Radiologist Assistant, and communication
documented in the PACS or [REDACTED].

## 2022-06-19 DIAGNOSIS — M25612 Stiffness of left shoulder, not elsewhere classified: Secondary | ICD-10-CM | POA: Diagnosis not present

## 2022-06-19 DIAGNOSIS — M25512 Pain in left shoulder: Secondary | ICD-10-CM | POA: Diagnosis not present

## 2022-06-23 DIAGNOSIS — M25612 Stiffness of left shoulder, not elsewhere classified: Secondary | ICD-10-CM | POA: Diagnosis not present

## 2022-06-23 DIAGNOSIS — Z96612 Presence of left artificial shoulder joint: Secondary | ICD-10-CM | POA: Diagnosis not present

## 2022-06-23 DIAGNOSIS — M25512 Pain in left shoulder: Secondary | ICD-10-CM | POA: Diagnosis not present

## 2022-06-26 DIAGNOSIS — M25612 Stiffness of left shoulder, not elsewhere classified: Secondary | ICD-10-CM | POA: Diagnosis not present

## 2022-06-26 DIAGNOSIS — M25512 Pain in left shoulder: Secondary | ICD-10-CM | POA: Diagnosis not present

## 2022-06-30 DIAGNOSIS — M25612 Stiffness of left shoulder, not elsewhere classified: Secondary | ICD-10-CM | POA: Diagnosis not present

## 2022-06-30 DIAGNOSIS — M25512 Pain in left shoulder: Secondary | ICD-10-CM | POA: Diagnosis not present

## 2022-07-03 DIAGNOSIS — M25612 Stiffness of left shoulder, not elsewhere classified: Secondary | ICD-10-CM | POA: Diagnosis not present

## 2022-07-03 DIAGNOSIS — M25512 Pain in left shoulder: Secondary | ICD-10-CM | POA: Diagnosis not present

## 2022-07-10 DIAGNOSIS — M25612 Stiffness of left shoulder, not elsewhere classified: Secondary | ICD-10-CM | POA: Diagnosis not present

## 2022-07-10 DIAGNOSIS — M25512 Pain in left shoulder: Secondary | ICD-10-CM | POA: Diagnosis not present

## 2022-07-14 DIAGNOSIS — M25612 Stiffness of left shoulder, not elsewhere classified: Secondary | ICD-10-CM | POA: Diagnosis not present

## 2022-07-14 DIAGNOSIS — M25512 Pain in left shoulder: Secondary | ICD-10-CM | POA: Diagnosis not present

## 2022-07-17 DIAGNOSIS — M25512 Pain in left shoulder: Secondary | ICD-10-CM | POA: Diagnosis not present

## 2022-07-17 DIAGNOSIS — M25612 Stiffness of left shoulder, not elsewhere classified: Secondary | ICD-10-CM | POA: Diagnosis not present

## 2022-07-26 DIAGNOSIS — M25612 Stiffness of left shoulder, not elsewhere classified: Secondary | ICD-10-CM | POA: Diagnosis not present

## 2022-07-26 DIAGNOSIS — M25512 Pain in left shoulder: Secondary | ICD-10-CM | POA: Diagnosis not present

## 2022-08-04 DIAGNOSIS — M25512 Pain in left shoulder: Secondary | ICD-10-CM | POA: Diagnosis not present

## 2022-08-04 DIAGNOSIS — M25612 Stiffness of left shoulder, not elsewhere classified: Secondary | ICD-10-CM | POA: Diagnosis not present

## 2022-08-11 DIAGNOSIS — M25612 Stiffness of left shoulder, not elsewhere classified: Secondary | ICD-10-CM | POA: Diagnosis not present

## 2022-08-11 DIAGNOSIS — M25512 Pain in left shoulder: Secondary | ICD-10-CM | POA: Diagnosis not present

## 2022-09-05 ENCOUNTER — Telehealth: Payer: Self-pay | Admitting: Family Medicine

## 2022-09-05 DIAGNOSIS — Z1231 Encounter for screening mammogram for malignant neoplasm of breast: Secondary | ICD-10-CM

## 2022-09-05 NOTE — Telephone Encounter (Signed)
Pt stated she wanted to let Dr.Johnson know that she has finished using medication Advair. Pt mentioned Dr.Johnson is going to switch her to a different asthma medication, and she wants to let her know she is finished with Advair and ready for the new medication.   Pt requesting an order be sent to [facility] for a bone density and mammogram.  Please advise.

## 2022-09-06 ENCOUNTER — Other Ambulatory Visit: Payer: Self-pay | Admitting: Family Medicine

## 2022-09-06 MED ORDER — TRELEGY ELLIPTA 100-62.5-25 MCG/ACT IN AEPB: 1.0000 | INHALATION_SPRAY | Freq: Every day | RESPIRATORY_TRACT | 11 refills | Status: DC

## 2022-09-06 NOTE — Telephone Encounter (Signed)
Orders in. OK to send to [facility]

## 2022-09-06 NOTE — Telephone Encounter (Signed)
Attempted to contact patient NA LVM making patient aware medication has been sent to pharmacy and order are placed for Mammo and DEXA scan.

## 2022-09-18 ENCOUNTER — Ambulatory Visit (INDEPENDENT_AMBULATORY_CARE_PROVIDER_SITE_OTHER): Payer: Medicare Other

## 2022-09-18 DIAGNOSIS — Z23 Encounter for immunization: Secondary | ICD-10-CM | POA: Diagnosis not present

## 2022-09-20 ENCOUNTER — Telehealth: Payer: Self-pay | Admitting: Family Medicine

## 2022-09-20 NOTE — Telephone Encounter (Signed)
Copied from [place]. Topic: General - Other >> Sep 20, 2022 11:38 AM [place] B wrote: Reason for JFT:[id number] from [facility] called, needs orders out in for a Bone density, test. Wants order faxed to  [phone].

## 2022-09-21 DIAGNOSIS — Z780 Asymptomatic menopausal state: Secondary | ICD-10-CM | POA: Diagnosis not present

## 2022-09-21 DIAGNOSIS — Z1231 Encounter for screening mammogram for malignant neoplasm of breast: Secondary | ICD-10-CM | POA: Diagnosis not present

## 2022-09-21 LAB — HM MAMMOGRAPHY

## 2022-09-21 LAB — HM DEXA SCAN

## 2022-09-21 NOTE — Telephone Encounter (Signed)
Copied from [place] [phone]. Topic: General - Other >> Sep 20, 2022  4:03 PM Everette C wrote: Reason for CRM: Tamia with [facility] has called to share that the time of signature is needed for Dr. Durenda Age orders submitted for the patient's bone density test   The orders were submitted to [facility] on 09/14/22  Please contact [facility] further when possible

## 2022-09-21 NOTE — Telephone Encounter (Signed)
Attempted to return call to patient to notify order has be resent. NA unable to LVM.

## 2022-09-21 NOTE — Telephone Encounter (Signed)
Pt called in and stated she is currently at [facility] for her bone density, and they do not have her order. Pt is very upset because she is there now. Pt is requesting a call back as soon as possible. Pt mentioned she was going to stay there and wait for a while to see if she heard back.   Fax [phone]  Please advise.

## 2022-09-28 ENCOUNTER — Other Ambulatory Visit: Payer: Self-pay | Admitting: Family Medicine

## 2022-09-28 NOTE — Telephone Encounter (Signed)
Requested Prescriptions  Pending Prescriptions Disp Refills  . fexofenadine (ALLEGRA) 180 MG tablet [Pharmacy Med Name: FEXOFENADINE HCL 180 MG TABLET] 90 tablet 0    Sig: Take 1 tablet (180 mg total) by mouth daily.     Ear, Nose, and Throat:  Antihistamines Passed - 09/28/2022 10:04 AM      Passed - Valid encounter within last 12 months    Recent Outpatient Visits          3 months ago Mixed hyperlipidemia   [facility] [place], [place], DO   9 months ago Benign essential HTN   [place], [place], DO   1 year ago [place], Karen, NP   1 year ago Benign essential HTN   [place], [place], DO   1 year ago Mixed hyperlipidemia   [facility] [place], [place], DO

## 2022-10-06 ENCOUNTER — Telehealth (INDEPENDENT_AMBULATORY_CARE_PROVIDER_SITE_OTHER): Payer: Medicare Other | Admitting: Family Medicine

## 2022-10-06 ENCOUNTER — Encounter: Payer: Self-pay | Admitting: Family Medicine

## 2022-10-06 DIAGNOSIS — R0982 Postnasal drip: Secondary | ICD-10-CM | POA: Diagnosis not present

## 2022-10-06 MED ORDER — METHYLPREDNISOLONE 4 MG PO TBPK: ORAL_TABLET | ORAL | 0 refills | Status: DC

## 2022-10-06 NOTE — Progress Notes (Signed)
LMP 07/31/1981 (Approximate)    Subjective:    Patient ID: Nancy Mack, female    DOB: [birth year], 76 y.o.   MRN: [id number]  HPI: Nancy Mack is a 76 y.o. female  Chief Complaint  Patient presents with   URI    Patient states she has drainage in the back of her throat for 2 weeks, causing her to cough. Patient states she has been having to use albuterol inhaler.    UPPER RESPIRATORY TRACT INFECTION Duration: 2 weeks Worst symptom: post nasal drip Fever: no Cough: yes Shortness of breath: no Wheezing: no Chest pain: no Chest tightness: no Chest congestion: no Nasal congestion: yes Runny nose: yes Post nasal drip: yes Sneezing: no Sore throat: no Swollen glands: no Sinus pressure: no Headache: no Face pain: no Toothache: no Ear pain: no  Ear pressure: no  Eyes red/itching:no Eye drainage/crusting: no  Vomiting: no Rash: no Fatigue: no Sick contacts: no Strep contacts: no  Context: stable Recurrent sinusitis: no Relief with OTC cold/cough medications: no  Treatments attempted: inhaler, flonase, ginger tea, allegra, singluar   Relevant past medical, surgical, family and social history reviewed and updated as indicated. Interim medical history since our last visit reviewed. Allergies and medications reviewed and updated.  Review of Systems  Constitutional: Negative.   HENT:  Positive for postnasal drip, rhinorrhea and sore throat. Negative for congestion, dental problem, drooling, ear discharge, ear pain, facial swelling, hearing loss, mouth sores, nosebleeds, sinus pressure, sinus pain, sneezing, tinnitus, trouble swallowing and voice change.   Respiratory: Negative.    Cardiovascular: Negative.   Psychiatric/Behavioral: Negative.      Per HPI unless specifically indicated above     Objective:    LMP 07/31/1981 (Approximate)   Wt Readings from Last 3 Encounters:  06/13/22 156 lb 12.8 oz (71.1 kg)  02/22/22 155 lb 3.3 oz (70.4 kg)   02/10/22 155 lb 3.3 oz (70.4 kg)    Physical Exam Vitals and nursing note reviewed.  Constitutional:      General: She is not in acute distress.    Appearance: Normal appearance. She is not ill-appearing, toxic-appearing or diaphoretic.  HENT:     Head: Normocephalic and atraumatic.     Right Ear: External ear normal.     Left Ear: External ear normal.     Nose: Nose normal.     Mouth/Throat:     Mouth: Mucous membranes are moist.     Pharynx: Oropharynx is clear.  Eyes:     General: No scleral icterus.       Right eye: No discharge.        Left eye: No discharge.     Conjunctiva/sclera: Conjunctivae normal.     Pupils: Pupils are equal, round, and reactive to light.  Pulmonary:     Effort: Pulmonary effort is normal. No respiratory distress.     Comments: Speaking in full sentences Musculoskeletal:        General: Normal range of motion.     Cervical back: Normal range of motion.  Skin:    Coloration: Skin is not jaundiced or pale.     Findings: No bruising, erythema, lesion or rash.  Neurological:     Mental Status: She is alert and oriented to person, place, and time. Mental status is at baseline.  Psychiatric:        Mood and Affect: Mood normal.        Behavior: Behavior normal.  Thought Content: Thought content normal.        Judgment: Judgment normal.     Results for orders placed or performed in visit on 09/22/22  HM DEXA SCAN  Result Value Ref Range   HM Dexa Scan see result scanned into chart       Assessment & Plan:   Problem List Items Addressed This Visit   None Visit Diagnoses     Post-nasal drip    -  Primary   Already on allegra, singulair and flonase. Will treat with medrol dosepak. Call if not getting better or getting worse.         Follow up plan: Return if symptoms worsen or fail to improve.   This visit was completed via video visit through MyChart due to the restrictions of the COVID-19 pandemic. All issues as above were  discussed and addressed. Physical exam was done as above through visual confirmation on video through MyChart. If it was felt that the patient should be evaluated in the office, they were directed there. The patient verbally consented to this visit. Location of the patient: home Location of the provider: work Those involved with this call:  Provider: Park Liter, DO CMA: Louanna Raw, [place] Desk/Registration: [employer]  Time spent on call:  15 minutes with patient face to face via video conference. More than 50% of this time was spent in counseling and coordination of care. 23 minutes total spent in review of patient's record and preparation of their chart.

## 2022-10-09 ENCOUNTER — Ambulatory Visit: Payer: Self-pay | Admitting: *Deleted

## 2022-10-09 NOTE — Telephone Encounter (Signed)
Was sent methylprednisolone- not prednisone. OK to stop if not tolerating.

## 2022-10-09 NOTE — Telephone Encounter (Signed)
She can take over the counter medications such as mucinex or sudafed. Unfortunately there is not much else we can give her prescription wise.

## 2022-10-09 NOTE — Telephone Encounter (Signed)
Pt states she is allergic to prednisone but Dr [name] was having her try this pack. It is makiing HR and BP elevated.  Chief Complaint: HR and BP elevated Symptoms: feels bad Frequency: took 7 tabs Sat and 5 tabs Sun Pertinent Negatives: Patient denies SOB Disposition: '[]'$ ED /'[]'$ Urgent Care (no appt availability in office) / '[]'$ Appointment(In office/virtual)/ '[]'$  [place] [facility] [facility]/ '[]'$ Home Care/ '[]'$ Refused Recommended Disposition /'[]'$ [place] [facility]/ '[x]'$  Follow-up with PCP Additional Notes: Pt wants to stop prednisone, needs to know if has to wean off, took 7 Sat and 5 Sun.   Reason for Disposition  [1] Caller requesting NON-URGENT health information AND [2] PCP's office is the best resource  Answer Assessment - Initial Assessment Questions 1. REASON FOR CALL or QUESTION: "What is your reason for calling today?" or "How can I best help you?" or "What question do you have that I can help answer?"     Wants to stop prednisone, it is making heart rate increase.  Protocols used: Information Only Call - No Triage-A-AH

## 2022-10-09 NOTE — Telephone Encounter (Signed)
Patient is aware, no further questions.

## 2022-10-09 NOTE — Telephone Encounter (Signed)
Patient is aware of provider advise, states she still has a lot of mucus and would like to know how to get rid of it. Patient is taking allegra, flonase, and singulair.

## 2022-10-25 DIAGNOSIS — H40009 Preglaucoma, unspecified, unspecified eye: Secondary | ICD-10-CM | POA: Diagnosis not present

## 2022-12-27 ENCOUNTER — Other Ambulatory Visit: Payer: Self-pay | Admitting: Family Medicine

## 2022-12-27 NOTE — Telephone Encounter (Signed)
Requested Prescriptions  Pending Prescriptions Disp Refills   atorvastatin (LIPITOR) 40 MG tablet [Pharmacy Med Name: ATORVASTATIN 40 MG TABLET] 90 tablet 0    Sig: Take 1 tablet (40 mg total) by mouth daily.     Cardiovascular:  Antilipid - Statins Failed - 12/27/2022 10:49 AM      Failed - Lipid Panel in normal range within the last 12 months    Cholesterol, Total  Date Value Ref Range Status  06/13/2022 201 (H) 100 - 199 mg/dL Final   LDL Chol Calc (NIH)  Date Value Ref Range Status  06/13/2022 110 (H) 0 - 99 mg/dL Final   HDL  Date Value Ref Range Status  06/13/2022 63 >39 mg/dL Final   Triglycerides  Date Value Ref Range Status  06/13/2022 163 (H) 0 - 149 mg/dL Final         Passed - Patient is not pregnant      Passed - Valid encounter within last 12 months    Recent Outpatient Visits           2 months ago Post-nasal drip   [place], [place], DO   6 months ago Mixed hyperlipidemia   [employer], [place], DO   1 year ago Benign essential HTN   [place], [place], DO   1 year ago [place], NP   1 year ago Benign essential HTN   [facility] Johnson, Megan P, DO               montelukast (SINGULAIR) 10 MG tablet [Pharmacy Med Name: MONTELUKAST SOD 10 MG TABLET] 90 tablet 0    Sig: Take 1 tablet (10 mg total) by mouth at bedtime.     Pulmonology:  Leukotriene Inhibitors Passed - 12/27/2022 10:49 AM      Passed - Valid encounter within last 12 months    Recent Outpatient Visits           2 months ago Post-nasal drip   [place], [place], DO   6 months ago Mixed hyperlipidemia   [place], DO   1 year ago Benign essential HTN   [place], [place], DO   1 year ago [place], NP   1 year ago Benign essential HTN    [place], [place], DO

## 2022-12-29 ENCOUNTER — Ambulatory Visit: Payer: Self-pay

## 2022-12-29 NOTE — Telephone Encounter (Signed)
  Chief Complaint: was having frequency - resolved Symptoms: mostly resolved Frequency: Since starting Trelegy Pertinent Negatives: Patient denies Back pain , fever, pain Disposition: '[]'$ ED /'[]'$ Urgent Care (no appt availability in office) / '[x]'$ Appointment(In office/virtual)/ '[]'$  [facility] [facility]/ '[]'$ Home Care/ '[]'$ Refused Recommended Disposition /'[]'$ [place] [facility]/ '[]'$  Follow-up with PCP Additional Notes: Pt states that when she started trillegy she started having frequency especially at night. She would void, return to be and have to get right back up to go again. Pt thinks that this was from the tTrelegy. Pt discontinued this medication at the end of December and symptoms have resolved.  PT also wants to know if there is a test to see if someone is vaping.   Summary: med side effects   Pt started Trillegy and is having complications / her bladder has been effected / please advise     Reason for Disposition  All other urine symptoms  Answer Assessment - Initial Assessment Questions 1. SYMPTOM: "What's the main symptom you're concerned about?" (e.g., frequency, incontinence)     Urgency 2. ONSET: "When did the  urgency  start?"     End of November - beginning of December 3. PAIN: "Is there any pain?" If Yes, ask: "How bad is it?" (Scale: 1-10; mild, moderate, severe)     no 4. CAUSE: "What do you think is causing the symptoms?"     Trelegy 5. OTHER SYMPTOMS: "Do you have any other symptoms?" (e.g., blood in urine, fever, flank pain, pain with urination)     no 6. PREGNANCY: "Is there any chance you are pregnant?" "When was your last menstrual period?"  Protocols used: Urinary Symptoms-A-AH

## 2023-01-02 ENCOUNTER — Encounter: Payer: Self-pay | Admitting: Family Medicine

## 2023-01-02 ENCOUNTER — Ambulatory Visit (INDEPENDENT_AMBULATORY_CARE_PROVIDER_SITE_OTHER): Payer: Medicare Other | Admitting: Family Medicine

## 2023-01-02 VITALS — BP 133/82 | HR 87 | Temp 98.0°F | Ht 62.5 in | Wt 149.6 lb

## 2023-01-02 DIAGNOSIS — I10 Essential (primary) hypertension: Secondary | ICD-10-CM

## 2023-01-02 DIAGNOSIS — I700 Atherosclerosis of aorta: Secondary | ICD-10-CM | POA: Diagnosis not present

## 2023-01-02 DIAGNOSIS — K219 Gastro-esophageal reflux disease without esophagitis: Secondary | ICD-10-CM

## 2023-01-02 DIAGNOSIS — N1831 Chronic kidney disease, stage 3a: Secondary | ICD-10-CM | POA: Diagnosis not present

## 2023-01-02 DIAGNOSIS — J849 Interstitial pulmonary disease, unspecified: Secondary | ICD-10-CM | POA: Diagnosis not present

## 2023-01-02 DIAGNOSIS — J45909 Unspecified asthma, uncomplicated: Secondary | ICD-10-CM | POA: Diagnosis not present

## 2023-01-02 DIAGNOSIS — Z780 Asymptomatic menopausal state: Secondary | ICD-10-CM | POA: Diagnosis not present

## 2023-01-02 DIAGNOSIS — E782 Mixed hyperlipidemia: Secondary | ICD-10-CM | POA: Diagnosis not present

## 2023-01-02 DIAGNOSIS — N8110 Cystocele, unspecified: Secondary | ICD-10-CM

## 2023-01-02 DIAGNOSIS — E559 Vitamin D deficiency, unspecified: Secondary | ICD-10-CM | POA: Diagnosis not present

## 2023-01-02 LAB — URINALYSIS, ROUTINE W REFLEX MICROSCOPIC
Bilirubin, UA: NEGATIVE
Glucose, UA: NEGATIVE
Ketones, UA: NEGATIVE
Leukocytes,UA: NEGATIVE
Nitrite, UA: NEGATIVE
Protein,UA: NEGATIVE
RBC, UA: NEGATIVE
Specific Gravity, UA: 1.015 (ref 1.005–1.030)
Urobilinogen, Ur: 0.2 mg/dL (ref 0.2–1.0)
pH, UA: 5.5 (ref 5.0–7.5)

## 2023-01-02 MED ORDER — FEXOFENADINE HCL 180 MG PO TABS: ORAL_TABLET | ORAL | 1 refills | Status: DC

## 2023-01-02 MED ORDER — ATORVASTATIN CALCIUM 40 MG PO TABS: 40.0000 mg | ORAL_TABLET | Freq: Every day | ORAL | 1 refills | Status: DC

## 2023-01-02 MED ORDER — MONTELUKAST SODIUM 10 MG PO TABS: 10.0000 mg | ORAL_TABLET | Freq: Every evening | ORAL | 1 refills | Status: DC

## 2023-01-02 NOTE — Assessment & Plan Note (Signed)
Under good control on current regimen. Continue current regimen. Continue to monitor. Call with any concerns. Refills given. Labs drawn today.   

## 2023-01-02 NOTE — Assessment & Plan Note (Signed)
Will get her into Pelvic floor PT. Call with any concerns.

## 2023-01-02 NOTE — Assessment & Plan Note (Signed)
Doing well off medicine. Continue to monitor. Call with any concerns.  

## 2023-01-02 NOTE — Assessment & Plan Note (Signed)
Rechecking labs today. Await results. Treat as needed.  °

## 2023-01-02 NOTE — Assessment & Plan Note (Signed)
Under good control on current regimen. Continue current regimen. Continue to monitor. Call with any concerns. Refills up to date. Encouraged her to take her medicine before the allergy season.

## 2023-01-02 NOTE — Assessment & Plan Note (Signed)
Will keep BP and cholesterol under good control. Continue to monitor.  

## 2023-01-02 NOTE — Assessment & Plan Note (Signed)
Has not been taking her estradiol. Doing OK. Will call if she needs a refill.

## 2023-01-02 NOTE — Assessment & Plan Note (Signed)
Stable. Call with any concerns.  

## 2023-01-02 NOTE — Progress Notes (Signed)
BP 133/82   Pulse 87   Temp 98 F (36.7 C) (Oral)   Ht 5' 2.5" (1.588 m)   Wt 149 lb 9.6 oz (67.9 kg)   LMP 07/31/1981 (Approximate)   SpO2 99%   BMI 26.93 kg/m    Subjective:    Patient ID: Nancy Mack, female    DOB: Sep 23, 1946, 77 y.o.   MRN: [id number]  HPI: Nancy Mack is a 77 y.o. female  Chief Complaint  Patient presents with   Hyperlipidemia   Urinary Tract Infection    Patient says she has been having issues of using the bathroom and emptying her bladder and feels as if it has not being all the way emptied. Patient says she read where this was a side effect of Trelegy medication and says she has since stopped.    HYPERTENSION / HYPERLIPIDEMIA Satisfied with current treatment? yes Duration of hypertension: chronic BP monitoring frequency: not checking BP medication side effects: no Duration of hyperlipidemia: chronic Cholesterol medication side effects: no Cholesterol supplements: none Past cholesterol medications: atorvastatin Medication compliance: excellent compliance Aspirin: yes Recent stressors: yes Recurrent headaches: no Visual changes: no Palpitations: no Dyspnea: no Chest pain: no Lower extremity edema: no Dizzy/lightheaded: no  ASTHMA Asthma status: controlled Satisfied with current treatment?: yes Albuterol/rescue inhaler frequency: rarely Dyspnea frequency: none Wheezing frequency:none Cough frequency: none Nocturnal symptom frequency: none Limitation of activity: no Current upper respiratory symptoms: no Aerochamber/spacer use: no Visits to ER or Urgent Care in past year: no Pneumovax: Up to Date Influenza: Up to Date  URINARY SYMPTOMS Duration: about a month Dysuria: yes Urinary frequency: yes Urgency: yes Small volume voids: no Symptom severity: moderate Urinary incontinence: no Foul odor: no Hematuria: no Abdominal pain: no Back pain: no Suprapubic pain/pressure: yes Flank pain: no Fever:  no Vomiting:  no Relief with cranberry juice: no Relief with pyridium: no Status: better/worse/stable Previous urinary tract infection: yes Recurrent urinary tract infection: no History of sexually transmitted disease: no Vaginal discharge: no Treatments attempted: increasing fluids   Relevant past medical, surgical, family and social history reviewed and updated as indicated. Interim medical history since our last visit reviewed. Allergies and medications reviewed and updated.  Review of Systems  Constitutional: Negative.   Respiratory: Negative.    Cardiovascular: Negative.   Gastrointestinal: Negative.   Musculoskeletal: Negative.   Neurological: Negative.   Psychiatric/Behavioral: Negative.      Per HPI unless specifically indicated above     Objective:    BP 133/82   Pulse 87   Temp 98 F (36.7 C) (Oral)   Ht 5' 2.5" (1.588 m)   Wt 149 lb 9.6 oz (67.9 kg)   LMP 07/31/1981 (Approximate)   SpO2 99%   BMI 26.93 kg/m   Wt Readings from Last 3 Encounters:  01/02/23 149 lb 9.6 oz (67.9 kg)  06/13/22 156 lb 12.8 oz (71.1 kg)  02/22/22 155 lb 3.3 oz (70.4 kg)    Physical Exam Vitals and nursing note reviewed.  Constitutional:      General: She is not in acute distress.    Appearance: Normal appearance. She is not ill-appearing, toxic-appearing or diaphoretic.  HENT:     Head: Normocephalic and atraumatic.     Right Ear: External ear normal.     Left Ear: External ear normal.     Nose: Nose normal.     Mouth/Throat:     Mouth: Mucous membranes are moist.     Pharynx: Oropharynx is clear.  Eyes:     General: No scleral icterus.       Right eye: No discharge.        Left eye: No discharge.     Extraocular Movements: Extraocular movements intact.     Conjunctiva/sclera: Conjunctivae normal.     Pupils: Pupils are equal, round, and reactive to light.  Cardiovascular:     Rate and Rhythm: Normal rate and regular rhythm.     Pulses: Normal pulses.     Heart sounds: Normal  heart sounds. No murmur heard.    No friction rub. No gallop.  Pulmonary:     Effort: Pulmonary effort is normal. No respiratory distress.     Breath sounds: Normal breath sounds. No stridor. No wheezing, rhonchi or rales.  Chest:     Chest wall: No tenderness.  Genitourinary:    Comments: Vaginal prolapse Musculoskeletal:        General: Normal range of motion.     Cervical back: Normal range of motion and neck supple.  Skin:    General: Skin is warm and dry.     Capillary Refill: Capillary refill takes less than 2 seconds.     Coloration: Skin is not jaundiced or pale.     Findings: No bruising, erythema, lesion or rash.  Neurological:     General: No focal deficit present.     Mental Status: She is alert and oriented to person, place, and time. Mental status is at baseline.  Psychiatric:        Mood and Affect: Mood normal.        Behavior: Behavior normal.        Thought Content: Thought content normal.        Judgment: Judgment normal.     Results for orders placed or performed in visit on 09/22/22  HM DEXA SCAN  Result Value Ref Range   HM Dexa Scan see result scanned into chart       Assessment & Plan:   Problem List Items Addressed This Visit       Cardiovascular and Mediastinum   Benign essential HTN    Doing well off medicine. Continue to monitor. Call with any concerns.       Relevant Medications   atorvastatin (LIPITOR) 40 MG tablet   Other Relevant Orders   CBC with Differential/Platelet   Comprehensive metabolic panel   Urinalysis, Routine w reflex microscopic   TSH   Urine Microalbumin w/creat. ratio   Aortic atherosclerosis (HCC)    Will keep BP and cholesterol under good control. Continue to monitor.       Relevant Medications   atorvastatin (LIPITOR) 40 MG tablet   Other Relevant Orders   CBC with Differential/Platelet   Comprehensive metabolic panel   Lipid Panel w/o Chol/HDL Ratio     Respiratory   Asthma    Under good control on  current regimen. Continue current regimen. Continue to monitor. Call with any concerns. Refills up to date. Encouraged her to take her medicine before the allergy season.       Relevant Medications   montelukast (SINGULAIR) 10 MG tablet   ILD (interstitial lung disease) (HCC)    Stable. Call with any concerns.         Digestive   GERD (gastroesophageal reflux disease)    Under good control on current regimen. Continue current regimen. Continue to monitor. Call with any concerns. Refills given. Labs drawn today.        Relevant Orders  CBC with Differential/Platelet   Comprehensive metabolic panel     Genitourinary   Stage 3a chronic kidney disease ([place])    Rechecking labs today. Await results. Treat as needed.       Relevant Orders   CBC with Differential/Platelet   Comprehensive metabolic panel   Vaginal prolapse    Will get her into Pelvic floor PT. Call with any concerns.       Relevant Orders   Ambulatory referral to Physical Therapy     Other   Hyperlipidemia - Primary    Under good control on current regimen. Continue current regimen. Continue to monitor. Call with any concerns. Refills given. Labs drawn today.       Relevant Medications   atorvastatin (LIPITOR) 40 MG tablet   Other Relevant Orders   CBC with Differential/Platelet   Comprehensive metabolic panel   Lipid Panel w/o Chol/HDL Ratio   Menopause    Has not been taking her estradiol. Doing OK. Will call if she needs a refill.       Vitamin D deficiency    Rechecking labs today. Await results. Treat as needed.       Relevant Orders   CBC with Differential/Platelet   Comprehensive metabolic panel   VITAMIN D 25 Hydroxy (Vit-D Deficiency, Fractures)     Follow up plan: Return in about 6 months (around 07/03/2023).

## 2023-01-03 LAB — COMPREHENSIVE METABOLIC PANEL
ALT: 10 IU/L (ref 0–32)
AST: 19 IU/L (ref 0–40)
Albumin/Globulin Ratio: 1.9 (ref 1.2–2.2)
Albumin: 4.1 g/dL (ref 3.8–4.8)
Alkaline Phosphatase: 105 IU/L (ref 44–121)
BUN/Creatinine Ratio: 11 — ABNORMAL LOW (ref 12–28)
BUN: 10 mg/dL (ref 8–27)
Bilirubin Total: 0.2 mg/dL (ref 0.0–1.2)
CO2: 24 mmol/L (ref 20–29)
Calcium: 9.7 mg/dL (ref 8.7–10.3)
Chloride: 107 mmol/L — ABNORMAL HIGH (ref 96–106)
Creatinine, Ser: 0.92 mg/dL (ref 0.57–1.00)
Globulin, Total: 2.2 g/dL (ref 1.5–4.5)
Glucose: 125 mg/dL — ABNORMAL HIGH (ref 70–99)
Potassium: 4.1 mmol/L (ref 3.5–5.2)
Sodium: 147 mmol/L — ABNORMAL HIGH (ref 134–144)
Total Protein: 6.3 g/dL (ref 6.0–8.5)
eGFR: 65 mL/min/{1.73_m2} (ref >59)

## 2023-01-03 LAB — CBC WITH DIFFERENTIAL/PLATELET
Basophils Absolute: 0.1 10*3/uL (ref 0.0–0.2)
Basos: 1 %
EOS (ABSOLUTE): 0.4 10*3/uL (ref 0.0–0.4)
Eos: 6 %
Hematocrit: 40.6 % (ref 34.0–46.6)
Hemoglobin: 12.9 g/dL (ref 11.1–15.9)
Immature Grans (Abs): 0 10*3/uL (ref 0.0–0.1)
Immature Granulocytes: 0 %
Lymphocytes Absolute: 1.7 10*3/uL (ref 0.7–3.1)
Lymphs: 27 %
MCH: 25 pg — ABNORMAL LOW (ref 26.6–33.0)
MCHC: 31.8 g/dL (ref 31.5–35.7)
MCV: 79 fL (ref 79–97)
Monocytes Absolute: 0.5 10*3/uL (ref 0.1–0.9)
Monocytes: 8 %
Neutrophils Absolute: 3.6 10*3/uL (ref 1.4–7.0)
Neutrophils: 58 %
Platelets: 378 10*3/uL (ref 150–450)
RBC: 5.15 x10E6/uL (ref 3.77–5.28)
RDW: 15 % (ref 11.7–15.4)
WBC: 6.2 10*3/uL (ref 3.4–10.8)

## 2023-01-03 LAB — LIPID PANEL W/O CHOL/HDL RATIO
Cholesterol, Total: 254 mg/dL — ABNORMAL HIGH (ref 100–199)
HDL: 74 mg/dL (ref >39)
LDL Chol Calc (NIH): 150 mg/dL — ABNORMAL HIGH (ref 0–99)
Triglycerides: 172 mg/dL — ABNORMAL HIGH (ref 0–149)
VLDL Cholesterol Cal: 30 mg/dL (ref 5–40)

## 2023-01-03 LAB — VITAMIN D 25 HYDROXY (VIT D DEFICIENCY, FRACTURES): Vit D, 25-Hydroxy: 29 ng/mL — ABNORMAL LOW (ref 30.0–100.0)

## 2023-01-03 LAB — MICROALBUMIN / CREATININE URINE RATIO
Creatinine, Urine: 61.3 mg/dL
Microalb/Creat Ratio: 12 mg/g creat (ref 0–29)
Microalbumin, Urine: 7.6 ug/mL

## 2023-01-03 LAB — TSH: TSH: 0.784 u[IU]/mL (ref 0.450–4.500)

## 2023-01-15 ENCOUNTER — Ambulatory Visit: Payer: Medicare Other | Attending: Family Medicine | Admitting: Physical Therapy

## 2023-01-15 ENCOUNTER — Encounter: Payer: Self-pay | Admitting: Physical Therapy

## 2023-01-15 DIAGNOSIS — R29898 Other symptoms and signs involving the musculoskeletal system: Secondary | ICD-10-CM | POA: Insufficient documentation

## 2023-01-15 DIAGNOSIS — N8110 Cystocele, unspecified: Secondary | ICD-10-CM | POA: Insufficient documentation

## 2023-01-15 DIAGNOSIS — M6281 Muscle weakness (generalized): Secondary | ICD-10-CM

## 2023-01-15 DIAGNOSIS — R278 Other lack of coordination: Secondary | ICD-10-CM | POA: Diagnosis not present

## 2023-01-15 NOTE — Therapy (Signed)
OUTPATIENT PHYSICAL THERAPY FEMALE PELVIC EVALUATION   Patient Name: Nancy Mack MRN: [id number] DOB:December 01, 1946, 77 y.o., female Today's Date: 01/15/2023  END OF SESSION:  PT End of Session - 01/15/23 1112     Visit Number 1    Number of Visits 12    Date for PT Re-Evaluation 04/09/23    Authorization Type IE 01/15/2023    PT Start Time 1115    PT Stop Time 1155    PT Time Calculation (min) 40 min    Activity Tolerance Patient tolerated treatment well    Behavior During Therapy WFL for tasks assessed/performed             Past Medical History:  Diagnosis Date   Allergy    Anemia    Arthritis    Asthma    Carpal tunnel syndrome    Chronic kidney disease    Dyspnea    Eczema    Globus pharyngeus    Hyperlipidemia    Hypertension    Laryngopharyngeal reflux    Menopause    Neuroma    right foot   Osteoporosis    Pneumonia    Raynaud's disease    Rhinitis due to pollen    SOB (shortness of breath)    Past Surgical History:  Procedure Laterality Date   COLONOSCOPY WITH PROPOFOL N/A 03/04/2019   Procedure: COLONOSCOPY WITH PROPOFOL;  Surgeon: [name], MD;  Location: [facility];  Service: Endoscopy;  Laterality: N/A;   EYE SURGERY Left 2015   broken blood veesel, Dr.Matthews    EYE SURGERY Bilateral 2017   In [place]    FOOT NEUROMA SURGERY Right    PARTIAL HYSTERECTOMY     SHOULDER ARTHROSCOPY WITH BICEPSTENOTOMY Right 04/08/2018   Procedure: SHOULDER ARTHROSCOPY WITH BICEPSTENOTOMY;  Surgeon: Lovell Sheehan, MD;  Location: [facility] ORS;  Service: Orthopedics;  Laterality: Right;   TOTAL SHOULDER ARTHROPLASTY Left 02/22/2022   Procedure: TOTAL SHOULDER ARTHROPLASTY;  Surgeon: Lovell Sheehan, MD;  Location: [facility] ORS;  Service: Orthopedics;  Laterality: Left;   Patient Active Problem List   Diagnosis Date Noted   Vaginal prolapse 01/02/2023   Status post total shoulder replacement, 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   Aortic atherosclerosis ([place]) 03/09/2021    ILD (interstitial lung disease) ([place]) 11/04/2020   Stage 3a chronic kidney disease ([place]) 08/05/2020   Vitamin D deficiency 10/16/2018   Advanced care planning/counseling discussion 03/06/2018   Menopause 03/06/2018   Arthritis of right shoulder region 03/06/2018   Overweight 07/25/2017   Bursitis of left hip 01/28/2016   Nocturnal muscle cramps 01/28/2016   Allergic rhinitis 10/27/2015   Hyperlipidemia 10/27/2015   Carpal tunnel syndrome 10/27/2015   Raynaud's disease 10/27/2015   Osteopenia 10/27/2015   Asthma 10/27/2015   Eczema 10/27/2015   GERD (gastroesophageal reflux disease) 06/14/2015   Benign essential HTN 09/01/2014    PCP: Valerie Roys, DO  REFERRING PROVIDER: Valerie Roys, DO  REFERRING DIAG: N81.10 (ICD-10-CM) - Vaginal prolapse   THERAPY DIAG:  Other lack of coordination  Muscle weakness (generalized)  Poor body mechanics  Rationale for Evaluation and Treatment: Rehabilitation  PRECAUTIONS: None  WEIGHT BEARING RESTRICTIONS: No  FALLS:  Has patient fallen in last 6 months? No  ONSET DATE: 11/2022  SUBJECTIVE:  CHIEF COMPLAINT: Patient thought her symptoms were originally coming from a medication, but was told this is not true by her provider. Patient started to notice symptoms at night; symptom presentation started with need to double void. Patient also notes urgency when at home, but has good bladder control in the community. Patient denies pain, but does note pressure.   Patient does report childhood history of delayed voiding.   PERTINENT HISTORY/CHART REVIEW:  Red flags (bowel/bladder changes, saddle paresthesia, personal history of cancer, h/o spinal tumors, h/o compression fx, h/o abdominal aneurysm, abdominal pain, chills/fever, night sweats, nausea,  vomiting, unrelenting pain, first onset of insidious LBP <20 y/o): Negative   PAIN:  Are you having pain? No   OCCUPATION/LEISURE ACTIVITIES:  Housework, soap operas, cooking, computer games, walking/stretches  PLOF:  Independent  PATIENT GOALS: Relieving the pressure  OBSTETRICAL HISTORY: G1P1 Deliveries: SVD Tearing/Episiotomy: lateral tear (unsure of grade)   GYNECOLOGICAL HISTORY: Hysterectomy: Yes  Last Menstrual Period:  Pain with exam: No  Prolapse: Referred for "vaginal prolapse" Heaviness/pressure: No   UROLOGICAL HISTORY: Frequency of urination: every 1-5 hours Incontinence:  None Fluid Intake: 2-3x 32 oz H20, 1 cup coffee caffeinated, occasional cranberry/orange juices Nocturia: 1x/night Toileting posture: feet flat Incomplete emptying: Yes  Pain with urination: Negative Stream: Strong Urgency: Yes  Difficulty initiating urination: Negative Intermittent stream: Positive Frequent UTI: Negative.   GASTROINTESTINAL HISTORY: Type of bowel movement: (Bristol Stool Scale) 4 Frequency of BMs: 1x/day Incomplete bowel movement: No  Pain with defecation: Negative Straining with defecation: Negative Toileting posture: feet flat, or heels elevated   SEXUAL HISTORY AND FUNCTION: Sexually active: Yes  Pain with penetration: none Sexual abuse: No    OBJECTIVE:  DIAGNOSTIC TESTING/IMAGING: None noted during chart review  COGNITION:  Patient is oriented to person, place, and time.  Recent memory is intact.  Remote memory is intact.  Attention span and concentration are intact.  Expressive speech is intact.  Patient's fund of knowledge is within normal limits for educational level.    POSTURE/OBSERVATIONS:   Lumbar lordosis: diminished Thoracic kyphosis: increased Iliac crest height: not formally assessed  Lumbar lateral shift: not formally assessed  Pelvic obliquity: not formally assessed  Leg length discrepancy: not formally assessed     GAIT: Grossly WFL. Patient ambulates with increased hip ER, B and limited pelvic rotation throughout gait cycle.    RANGE OF MOTION: deferred 2/2 to time constraints  AROM (Normal range in degrees) AROM  01/15/2023  Lumbar   Flexion (65)   Extension (30)   Right lateral flexion (25)   Left lateral flexion (25)   Right rotation (30)   Left rotation (30)       Hip LEFT RIGHT  Flexion (125)    Extension (15)    Abduction (40)    Adduction     Internal Rotation (45)    External Rotation (45)    (* = pain; blank rows = not tested)   SENSATION: deferred 2/2 to time constraints  Grossly intact to light touch bilateral LEs as determined by testing dermatomes L2-S2 Proprioception and hot/cold testing deferred on this date   STRENGTH: MMT deferred 2/2 to time constraints   RLE LLE  Hip Flexion    Hip Extension    Hip Abduction     Hip Adduction     Hip ER     Hip IR     Knee Extension    Knee Flexion    Dorsiflexion     Plantarflexion (seated)    (* =  pain; blank rows = not tested)   MUSCLE LENGTH: deferred 2/2 to time constraints   ABDOMINAL: deferred 2/2 to time constraints  Palpation: Diastasis: Scar mobility: Rib flare:   SPECIAL TESTS: deferred 2/2 to time constraints   PALPATION: deferred 2/2 to time constraints  LOCATION LEFT  RIGHT           Lumbar paraspinals    Quadratus Lumborum    Gluteus Maximus    Gluteus Medius    Deep hip external rotators    PSIS    Fortin's Area (SIJ)    Greater Trochanter    ASIS    Sacral border    Coccyx    Ischial tuberosity    (blank rows = not tested) Graded on 0-4 scale (0 = no pain, 1 = pain, 2 = pain with wincing/grimacing/flinching, 3 = pain with withdrawal, 4 = unwilling to allow palpation)   PHYSICAL PERFORMANCE MEASURES:  STS:  Deep Squat: RLE STS: LLE STS:  6MWT: 5TSTS:     EXTERNAL PELVIC EXAM: deferred 2/2 to time constraints  Breath coordination: Voluntary Contraction:  present/absent Relaxation: full/delayed/non-relaxing Perineal movement with sustained IAP increase ("bear down"): descent/no change/elevation/excessive descent Perineal movement with rapid IAP increase ("cough"): elevation/no change/descent Palpation of bulbocavernosus: Palpation of ischiocavernosus: Palpation of pubic symphysis: Palpation of superficial transverse perineal:   INTERNAL VAGINAL EXAM: deferred 2/2 to time constraints  Introitus Appears:  Skin integrity:  Scar mobility: Strength (PERF): /5 Laycock MMT,  sec x  reps,  fast twitch Symmetry: Palpation: Prolapse: (0 no contraction, 1 flicker, 2 weak squeeze and no lift, 3 fair squeeze and definite lift, 4 good squeeze and lift against resistance, 5 strong squeeze against strong resistance)     PATIENT EDUCATION:  Patient educated on prognosis, POC, and provided with HEP including: not initiated. Patient articulated understanding and returned demonstration. Patient will benefit from further education in order to maximize compliance and understanding for long-term therapeutic gains.   PATIENT SURVEYS:  FOTO Urinary Problem 60, predicted to 9 PFDI Urinary 4  ASSESSMENT:  Clinical Impression: Patient is a 77 year old presenting to clinic with chief complaints of urinary urgency and incomplete emptying. Today's evaluation is suggestive of deficits in PFM coordination, PFM strength, IAP management, posture, and body mechanics as evidenced by incomplete bladder emptying, double voiding, intermittent stream, increased thoracic kyphosis, diminished lumbar lordosis. Patient's responses on FOTO outcome measures (Urinary 60) indicate significant functional limitations/disability/distress. Patient's progress may be limited due to comorbidities; however, patient's motivation is advantageous. Patient was able to achieve basic understanding of bladder irritants and strategies for compete emptying during today's evaluation and responded  positively to educational interventions. Patient will benefit from continued skilled therapeutic intervention to address deficits in PFM coordination, PFM strength, IAP management, posture, and body mechanics in order to increase function and improve overall QOL.   Objective impairments: decreased activity tolerance, decreased coordination, decreased endurance, decreased strength, improper body mechanics, postural dysfunction, and pain.   Activity limitations: meal prep, laundry, driving, shopping, and yard work.   Personal factors: Age, Behavior pattern, Past/current experiences, Time since onset of injury/illness/exacerbation, and 3+ comorbidities: Mixed hyperlipidemia, stage 3a kidney disease, ILD, GERD, HTN, persistent asthma  are also affecting patient's functional outcome.   Rehab Potential: Fair  Clinical decision making: Evolving/moderate complexity  Evaluation complexity: Moderate   GOALS: Goals reviewed with patient? Yes  SHORT TERM GOALS: Target date: 02/26/2023  Patient will demonstrate independence with HEP in order to maximize therapeutic gains and improve carryover from  physical therapy sessions to ADLs in the home and community. Baseline: not initiated Goal status: INITIAL    LONG TERM GOALS: Target date: 04/09/2023  Patient will demonstrate improved function as evidenced by a score of 71 on FOTO measure for full participation in activities at home and in the community.  Baseline: 60 Goal status: INITIAL  Patient will demonstrate circumferential and sequential contraction of >3/5 MMT, > 5 sec hold x5 and 5 consecutive quick flicks with </= 10 min rest between testing bouts, and relaxation of the PFM coordinated with breath for improved management of intra-abdominal pressure and normal bowel and bladder function without the presence of pain nor incontinence in order to improve participation at home and in the community.  Baseline: not formally assessed  Goal status:  INITIAL  Patient will be able to articulate and demonstrate 3-5 postural strengthening interventions for improved independent management of posture/body mechanics/IAP.     Baseline: not formally assessed  Goal status: INITIAL  Patient will decrease worst suprapubic pressure as reported on 0-10 scale by at least 2 points to demonstrate reduction in pressure in order to restore/improve function and overall QOL.  Baseline: not formally assessed  Goal status: INITIAL   PLAN: Rehab frequency: 1x/week  Rehab duration: 12 weeks  Planned interventions: Therapeutic exercises, Therapeutic activity, Neuromuscular re-education, Balance training, Gait training, Patient/Family education, Self Care, Joint mobilization, Orthotic/Fit training, Electrical stimulation, Spinal mobilization, Cryotherapy, Moist heat, and Manual therapy     Myles Gip PT, DPT [phone]  01/15/2023, 11:12 AM

## 2023-01-23 ENCOUNTER — Ambulatory Visit: Payer: Medicare Other | Attending: Family Medicine | Admitting: Physical Therapy

## 2023-01-23 ENCOUNTER — Encounter: Payer: Self-pay | Admitting: Physical Therapy

## 2023-01-23 DIAGNOSIS — M6281 Muscle weakness (generalized): Secondary | ICD-10-CM | POA: Diagnosis not present

## 2023-01-23 DIAGNOSIS — R278 Other lack of coordination: Secondary | ICD-10-CM | POA: Insufficient documentation

## 2023-01-23 DIAGNOSIS — R29898 Other symptoms and signs involving the musculoskeletal system: Secondary | ICD-10-CM

## 2023-01-23 NOTE — Therapy (Signed)
OUTPATIENT PHYSICAL THERAPY FEMALE PELVIC TREATMENT   Patient Name: Nancy Mack MRN: [id number] DOB:10-27-1946, 77 y.o., female Today's Date: 01/23/2023  END OF SESSION:  PT End of Session - 01/23/23 0903     Visit Number 2    Number of Visits 12    Date for PT Re-Evaluation 04/09/23    Authorization Type IE 01/15/2023    PT Start Time 0901    PT Stop Time 0940    PT Time Calculation (min) 39 min    Activity Tolerance Patient tolerated treatment well    Behavior During Therapy WFL for tasks assessed/performed             Past Medical History:  Diagnosis Date   Allergy    Anemia    Arthritis    Asthma    Carpal tunnel syndrome    Chronic kidney disease    Dyspnea    Eczema    Globus pharyngeus    Hyperlipidemia    Hypertension    Laryngopharyngeal reflux    Menopause    Neuroma    right foot   Osteoporosis    Pneumonia    Raynaud's disease    Rhinitis due to pollen    SOB (shortness of breath)    Past Surgical History:  Procedure Laterality Date   COLONOSCOPY WITH PROPOFOL N/A 03/04/2019   Procedure: COLONOSCOPY WITH PROPOFOL;  Surgeon: [name], MD;  Location: [facility];  Service: Endoscopy;  Laterality: N/A;   EYE SURGERY Left 2015   broken blood veesel, Dr.Matthews    EYE SURGERY Bilateral 2017   In [place]    FOOT NEUROMA SURGERY Right    PARTIAL HYSTERECTOMY     SHOULDER ARTHROSCOPY WITH BICEPSTENOTOMY Right 04/08/2018   Procedure: SHOULDER ARTHROSCOPY WITH BICEPSTENOTOMY;  Surgeon: Lovell Sheehan, MD;  Location: [facility];  Service: Orthopedics;  Laterality: Right;   TOTAL SHOULDER ARTHROPLASTY Left 02/22/2022   Procedure: TOTAL SHOULDER ARTHROPLASTY;  Surgeon: Lovell Sheehan, MD;  Location: [facility];  Service: Orthopedics;  Laterality: Left;   Patient Active Problem List   Diagnosis Date Noted   Vaginal prolapse 01/02/2023   Status post total shoulder replacement, 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   Aortic atherosclerosis ([place]) 03/09/2021   ILD  (interstitial lung disease) ([place]) 11/04/2020   Stage 3a chronic kidney disease ([place]) 08/05/2020   Vitamin D deficiency 10/16/2018   Advanced care planning/counseling discussion 03/06/2018   Menopause 03/06/2018   Arthritis of right shoulder region 03/06/2018   Overweight 07/25/2017   Bursitis of left hip 01/28/2016   Nocturnal muscle cramps 01/28/2016   Allergic rhinitis 10/27/2015   Hyperlipidemia 10/27/2015   Carpal tunnel syndrome 10/27/2015   Raynaud's disease 10/27/2015   Osteopenia 10/27/2015   Asthma 10/27/2015   Eczema 10/27/2015   GERD (gastroesophageal reflux disease) 06/14/2015   Benign essential HTN 09/01/2014    PCP: Valerie Roys, DO  REFERRING PROVIDER: Valerie Roys, DO  REFERRING DIAG: N81.10 (ICD-10-CM) - Vaginal prolapse   THERAPY DIAG:  Other lack of coordination  Muscle weakness (generalized)  Poor body mechanics  Rationale for Evaluation and Treatment: Rehabilitation  PRECAUTIONS: None  WEIGHT BEARING RESTRICTIONS: No  FALLS:  Has patient fallen in last 6 months? No  ONSET DATE: 11/2022  SUBJECTIVE:  CHIEF COMPLAINT: Patient thought her symptoms were originally coming from a medication, but was told this is not true by her provider. Patient started to notice symptoms at night; symptom presentation started with need to double void. Patient also notes urgency when at home, but has good bladder control in the community. Patient denies pain, but does note pressure.   Patient does report childhood history of delayed voiding.   PERTINENT HISTORY/CHART REVIEW:  Red flags (bowel/bladder changes, saddle paresthesia, personal history of cancer, h/o spinal tumors, h/o compression fx, h/o abdominal aneurysm, abdominal pain, chills/fever, night sweats, nausea, vomiting,  unrelenting pain, first onset of insidious LBP <20 y/o): Negative   PAIN:  Are you having pain? No   OCCUPATION/LEISURE ACTIVITIES:  Housework, soap operas, cooking, computer games, walking/stretches  PLOF:  Independent  PATIENT GOALS: Relieving the pressure  OBSTETRICAL HISTORY: G1P1 Deliveries: SVD Tearing/Episiotomy: lateral tear (unsure of grade)   GYNECOLOGICAL HISTORY: Hysterectomy: Yes  Last Menstrual Period:  Pain with exam: No  Prolapse: Referred for "vaginal prolapse" Heaviness/pressure: No   UROLOGICAL HISTORY: Frequency of urination: every 1-5 hours Incontinence:  None Fluid Intake: 2-3x 32 oz H20, 1 cup coffee caffeinated, occasional cranberry/orange juices Nocturia: 1x/night Toileting posture: feet flat Incomplete emptying: Yes  Pain with urination: Negative Stream: Strong Urgency: Yes  Difficulty initiating urination: Negative Intermittent stream: Positive Frequent UTI: Negative.   GASTROINTESTINAL HISTORY: Type of bowel movement: (Bristol Stool Scale) 4 Frequency of BMs: 1x/day Incomplete bowel movement: No  Pain with defecation: Negative Straining with defecation: Negative Toileting posture: feet flat, or heels elevated   SEXUAL HISTORY AND FUNCTION: Sexually active: Yes  Pain with penetration: none Sexual abuse: No    OBJECTIVE:  DIAGNOSTIC TESTING/IMAGING: None noted during chart review  COGNITION:  Patient is oriented to person, place, and time.  Recent memory is intact.  Remote memory is intact.  Attention span and concentration are intact.  Expressive speech is intact.  Patient's fund of knowledge is within normal limits for educational level.    POSTURE/OBSERVATIONS:   Lumbar lordosis: diminished Thoracic kyphosis: increased Iliac crest height: not formally assessed  Lumbar lateral shift: not formally assessed  Pelvic obliquity: not formally assessed  Leg length discrepancy: not formally assessed     GAIT: Grossly WFL. Patient ambulates with increased hip ER, B and limited pelvic rotation throughout gait cycle.  TREATMENT- 01/23/2023 SUBJECTIVE: Patient denies any significant changes or concerns since initial evaluation.  PAIN: No  Pre-treatment assessment: RANGE OF MOTION:   AROM (Normal range in degrees) AROM  01/23/2023  Lumbar   Flexion (65) WFL  Extension (30) WFL  Right lateral flexion (25) WFL  Left lateral flexion (25) WFL  Right rotation (30) WFL  Left rotation (30) WFL      Hip LEFT RIGHT  Flexion (125) WFL WFL  Extension (15)    Abduction (40) WFL WFL  Adduction     Internal Rotation (45) [facility]  External Rotation (45) WFL WFL  (* = pain; blank rows = not tested)   ABDOMINAL:   Palpation: no TTP Diastasis: roughly 3 finger at umbilicus with good tensegrity, rectus dominant (WNL superior and inferior) Scar mobility: Rib flare: none noted   PHYSICAL PERFORMANCE MEASURES:  STS: WNL Deep Squat: RLE STS: LLE STS:  6MWT: 5TSTS:     EXTERNAL PELVIC EXAM:  Patient educated on the purpose of the procedure/exam and articulated understanding and consented to the procedure/exam.  Breath coordination: present Voluntary Contraction: present Relaxation: delayed Perineal movement with sustained  IAP increase ("bear down"): descent Perineal movement with rapid IAP increase ("cough"): elevation  Manual Therapy:   Neuromuscular Re-education:   Therapeutic Exercise: - Supine Diaphragmatic Breathing  - Supine Hamstring Stretch - Supine Figure 4 Piriformis Stretch - Hut Exercise  - Seated Pelvic Tilts - Seated Marching with Opposite Shoulder Flexion   Treatments unbilled:  Post-treatment assessment:    Patient Response to interventions:    PATIENT EDUCATION:  Provided with HEP including: [id number].  Patient educated throughout session on appropriate technique and form using multi-modal cueing, HEP, and activity modification. Patient  articulated understanding and returned demonstration.   PATIENT SURVEYS:  FOTO Urinary Problem 60, predicted to 108 PFDI Urinary 4  ASSESSMENT:  Clinical Impression: Patient presents to clinic with excellent motivation to participate in therapy. Patient demonstrates deficits in PFM coordination, PFM strength, IAP management, posture, and body mechanics. Patient able to achieve adequate understanding of breathwork, hip stretches, and deep core activation during today's session and responded positively to educational and active interventions. Patient will benefit from continued skilled therapeutic intervention to address remaining deficits in PFM coordination, PFM strength, IAP management, posture, and body mechanics  in order to increase function and improve overall QOL.    Objective impairments: decreased activity tolerance, decreased coordination, decreased endurance, decreased strength, improper body mechanics, postural dysfunction, and pain.   Activity limitations: meal prep, laundry, driving, shopping, and yard work.   Personal factors: Age, Behavior pattern, Past/current experiences, Time since onset of injury/illness/exacerbation, and 3+ comorbidities: Mixed hyperlipidemia, stage 3a kidney disease, ILD, GERD, HTN, persistent asthma  are also affecting patient's functional outcome.   Rehab Potential: Fair  Clinical decision making: Evolving/moderate complexity  Evaluation complexity: Moderate   GOALS: Goals reviewed with patient? Yes  SHORT TERM GOALS: Target date: 02/26/2023  Patient will demonstrate independence with HEP in order to maximize therapeutic gains and improve carryover from physical therapy sessions to ADLs in the home and community. Baseline: not initiated Goal status: INITIAL    LONG TERM GOALS: Target date: 04/09/2023  Patient will demonstrate improved function as evidenced by a score of 71 on FOTO measure for full participation in activities at home and in  the community.  Baseline: 60 Goal status: INITIAL  Patient will demonstrate circumferential and sequential contraction of >3/5 MMT, > 5 sec hold x5 and 5 consecutive quick flicks with </= 10 min rest between testing bouts, and relaxation of the PFM coordinated with breath for improved management of intra-abdominal pressure and normal bowel and bladder function without the presence of pain nor incontinence in order to improve participation at home and in the community.  Baseline: not formally assessed  Goal status: INITIAL  Patient will be able to articulate and demonstrate 3-5 postural strengthening interventions for improved independent management of posture/body mechanics/IAP.     Baseline: not formally assessed  Goal status: INITIAL  Patient will decrease worst suprapubic pressure as reported on 0-10 scale by at least 2 points to demonstrate reduction in pressure in order to restore/improve function and overall QOL.  Baseline: not formally assessed  Goal status: INITIAL   PLAN: Rehab frequency: 1x/week  Rehab duration: 12 weeks  Planned interventions: Therapeutic exercises, Therapeutic activity, Neuromuscular re-education, Balance training, Gait training, Patient/Family education, Self Care, Joint mobilization, Orthotic/Fit training, Electrical stimulation, Spinal mobilization, Cryotherapy, Moist heat, and Manual therapy     Myles Gip PT, DPT [phone]  01/23/2023, 9:04 AM

## 2023-01-25 DIAGNOSIS — H40009 Preglaucoma, unspecified, unspecified eye: Secondary | ICD-10-CM | POA: Diagnosis not present

## 2023-02-19 ENCOUNTER — Telehealth: Payer: Self-pay | Admitting: Family Medicine

## 2023-02-19 NOTE — Telephone Encounter (Signed)
Called the pt and got her scheduled for this week.

## 2023-02-19 NOTE — Telephone Encounter (Signed)
appt

## 2023-02-19 NOTE — Telephone Encounter (Signed)
Made in error

## 2023-02-19 NOTE — Telephone Encounter (Signed)
Spoke with patient to follow up in regards to her previous call. Patient is asking if there is something else she can be prescribed or what the provider recommends for her Hot Flashes, as the patient says that Dr [name] and her had discussed her weaning off Estradiol and she cannot deal with the flashes. Please advise?

## 2023-02-19 NOTE — Telephone Encounter (Signed)
Copied from [place] [id number]. Topic: General - Other >> Feb 19, 2023 11:17 AM Sab[place] Sous wrote: Reason for CRM: Pt is requesting for her PCP's nurse to return her call, she has a question for her specifically.

## 2023-02-21 ENCOUNTER — Telehealth: Payer: Self-pay | Admitting: Family Medicine

## 2023-02-21 NOTE — Telephone Encounter (Signed)
Contacted Nancy Mack to schedule their annual wellness visit. Appointment made for 03/20/2023.  Sherol Dade; Care Guide Ambulatory Clinical Support [facility] Direct Dial: [phone]

## 2023-02-22 DIAGNOSIS — E782 Mixed hyperlipidemia: Secondary | ICD-10-CM | POA: Diagnosis not present

## 2023-02-22 DIAGNOSIS — N1831 Chronic kidney disease, stage 3a: Secondary | ICD-10-CM | POA: Diagnosis not present

## 2023-02-22 DIAGNOSIS — I700 Atherosclerosis of aorta: Secondary | ICD-10-CM | POA: Diagnosis not present

## 2023-02-22 DIAGNOSIS — R002 Palpitations: Secondary | ICD-10-CM | POA: Diagnosis not present

## 2023-02-22 DIAGNOSIS — I10 Essential (primary) hypertension: Secondary | ICD-10-CM | POA: Diagnosis not present

## 2023-02-23 ENCOUNTER — Encounter: Payer: Self-pay | Admitting: Family Medicine

## 2023-02-23 ENCOUNTER — Ambulatory Visit (INDEPENDENT_AMBULATORY_CARE_PROVIDER_SITE_OTHER): Payer: Medicare Other | Admitting: Family Medicine

## 2023-02-23 VITALS — BP 130/82 | HR 81 | Temp 98.6°F | Ht 62.5 in | Wt 150.8 lb

## 2023-02-23 DIAGNOSIS — M7062 Trochanteric bursitis, left hip: Secondary | ICD-10-CM | POA: Insufficient documentation

## 2023-02-23 DIAGNOSIS — M7061 Trochanteric bursitis, right hip: Secondary | ICD-10-CM

## 2023-02-23 DIAGNOSIS — Z780 Asymptomatic menopausal state: Secondary | ICD-10-CM | POA: Diagnosis not present

## 2023-02-23 MED ORDER — GABAPENTIN 100 MG PO CAPS: 100.0000 mg | ORAL_CAPSULE | Freq: Three times a day (TID) | ORAL | 3 refills | Status: DC

## 2023-02-23 MED ORDER — TRELEGY ELLIPTA 100-62.5-25 MCG/ACT IN AEPB: 1.0000 | INHALATION_SPRAY | Freq: Every day | RESPIRATORY_TRACT | 4 refills | Status: DC

## 2023-02-23 NOTE — Progress Notes (Signed)
BP 130/82   Pulse 81   Temp 98.6 F (37 C) (Oral)   Ht 5' 2.5" (1.588 m)   Wt 150 lb 12.8 oz (68.4 kg)   LMP 07/31/1981 (Approximate)   SpO2 97%   BMI 27.14 kg/m    Subjective:    Patient ID: Nancy Mack, female    DOB: [birth year], 77 y.o.   MRN: [id number]  HPI: Nancy Mack is a 77 y.o. female  Chief Complaint  Patient presents with   Hot Flashes    Patient says she would like to discuss different treatment options for hot flashes. Patient says she has stopped Estradiol tablet.    MENOPAUSAL SYMPTOMS- has been off her estrogen for about 3-4 months. About 2 weeks ago she started with some severe night sweats and hot flashes. She is getting hot flashes about 10x in a 24 hour period Duration: uncontrolled Symptom severity: severe Hot flashes: yes Night sweats: yes Sleep disturbances: yes Vaginal dryness: no Dyspareunia:no Decreased libido: no Emotional lability: no Stress incontinence: no Previous HRT/pharmacotherapy: yes Hysterectomy: yes   Relevant past medical, surgical, family and social history reviewed and updated as indicated. Interim medical history since our last visit reviewed. Allergies and medications reviewed and updated.  Review of Systems  Constitutional:  Positive for diaphoresis. Negative for activity change, appetite change, chills, fatigue, fever and unexpected weight change.  Respiratory: Negative.    Cardiovascular: Negative.   Gastrointestinal: Negative.   Musculoskeletal:  Positive for arthralgias (Has been having some issues with busitis in her R hip). Negative for back pain, gait problem, joint swelling, myalgias, neck pain and neck stiffness.  Neurological: Negative.   Psychiatric/Behavioral: Negative.      Per HPI unless specifically indicated above     Objective:    BP 130/82   Pulse 81   Temp 98.6 F (37 C) (Oral)   Ht 5' 2.5" (1.588 m)   Wt 150 lb 12.8 oz (68.4 kg)   LMP 07/31/1981 (Approximate)   SpO2 97%    BMI 27.14 kg/m   Wt Readings from Last 3 Encounters:  02/23/23 150 lb 12.8 oz (68.4 kg)  01/02/23 149 lb 9.6 oz (67.9 kg)  06/13/22 156 lb 12.8 oz (71.1 kg)    Physical Exam Vitals and nursing note reviewed.  Constitutional:      General: She is not in acute distress.    Appearance: Normal appearance. She is normal weight. She is not ill-appearing, toxic-appearing or diaphoretic.  HENT:     Head: Normocephalic and atraumatic.     Right Ear: External ear normal.     Left Ear: External ear normal.     Nose: Nose normal.     Mouth/Throat:     Mouth: Mucous membranes are moist.     Pharynx: Oropharynx is clear.  Eyes:     General: No scleral icterus.       Right eye: No discharge.        Left eye: No discharge.     Extraocular Movements: Extraocular movements intact.     Conjunctiva/sclera: Conjunctivae normal.     Pupils: Pupils are equal, round, and reactive to light.  Cardiovascular:     Rate and Rhythm: Normal rate and regular rhythm.     Pulses: Normal pulses.     Heart sounds: Normal heart sounds. No murmur heard.    No friction rub. No gallop.  Pulmonary:     Effort: Pulmonary effort is normal. No respiratory distress.  Breath sounds: Normal breath sounds. No stridor. No wheezing, rhonchi or rales.  Chest:     Chest wall: No tenderness.  Musculoskeletal:        General: No swelling, tenderness, deformity or signs of injury. Normal range of motion.     Cervical back: Normal range of motion and neck supple.     Right lower leg: No edema.     Left lower leg: No edema.  Skin:    General: Skin is warm and dry.     Capillary Refill: Capillary refill takes less than 2 seconds.     Coloration: Skin is not jaundiced or pale.     Findings: No bruising, erythema, lesion or rash.  Neurological:     General: No focal deficit present.     Mental Status: She is alert and oriented to person, place, and time. Mental status is at baseline.  Psychiatric:        Mood and Affect:  Mood normal.        Behavior: Behavior normal.        Thought Content: Thought content normal.        Judgment: Judgment normal.     Results for orders placed or performed in visit on 01/02/23  CBC with Differential/Platelet  Result Value Ref Range   WBC 6.2 3.4 - 10.8 x10E3/uL   RBC 5.15 3.77 - 5.28 x10E6/uL   Hemoglobin 12.9 11.1 - 15.9 g/dL   Hematocrit 40.6 34.0 - 46.6 %   MCV 79 79 - 97 fL   MCH 25.0 (L) 26.6 - 33.0 pg   MCHC 31.8 31.5 - 35.7 g/dL   RDW 15.0 11.7 - 15.4 %   Platelets 378 150 - 450 x10E3/uL   Neutrophils 58 Not Estab. %   Lymphs 27 Not Estab. %   Monocytes 8 Not Estab. %   Eos 6 Not Estab. %   Basos 1 Not Estab. %   Neutrophils Absolute 3.6 1.4 - 7.0 x10E3/uL   Lymphocytes Absolute 1.7 0.7 - 3.1 x10E3/uL   Monocytes Absolute 0.5 0.1 - 0.9 x10E3/uL   EOS (ABSOLUTE) 0.4 0.0 - 0.4 x10E3/uL   Basophils Absolute 0.1 0.0 - 0.2 x10E3/uL   Immature Granulocytes 0 Not Estab. %   Immature Grans (Abs) 0.0 0.0 - 0.1 x10E3/uL  Comprehensive metabolic panel  Result Value Ref Range   Glucose 125 (H) 70 - 99 mg/dL   BUN 10 8 - 27 mg/dL   Creatinine, Ser 0.92 0.57 - 1.00 mg/dL   eGFR 65 >59 mL/min/1.73   BUN/Creatinine Ratio 11 (L) 12 - 28   Sodium 147 (H) 134 - 144 mmol/L   Potassium 4.1 3.5 - 5.2 mmol/L   Chloride 107 (H) 96 - 106 mmol/L   CO2 24 20 - 29 mmol/L   Calcium 9.7 8.7 - 10.3 mg/dL   Total Protein 6.3 6.0 - 8.5 g/dL   Albumin 4.1 3.8 - 4.8 g/dL   Globulin, Total 2.2 1.5 - 4.5 g/dL   Albumin/Globulin Ratio 1.9 1.2 - 2.2   Bilirubin Total 0.2 0.0 - 1.2 mg/dL   Alkaline Phosphatase 105 44 - 121 IU/L   AST 19 0 - 40 IU/L   ALT 10 0 - 32 IU/L  Lipid Panel w/o Chol/HDL Ratio  Result Value Ref Range   Cholesterol, Total 254 (H) 100 - 199 mg/dL   Triglycerides 172 (H) 0 - 149 mg/dL   HDL 74 >39 mg/dL   VLDL Cholesterol Cal 30 5 - 40 mg/dL  LDL Chol Calc (NIH) 150 (H) 0 - 99 mg/dL  Urinalysis, Routine w reflex microscopic  Result Value Ref Range    Specific Gravity, UA 1.015 1.005 - 1.030   pH, UA 5.5 5.0 - 7.5   Color, UA Yellow Yellow   Appearance Ur Clear Clear   Leukocytes,UA Negative Negative   Protein,UA Negative Negative/Trace   Glucose, UA Negative Negative   Ketones, UA Negative Negative   RBC, UA Negative Negative   Bilirubin, UA Negative Negative   Urobilinogen, Ur 0.2 0.2 - 1.0 mg/dL   Nitrite, UA Negative Negative   Microscopic Examination Comment   TSH  Result Value Ref Range   TSH 0.784 0.450 - 4.500 uIU/mL  VITAMIN D 25 Hydroxy (Vit-D Deficiency, Fractures)  Result Value Ref Range   Vit D, 25-Hydroxy 29.0 (L) 30.0 - 100.0 ng/mL  Urine Microalbumin w/creat. ratio  Result Value Ref Range   Creatinine, Urine 61.3 Not Estab. mg/dL   Microalbumin, Urine 7.6 Not Estab. ug/mL   Microalb/Creat Ratio 12 0 - 29 mg/g creat      Assessment & Plan:   Problem List Items Addressed This Visit       Musculoskeletal and Integument   Trochanteric bursitis of right hip    Acute. Ongoing. Provided hip exercises. Recommend Voltaren gel use. If no better recommend steroid injection for pain relief. Return in 1 month.        Other   Menopause - Primary    Chronic, ongoing. Has been off Estrace for 3-4 months now, will start Gabapentin 100 mg at bedtime for relief of hot flashes. Recommend avoidance of hormonal therapy due to age and prolonged use. Recommend increasing Gabapentin dose if hot flashes do not improve. Provided education for treatment of menopausal symptoms. Return in 4 weeks.         Follow up plan: Return in about 4 weeks (around 03/23/2023) for follow up hot flashes.

## 2023-02-23 NOTE — Assessment & Plan Note (Addendum)
Acute. Ongoing. Provided hip exercises. Recommend Voltaren gel use. If no better recommend steroid injection for pain relief. Return in 1 month.

## 2023-02-23 NOTE — Assessment & Plan Note (Addendum)
Chronic, ongoing. Has been off Estrace for 3-4 months now, will start Gabapentin 100 mg at bedtime for relief of hot flashes. Recommend avoidance of hormonal therapy due to age and prolonged use. Recommend increasing Gabapentin dose if hot flashes do not improve. Provided education for treatment of menopausal symptoms. Return in 4 weeks.

## 2023-02-23 NOTE — Progress Notes (Signed)
BP 130/82   Pulse 81   Temp 98.6 F (37 C) (Oral)   Ht 5' 2.5" (1.588 m)   Wt 150 lb 12.8 oz (68.4 kg)   LMP 07/31/1981 (Approximate)   SpO2 97%   BMI 27.14 kg/m    Subjective:    Patient ID: Nancy Mack, female    DOB: December 29, 1945, 77 y.o.   MRN: [id number]  HPI: Nancy Mack is a 77 y.o. female  Chief Complaint  Patient presents with   Hot Flashes    Patient says she would like to discuss different treatment options for hot flashes. Patient says she has stopped Estradiol tablet.    MENOPAUSAL SYMPTOMS Stopped taking Estradiol for 3-4 months due to risk factors associated with prolonged use and age.  Duration: worse Symptom severity: severe Hot flashes: yes Night sweats: yes Sleep disturbances: yes Vaginal dryness: no Dyspareunia:no Decreased libido: no Emotional lability: no Stress incontinence: no Previous HRT/pharmacotherapy: yes; estrace Hysterectomy:  Partial hysterectomy Absolute Contraindications to Hormonal Therapy:     Undiagnosed vaginal bleeding: no    Breast cancer: no    Endometrial cancer: no    Coronary disease: no    Cerebrovascular disease: no    Venous thromboembolic disease: no    Relevant past medical, surgical, family and social history reviewed and updated as indicated. Interim medical history since our last visit reviewed. Allergies and medications reviewed and updated.  Review of Systems  Respiratory: Negative.    Cardiovascular: Negative.   Gastrointestinal: Negative.   Genitourinary:  Negative for dyspareunia, vaginal bleeding and vaginal pain.       Hot Flashes  Musculoskeletal:  Positive for arthralgias.       Complaints of right hip bursitis  Skin: Negative.   Neurological: Negative.   Psychiatric/Behavioral:  Negative for sleep disturbance.     Per HPI unless specifically indicated above     Objective:    BP 130/82   Pulse 81   Temp 98.6 F (37 C) (Oral)   Ht 5' 2.5" (1.588 m)   Wt 150 lb 12.8 oz  (68.4 kg)   LMP 07/31/1981 (Approximate)   SpO2 97%   BMI 27.14 kg/m   Wt Readings from Last 3 Encounters:  02/23/23 150 lb 12.8 oz (68.4 kg)  01/02/23 149 lb 9.6 oz (67.9 kg)  06/13/22 156 lb 12.8 oz (71.1 kg)    Physical Exam Vitals and nursing note reviewed.  Constitutional:      General: She is awake. She is not in acute distress.    Appearance: Normal appearance. She is well-developed and well-groomed. She is not ill-appearing.  HENT:     Head: Normocephalic and atraumatic.     Right Ear: Hearing and external ear normal. No drainage.     Left Ear: Hearing and external ear normal. No drainage.     Nose: Nose normal.  Eyes:     General: Lids are normal.        Right eye: No discharge.        Left eye: No discharge.     Conjunctiva/sclera: Conjunctivae normal.  Cardiovascular:     Rate and Rhythm: Normal rate and regular rhythm.     Heart sounds: Normal heart sounds, S1 normal and S2 normal. No murmur heard.    No gallop.  Pulmonary:     Effort: Pulmonary effort is normal. No accessory muscle usage or respiratory distress.     Breath sounds: Normal breath sounds.  Musculoskeletal:  General: Normal range of motion.     Cervical back: Full passive range of motion without pain and normal range of motion.     Right lower leg: No edema.     Left lower leg: No edema.  Skin:    General: Skin is warm and dry.     Capillary Refill: Capillary refill takes less than 2 seconds.  Neurological:     Mental Status: She is alert and oriented to person, place, and time.  Psychiatric:        Attention and Perception: Attention normal.        Mood and Affect: Mood normal.        Speech: Speech normal.        Behavior: Behavior normal. Behavior is cooperative.        Thought Content: Thought content normal.     Results for orders placed or performed in visit on 01/02/23  CBC with Differential/Platelet  Result Value Ref Range   WBC 6.2 3.4 - 10.8 x10E3/uL   RBC 5.15 3.77 -  5.28 x10E6/uL   Hemoglobin 12.9 11.1 - 15.9 g/dL   Hematocrit 40.6 34.0 - 46.6 %   MCV 79 79 - 97 fL   MCH 25.0 (L) 26.6 - 33.0 pg   MCHC 31.8 31.5 - 35.7 g/dL   RDW 15.0 11.7 - 15.4 %   Platelets 378 150 - 450 x10E3/uL   Neutrophils 58 Not Estab. %   Lymphs 27 Not Estab. %   Monocytes 8 Not Estab. %   Eos 6 Not Estab. %   Basos 1 Not Estab. %   Neutrophils Absolute 3.6 1.4 - 7.0 x10E3/uL   Lymphocytes Absolute 1.7 0.7 - 3.1 x10E3/uL   Monocytes Absolute 0.5 0.1 - 0.9 x10E3/uL   EOS (ABSOLUTE) 0.4 0.0 - 0.4 x10E3/uL   Basophils Absolute 0.1 0.0 - 0.2 x10E3/uL   Immature Granulocytes 0 Not Estab. %   Immature Grans (Abs) 0.0 0.0 - 0.1 x10E3/uL  Comprehensive metabolic panel  Result Value Ref Range   Glucose 125 (H) 70 - 99 mg/dL   BUN 10 8 - 27 mg/dL   Creatinine, Ser 0.92 0.57 - 1.00 mg/dL   eGFR 65 >59 mL/min/1.73   BUN/Creatinine Ratio 11 (L) 12 - 28   Sodium 147 (H) 134 - 144 mmol/L   Potassium 4.1 3.5 - 5.2 mmol/L   Chloride 107 (H) 96 - 106 mmol/L   CO2 24 20 - 29 mmol/L   Calcium 9.7 8.7 - 10.3 mg/dL   Total Protein 6.3 6.0 - 8.5 g/dL   Albumin 4.1 3.8 - 4.8 g/dL   Globulin, Total 2.2 1.5 - 4.5 g/dL   Albumin/Globulin Ratio 1.9 1.2 - 2.2   Bilirubin Total 0.2 0.0 - 1.2 mg/dL   Alkaline Phosphatase 105 44 - 121 IU/L   AST 19 0 - 40 IU/L   ALT 10 0 - 32 IU/L  Lipid Panel w/o Chol/HDL Ratio  Result Value Ref Range   Cholesterol, Total 254 (H) 100 - 199 mg/dL   Triglycerides 172 (H) 0 - 149 mg/dL   HDL 74 >39 mg/dL   VLDL Cholesterol Cal 30 5 - 40 mg/dL   LDL Chol Calc (NIH) 150 (H) 0 - 99 mg/dL  Urinalysis, Routine w reflex microscopic  Result Value Ref Range   Specific Gravity, UA 1.015 1.005 - 1.030   pH, UA 5.5 5.0 - 7.5   Color, UA Yellow Yellow   Appearance Ur Clear Clear   Leukocytes,UA  Negative Negative   Protein,UA Negative Negative/Trace   Glucose, UA Negative Negative   Ketones, UA Negative Negative   RBC, UA Negative Negative   Bilirubin, UA  Negative Negative   Urobilinogen, Ur 0.2 0.2 - 1.0 mg/dL   Nitrite, UA Negative Negative   Microscopic Examination Comment   TSH  Result Value Ref Range   TSH 0.784 0.450 - 4.500 uIU/mL  VITAMIN D 25 Hydroxy (Vit-D Deficiency, Fractures)  Result Value Ref Range   Vit D, 25-Hydroxy 29.0 (L) 30.0 - 100.0 ng/mL  Urine Microalbumin w/creat. ratio  Result Value Ref Range   Creatinine, Urine 61.3 Not Estab. mg/dL   Microalbumin, Urine 7.6 Not Estab. ug/mL   Microalb/Creat Ratio 12 0 - 29 mg/g creat      Assessment & Plan:   Problem List Items Addressed This Visit       Musculoskeletal and Integument   Trochanteric bursitis of right hip    Acute. Ongoing. Provided hip exercises. Recommend Voltaren gel use. If no better recommend steroid injection for pain relief. Return in 1 month.        Other   Menopause - Primary    Chronic, ongoing. Has been off Estrace for 3-4 months now, will start Gabapentin 100 mg at bedtime for relief of hot flashes. Recommend avoidance of hormonal therapy due to age and prolonged use. Recommend increasing Gabapentin dose if hot flashes do not improve. Provided education for treatment of menopausal symptoms. Return in 4 weeks.         Follow up plan: Return in about 4 weeks (around 03/23/2023) for follow up hot flashes.

## 2023-02-27 ENCOUNTER — Encounter: Payer: Medicare Other | Admitting: Physical Therapy

## 2023-03-06 ENCOUNTER — Encounter: Payer: Medicare Other | Admitting: Physical Therapy

## 2023-03-13 ENCOUNTER — Encounter: Payer: Medicare Other | Admitting: Physical Therapy

## 2023-03-20 ENCOUNTER — Ambulatory Visit (INDEPENDENT_AMBULATORY_CARE_PROVIDER_SITE_OTHER): Payer: Medicare Other

## 2023-03-20 VITALS — BP 134/78 | Ht 62.5 in | Wt 147.4 lb

## 2023-03-20 DIAGNOSIS — Z0000 Encounter for general adult medical examination without abnormal findings: Secondary | ICD-10-CM | POA: Diagnosis not present

## 2023-03-20 DIAGNOSIS — Z1231 Encounter for screening mammogram for malignant neoplasm of breast: Secondary | ICD-10-CM

## 2023-03-20 NOTE — Progress Notes (Signed)
Subjective:   Nancy Mack is a 77 y.o. female who presents for Medicare Annual (Subsequent) preventive examination.  Review of Systems     Cardiac Risk Factors include: advanced age (>84men, >84 women);hypertension;sedentary lifestyle;family history of premature cardiovascular disease     Objective:    Today's Vitals   03/20/23 0850  BP: 134/78  Weight: 147 lb 6.4 oz (66.9 kg)  Height: 5' 2.5" (1.588 m)   Body mass index is 26.53 kg/m.     03/20/2023    9:04 AM 03/20/2023    8:57 AM 03/06/2022    8:25 AM 02/22/2022   10:46 AM 02/10/2022    9:32 AM 03/04/2021    8:19 AM 02/23/2020    2:50 PM  Advanced Directives  Does Patient Have a Medical Advance Directive? No No No No No No No  Would patient like information on creating a medical advance directive? No - Patient declined No - Patient declined No - Patient declined No - Patient declined       Current Medications (verified) Outpatient Encounter Medications as of 03/20/2023  Medication Sig   acetaminophen (TYLENOL) 500 MG tablet Take 1,000 mg by mouth every 6 (six) hours as needed.   albuterol (VENTOLIN HFA) 108 (90 Base) MCG/ACT inhaler Inhale 2 puffs into the lungs every 6 (six) hours as needed.   Ascorbic Acid (VITAMIN C) 100 MG tablet Take 100 mg by mouth daily.   atorvastatin (LIPITOR) 80 MG tablet Take 1 tablet (80 mg total) by mouth daily.   azelastine (ASTELIN) 0.1 % nasal spray Place 1 spray 2 (two) times daily into both nostrils. Use in each nostril as directed   Biotin 1000 MCG tablet Take 1,000 mcg by mouth daily.   Cholecalciferol (VITAMIN D) 50 MCG (2000 UT) tablet Take 2,000 Units by mouth daily.   docusate sodium (COLACE) 100 MG capsule Take 1 capsule (100 mg total) by mouth 2 (two) times daily.   fexofenadine (ALLEGRA) 180 MG tablet Take 1 tablet (180 mg total) by mouth daily.   fluticasone (FLONASE) 50 MCG/ACT nasal spray Place 2 sprays into both nostrils daily. (Patient taking differently: Place 1 spray  into both nostrils in the morning and at bedtime.)   Fluticasone-Umeclidin-Vilant (TRELEGY ELLIPTA) 100-62.5-25 MCG/ACT AEPB Inhale 1 puff into the lungs daily.   montelukast (SINGULAIR) 10 MG tablet Take 1 tablet (10 mg total) by mouth at bedtime.   triamcinolone cream (KENALOG) 0.1 % Apply 1 application topically 2 (two) times daily. (Patient taking differently: Apply 1 application  topically 2 (two) times daily as needed (irritation).)   vitamin B-12 (CYANOCOBALAMIN) 1000 MCG tablet Take 1,000 mcg by mouth daily.   aspirin EC 325 MG tablet Take 1 tablet (325 mg total) by mouth daily. (Patient not taking: Reported on 03/20/2023)   gabapentin (NEURONTIN) 100 MG capsule Take 1-2 capsules (100-200 mg total) by mouth 3 (three) times daily. (Patient not taking: Reported on 03/20/2023)   No facility-administered encounter medications on file as of 03/20/2023.    Allergies (verified) Azithromycin, Lactose intolerance (gi), Prednisone, and Tape   History: Past Medical History:  Diagnosis Date   Allergy    Anemia    Arthritis    Asthma    Carpal tunnel syndrome    Chronic kidney disease    Dyspnea    Eczema    Globus pharyngeus    Hyperlipidemia    Hypertension    Laryngopharyngeal reflux    Menopause    Neuroma    right  foot   Osteoporosis    Pneumonia    Raynaud's disease    Rhinitis due to pollen    SOB (shortness of breath)    Past Surgical History:  Procedure Laterality Date   COLONOSCOPY WITH PROPOFOL N/A 03/04/2019   Procedure: COLONOSCOPY WITH PROPOFOL;  Surgeon: [name], MD;  Location: [facility] ENDOSCOPY;  Service: Endoscopy;  Laterality: N/A;   EYE SURGERY Left 2015   broken blood veesel, Dr.Matthews    EYE SURGERY Bilateral 2017   In [place]    FOOT NEUROMA SURGERY Right    PARTIAL HYSTERECTOMY     SHOULDER ARTHROSCOPY WITH BICEPSTENOTOMY Right 04/08/2018   Procedure: SHOULDER ARTHROSCOPY WITH BICEPSTENOTOMY;  Surgeon: Lovell Sheehan, MD;  Location: [facility] ORS;   Service: Orthopedics;  Laterality: Right;   TOTAL SHOULDER ARTHROPLASTY Left 02/22/2022   Procedure: TOTAL SHOULDER ARTHROPLASTY;  Surgeon: Lovell Sheehan, MD;  Location: [facility] ORS;  Service: Orthopedics;  Laterality: Left;   Family History  Problem Relation Age of Onset   Arthritis Mother    Heart failure Mother    Hypertension Mother    Hyperlipidemia Mother    Heart disease Mother    Dementia Mother    Asthma Father    Aneurysm Sister    Lung cancer Son    Liver cancer Son    Alzheimer's disease Maternal Grandmother    Social History   Socioeconomic History   Marital status: Married    Spouse name: Fritz Pickerel   Number of children: Not on file   Years of education: Not on file   Highest education level: Bachelor's degree (e.g., BA, AB, BS)  Occupational History   Occupation: retired  Tobacco Use   Smoking status: Former    Packs/day: 2.00    Years: 2.00    Additional pack years: 0.00    Total pack years: 4.00    Types: Cigarettes    Quit date: 12/18/1978    Years since quitting: 44.2   Smokeless tobacco: Never   Tobacco comments:    quit in [place] Use   Vaping Use: Never used  Substance and Sexual Activity   Alcohol use: Not Currently    Alcohol/week: 0.0 standard drinks of alcohol   Drug use: No   Sexual activity: Yes  Other Topics Concern   Not on file  Social History Narrative   Attends church   Social Determinants of Health   Financial Resource Strain: Low Risk  (03/20/2023)   Overall Financial Resource Strain (CARDIA)    Difficulty of Paying Living Expenses: Not hard at all  Food Insecurity: No Food Insecurity (03/20/2023)   Hunger Vital Sign    Worried About Running Out of Food in the Last Year: Never true    Ran Out of Food in the Last Year: Never true  Transportation Needs: No Transportation Needs (03/20/2023)   PRAPARE - Hydrologist (Medical): No    Lack of Transportation (Non-Medical): No  Physical Activity: Inactive  (03/20/2023)   Exercise Vital Sign    Days of Exercise per Week: 0 days    Minutes of Exercise per Session: 0 min  Stress: No Stress Concern Present (03/20/2023)   [place]    Feeling of Stress : Only a little  Social Connections: Socially Integrated (03/20/2023)   Social Connection and Isolation Panel [NHANES]    Frequency of Communication with Friends and Family: More than three times a week  Frequency of Social Gatherings with Friends and Family: Twice a week    Attends Religious Services: More than 4 times per year    Active Member of [employer] or Organizations: Yes    Attends Music therapist: More than 4 times per year    Marital Status: Married    Tobacco Counseling Counseling given: Not Answered Tobacco comments: quit in [place]:  Pre-visit preparation completed: Yes  Pain : No/denies pain     Nutritional Risks: None Diabetes: No  How often do you need to have someone help you when you read instructions, pamphlets, or other written materials from your doctor or pharmacy?: 1 - Never  Diabetic?no  Interpreter Needed?: No  Information entered by :: Kirke Shaggy, LPN   Activities of Daily Living    03/20/2023    9:06 AM  In your present state of health, do you have any difficulty performing the following activities:  Hearing? 0  Vision? 0  Difficulty concentrating or making decisions? 0  Walking or climbing stairs? 1  Dressing or bathing? 0  Doing errands, shopping? 0  Preparing Food and eating ? N  Using the Toilet? N  In the past six months, have you accidently leaked urine? N  Do you have problems with loss of bowel control? N  Managing your Medications? N  Managing your Finances? N  Housekeeping or managing your Housekeeping? N    Patient Care Team: Valerie Roys, DO as PCP - General (Family Medicine) Kathrine Haddock, NP (Nurse Practitioner) [name],  MD as Consulting Physician (Gastroenterology) Clyde Canterbury, MD as Referring Physician (Otolaryngology) [employer] [facility])  Indicate any recent Medical Services you may have received from other than [facility] providers in the past year (date may be approximate).     Assessment:   This is a routine wellness examination for Nancy Mack.  Hearing/Vision screen Hearing Screening - Comments:: No aids Vision Screening - Comments:: Wears glasses- Dr. Marvel Plan  Dietary issues and exercise activities discussed: Current Exercise Habits: The patient does not participate in regular exercise at present   Goals Addressed             This Visit's Progress    DIET - EAT MORE FRUITS AND VEGETABLES         Depression Screen    03/20/2023    9:02 AM 01/02/2023    2:54 PM 06/13/2022    8:30 AM 03/06/2022    8:25 AM 12/13/2021    9:39 AM 05/04/2021    8:14 AM 03/04/2021    8:20 AM  PHQ 2/9 Scores  PHQ - 2 Score 0 0 0 0 0 0 0  PHQ- 9 Score 0 0 0  0      Fall Risk    03/20/2023    8:59 AM 03/06/2022    8:24 AM 12/13/2021    9:38 AM 05/04/2021    8:13 AM 03/04/2021    8:19 AM  Fall Risk   Falls in the past year? 0 0 0 0 0  Number falls in past yr: 0 0 0 0   Injury with Fall? 0 0 0 0   Risk for fall due to : No Fall Risks  No Fall Risks No Fall Risks Medication side effect  Follow up Falls prevention discussed;Falls evaluation completed Falls evaluation completed;Education provided Falls evaluation completed Falls evaluation completed Falls evaluation completed;Education provided;Falls prevention discussed    FALL RISK PREVENTION PERTAINING TO THE HOME:  Any stairs in  or around the home? No  If so, are there any without handrails? No  Home free of loose throw rugs in walkways, pet beds, electrical cords, etc? Yes  Adequate lighting in your home to reduce risk of falls? Yes   ASSISTIVE DEVICES UTILIZED TO PREVENT FALLS:  Life alert? No  Use of a cane, walker or w/c? No  Grab bars  in the bathroom? No  Shower chair or bench in shower? Yes  Elevated toilet seat or a handicapped toilet? Yes   TIMED UP AND GO:  Was the test performed? Yes .  Length of time to ambulate 10 feet: 3 sec.   Gait steady and fast without use of assistive device  Cognitive Function:        03/20/2023    9:14 AM 03/04/2021    8:21 AM 05/03/2020    8:37 AM 05/03/2020    8:36 AM 02/18/2018    8:23 AM  6CIT Screen  What Year? 0 points 0 points 0 points 0 points 0 points  What month? 0 points 0 points 0 points 0 points 0 points  What time? 0 points 0 points 0 points 0 points 0 points  Count back from 20 0 points 0 points 0 points  0 points  Months in reverse 0 points 0 points 0 points  0 points  Repeat phrase 0 points 0 points 2 points  2 points  Total Score 0 points 0 points 2 points  2 points    Immunizations Immunization History  Administered Date(s) Administered   COVID-19, mRNA, vaccine(Comirnaty)12 years and older 10/16/2022   Fluad Quad(high Dose 65+) 09/26/2019, 09/14/2020, 09/05/2021, 09/18/2022   Influenza, High Dose Seasonal PF 09/21/2016, 10/02/2017, 10/23/2018   Influenza,inj,Quad PF,6+ Mos 10/01/2015   Influenza-Unspecified 09/10/2012, 09/15/2014, 09/21/2016   Moderna Sars-Covid-2 Vaccination 01/26/2020, 02/25/2020   PFIZER(Purple Top)SARS-COV-2 Vaccination 11/09/2020   Pneumococcal Conjugate-13 01/22/2015   Pneumococcal Polysaccharide-23 06/05/2012   Pneumococcal-Unspecified 10/19/2014   Td 08/26/2009, 05/03/2020   Td (Adult), 2 Lf Tetanus Toxid, Preservative Free 08/26/2009, 05/03/2020   Zoster Recombinat (Shingrix) 08/12/2019, 11/12/2019   Zoster, Live 03/13/2007    TDAP status: Up to date  Flu Vaccine status: Up to date  Pneumococcal vaccine status: Up to date  Covid-19 vaccine status: Completed vaccines  Qualifies for Shingles Vaccine? Yes   Zostavax completed Yes   Shingrix Completed?: Yes  Screening Tests Health Maintenance  Topic Date Due    COVID-19 Vaccine (5 - 2023-24 season) 12/11/2022   INFLUENZA VACCINE  07/19/2023   Medicare Annual Wellness (AWV)  03/19/2024   DTaP/Tdap/Td (5 - Tdap) 05/03/2030   Pneumonia Vaccine 57+ Years old  Completed   DEXA SCAN  Completed   Hepatitis C Screening  Completed   Zoster Vaccines- Shingrix  Completed   HPV VACCINES  Aged Out   COLONOSCOPY (Pts 45-6yrs Insurance coverage will need to be confirmed)  Discontinued    Health Maintenance  Health Maintenance Due  Topic Date Due   COVID-19 Vaccine (5 - 2023-24 season) 12/11/2022    Colorectal cancer screening: No longer required.   Mammogram status: Completed states had one last October. Repeat every year- referral sent  Bone Density status: Completed 09/21/22. Results reflect: Bone density results: NORMAL. Repeat every 5 years.  Lung Cancer Screening: (Low Dose CT Chest recommended if Age 41-80 years, 30 pack-year currently smoking OR have quit w/in 15years.) does not qualify.   Additional Screening:  Hepatitis C Screening: does qualify; Completed 11/05/15  Vision Screening: Recommended annual ophthalmology  exams for early detection of glaucoma and other disorders of the eye. Is the patient up to date with their annual eye exam?  Yes  Who is the provider or what is the name of the office in which the patient attends annual eye exams? Dr.Richardson If pt is not established with a provider, would they like to be referred to a provider to establish care? No .   Dental Screening: Recommended annual dental exams for proper oral hygiene  Community Resource Referral / Chronic Care Management: CRR required this visit?  No   CCM required this visit?  No      Plan:     I have personally reviewed and noted the following in the patient's chart:   Medical and social history Use of alcohol, tobacco or illicit drugs  Current medications and supplements including opioid prescriptions. Patient is not currently taking opioid  prescriptions. Functional ability and status Nutritional status Physical activity Advanced directives List of other physicians Hospitalizations, surgeries, and ER visits in previous 12 months Vitals Screenings to include cognitive, depression, and falls Referrals and appointments  In addition, I have reviewed and discussed with patient certain preventive protocols, quality metrics, and best practice recommendations. A written personalized care plan for preventive services as well as general preventive health recommendations were provided to patient.     Dionisio David, LPN   [id number]   Nurse Notes: none

## 2023-03-20 NOTE — Patient Instructions (Signed)
Nancy Mack , Thank you for taking time to come for your Medicare Wellness Visit. I appreciate your ongoing commitment to your health goals. Please review the following plan we discussed and let me know if I can assist you in the future.   These are the goals we discussed:  Goals      DIET - EAT MORE FRUITS AND VEGETABLES     DIET - INCREASE WATER INTAKE     Recommend drinking at least 6-8 glasses of water a day.      Patient Stated     03/04/2021, wants to weigh 125 pounds     Patient Stated     Loose weight        This is a list of the screening recommended for you and due dates:  Health Maintenance  Topic Date Due   COVID-19 Vaccine (5 - 2023-24 season) 12/11/2022   Flu Shot  07/19/2023   Medicare Annual Wellness Visit  03/19/2024   DTaP/Tdap/Td vaccine (5 - Tdap) 05/03/2030   Pneumonia Vaccine  Completed   DEXA scan (bone density measurement)  Completed   Hepatitis C Screening: USPSTF Recommendation to screen - Ages 18-79 yo.  Completed   Zoster (Shingles) Vaccine  Completed   HPV Vaccine  Aged Out   Colon Cancer Screening  Discontinued    Advanced directives: no  Conditions/risks identified: none  Next appointment: Follow up in one year for your annual wellness visit 03/24/24 @ 8:45 am in person   Preventive Care 65 Years and Older, Female Preventive care refers to lifestyle choices and visits with your health care provider that can promote health and wellness. What does preventive care include? A yearly physical exam. This is also called an annual well check. Dental exams once or twice a year. Routine eye exams. Ask your health care provider how often you should have your eyes checked. Personal lifestyle choices, including: Daily care of your teeth and gums. Regular physical activity. Eating a healthy diet. Avoiding tobacco and drug use. Limiting alcohol use. Practicing safe sex. Taking low-dose aspirin every day. Taking vitamin and mineral supplements as  recommended by your health care provider. What happens during an annual well check? The services and screenings done by your health care provider during your annual well check will depend on your age, overall health, lifestyle risk factors, and family history of disease. Counseling  Your health care provider may ask you questions about your: Alcohol use. Tobacco use. Drug use. Emotional well-being. Home and relationship well-being. Sexual activity. Eating habits. History of falls. Memory and ability to understand (cognition). Work and work Statistician. Reproductive health. Screening  You may have the following tests or measurements: Height, weight, and BMI. Blood pressure. Lipid and cholesterol levels. These may be checked every 5 years, or more frequently if you are over 68 years old. Skin check. Lung cancer screening. You may have this screening every year starting at age 51 if you have a 30-pack-year history of smoking and currently smoke or have quit within the past 15 years. Fecal occult blood test (FOBT) of the stool. You may have this test every year starting at age 44. Flexible sigmoidoscopy or colonoscopy. You may have a sigmoidoscopy every 5 years or a colonoscopy every 10 years starting at age 14. Hepatitis C blood test. Hepatitis B blood test. Sexually transmitted disease (STD) testing. Diabetes screening. This is done by checking your blood sugar (glucose) after you have not eaten for a while (fasting). You may have this  done every 1-3 years. Bone density scan. This is done to screen for osteoporosis. You may have this done starting at age 21. Mammogram. This may be done every 1-2 years. Talk to your health care provider about how often you should have regular mammograms. Talk with your health care provider about your test results, treatment options, and if necessary, the need for more tests. Vaccines  Your health care provider may recommend certain vaccines, such  as: Influenza vaccine. This is recommended every year. Tetanus, diphtheria, and acellular pertussis (Tdap, Td) vaccine. You may need a Td booster every 10 years. Zoster vaccine. You may need this after age 51. Pneumococcal 13-valent conjugate (PCV13) vaccine. One dose is recommended after age 58. Pneumococcal polysaccharide (PPSV23) vaccine. One dose is recommended after age 50. Talk to your health care provider about which screenings and vaccines you need and how often you need them. This information is not intended to replace advice given to you by your health care provider. Make sure you discuss any questions you have with your health care provider. Document Released: 12/31/2015 Document Revised: 08/23/2016 Document Reviewed: 10/05/2015 Elsevier Interactive Patient Education  2017 [employer] Prevention in the Home Falls can cause injuries. They can happen to people of all ages. There are many things you can do to make your home safe and to help prevent falls. What can I do on the outside of my home? Regularly fix the edges of walkways and driveways and fix any cracks. Remove anything that might make you trip as you walk through a door, such as a raised step or threshold. Trim any bushes or trees on the path to your home. Use bright outdoor lighting. Clear any walking paths of anything that might make someone trip, such as rocks or tools. Regularly check to see if handrails are loose or broken. Make sure that both sides of any steps have handrails. Any raised decks and porches should have guardrails on the edges. Have any leaves, snow, or ice cleared regularly. Use sand or salt on walking paths during winter. Clean up any spills in your garage right away. This includes oil or grease spills. What can I do in the bathroom? Use night lights. Install grab bars by the toilet and in the tub and shower. Do not use towel bars as grab bars. Use non-skid mats or decals in the tub or  shower. If you need to sit down in the shower, use a plastic, non-slip stool. Keep the floor dry. Clean up any water that spills on the floor as soon as it happens. Remove soap buildup in the tub or shower regularly. Attach bath mats securely with double-sided non-slip rug tape. Do not have throw rugs and other things on the floor that can make you trip. What can I do in the bedroom? Use night lights. Make sure that you have a light by your bed that is easy to reach. Do not use any sheets or blankets that are too big for your bed. They should not hang down onto the floor. Have a firm chair that has side arms. You can use this for support while you get dressed. Do not have throw rugs and other things on the floor that can make you trip. What can I do in the kitchen? Clean up any spills right away. Avoid walking on wet floors. Keep items that you use a lot in easy-to-reach places. If you need to reach something above you, use a strong step stool that  has a grab bar. Keep electrical cords out of the way. Do not use floor polish or wax that makes floors slippery. If you must use wax, use non-skid floor wax. Do not have throw rugs and other things on the floor that can make you trip. What can I do with my stairs? Do not leave any items on the stairs. Make sure that there are handrails on both sides of the stairs and use them. Fix handrails that are broken or loose. Make sure that handrails are as long as the stairways. Check any carpeting to make sure that it is firmly attached to the stairs. Fix any carpet that is loose or worn. Avoid having throw rugs at the top or bottom of the stairs. If you do have throw rugs, attach them to the floor with carpet tape. Make sure that you have a light switch at the top of the stairs and the bottom of the stairs. If you do not have them, ask someone to add them for you. What else can I do to help prevent falls? Wear shoes that: Do not have high heels. Have  rubber bottoms. Are comfortable and fit you well. Are closed at the toe. Do not wear sandals. If you use a stepladder: Make sure that it is fully opened. Do not climb a closed stepladder. Make sure that both sides of the stepladder are locked into place. Ask someone to hold it for you, if possible. Clearly mark and make sure that you can see: Any grab bars or handrails. First and last steps. Where the edge of each step is. Use tools that help you move around (mobility aids) if they are needed. These include: Canes. Walkers. Scooters. Crutches. Turn on the lights when you go into a dark area. Replace any light bulbs as soon as they burn out. Set up your furniture so you have a clear path. Avoid moving your furniture around. If any of your floors are uneven, fix them. If there are any pets around you, be aware of where they are. Review your medicines with your doctor. Some medicines can make you feel dizzy. This can increase your chance of falling. Ask your doctor what other things that you can do to help prevent falls. This information is not intended to replace advice given to you by your health care provider. Make sure you discuss any questions you have with your health care provider. Document Released: 09/30/2009 Document Revised: 05/11/2016 Document Reviewed: 01/08/2015 Elsevier Interactive Patient Education  2017 [employer].

## 2023-03-30 ENCOUNTER — Encounter: Payer: Self-pay | Admitting: Family Medicine

## 2023-03-30 ENCOUNTER — Ambulatory Visit (INDEPENDENT_AMBULATORY_CARE_PROVIDER_SITE_OTHER): Payer: Medicare Other | Admitting: Family Medicine

## 2023-03-30 VITALS — BP 119/76 | HR 85 | Temp 98.7°F | Ht 62.5 in | Wt 153.8 lb

## 2023-03-30 DIAGNOSIS — Z780 Asymptomatic menopausal state: Secondary | ICD-10-CM

## 2023-03-30 DIAGNOSIS — M7062 Trochanteric bursitis, left hip: Secondary | ICD-10-CM | POA: Diagnosis not present

## 2023-03-30 MED ORDER — VENLAFAXINE HCL ER 37.5 MG PO CP24: 37.5000 mg | ORAL_CAPSULE | Freq: Every day | ORAL | 3 refills | Status: DC

## 2023-03-30 NOTE — Assessment & Plan Note (Signed)
Injection given today as below. Call with any concerns.

## 2023-03-30 NOTE — Assessment & Plan Note (Signed)
Unable to tolerate gabapentin. Will start her on effexor. Recheck 4-6 weeks.

## 2023-03-30 NOTE — Progress Notes (Signed)
BP 119/76   Pulse 85   Temp 98.7 F (37.1 C) (Oral)   Ht 5' 2.5" (1.588 m)   Wt 153 lb 12.8 oz (69.8 kg)   LMP 07/31/1981 (Approximate)   SpO2 96%   BMI 27.68 kg/m    Subjective:    Patient ID: Nancy Mack, female    DOB: 03-04-1946, 77 y.o.   MRN: [id number]  HPI: Nancy Mack is a 77 y.o. female  Chief Complaint  Patient presents with   Hot Flashes    Patient says she cannot take the prescription prescribed as the medication knocked her out.    Hip Pain    Patient says her hip pain is worse than it was at her last visit and she says it is to the point she has trouble getting up from the sitting position.    She notes that she didn't like the gabapentin because it made her too sleepy and she couldn't hear what was going on in her house at night. She is still having night sweats and notes that she wakes up drenched. Having fewer hot flashes during the day.  HIP PAIN Duration: months Involved hip: left  Mechanism of injury: unknown Location: lateral Onset: gradual  Severity: severe  Quality: sharp and aching Frequency: with movement Radiation: down her leg Aggravating factors: standing up and moving   Alleviating factors: rest  Status: worse Treatments attempted: voltaren and stretches   Relief with NSAIDs?: no Weakness with weight bearing: no Weakness with walking: no Paresthesias / decreased sensation: no Swelling: no Redness:no Fevers: no   Relevant past medical, surgical, family and social history reviewed and updated as indicated. Interim medical history since our last visit reviewed. Allergies and medications reviewed and updated.  Review of Systems  Constitutional: Negative.   Respiratory: Negative.    Cardiovascular: Negative.   Gastrointestinal: Negative.   Musculoskeletal:  Positive for arthralgias. Negative for back pain, gait problem, joint swelling, myalgias, neck pain and neck stiffness.  Skin: Negative.   Neurological:  Negative.   Psychiatric/Behavioral: Negative.      Per HPI unless specifically indicated above     Objective:    BP 119/76   Pulse 85   Temp 98.7 F (37.1 C) (Oral)   Ht 5' 2.5" (1.588 m)   Wt 153 lb 12.8 oz (69.8 kg)   LMP 07/31/1981 (Approximate)   SpO2 96%   BMI 27.68 kg/m   Wt Readings from Last 3 Encounters:  03/30/23 153 lb 12.8 oz (69.8 kg)  03/20/23 147 lb 6.4 oz (66.9 kg)  02/23/23 150 lb 12.8 oz (68.4 kg)    Physical Exam Vitals and nursing note reviewed.  Constitutional:      General: She is not in acute distress.    Appearance: Normal appearance. She is not ill-appearing, toxic-appearing or diaphoretic.  HENT:     Head: Normocephalic and atraumatic.     Right Ear: External ear normal.     Left Ear: External ear normal.     Nose: Nose normal.     Mouth/Throat:     Mouth: Mucous membranes are moist.     Pharynx: Oropharynx is clear.  Eyes:     General: No scleral icterus.       Right eye: No discharge.        Left eye: No discharge.     Extraocular Movements: Extraocular movements intact.     Conjunctiva/sclera: Conjunctivae normal.     Pupils: Pupils are equal, round, and  reactive to light.  Cardiovascular:     Rate and Rhythm: Normal rate and regular rhythm.     Pulses: Normal pulses.     Heart sounds: Normal heart sounds. No murmur heard.    No friction rub. No gallop.  Pulmonary:     Effort: Pulmonary effort is normal. No respiratory distress.     Breath sounds: Normal breath sounds. No stridor. No wheezing, rhonchi or rales.  Chest:     Chest wall: No tenderness.  Musculoskeletal:        General: Normal range of motion.     Cervical back: Normal range of motion and neck supple.     Comments: Tenderness over L trochanteric bursa  Skin:    General: Skin is warm and dry.     Capillary Refill: Capillary refill takes less than 2 seconds.     Coloration: Skin is not jaundiced or pale.     Findings: No bruising, erythema, lesion or rash.   Neurological:     General: No focal deficit present.     Mental Status: She is alert and oriented to person, place, and time. Mental status is at baseline.  Psychiatric:        Mood and Affect: Mood normal.        Behavior: Behavior normal.        Thought Content: Thought content normal.        Judgment: Judgment normal.     Results for orders placed or performed in visit on 01/02/23  CBC with Differential/Platelet  Result Value Ref Range   WBC 6.2 3.4 - 10.8 x10E3/uL   RBC 5.15 3.77 - 5.28 x10E6/uL   Hemoglobin 12.9 11.1 - 15.9 g/dL   Hematocrit 04.5 99.7 - 46.6 %   MCV 79 79 - 97 fL   MCH 25.0 (L) 26.6 - 33.0 pg   MCHC 31.8 31.5 - 35.7 g/dL   RDW 74.1 42.3 - 95.3 %   Platelets 378 150 - 450 x10E3/uL   Neutrophils 58 Not Estab. %   Lymphs 27 Not Estab. %   Monocytes 8 Not Estab. %   Eos 6 Not Estab. %   Basos 1 Not Estab. %   Neutrophils Absolute 3.6 1.4 - 7.0 x10E3/uL   Lymphocytes Absolute 1.7 0.7 - 3.1 x10E3/uL   Monocytes Absolute 0.5 0.1 - 0.9 x10E3/uL   EOS (ABSOLUTE) 0.4 0.0 - 0.4 x10E3/uL   Basophils Absolute 0.1 0.0 - 0.2 x10E3/uL   Immature Granulocytes 0 Not Estab. %   Immature Grans (Abs) 0.0 0.0 - 0.1 x10E3/uL  Comprehensive metabolic panel  Result Value Ref Range   Glucose 125 (H) 70 - 99 mg/dL   BUN 10 8 - 27 mg/dL   Creatinine, Ser 2.02 0.57 - 1.00 mg/dL   eGFR 65 >33 ID/HWY/6.16   BUN/Creatinine Ratio 11 (L) 12 - 28   Sodium 147 (H) 134 - 144 mmol/L   Potassium 4.1 3.5 - 5.2 mmol/L   Chloride 107 (H) 96 - 106 mmol/L   CO2 24 20 - 29 mmol/L   Calcium 9.7 8.7 - 10.3 mg/dL   Total Protein 6.3 6.0 - 8.5 g/dL   Albumin 4.1 3.8 - 4.8 g/dL   Globulin, Total 2.2 1.5 - 4.5 g/dL   Albumin/Globulin Ratio 1.9 1.2 - 2.2   Bilirubin Total 0.2 0.0 - 1.2 mg/dL   Alkaline Phosphatase 105 44 - 121 IU/L   AST 19 0 - 40 IU/L   ALT 10 0 - 32 IU/L  Lipid Panel w/o Chol/HDL Ratio  Result Value Ref Range   Cholesterol, Total 254 (H) 100 - 199 mg/dL    Triglycerides 409 (H) 0 - 149 mg/dL   HDL 74 >81 mg/dL   VLDL Cholesterol Cal 30 5 - 40 mg/dL   LDL Chol Calc (NIH) 191 (H) 0 - 99 mg/dL  Urinalysis, Routine w reflex microscopic  Result Value Ref Range   Specific Gravity, UA 1.015 1.005 - 1.030   pH, UA 5.5 5.0 - 7.5   Color, UA Yellow Yellow   Appearance Ur Clear Clear   Leukocytes,UA Negative Negative   Protein,UA Negative Negative/Trace   Glucose, UA Negative Negative   Ketones, UA Negative Negative   RBC, UA Negative Negative   Bilirubin, UA Negative Negative   Urobilinogen, Ur 0.2 0.2 - 1.0 mg/dL   Nitrite, UA Negative Negative   Microscopic Examination Comment   TSH  Result Value Ref Range   TSH 0.784 0.450 - 4.500 uIU/mL  VITAMIN D 25 Hydroxy (Vit-D Deficiency, Fractures)  Result Value Ref Range   Vit D, 25-Hydroxy 29.0 (L) 30.0 - 100.0 ng/mL  Urine Microalbumin w/creat. ratio  Result Value Ref Range   Creatinine, Urine 61.3 Not Estab. mg/dL   Microalbumin, Urine 7.6 Not Estab. ug/mL   Microalb/Creat Ratio 12 0 - 29 mg/g creat      Assessment & Plan:   Problem List Items Addressed This Visit       Musculoskeletal and Integument   Trochanteric bursitis of left hip    Injection given today as below. Call with any concerns.         Other   Menopause - Primary    Unable to tolerate gabapentin. Will start her on effexor. Recheck 4-6 weeks.        Procedure: Left Trochanteric Bursitis Steroid Injection        Diagnosis:   ICD-10-CM   1. Menopause  Z78.0     2. Trochanteric bursitis of left hip  M70.62       Physician: MJ Consent:  Risks, benefits, and alternative treatments discussed and all questions were answered.  Patient elected to proceed and verbal consent obtained.  Description: Area prepped and draped using  semi-sterile technique.  After identifying area of maximal tenderness, A mixture of 2cc 1% lidocaine and 1cc Kenalog 40 was injected.  A bandage was then placed over the injection  site. Complications: none Post Procedure Instructions: Wound care instructions discussed and patient was instructed to keep area clean and dry.  Signs and symptoms of infection discussed, patient agrees to contact the office ASAP should they occur.  Follow Up: Return 4-6 weeks follow up hot flashes.  Follow up plan: Return 4-6 weeks follow up hot flashes.

## 2023-04-05 ENCOUNTER — Other Ambulatory Visit: Payer: Self-pay | Admitting: Family Medicine

## 2023-04-06 NOTE — Telephone Encounter (Signed)
Refused Singulair 10 mg because being requested too soon.

## 2023-04-13 DIAGNOSIS — E782 Mixed hyperlipidemia: Secondary | ICD-10-CM | POA: Diagnosis not present

## 2023-04-13 DIAGNOSIS — I700 Atherosclerosis of aorta: Secondary | ICD-10-CM | POA: Diagnosis not present

## 2023-04-13 DIAGNOSIS — I10 Essential (primary) hypertension: Secondary | ICD-10-CM | POA: Diagnosis not present

## 2023-04-13 DIAGNOSIS — R0602 Shortness of breath: Secondary | ICD-10-CM | POA: Diagnosis not present

## 2023-04-13 DIAGNOSIS — R002 Palpitations: Secondary | ICD-10-CM | POA: Diagnosis not present

## 2023-04-23 ENCOUNTER — Other Ambulatory Visit: Payer: Self-pay | Admitting: Family Medicine

## 2023-04-23 DIAGNOSIS — J849 Interstitial pulmonary disease, unspecified: Secondary | ICD-10-CM | POA: Diagnosis not present

## 2023-04-23 DIAGNOSIS — R0602 Shortness of breath: Secondary | ICD-10-CM | POA: Diagnosis not present

## 2023-04-24 NOTE — Telephone Encounter (Signed)
Refused HCTZ 25 mg because it was discontinued 12/13/2021.   Not on medication list either.

## 2023-04-26 DIAGNOSIS — H40009 Preglaucoma, unspecified, unspecified eye: Secondary | ICD-10-CM | POA: Diagnosis not present

## 2023-05-09 DIAGNOSIS — H40009 Preglaucoma, unspecified, unspecified eye: Secondary | ICD-10-CM | POA: Diagnosis not present

## 2023-07-03 ENCOUNTER — Ambulatory Visit: Payer: Medicare Other | Admitting: Family Medicine

## 2023-07-05 ENCOUNTER — Other Ambulatory Visit: Payer: Self-pay | Admitting: Pulmonary Disease

## 2023-07-05 DIAGNOSIS — J849 Interstitial pulmonary disease, unspecified: Secondary | ICD-10-CM

## 2023-07-05 DIAGNOSIS — R0602 Shortness of breath: Secondary | ICD-10-CM

## 2023-07-23 ENCOUNTER — Ambulatory Visit
Admission: RE | Admit: 2023-07-23 | Discharge: 2023-07-23 | Disposition: A | Discharge status: Home / Self Care | Payer: Medicare Other | Source: Ambulatory Visit | Attending: Pulmonary Disease | Admitting: Pulmonary Disease

## 2023-07-23 DIAGNOSIS — R0602 Shortness of breath: Secondary | ICD-10-CM | POA: Diagnosis not present

## 2023-07-23 DIAGNOSIS — J849 Interstitial pulmonary disease, unspecified: Secondary | ICD-10-CM | POA: Insufficient documentation

## 2023-07-23 DIAGNOSIS — R918 Other nonspecific abnormal finding of lung field: Secondary | ICD-10-CM | POA: Diagnosis not present

## 2023-07-27 DIAGNOSIS — R0609 Other forms of dyspnea: Secondary | ICD-10-CM | POA: Diagnosis not present

## 2023-07-27 DIAGNOSIS — J811 Chronic pulmonary edema: Secondary | ICD-10-CM | POA: Diagnosis not present

## 2023-08-21 DIAGNOSIS — J849 Interstitial pulmonary disease, unspecified: Secondary | ICD-10-CM | POA: Diagnosis not present

## 2023-08-27 DIAGNOSIS — J849 Interstitial pulmonary disease, unspecified: Secondary | ICD-10-CM | POA: Diagnosis not present

## 2023-09-04 DIAGNOSIS — J849 Interstitial pulmonary disease, unspecified: Secondary | ICD-10-CM | POA: Diagnosis not present

## 2023-09-18 DIAGNOSIS — H40009 Preglaucoma, unspecified, unspecified eye: Secondary | ICD-10-CM | POA: Diagnosis not present

## 2023-09-24 ENCOUNTER — Other Ambulatory Visit
Admission: RE | Admit: 2023-09-24 | Discharge: 2023-09-24 | Disposition: A | Discharge status: Home / Self Care | Payer: Medicare Other | Source: Ambulatory Visit | Attending: Nurse Practitioner | Admitting: Nurse Practitioner

## 2023-09-24 DIAGNOSIS — R0602 Shortness of breath: Secondary | ICD-10-CM | POA: Insufficient documentation

## 2023-09-24 DIAGNOSIS — I10 Essential (primary) hypertension: Secondary | ICD-10-CM | POA: Diagnosis not present

## 2023-09-24 DIAGNOSIS — Z8679 Personal history of other diseases of the circulatory system: Secondary | ICD-10-CM | POA: Diagnosis not present

## 2023-09-24 DIAGNOSIS — I700 Atherosclerosis of aorta: Secondary | ICD-10-CM | POA: Diagnosis not present

## 2023-09-24 DIAGNOSIS — R002 Palpitations: Secondary | ICD-10-CM | POA: Diagnosis not present

## 2023-09-24 DIAGNOSIS — E782 Mixed hyperlipidemia: Secondary | ICD-10-CM | POA: Diagnosis not present

## 2023-09-24 DIAGNOSIS — R079 Chest pain, unspecified: Secondary | ICD-10-CM | POA: Diagnosis not present

## 2023-09-24 DIAGNOSIS — I38 Endocarditis, valve unspecified: Secondary | ICD-10-CM | POA: Diagnosis not present

## 2023-09-24 LAB — BRAIN NATRIURETIC PEPTIDE: B Natriuretic Peptide: 73.7 pg/mL (ref 0.0–100.0)

## 2023-09-28 ENCOUNTER — Ambulatory Visit (INDEPENDENT_AMBULATORY_CARE_PROVIDER_SITE_OTHER): Payer: Medicare Other

## 2023-09-28 DIAGNOSIS — Z23 Encounter for immunization: Secondary | ICD-10-CM | POA: Diagnosis not present

## 2023-10-01 ENCOUNTER — Encounter: Payer: Self-pay | Admitting: *Deleted

## 2023-10-01 ENCOUNTER — Encounter: Payer: Medicare Other | Attending: Pulmonary Disease | Admitting: *Deleted

## 2023-10-01 DIAGNOSIS — J449 Chronic obstructive pulmonary disease, unspecified: Secondary | ICD-10-CM | POA: Insufficient documentation

## 2023-10-01 DIAGNOSIS — M19011 Primary osteoarthritis, right shoulder: Secondary | ICD-10-CM | POA: Diagnosis not present

## 2023-10-01 DIAGNOSIS — Z96612 Presence of left artificial shoulder joint: Secondary | ICD-10-CM | POA: Diagnosis not present

## 2023-10-01 NOTE — Progress Notes (Signed)
Initial phone call completed. Dx can be found in [facility] 9/9. EP orientation scheduled for 10/16 at 2:30 PM.

## 2023-10-03 VITALS — Ht 63.4 in | Wt 160.6 lb

## 2023-10-03 DIAGNOSIS — J449 Chronic obstructive pulmonary disease, unspecified: Secondary | ICD-10-CM

## 2023-10-03 NOTE — Progress Notes (Signed)
Pulmonary Individual Treatment Plan  Patient Details  Name: Nancy Mack MRN: [id number] Date of Birth: 09-03-1946 Referring Provider:   Flowsheet Row Pulmonary Rehab from 10/03/2023 in [facility] Cardiac and Pulmonary Rehab  Referring Provider Dr. Vida Rigger       Initial Encounter Date:  Flowsheet Row Pulmonary Rehab from 10/03/2023 in [facility] Cardiac and Pulmonary Rehab  Date 10/03/23       Visit Diagnosis: Chronic obstructive pulmonary disease, unspecified COPD type (HCC)  Patient's Home Medications on Admission:  Current Outpatient Medications:    acetaminophen (TYLENOL) 500 MG tablet, Take 1,000 mg by mouth every 6 (six) hours as needed., Disp: , Rfl:    albuterol (VENTOLIN HFA) 108 (90 Base) MCG/ACT inhaler, Inhale 2 puffs into the lungs every 6 (six) hours as needed., Disp: 25.5 g, Rfl: 12   Ascorbic Acid (VITAMIN C) 100 MG tablet, Take 100 mg by mouth daily., Disp: , Rfl:    aspirin EC 81 MG tablet, Take 81 mg by mouth daily. Swallow whole., Disp: , Rfl:    atorvastatin (LIPITOR) 80 MG tablet, Take 1 tablet (80 mg total) by mouth daily., Disp: , Rfl:    azelastine (ASTELIN) 0.1 % nasal spray, Place 1 spray 2 (two) times daily into both nostrils. Use in each nostril as directed, Disp: 90 mL, Rfl: 0   Biotin 1000 MCG tablet, Take 1,000 mcg by mouth daily., Disp: , Rfl:    Cholecalciferol (VITAMIN D) 50 MCG (2000 UT) tablet, Take 2,000 Units by mouth daily., Disp: , Rfl:    fluticasone (FLONASE) 50 MCG/ACT nasal spray, Place 2 sprays into both nostrils daily. (Patient taking differently: Place 1 spray into both nostrils in the morning and at bedtime.), Disp: 48 g, Rfl: 12   Fluticasone-Umeclidin-Vilant (TRELEGY ELLIPTA) 100-62.5-25 MCG/ACT AEPB, Inhale 1 puff into the lungs daily., Disp: 3 each, Rfl: 4   latanoprost (XALATAN) 0.005 % ophthalmic solution, SMARTSIG:In Eye(s), Disp: , Rfl:    triamcinolone cream (KENALOG) 0.1 %, Apply 1 application topically 2 (two) times  daily. (Patient taking differently: Apply 1 application  topically 2 (two) times daily as needed (irritation).), Disp: 30 g, Rfl: 0  Past Medical History: Past Medical History:  Diagnosis Date   Allergy    Anemia    Arthritis    Asthma    Carpal tunnel syndrome    Chronic kidney disease    Dyspnea    Eczema    Globus pharyngeus    Hyperlipidemia    Hypertension    Laryngopharyngeal reflux    Menopause    Neuroma    right foot   Osteoporosis    Pneumonia    Raynaud's disease    Rhinitis due to pollen    SOB (shortness of breath)     Tobacco Use: Social History   Tobacco Use  Smoking Status Former   Current packs/day: 0.00   Average packs/day: 2.0 packs/day for 2.0 years (4.0 ttl pk-yrs)   Types: Cigarettes   Start date: 12/18/1976   Quit date: 12/18/1978   Years since quitting: 44.8  Smokeless Tobacco Never  Tobacco Comments   quit in 1980    Labs: Review Flowsheet  More data exists      Latest Ref Rng & Units 11/04/2020 05/04/2021 12/13/2021 06/13/2022 01/02/2023  Labs for ITP Cardiac and Pulmonary Rehab  Cholestrol 100 - 199 mg/dL 782  956  213  086  578   LDL (calc) 0 - 99 mg/dL 469  90  629  528  150   HDL-C >39 mg/dL 66  73  73  63  74   Trlycerides 0 - 149 mg/dL 425  956  95  387  564     Details             Pulmonary Assessment Scores:  Pulmonary Assessment Scores     Row Name 10/03/23 1555         ADL UCSD   ADL Phase Entry     SOB Score total 34     Rest 1     Walk 3     Stairs 5     Bath 0     Dress 1     Shop 4       CAT Score   CAT Score 24       mMRC Score   mMRC Score 3              UCSD: Self-administered rating of dyspnea associated with activities of daily living (ADLs) 6-point scale (0 = "not at all" to 5 = "maximal or unable to do because of breathlessness")  Scoring Scores range from 0 to 120.  Minimally important difference is 5 units  CAT: CAT can identify the health impairment of COPD patients and is better  correlated with disease progression.  CAT has a scoring range of zero to 40. The CAT score is classified into four groups of low (less than 10), medium (10 - 20), high (21-30) and very high (31-40) based on the impact level of disease on health status. A CAT score over 10 suggests significant symptoms.  A worsening CAT score could be explained by an exacerbation, poor medication adherence, poor inhaler technique, or progression of COPD or comorbid conditions.  CAT MCID is 2 points  mMRC: mMRC (Modified Medical Research Council) Dyspnea Scale is used to assess the degree of baseline functional disability in patients of respiratory disease due to dyspnea. No minimal important difference is established. A decrease in score of 1 point or greater is considered a positive change.   Pulmonary Function Assessment:   Exercise Target Goals: Exercise Program Goal: Individual exercise prescription set using results from initial 6 min walk test and THRR while considering  patient's activity barriers and safety.   Exercise Prescription Goal: Initial exercise prescription builds to 30-45 minutes a day of aerobic activity, 2-3 days per week.  Home exercise guidelines will be given to patient during program as part of exercise prescription that the participant will acknowledge.  Education: Aerobic Exercise: - Group verbal and visual presentation on the components of exercise prescription. Introduces F.I.T.T principle from ACSM for exercise prescriptions.  Reviews F.I.T.T. principles of aerobic exercise including progression. Written material given at graduation.   Education: Resistance Exercise: - Group verbal and visual presentation on the components of exercise prescription. Introduces F.I.T.T principle from ACSM for exercise prescriptions  Reviews F.I.T.T. principles of resistance exercise including progression. Written material given at graduation.    Education: Exercise & Equipment Safety: -  Individual verbal instruction and demonstration of equipment use and safety with use of the equipment. Flowsheet Row Pulmonary Rehab from 10/03/2023 in [facility] Cardiac and Pulmonary Rehab  Date 10/03/23  Educator [facility]  Instruction Review Code 1- Verbalizes Understanding       Education: Exercise Physiology & General Exercise Guidelines: - Group verbal and written instruction with models to review the exercise physiology of the cardiovascular system and associated critical values. Provides general exercise guidelines with specific guidelines to those  with heart or lung disease.    Education: Flexibility, Balance, Mind/Body Relaxation: - Group verbal and visual presentation with interactive activity on the components of exercise prescription. Introduces F.I.T.T principle from ACSM for exercise prescriptions. Reviews F.I.T.T. principles of flexibility and balance exercise training including progression. Also discusses the mind body connection.  Reviews various relaxation techniques to help reduce and manage stress (i.e. Deep breathing, progressive muscle relaxation, and visualization). Balance handout provided to take home. Written material given at graduation.   Activity Barriers & Risk Stratification:  Activity Barriers & Cardiac Risk Stratification - 10/03/23 1603       Activity Barriers & Cardiac Risk Stratification   Activity Barriers Arthritis             6 Minute Walk:  6 Minute Walk     Row Name 10/03/23 1600         6 Minute Walk   Phase Initial     Distance 1060 feet     Walk Time 6 minutes     # of Rest Breaks 0     MPH 2     METS 2.1     RPE 13     Perceived Dyspnea  1     VO2 Peak 7.4     Symptoms Yes (comment)     Comments Coughing     Resting HR 82 bpm     Resting BP 122/74     Resting Oxygen Saturation  94 %     Exercise Oxygen Saturation  during 6 min walk 91 %     Max Ex. HR 106 bpm     Max Ex. BP 132/72     2 Minute Post BP 130/72       Interval HR    1 Minute HR 97     2 Minute HR 102     3 Minute HR 106     4 Minute HR 105     5 Minute HR 106     6 Minute HR 107     Interval Heart Rate? Yes       Interval Oxygen   Interval Oxygen? Yes     Baseline Oxygen Saturation % 94 %     1 Minute Oxygen Saturation % 92 %     1 Minute Liters of Oxygen 0 L     2 Minute Oxygen Saturation % 91 %     2 Minute Liters of Oxygen 0 L     3 Minute Oxygen Saturation % 93 %     3 Minute Liters of Oxygen 0 L     4 Minute Oxygen Saturation % 93 %     4 Minute Liters of Oxygen 0 L     5 Minute Oxygen Saturation % 92 %     5 Minute Liters of Oxygen 0 L     6 Minute Oxygen Saturation % 91 %     6 Minute Liters of Oxygen 0 L     2 Minute Post Oxygen Saturation % 93 %     2 Minute Post Liters of Oxygen 0 L             Oxygen Initial Assessment:  Oxygen Initial Assessment - 10/03/23 1613       Home Oxygen   Home Oxygen Device None    Sleep Oxygen Prescription None    Home Exercise Oxygen Prescription None    Home Resting Oxygen Prescription None  Initial 6 min Walk   Oxygen Used None      Program Oxygen Prescription   Program Oxygen Prescription None             Oxygen Re-Evaluation:   Oxygen Discharge (Final Oxygen Re-Evaluation):   Initial Exercise Prescription:  Initial Exercise Prescription - 10/03/23 1600       Date of Initial Exercise RX and Referring Provider   Date 10/03/23    Referring Provider Dr. Vida Rigger      Oxygen   Maintain Oxygen Saturation 88% or higher      Recumbant Bike   Level 2    RPM 50    Watts 25    Minutes 15    METs 2.1      REL-XR   Level 1    Speed 50    Minutes 15    METs 2.1      T5 Nustep   Level 2    SPM 80    Minutes 15    METs 2.1      Track   Laps 20    Minutes 15    METs 2.1      Prescription Details   Duration Progress to 30 minutes of continuous aerobic without signs/symptoms of physical distress      Intensity   THRR 40-80% of Max Heartrate  106-131    Ratings of Perceived Exertion 11-13    Perceived Dyspnea 0-4      Progression   Progression Continue to progress workloads to maintain intensity without signs/symptoms of physical distress.      Resistance Training   Training Prescription Yes    Weight 2 lb    Reps 10-15             Perform Capillary Blood Glucose checks as needed.  Exercise Prescription Changes:   Exercise Prescription Changes     Row Name 10/03/23 1600             Response to Exercise   Blood Pressure (Admit) 122/74       Blood Pressure (Exercise) 132/72       Blood Pressure (Exit) 130/72       Heart Rate (Admit) 82 bpm       Heart Rate (Exercise) 106 bpm       Heart Rate (Exit) 84 bpm       Oxygen Saturation (Admit) 94 %       Oxygen Saturation (Exercise) 91 %       Oxygen Saturation (Exit) 93 %       Rating of Perceived Exertion (Exercise) 13       Perceived Dyspnea (Exercise) 1       Symptoms coughing       Comments results         Progression   Average METs 2.1                Exercise Comments:   Exercise Goals and Review:   Exercise Goals     Row Name 10/03/23 1607             Exercise Goals   Increase Physical Activity Yes       Intervention Develop an individualized exercise prescription for aerobic and resistive training based on initial evaluation findings, risk stratification, comorbidities and participant's personal goals.;Provide advice, education, support and counseling about physical activity/exercise needs.       Expected Outcomes Long Term: Exercising regularly at least 3-5 days a week.;Short  Term: Attend rehab on a regular basis to increase amount of physical activity.;Long Term: Add in home exercise to make exercise part of routine and to increase amount of physical activity.       Increase Strength and Stamina Yes       Intervention Develop an individualized exercise prescription for aerobic and resistive training based on initial evaluation  findings, risk stratification, comorbidities and participant's personal goals.;Provide advice, education, support and counseling about physical activity/exercise needs.       Expected Outcomes Long Term: Improve cardiorespiratory fitness, muscular endurance and strength as measured by increased METs and functional capacity ( );Short Term: Perform resistance training exercises routinely during rehab and add in resistance training at home;Short Term: Increase workloads from initial exercise prescription for resistance, speed, and METs.       Able to understand and use rate of perceived exertion (RPE) scale Yes       Intervention Provide education and explanation on how to use RPE scale       Expected Outcomes Long Term:  Able to use RPE to guide intensity level when exercising independently;Short Term: Able to use RPE daily in rehab to express subjective intensity level       Able to understand and use Dyspnea scale Yes       Intervention Provide education and explanation on how to use Dyspnea scale       Expected Outcomes Long Term: Able to use Dyspnea scale to guide intensity level when exercising independently;Short Term: Able to use Dyspnea scale daily in rehab to express subjective sense of shortness of breath during exertion       Knowledge and understanding of Target Heart Rate Range (THRR) Yes       Intervention Provide education and explanation of THRR including how the numbers were predicted and where they are located for reference       Expected Outcomes Long Term: Able to use THRR to govern intensity when exercising independently;Short Term: Able to use daily as guideline for intensity in rehab;Short Term: Able to state/look up THRR       Able to check pulse independently Yes       Intervention Review the importance of being able to check your own pulse for safety during independent exercise;Provide education and demonstration on how to check pulse in carotid and radial arteries.        Expected Outcomes Long Term: Able to check pulse independently and accurately;Short Term: Able to explain why pulse checking is important during independent exercise       Understanding of Exercise Prescription Yes       Intervention Provide education, explanation, and written materials on patient's individual exercise prescription       Expected Outcomes Long Term: Able to explain home exercise prescription to exercise independently;Short Term: Able to explain program exercise prescription                Exercise Goals Re-Evaluation :   Discharge Exercise Prescription (Final Exercise Prescription Changes):  Exercise Prescription Changes - 10/03/23 1600       Response to Exercise   Blood Pressure (Admit) 122/74    Blood Pressure (Exercise) 132/72    Blood Pressure (Exit) 130/72    Heart Rate (Admit) 82 bpm    Heart Rate (Exercise) 106 bpm    Heart Rate (Exit) 84 bpm    Oxygen Saturation (Admit) 94 %    Oxygen Saturation (Exercise) 91 %    Oxygen Saturation (Exit) 93 %  Rating of Perceived Exertion (Exercise) 13    Perceived Dyspnea (Exercise) 1    Symptoms coughing    Comments results      Progression   Average METs 2.1             Nutrition:  Target Goals: Understanding of nutrition guidelines, daily intake of sodium 1500mg , cholesterol 200mg , calories 30% from fat and 7% or less from saturated fats, daily to have 5 or more servings of fruits and vegetables.  Education: All About Nutrition: -Group instruction provided by verbal, written material, interactive activities, discussions, models, and posters to present general guidelines for heart healthy nutrition including fat, fiber, MyPlate, the role of sodium in heart healthy nutrition, utilization of the nutrition label, and utilization of this knowledge for meal planning. Follow up email sent as well. Written material given at graduation.   Biometrics:  Pre Biometrics - 10/03/23 1609       Pre  Biometrics   Height 5' 3.4" (1.61 m)    Weight 160 lb 9.6 oz (72.8 kg)    Waist Circumference 39.4 inches    Hip Circumference 41.5 inches    Waist to Hip Ratio 0.95 %    BMI (Calculated) 28.1    Single Leg Stand 2.88 seconds              Nutrition Therapy Plan and Nutrition Goals:   Nutrition Assessments:  MEDIFICTS Score Key: >=70 Need to make dietary changes  40-70 Heart Healthy Diet <= 40 Therapeutic Level Cholesterol Diet  Flowsheet Row Pulmonary Rehab from 10/03/2023 in [facility] Cardiac and Pulmonary Rehab  Picture Your Plate Total Score on Admission 60      Picture Your Plate Scores: <72 Unhealthy dietary pattern with much room for improvement. 41-50 Dietary pattern unlikely to meet recommendations for good health and room for improvement. 51-60 More healthful dietary pattern, with some room for improvement.  >60 Healthy dietary pattern, although there may be some specific behaviors that could be improved.   Nutrition Goals Re-Evaluation:   Nutrition Goals Discharge (Final Nutrition Goals Re-Evaluation):   Psychosocial: Target Goals: Acknowledge presence or absence of significant depression and/or stress, maximize coping skills, provide positive support system. Participant is able to verbalize types and ability to use techniques and skills needed for reducing stress and depression.   Education: Stress, Anxiety, and Depression - Group verbal and visual presentation to define topics covered.  Reviews how body is impacted by stress, anxiety, and depression.  Also discusses healthy ways to reduce stress and to treat/manage anxiety and depression.  Written material given at graduation.   Education: Sleep Hygiene -Provides group verbal and written instruction about how sleep can affect your health.  Define sleep hygiene, discuss sleep cycles and impact of sleep habits. Review good sleep hygiene tips.    Initial Review & Psychosocial Screening:  Initial Psych Review  & Screening - 10/01/23 1415       Initial Review   Current issues with None Identified      Family Dynamics   Good Support System? Yes   husband, grandson and grand-daughter     Screening Interventions   Interventions Encouraged to exercise    Expected Outcomes Short Term goal: Utilizing psychosocial counselor, staff and physician to assist with identification of specific Stressors or current issues interfering with healing process. Setting desired goal for each stressor or current issue identified.;Long Term Goal: Stressors or current issues are controlled or eliminated.;Short Term goal: Identification and review with participant of any  Quality of Life or Depression concerns found by scoring the questionnaire.;Long Term goal: The participant improves quality of Life and PHQ9 Scores as seen by post scores and/or verbalization of changes             Quality of Life Scores:  Scores of 19 and below usually indicate a poorer quality of life in these areas.  A difference of  2-3 points is a clinically meaningful difference.  A difference of 2-3 points in the total score of the Quality of Life Index has been associated with significant improvement in overall quality of life, self-image, physical symptoms, and general health in studies assessing change in quality of life.  PHQ-9: Review Flowsheet  More data exists      10/03/2023 03/30/2023 03/20/2023 01/02/2023 06/13/2022  Depression screen PHQ 2/9  Decreased Interest 0 0 0 0 0  Down, Depressed, Hopeless 0 0 0 0 0  PHQ - 2 Score 0 0 0 0 0  Altered sleeping 0 0 0 0 0  Tired, decreased energy 2 0 0 0 0  Change in appetite 0 0 0 0 0  Feeling bad or failure about yourself  0 0 0 0 0  Trouble concentrating 0 0 0 0 0  Moving slowly or fidgety/restless 0 0 0 0 0  Suicidal thoughts 0 0 0 0 0  PHQ-9 Score 2 0 0 0 0  Difficult doing work/chores Somewhat difficult Not difficult at all Not difficult at all Not difficult at all Not difficult at all     Details           Interpretation of Total Score  Total Score Depression Severity:  1-4 = Minimal depression, 5-9 = Mild depression, 10-14 = Moderate depression, 15-19 = Moderately severe depression, 20-27 = Severe depression   Psychosocial Evaluation and Intervention:  Psychosocial Evaluation - 10/01/23 1430       Psychosocial Evaluation & Interventions   Interventions Encouraged to exercise with the program and follow exercise prescription    Comments Morghan is coming to pulmonary rehab for COPD. She has no barriers to attending the program. She reports no concerns with stress, depression, or anxiety. She has a good support system at home that includes her husband and also her grand-daughter and grandson that live with her. Matisha would like to be able to do more activities without getting short of breath and hopes to reach this goal with rehab.    Expected Outcomes Short:  Attend pulmonary rehab for education and exercise  Long:  Develop and maintain positive self care habits    Continue Psychosocial Services  Follow up required by staff             Psychosocial Re-Evaluation:   Psychosocial Discharge (Final Psychosocial Re-Evaluation):   Education: Education Goals: Education classes will be provided on a weekly basis, covering required topics. Participant will state understanding/return demonstration of topics presented.  Learning Barriers/Preferences:   General Pulmonary Education Topics:  Infection Prevention: - Provides verbal and written material to individual with discussion of infection control including proper hand washing and proper equipment cleaning during exercise session. Flowsheet Row Pulmonary Rehab from 10/03/2023 in [facility] Cardiac and Pulmonary Rehab  Date 10/03/23  Educator [facility]  Instruction Review Code 1- Verbalizes Understanding       Falls Prevention: - Provides verbal and written material to individual with discussion of falls prevention  and safety. Flowsheet Row Pulmonary Rehab from 10/03/2023 in [facility] Cardiac and Pulmonary Rehab  Date 10/03/23  Educator  [facility]  Instruction Review Code 1- Verbalizes Understanding       Chronic Lung Disease Review: - Group verbal instruction with posters, models, PowerPoint presentations and videos,  to review new updates, new respiratory medications, new advancements in procedures and treatments. Providing information on websites and "800" numbers for continued self-education. Includes information about supplement oxygen, available portable oxygen systems, continuous and intermittent flow rates, oxygen safety, concentrators, and Medicare reimbursement for oxygen. Explanation of Pulmonary Drugs, including class, frequency, complications, importance of spacers, rinsing mouth after steroid MDI's, and proper cleaning methods for nebulizers. Review of basic lung anatomy and physiology related to function, structure, and complications of lung disease. Review of risk factors. Discussion about methods for diagnosing sleep apnea and types of masks and machines for OSA. Includes a review of the use of types of environmental controls: home humidity, furnaces, filters, dust mite/pet prevention, HEPA vacuums. Discussion about weather changes, air quality and the benefits of nasal washing. Instruction on Warning signs, infection symptoms, calling MD promptly, preventive modes, and value of vaccinations. Review of effective airway clearance, coughing and/or vibration techniques. Emphasizing that all should Create an Action Plan. Written material given at graduation.   AED/CPR: - Group verbal and written instruction with the use of models to demonstrate the basic use of the AED with the basic ABC's of resuscitation.    Anatomy and Cardiac Procedures: - Group verbal and visual presentation and models provide information about basic cardiac anatomy and function. Reviews the testing methods done to diagnose heart  disease and the outcomes of the test results. Describes the treatment choices: Medical Management, Angioplasty, or Coronary Bypass Surgery for treating various heart conditions including Myocardial Infarction, Angina, Valve Disease, and Cardiac Arrhythmias.  Written material given at graduation.   Medication Safety: - Group verbal and visual instruction to review commonly prescribed medications for heart and lung disease. Reviews the medication, class of the drug, and side effects. Includes the steps to properly store meds and maintain the prescription regimen.  Written material given at graduation.   Other: -Provides group and verbal instruction on various topics (see comments)   Knowledge Questionnaire Score:    Core Components/Risk Factors/Patient Goals at Admission:  Personal Goals and Risk Factors at Admission - 10/01/23 1419       Core Components/Risk Factors/Patient Goals on Admission    Weight Management Yes    Intervention Weight Management: Develop a combined nutrition and exercise program designed to reach desired caloric intake, while maintaining appropriate intake of nutrient and fiber, sodium and fats, and appropriate energy expenditure required for the weight goal.;Weight Management: Provide education and appropriate resources to help participant work on and attain dietary goals.;Weight Management/Obesity: Establish reasonable short term and long term weight goals.    Admit Weight 157 lb (71.2 kg)    Goal Weight: Short Term 152 lb (68.9 kg)    Goal Weight: Long Term 147 lb (66.7 kg)    Expected Outcomes Short Term: Continue to assess and modify interventions until short term weight is achieved;Long Term: Adherence to nutrition and physical activity/exercise program aimed toward attainment of established weight goal;Weight Maintenance: Understanding of the daily nutrition guidelines, which includes 25-35% calories from fat, 7% or less cal from saturated fats, less than 200mg   cholesterol, less than 1.5gm of sodium, & 5 or more servings of fruits and vegetables daily;Weight Loss: Understanding of general recommendations for a balanced deficit meal plan, which promotes 1-2 lb weight loss per week and includes a negative energy balance of [phone]  kcal/d;Understanding recommendations for meals to include 15-35% energy as protein, 25-35% energy from fat, 35-60% energy from carbohydrates, less than 200mg  of dietary cholesterol, 20-35 gm of total fiber daily;Understanding of distribution of calorie intake throughout the day with the consumption of 4-5 meals/snacks    Improve shortness of breath with ADL's Yes    Intervention Provide education, individualized exercise plan and daily activity instruction to help decrease symptoms of SOB with activities of daily living.    Expected Outcomes Short Term: Improve cardiorespiratory fitness to achieve a reduction of symptoms when performing ADLs;Long Term: Be able to perform more ADLs without symptoms or delay the onset of symptoms    Increase knowledge of respiratory medications and ability to use respiratory devices properly  Yes    Intervention Provide education and demonstration as needed of appropriate use of medications, inhalers, and oxygen therapy.    Expected Outcomes Short Term: Achieves understanding of medications use. Understands that oxygen is a medication prescribed by physician. Demonstrates appropriate use of inhaler and oxygen therapy.;Long Term: Maintain appropriate use of medications, inhalers, and oxygen therapy.    Hypertension Yes    Intervention Monitor prescription use compliance.;Provide education on lifestyle modifcations including regular physical activity/exercise, weight management, moderate sodium restriction and increased consumption of fresh fruit, vegetables, and low fat dairy, alcohol moderation, and smoking cessation.    Expected Outcomes Short Term: Continued assessment and intervention until BP is <  140/90mm HG in hypertensive participants. < 130/47mm HG in hypertensive participants with diabetes, heart failure or chronic kidney disease.;Long Term: Maintenance of blood pressure at goal levels.             Education:Diabetes - Individual verbal and written instruction to review signs/symptoms of diabetes, desired ranges of glucose level fasting, after meals and with exercise. Acknowledge that pre and post exercise glucose checks will be done for 3 sessions at entry of program.   Know Your Numbers and Heart Failure: - Group verbal and visual instruction to discuss disease risk factors for cardiac and pulmonary disease and treatment options.  Reviews associated critical values for Overweight/Obesity, Hypertension, Cholesterol, and Diabetes.  Discusses basics of heart failure: signs/symptoms and treatments.  Introduces Heart Failure Zone chart for action plan for heart failure.  Written material given at graduation.   Core Components/Risk Factors/Patient Goals Review:    Core Components/Risk Factors/Patient Goals at Discharge (Final Review):    ITP Comments:  ITP Comments     Row Name 10/01/23 1427 10/03/23 1559         ITP Comments Initial phone call completed. Dx can be [facility] in [facility] 9/9. EP orientation scheduled for 10/16 at 2:30 PM. Completed and gym orientation. Initial ITP created and sent for review to Dr. Jinny Sanders, Medical Director.               Comments: Initial ITP

## 2023-10-03 NOTE — Patient Instructions (Signed)
Patient Instructions  Patient Details  Name: Nancy Mack MRN: [id number] Date of Birth: [birth year] Referring Provider:  Vida Rigger, MD  Below are your personal goals for exercise, nutrition, and risk factors. Our goal is to help you stay on track towards obtaining and maintaining these goals. We will be discussing your progress on these goals with you throughout the program.  Initial Exercise Prescription:  Initial Exercise Prescription - 10/03/23 1600       Date of Initial Exercise RX and Referring Provider   Date 10/03/23    Referring Provider Dr. Vida Rigger      Oxygen   Maintain Oxygen Saturation 88% or higher      Recumbant Bike   Level 2    RPM 50    Watts 25    Minutes 15    METs 2.1      REL-XR   Level 1    Speed 50    Minutes 15    METs 2.1      T5 Nustep   Level 2    SPM 80    Minutes 15    METs 2.1      Track   Laps 20    Minutes 15    METs 2.1      Prescription Details   Duration Progress to 30 minutes of continuous aerobic without signs/symptoms of physical distress      Intensity   THRR 40-80% of Max Heartrate 106-131    Ratings of Perceived Exertion 11-13    Perceived Dyspnea 0-4      Progression   Progression Continue to progress workloads to maintain intensity without signs/symptoms of physical distress.      Resistance Training   Training Prescription Yes    Weight 2 lb    Reps 10-15             Exercise Goals: Frequency: Be able to perform aerobic exercise two to three times per week in program working toward 2-5 days per week of home exercise.  Intensity: Work with a perceived exertion of 11 (fairly light) - 15 (hard) while following your exercise prescription.  We will make changes to your prescription with you as you progress through the program.   Duration: Be able to do 30 to 45 minutes of continuous aerobic exercise in addition to a 5 minute warm-up and a 5 minute cool-down routine.   Nutrition  Goals: Your personal nutrition goals will be established when you do your nutrition analysis with the dietician.  The following are general nutrition guidelines to follow: Cholesterol < 200mg /day Sodium < 1500mg /day Fiber: Women over 50 yrs - 21 grams per day  Personal Goals:  Personal Goals and Risk Factors at Admission - 10/01/23 1419       Core Components/Risk Factors/Patient Goals on Admission    Weight Management Yes    Intervention Weight Management: Develop a combined nutrition and exercise program designed to reach desired caloric intake, while maintaining appropriate intake of nutrient and fiber, sodium and fats, and appropriate energy expenditure required for the weight goal.;Weight Management: Provide education and appropriate resources to help participant work on and attain dietary goals.;Weight Management/Obesity: Establish reasonable short term and long term weight goals.    Admit Weight 157 lb (71.2 kg)    Goal Weight: Short Term 152 lb (68.9 kg)    Goal Weight: Long Term 147 lb (66.7 kg)    Expected Outcomes Short Term: Continue to assess and modify interventions until short term  weight is achieved;Long Term: Adherence to nutrition and physical activity/exercise program aimed toward attainment of established weight goal;Weight Maintenance: Understanding of the daily nutrition guidelines, which includes 25-35% calories from fat, 7% or less cal from saturated fats, less than 200mg  cholesterol, less than 1.5gm of sodium, & 5 or more servings of fruits and vegetables daily;Weight Loss: Understanding of general recommendations for a balanced deficit meal plan, which promotes 1-2 lb weight loss per week and includes a negative energy balance of [phone] kcal/d;Understanding recommendations for meals to include 15-35% energy as protein, 25-35% energy from fat, 35-60% energy from carbohydrates, less than 200mg  of dietary cholesterol, 20-35 gm of total fiber daily;Understanding of  distribution of calorie intake throughout the day with the consumption of 4-5 meals/snacks    Improve shortness of breath with ADL's Yes    Intervention Provide education, individualized exercise plan and daily activity instruction to help decrease symptoms of SOB with activities of daily living.    Expected Outcomes Short Term: Improve cardiorespiratory fitness to achieve a reduction of symptoms when performing ADLs;Long Term: Be able to perform more ADLs without symptoms or delay the onset of symptoms    Increase knowledge of respiratory medications and ability to use respiratory devices properly  Yes    Intervention Provide education and demonstration as needed of appropriate use of medications, inhalers, and oxygen therapy.    Expected Outcomes Short Term: Achieves understanding of medications use. Understands that oxygen is a medication prescribed by physician. Demonstrates appropriate use of inhaler and oxygen therapy.;Long Term: Maintain appropriate use of medications, inhalers, and oxygen therapy.    Hypertension Yes    Intervention Monitor prescription use compliance.;Provide education on lifestyle modifcations including regular physical activity/exercise, weight management, moderate sodium restriction and increased consumption of fresh fruit, vegetables, and low fat dairy, alcohol moderation, and smoking cessation.    Expected Outcomes Short Term: Continued assessment and intervention until BP is < 140/16mm HG in hypertensive participants. < 130/83mm HG in hypertensive participants with diabetes, heart failure or chronic kidney disease.;Long Term: Maintenance of blood pressure at goal levels.             Exercise Goals and Review:  Exercise Goals     Row Name 10/03/23 1607             Exercise Goals   Increase Physical Activity Yes       Intervention Develop an individualized exercise prescription for aerobic and resistive training based on initial evaluation findings, risk  stratification, comorbidities and participant's personal goals.;Provide advice, education, support and counseling about physical activity/exercise needs.       Expected Outcomes Long Term: Exercising regularly at least 3-5 days a week.;Short Term: Attend rehab on a regular basis to increase amount of physical activity.;Long Term: Add in home exercise to make exercise part of routine and to increase amount of physical activity.       Increase Strength and Stamina Yes       Intervention Develop an individualized exercise prescription for aerobic and resistive training based on initial evaluation findings, risk stratification, comorbidities and participant's personal goals.;Provide advice, education, support and counseling about physical activity/exercise needs.       Expected Outcomes Long Term: Improve cardiorespiratory fitness, muscular endurance and strength as measured by increased METs and functional capacity ( );Short Term: Perform resistance training exercises routinely during rehab and add in resistance training at home;Short Term: Increase workloads from initial exercise prescription for resistance, speed, and METs.       Able  to understand and use rate of perceived exertion (RPE) scale Yes       Intervention Provide education and explanation on how to use RPE scale       Expected Outcomes Long Term:  Able to use RPE to guide intensity level when exercising independently;Short Term: Able to use RPE daily in rehab to express subjective intensity level       Able to understand and use Dyspnea scale Yes       Intervention Provide education and explanation on how to use Dyspnea scale       Expected Outcomes Long Term: Able to use Dyspnea scale to guide intensity level when exercising independently;Short Term: Able to use Dyspnea scale daily in rehab to express subjective sense of shortness of breath during exertion       Knowledge and understanding of Target Heart Rate Range (THRR) Yes        Intervention Provide education and explanation of THRR including how the numbers were predicted and where they are located for reference       Expected Outcomes Long Term: Able to use THRR to govern intensity when exercising independently;Short Term: Able to use daily as guideline for intensity in rehab;Short Term: Able to state/look up THRR       Able to check pulse independently Yes       Intervention Review the importance of being able to check your own pulse for safety during independent exercise;Provide education and demonstration on how to check pulse in carotid and radial arteries.       Expected Outcomes Long Term: Able to check pulse independently and accurately;Short Term: Able to explain why pulse checking is important during independent exercise       Understanding of Exercise Prescription Yes       Intervention Provide education, explanation, and written materials on patient's individual exercise prescription       Expected Outcomes Long Term: Able to explain home exercise prescription to exercise independently;Short Term: Able to explain program exercise prescription                Copy of goals given to participant.

## 2023-10-08 DIAGNOSIS — R002 Palpitations: Secondary | ICD-10-CM | POA: Diagnosis not present

## 2023-10-09 DIAGNOSIS — R0602 Shortness of breath: Secondary | ICD-10-CM | POA: Diagnosis not present

## 2023-10-09 DIAGNOSIS — R002 Palpitations: Secondary | ICD-10-CM | POA: Diagnosis not present

## 2023-10-09 DIAGNOSIS — I38 Endocarditis, valve unspecified: Secondary | ICD-10-CM | POA: Diagnosis not present

## 2023-10-09 DIAGNOSIS — R0789 Other chest pain: Secondary | ICD-10-CM | POA: Diagnosis not present

## 2023-10-11 ENCOUNTER — Encounter: Payer: Medicare Other | Admitting: *Deleted

## 2023-10-11 DIAGNOSIS — J449 Chronic obstructive pulmonary disease, unspecified: Secondary | ICD-10-CM | POA: Diagnosis not present

## 2023-10-11 NOTE — Progress Notes (Signed)
Daily Session Note  Patient Details  Name: Nancy Mack MRN: [id number] Date of Birth: [birth year] Referring Provider:   Flowsheet Row Pulmonary Rehab from 10/03/2023 in [facility] Cardiac and Pulmonary Rehab  Referring Provider Dr. Vida Rigger       Encounter Date: 10/11/2023  Check In:  Session Check In - 10/11/23 0933       Check-In   Staff Present Cora Collum, RN, BSN, CCRP;Joseph Hood, RCP,RRT,BSRT;Maxon Vibbard BS, , Exercise Physiologist;Leeroy Lovings Katrinka Blazing, RN, ADN    Virtual Visit No    Medication changes reported     No    Fall or balance concerns reported    No    Warm-up and Cool-down Performed on first and last piece of equipment    Resistance Training Performed Yes    VAD Patient? No    PAD/SET Patient? No      Pain Assessment   Currently in Pain? No/denies                Social History   Tobacco Use  Smoking Status Former   Current packs/day: 0.00   Average packs/day: 2.0 packs/day for 2.0 years (4.0 ttl pk-yrs)   Types: Cigarettes   Start date: 12/18/1976   Quit date: 12/18/1978   Years since quitting: 44.8  Smokeless Tobacco Never  Tobacco Comments   quit in 1980    Goals Met:  Independence with exercise equipment Exercise tolerated well No report of concerns or symptoms today Strength training completed today  Goals Unmet:  Not Applicable  Comments: First full day of exercise!  Patient was oriented to gym and equipment including functions, settings, policies, and procedures.  Patient's individual exercise prescription and treatment plan were reviewed.  All starting workloads were established based on the results of the 6 minute walk test done at initial orientation visit.  The plan for exercise progression was also introduced and progression will be customized based on patient's performance and goals.     Dr. Bethann Punches is Medical Director for [facility] Cardiac Rehabilitation.  Dr. Vida Rigger is Medical Director for [facility]  Pulmonary Rehabilitation.

## 2023-10-16 ENCOUNTER — Encounter: Payer: Medicare Other | Admitting: *Deleted

## 2023-10-16 DIAGNOSIS — J449 Chronic obstructive pulmonary disease, unspecified: Secondary | ICD-10-CM

## 2023-10-16 NOTE — Progress Notes (Signed)
Daily Session Note  Patient Details  Name: Nancy Mack MRN: [id number] Date of Birth: 04/24/1946 Referring Provider:   Flowsheet Row Pulmonary Rehab from 10/03/2023 in [facility] Cardiac and Pulmonary Rehab  Referring Provider Dr. Vida Rigger       Encounter Date: 10/16/2023  Check In:  Session Check In - 10/16/23 0950       Check-In   Supervising physician immediately available to respond to emergencies See telemetry face sheet for immediately available ER MD    Location [facility]-Cardiac & Pulmonary Rehab    Staff Present Cora Collum, RN, BSN, CCRP;Noah Tickle, BS, Exercise Physiologist;Margaret Best, MS, Exercise Physiologist;Maxon Conetta BS, , Exercise Physiologist    Virtual Visit No    Medication changes reported     No    Fall or balance concerns reported    No    Warm-up and Cool-down Performed on first and last piece of equipment    Resistance Training Performed Yes    VAD Patient? No    PAD/SET Patient? No      Pain Assessment   Currently in Pain? No/denies                Social History   Tobacco Use  Smoking Status Former   Current packs/day: 0.00   Average packs/day: 2.0 packs/day for 2.0 years (4.0 ttl pk-yrs)   Types: Cigarettes   Start date: 12/18/1976   Quit date: 12/18/1978   Years since quitting: 44.8  Smokeless Tobacco Never  Tobacco Comments   quit in 1980    Goals Met:  Proper associated with RPD/PD & O2 Sat Independence with exercise equipment Exercise tolerated well No report of concerns or symptoms today  Goals Unmet:  Not Applicable  Comments: Pt able to follow exercise prescription today without complaint.  Will continue to monitor for progression.    Dr. Bethann Punches is Medical Director for [facility] Cardiac Rehabilitation.  Dr. Vida Rigger is Medical Director for [facility] Pulmonary Rehabilitation.

## 2023-10-17 ENCOUNTER — Encounter: Payer: Self-pay | Admitting: *Deleted

## 2023-10-17 DIAGNOSIS — J449 Chronic obstructive pulmonary disease, unspecified: Secondary | ICD-10-CM

## 2023-10-17 NOTE — Progress Notes (Signed)
Pulmonary Individual Treatment Plan  Patient Details  Name: Nancy Mack MRN: [id number] Date of Birth: 04-15-1946 Referring Provider:   Flowsheet Row Pulmonary Rehab from 10/03/2023 in [facility] Cardiac and Pulmonary Rehab  Referring Provider Dr. Vida Rigger       Initial Encounter Date:  Flowsheet Row Pulmonary Rehab from 10/03/2023 in [facility] Cardiac and Pulmonary Rehab  Date 10/03/23       Visit Diagnosis: Chronic obstructive pulmonary disease, unspecified COPD type (HCC)  Patient's Home Medications on Admission:  Current Outpatient Medications:    acetaminophen (TYLENOL) 500 MG tablet, Take 1,000 mg by mouth every 6 (six) hours as needed., Disp: , Rfl:    albuterol (VENTOLIN HFA) 108 (90 Base) MCG/ACT inhaler, Inhale 2 puffs into the lungs every 6 (six) hours as needed., Disp: 25.5 g, Rfl: 12   Ascorbic Acid (VITAMIN C) 100 MG tablet, Take 100 mg by mouth daily., Disp: , Rfl:    aspirin EC 81 MG tablet, Take 81 mg by mouth daily. Swallow whole., Disp: , Rfl:    atorvastatin (LIPITOR) 80 MG tablet, Take 1 tablet (80 mg total) by mouth daily., Disp: , Rfl:    azelastine (ASTELIN) 0.1 % nasal spray, Place 1 spray 2 (two) times daily into both nostrils. Use in each nostril as directed, Disp: 90 mL, Rfl: 0   Biotin 1000 MCG tablet, Take 1,000 mcg by mouth daily., Disp: , Rfl:    Cholecalciferol (VITAMIN D) 50 MCG (2000 UT) tablet, Take 2,000 Units by mouth daily., Disp: , Rfl:    fluticasone (FLONASE) 50 MCG/ACT nasal spray, Place 2 sprays into both nostrils daily. (Patient taking differently: Place 1 spray into both nostrils in the morning and at bedtime.), Disp: 48 g, Rfl: 12   Fluticasone-Umeclidin-Vilant (TRELEGY ELLIPTA) 100-62.5-25 MCG/ACT AEPB, Inhale 1 puff into the lungs daily., Disp: 3 each, Rfl: 4   latanoprost (XALATAN) 0.005 % ophthalmic solution, SMARTSIG:In Eye(s), Disp: , Rfl:    triamcinolone cream (KENALOG) 0.1 %, Apply 1 application topically 2 (two) times  daily. (Patient taking differently: Apply 1 application  topically 2 (two) times daily as needed (irritation).), Disp: 30 g, Rfl: 0  Past Medical History: Past Medical History:  Diagnosis Date   Allergy    Anemia    Arthritis    Asthma    Carpal tunnel syndrome    Chronic kidney disease    Dyspnea    Eczema    Globus pharyngeus    Hyperlipidemia    Hypertension    Laryngopharyngeal reflux    Menopause    Neuroma    right foot   Osteoporosis    Pneumonia    Raynaud's disease    Rhinitis due to pollen    SOB (shortness of breath)     Tobacco Use: Social History   Tobacco Use  Smoking Status Former   Current packs/day: 0.00   Average packs/day: 2.0 packs/day for 2.0 years (4.0 ttl pk-yrs)   Types: Cigarettes   Start date: 12/18/1976   Quit date: 12/18/1978   Years since quitting: 44.8  Smokeless Tobacco Never  Tobacco Comments   quit in 1980    Labs: Review Flowsheet  More data exists      Latest Ref Rng & Units 11/04/2020 05/04/2021 12/13/2021 06/13/2022 01/02/2023  Labs for ITP Cardiac and Pulmonary Rehab  Cholestrol 100 - 199 mg/dL 409  811  914  782  956   LDL (calc) 0 - 99 mg/dL 213  90  086  578  150   HDL-C >39 mg/dL 66  73  73  63  74   Trlycerides 0 - 149 mg/dL 914  782  95  956  213     Details             Pulmonary Assessment Scores:  Pulmonary Assessment Scores     Row Name 10/03/23 1555         ADL UCSD   ADL Phase Entry     SOB Score total 34     Rest 1     Walk 3     Stairs 5     Bath 0     Dress 1     Shop 4       CAT Score   CAT Score 24       mMRC Score   mMRC Score 3              UCSD: Self-administered rating of dyspnea associated with activities of daily living (ADLs) 6-point scale (0 = "not at all" to 5 = "maximal or unable to do because of breathlessness")  Scoring Scores range from 0 to 120.  Minimally important difference is 5 units  CAT: CAT can identify the health impairment of COPD patients and is better  correlated with disease progression.  CAT has a scoring range of zero to 40. The CAT score is classified into four groups of low (less than 10), medium (10 - 20), high (21-30) and very high (31-40) based on the impact level of disease on health status. A CAT score over 10 suggests significant symptoms.  A worsening CAT score could be explained by an exacerbation, poor medication adherence, poor inhaler technique, or progression of COPD or comorbid conditions.  CAT MCID is 2 points  mMRC: mMRC (Modified Medical Research Council) Dyspnea Scale is used to assess the degree of baseline functional disability in patients of respiratory disease due to dyspnea. No minimal important difference is established. A decrease in score of 1 point or greater is considered a positive change.   Pulmonary Function Assessment:   Exercise Target Goals: Exercise Program Goal: Individual exercise prescription set using results from initial 6 min walk test and THRR while considering  patient's activity barriers and safety.   Exercise Prescription Goal: Initial exercise prescription builds to 30-45 minutes a day of aerobic activity, 2-3 days per week.  Home exercise guidelines will be given to patient during program as part of exercise prescription that the participant will acknowledge.  Education: Aerobic Exercise: - Group verbal and visual presentation on the components of exercise prescription. Introduces F.I.T.T principle from ACSM for exercise prescriptions.  Reviews F.I.T.T. principles of aerobic exercise including progression. Written material given at graduation.   Education: Resistance Exercise: - Group verbal and visual presentation on the components of exercise prescription. Introduces F.I.T.T principle from ACSM for exercise prescriptions  Reviews F.I.T.T. principles of resistance exercise including progression. Written material given at graduation. Flowsheet Row Pulmonary Rehab from 10/11/2023 in [facility]  Cardiac and Pulmonary Rehab  Date 10/11/23  Educator MB  Instruction Review Code 1- [employer] Understanding        Education: Exercise & Equipment Safety: - Individual verbal instruction and demonstration of equipment use and safety with use of the equipment. Flowsheet Row Pulmonary Rehab from 10/11/2023 in [facility] Cardiac and Pulmonary Rehab  Date 10/03/23  Educator [facility]  Instruction Review Code 1- Verbalizes Understanding       Education: Exercise Physiology & General Exercise Guidelines: -  Group verbal and written instruction with models to review the exercise physiology of the cardiovascular system and associated critical values. Provides general exercise guidelines with specific guidelines to those with heart or lung disease.    Education: Flexibility, Balance, Mind/Body Relaxation: - Group verbal and visual presentation with interactive activity on the components of exercise prescription. Introduces F.I.T.T principle from ACSM for exercise prescriptions. Reviews F.I.T.T. principles of flexibility and balance exercise training including progression. Also discusses the mind body connection.  Reviews various relaxation techniques to help reduce and manage stress (i.e. Deep breathing, progressive muscle relaxation, and visualization). Balance handout provided to take home. Written material given at graduation. Flowsheet Row Pulmonary Rehab from 10/11/2023 in [facility] Cardiac and Pulmonary Rehab  Date 10/11/23  Educator MB  Instruction Review Code 1- Verbalizes Understanding       Activity Barriers & Risk Stratification:  Activity Barriers & Cardiac Risk Stratification - 10/03/23 1603       Activity Barriers & Cardiac Risk Stratification   Activity Barriers Arthritis             6 Minute Walk:  6 Minute Walk     Row Name 10/03/23 1600         6 Minute Walk   Phase Initial     Distance 1060 feet     Walk Time 6 minutes     # of Rest Breaks 0     MPH 2     METS 2.1      RPE 13     Perceived Dyspnea  1     VO2 Peak 7.4     Symptoms Yes (comment)     Comments Coughing     Resting HR 82 bpm     Resting BP 122/74     Resting Oxygen Saturation  94 %     Exercise Oxygen Saturation  during 6 min walk 91 %     Max Ex. HR 106 bpm     Max Ex. BP 132/72     2 Minute Post BP 130/72       Interval HR   1 Minute HR 97     2 Minute HR 102     3 Minute HR 106     4 Minute HR 105     5 Minute HR 106     6 Minute HR 107     Interval Heart Rate? Yes       Interval Oxygen   Interval Oxygen? Yes     Baseline Oxygen Saturation % 94 %     1 Minute Oxygen Saturation % 92 %     1 Minute Liters of Oxygen 0 L     2 Minute Oxygen Saturation % 91 %     2 Minute Liters of Oxygen 0 L     3 Minute Oxygen Saturation % 93 %     3 Minute Liters of Oxygen 0 L     4 Minute Oxygen Saturation % 93 %     4 Minute Liters of Oxygen 0 L     5 Minute Oxygen Saturation % 92 %     5 Minute Liters of Oxygen 0 L     6 Minute Oxygen Saturation % 91 %     6 Minute Liters of Oxygen 0 L     2 Minute Post Oxygen Saturation % 93 %     2 Minute Post Liters of Oxygen 0 L  Oxygen Initial Assessment:  Oxygen Initial Assessment - 10/03/23 1613       Home Oxygen   Home Oxygen Device None    Sleep Oxygen Prescription None    Home Exercise Oxygen Prescription None    Home Resting Oxygen Prescription None      Initial 6 min Walk   Oxygen Used None      Program Oxygen Prescription   Program Oxygen Prescription None             Oxygen Re-Evaluation:  Oxygen Re-Evaluation     Row Name 10/11/23 0934             Goals/Expected Outcomes   Comments Reviewed PLB technique with pt.  Talked about how it works and it's importance in maintaining their exercise saturations.       Goals/Expected Outcomes Short: Become more profiecient at using PLB. Long: Become independent at using PLB.                Oxygen Discharge (Final Oxygen Re-Evaluation):  Oxygen  Re-Evaluation - 10/11/23 0934       Goals/Expected Outcomes   Comments Reviewed PLB technique with pt.  Talked about how it works and it's importance in maintaining their exercise saturations.    Goals/Expected Outcomes Short: Become more profiecient at using PLB. Long: Become independent at using PLB.             Initial Exercise Prescription:  Initial Exercise Prescription - 10/03/23 1600       Date of Initial Exercise RX and Referring Provider   Date 10/03/23    Referring Provider Dr. Vida Rigger      Oxygen   Maintain Oxygen Saturation 88% or higher      Recumbant Bike   Level 2    RPM 50    Watts 25    Minutes 15    METs 2.1      REL-XR   Level 1    Speed 50    Minutes 15    METs 2.1      T5 Nustep   Level 2    SPM 80    Minutes 15    METs 2.1      Track   Laps 20    Minutes 15    METs 2.1      Prescription Details   Duration Progress to 30 minutes of continuous aerobic without signs/symptoms of physical distress      Intensity   THRR 40-80% of Max Heartrate 106-131    Ratings of Perceived Exertion 11-13    Perceived Dyspnea 0-4      Progression   Progression Continue to progress workloads to maintain intensity without signs/symptoms of physical distress.      Resistance Training   Training Prescription Yes    Weight 2 lb    Reps 10-15             Perform Capillary Blood Glucose checks as needed.  Exercise Prescription Changes:   Exercise Prescription Changes     Row Name 10/03/23 1600             Response to Exercise   Blood Pressure (Admit) 122/74       Blood Pressure (Exercise) 132/72       Blood Pressure (Exit) 130/72       Heart Rate (Admit) 82 bpm       Heart Rate (Exercise) 106 bpm       Heart Rate (Exit) 84 bpm  Oxygen Saturation (Admit) 94 %       Oxygen Saturation (Exercise) 91 %       Oxygen Saturation (Exit) 93 %       Rating of Perceived Exertion (Exercise) 13       Perceived Dyspnea (Exercise) 1        Symptoms coughing       Comments results         Progression   Average METs 2.1                Exercise Comments:   Exercise Comments     Row Name 10/11/23 0933           Exercise Comments First full day of exercise!  Patient was oriented to gym and equipment including functions, settings, policies, and procedures.  Patient's individual exercise prescription and treatment plan were reviewed.  All starting workloads were established based on the results of the 6 minute walk test done at initial orientation visit.  The plan for exercise progression was also introduced and progression will be customized based on patient's performance and goals.                Exercise Goals and Review:   Exercise Goals     Row Name 10/03/23 1607             Exercise Goals   Increase Physical Activity Yes       Intervention Develop an individualized exercise prescription for aerobic and resistive training based on initial evaluation findings, risk stratification, comorbidities and participant's personal goals.;Provide advice, education, support and counseling about physical activity/exercise needs.       Expected Outcomes Long Term: Exercising regularly at least 3-5 days a week.;Short Term: Attend rehab on a regular basis to increase amount of physical activity.;Long Term: Add in home exercise to make exercise part of routine and to increase amount of physical activity.       Increase Strength and Stamina Yes       Intervention Develop an individualized exercise prescription for aerobic and resistive training based on initial evaluation findings, risk stratification, comorbidities and participant's personal goals.;Provide advice, education, support and counseling about physical activity/exercise needs.       Expected Outcomes Long Term: Improve cardiorespiratory fitness, muscular endurance and strength as measured by increased METs and functional capacity ( );Short Term: Perform  resistance training exercises routinely during rehab and add in resistance training at home;Short Term: Increase workloads from initial exercise prescription for resistance, speed, and METs.       Able to understand and use rate of perceived exertion (RPE) scale Yes       Intervention Provide education and explanation on how to use RPE scale       Expected Outcomes Long Term:  Able to use RPE to guide intensity level when exercising independently;Short Term: Able to use RPE daily in rehab to express subjective intensity level       Able to understand and use Dyspnea scale Yes       Intervention Provide education and explanation on how to use Dyspnea scale       Expected Outcomes Long Term: Able to use Dyspnea scale to guide intensity level when exercising independently;Short Term: Able to use Dyspnea scale daily in rehab to express subjective sense of shortness of breath during exertion       Knowledge and understanding of Target Heart Rate Range (THRR) Yes       Intervention Provide education  and explanation of THRR including how the numbers were predicted and where they are located for reference       Expected Outcomes Long Term: Able to use THRR to govern intensity when exercising independently;Short Term: Able to use daily as guideline for intensity in rehab;Short Term: Able to state/look up THRR       Able to check pulse independently Yes       Intervention Review the importance of being able to check your own pulse for safety during independent exercise;Provide education and demonstration on how to check pulse in carotid and radial arteries.       Expected Outcomes Long Term: Able to check pulse independently and accurately;Short Term: Able to explain why pulse checking is important during independent exercise       Understanding of Exercise Prescription Yes       Intervention Provide education, explanation, and written materials on patient's individual exercise prescription       Expected  Outcomes Long Term: Able to explain home exercise prescription to exercise independently;Short Term: Able to explain program exercise prescription                Exercise Goals Re-Evaluation :  Exercise Goals Re-Evaluation     Row Name 10/11/23 0933             Exercise Goal Re-Evaluation   Exercise Goals Review Able to understand and use rate of perceived exertion (RPE) scale;Able to understand and use Dyspnea scale;Knowledge and understanding of Target Heart Rate Range (THRR);Understanding of Exercise Prescription       Comments Reviewed RPE and dyspnea scale, THR and program prescription with pt today.  Pt voiced understanding and was given a copy of goals to take home.       Expected Outcomes Short: Use RPE daily to regulate intensity. Long: Follow program prescription in THR.                Discharge Exercise Prescription (Final Exercise Prescription Changes):  Exercise Prescription Changes - 10/03/23 1600       Response to Exercise   Blood Pressure (Admit) 122/74    Blood Pressure (Exercise) 132/72    Blood Pressure (Exit) 130/72    Heart Rate (Admit) 82 bpm    Heart Rate (Exercise) 106 bpm    Heart Rate (Exit) 84 bpm    Oxygen Saturation (Admit) 94 %    Oxygen Saturation (Exercise) 91 %    Oxygen Saturation (Exit) 93 %    Rating of Perceived Exertion (Exercise) 13    Perceived Dyspnea (Exercise) 1    Symptoms coughing    Comments results      Progression   Average METs 2.1             Nutrition:  Target Goals: Understanding of nutrition guidelines, daily intake of sodium 1500mg , cholesterol 200mg , calories 30% from fat and 7% or less from saturated fats, daily to have 5 or more servings of fruits and vegetables.  Education: All About Nutrition: -Group instruction provided by verbal, written material, interactive activities, discussions, models, and posters to present general guidelines for heart healthy nutrition including fat, fiber, MyPlate,  the role of sodium in heart healthy nutrition, utilization of the nutrition label, and utilization of this knowledge for meal planning. Follow up email sent as well. Written material given at graduation.   Biometrics:  Pre Biometrics - 10/03/23 1609       Pre Biometrics   Height 5' 3.4" (1.61 m)  Weight 160 lb 9.6 oz (72.8 kg)    Waist Circumference 39.4 inches    Hip Circumference 41.5 inches    Waist to Hip Ratio 0.95 %    BMI (Calculated) 28.1    Single Leg Stand 2.88 seconds              Nutrition Therapy Plan and Nutrition Goals:   Nutrition Assessments:  MEDIFICTS Score Key: >=70 Need to make dietary changes  40-70 Heart Healthy Diet <= 40 Therapeutic Level Cholesterol Diet  Flowsheet Row Pulmonary Rehab from 10/03/2023 in [facility] Cardiac and Pulmonary Rehab  Picture Your Plate Total Score on Admission 60      Picture Your Plate Scores: <88 Unhealthy dietary pattern with much room for improvement. 41-50 Dietary pattern unlikely to meet recommendations for good health and room for improvement. 51-60 More healthful dietary pattern, with some room for improvement.  >60 Healthy dietary pattern, although there may be some specific behaviors that could be improved.   Nutrition Goals Re-Evaluation:   Nutrition Goals Discharge (Final Nutrition Goals Re-Evaluation):   Psychosocial: Target Goals: Acknowledge presence or absence of significant depression and/or stress, maximize coping skills, provide positive support system. Participant is able to verbalize types and ability to use techniques and skills needed for reducing stress and depression.   Education: Stress, Anxiety, and Depression - Group verbal and visual presentation to define topics covered.  Reviews how body is impacted by stress, anxiety, and depression.  Also discusses healthy ways to reduce stress and to treat/manage anxiety and depression.  Written material given at graduation.   Education: Sleep  Hygiene -Provides group verbal and written instruction about how sleep can affect your health.  Define sleep hygiene, discuss sleep cycles and impact of sleep habits. Review good sleep hygiene tips.    Initial Review & Psychosocial Screening:  Initial Psych Review & Screening - 10/01/23 1415       Initial Review   Current issues with None Identified      Family Dynamics   Good Support System? Yes   husband, grandson and grand-daughter     Screening Interventions   Interventions Encouraged to exercise    Expected Outcomes Short Term goal: Utilizing psychosocial counselor, staff and physician to assist with identification of specific Stressors or current issues interfering with healing process. Setting desired goal for each stressor or current issue identified.;Long Term Goal: Stressors or current issues are controlled or eliminated.;Short Term goal: Identification and review with participant of any Quality of Life or Depression concerns found by scoring the questionnaire.;Long Term goal: The participant improves quality of Life and PHQ9 Scores as seen by post scores and/or verbalization of changes             Quality of Life Scores:  Scores of 19 and below usually indicate a poorer quality of life in these areas.  A difference of  2-3 points is a clinically meaningful difference.  A difference of 2-3 points in the total score of the Quality of Life Index has been associated with significant improvement in overall quality of life, self-image, physical symptoms, and general health in studies assessing change in quality of life.  PHQ-9: Review Flowsheet  More data exists      10/03/2023 03/30/2023 03/20/2023 01/02/2023 06/13/2022  Depression screen PHQ 2/9  Decreased Interest 0 0 0 0 0  Down, Depressed, Hopeless 0 0 0 0 0  PHQ - 2 Score 0 0 0 0 0  Altered sleeping 0 0 0 0 0  Tired, decreased energy 2 0 0 0 0  Change in appetite 0 0 0 0 0  Feeling bad or failure about yourself  0 0 0 0  0  Trouble concentrating 0 0 0 0 0  Moving slowly or fidgety/restless 0 0 0 0 0  Suicidal thoughts 0 0 0 0 0  PHQ-9 Score 2 0 0 0 0  Difficult doing work/chores Somewhat difficult Not difficult at all Not difficult at all Not difficult at all Not difficult at all    Details           Interpretation of Total Score  Total Score Depression Severity:  1-4 = Minimal depression, 5-9 = Mild depression, 10-14 = Moderate depression, 15-19 = Moderately severe depression, 20-27 = Severe depression   Psychosocial Evaluation and Intervention:  Psychosocial Evaluation - 10/01/23 1430       Psychosocial Evaluation & Interventions   Interventions Encouraged to exercise with the program and follow exercise prescription    Comments Nancy Mack is coming to pulmonary rehab for COPD. She has no barriers to attending the program. She reports no concerns with stress, depression, or anxiety. She has a good support system at home that includes her husband and also her grand-daughter and grandson that live with her. Nancy Mack would like to be able to do more activities without getting short of breath and hopes to reach this goal with rehab.    Expected Outcomes Short:  Attend pulmonary rehab for education and exercise  Long:  Develop and maintain positive self care habits    Continue Psychosocial Services  Follow up required by staff             Psychosocial Re-Evaluation:   Psychosocial Discharge (Final Psychosocial Re-Evaluation):   Education: Education Goals: Education classes will be provided on a weekly basis, covering required topics. Participant will state understanding/return demonstration of topics presented.  Learning Barriers/Preferences:   General Pulmonary Education Topics:  Infection Prevention: - Provides verbal and written material to individual with discussion of infection control including proper hand washing and proper equipment cleaning during exercise session. Flowsheet Row  Pulmonary Rehab from 10/11/2023 in [facility] Cardiac and Pulmonary Rehab  Date 10/03/23  Educator [facility]  Instruction Review Code 1- Verbalizes Understanding       Falls Prevention: - Provides verbal and written material to individual with discussion of falls prevention and safety. Flowsheet Row Pulmonary Rehab from 10/11/2023 in [facility] Cardiac and Pulmonary Rehab  Date 10/03/23  Educator [facility]  Instruction Review Code 1- Verbalizes Understanding       Chronic Lung Disease Review: - Group verbal instruction with posters, models, PowerPoint presentations and videos,  to review new updates, new respiratory medications, new advancements in procedures and treatments. Providing information on websites and "800" numbers for continued self-education. Includes information about supplement oxygen, available portable oxygen systems, continuous and intermittent flow rates, oxygen safety, concentrators, and Medicare reimbursement for oxygen. Explanation of Pulmonary Drugs, including class, frequency, complications, importance of spacers, rinsing mouth after steroid MDI's, and proper cleaning methods for nebulizers. Review of basic lung anatomy and physiology related to function, structure, and complications of lung disease. Review of risk factors. Discussion about methods for diagnosing sleep apnea and types of masks and machines for OSA. Includes a review of the use of types of environmental controls: home humidity, furnaces, filters, dust mite/pet prevention, HEPA vacuums. Discussion about weather changes, air quality and the benefits of nasal washing. Instruction on Warning signs, infection symptoms, calling MD promptly, preventive modes,  and value of vaccinations. Review of effective airway clearance, coughing and/or vibration techniques. Emphasizing that all should Create an Action Plan. Written material given at graduation.   AED/CPR: - Group verbal and written instruction with the use of models to demonstrate  the basic use of the AED with the basic ABC's of resuscitation.    Anatomy and Cardiac Procedures: - Group verbal and visual presentation and models provide information about basic cardiac anatomy and function. Reviews the testing methods done to diagnose heart disease and the outcomes of the test results. Describes the treatment choices: Medical Management, Angioplasty, or Coronary Bypass Surgery for treating various heart conditions including Myocardial Infarction, Angina, Valve Disease, and Cardiac Arrhythmias.  Written material given at graduation.   Medication Safety: - Group verbal and visual instruction to review commonly prescribed medications for heart and lung disease. Reviews the medication, class of the drug, and side effects. Includes the steps to properly store meds and maintain the prescription regimen.  Written material given at graduation.   Other: -Provides group and verbal instruction on various topics (see comments)   Knowledge Questionnaire Score:  Knowledge Questionnaire Score - 10/11/23 1040       Knowledge Questionnaire Score   Pre Score 10/18              Core Components/Risk Factors/Patient Goals at Admission:  Personal Goals and Risk Factors at Admission - 10/01/23 1419       Core Components/Risk Factors/Patient Goals on Admission    Weight Management Yes    Intervention Weight Management: Develop a combined nutrition and exercise program designed to reach desired caloric intake, while maintaining appropriate intake of nutrient and fiber, sodium and fats, and appropriate energy expenditure required for the weight goal.;Weight Management: Provide education and appropriate resources to help participant work on and attain dietary goals.;Weight Management/Obesity: Establish reasonable short term and long term weight goals.    Admit Weight 157 lb (71.2 kg)    Goal Weight: Short Term 152 lb (68.9 kg)    Goal Weight: Long Term 147 lb (66.7 kg)    Expected  Outcomes Short Term: Continue to assess and modify interventions until short term weight is achieved;Long Term: Adherence to nutrition and physical activity/exercise program aimed toward attainment of established weight goal;Weight Maintenance: Understanding of the daily nutrition guidelines, which includes 25-35% calories from fat, 7% or less cal from saturated fats, less than 200mg  cholesterol, less than 1.5gm of sodium, & 5 or more servings of fruits and vegetables daily;Weight Loss: Understanding of general recommendations for a balanced deficit meal plan, which promotes 1-2 lb weight loss per week and includes a negative energy balance of [phone] kcal/d;Understanding recommendations for meals to include 15-35% energy as protein, 25-35% energy from fat, 35-60% energy from carbohydrates, less than 200mg  of dietary cholesterol, 20-35 gm of total fiber daily;Understanding of distribution of calorie intake throughout the day with the consumption of 4-5 meals/snacks    Improve shortness of breath with ADL's Yes    Intervention Provide education, individualized exercise plan and daily activity instruction to help decrease symptoms of SOB with activities of daily living.    Expected Outcomes Short Term: Improve cardiorespiratory fitness to achieve a reduction of symptoms when performing ADLs;Long Term: Be able to perform more ADLs without symptoms or delay the onset of symptoms    Increase knowledge of respiratory medications and ability to use respiratory devices properly  Yes    Intervention Provide education and demonstration as needed of appropriate use of medications,  inhalers, and oxygen therapy.    Expected Outcomes Short Term: Achieves understanding of medications use. Understands that oxygen is a medication prescribed by physician. Demonstrates appropriate use of inhaler and oxygen therapy.;Long Term: Maintain appropriate use of medications, inhalers, and oxygen therapy.    Hypertension Yes     Intervention Monitor prescription use compliance.;Provide education on lifestyle modifcations including regular physical activity/exercise, weight management, moderate sodium restriction and increased consumption of fresh fruit, vegetables, and low fat dairy, alcohol moderation, and smoking cessation.    Expected Outcomes Short Term: Continued assessment and intervention until BP is < 140/30mm HG in hypertensive participants. < 130/32mm HG in hypertensive participants with diabetes, heart failure or chronic kidney disease.;Long Term: Maintenance of blood pressure at goal levels.             Education:Diabetes - Individual verbal and written instruction to review signs/symptoms of diabetes, desired ranges of glucose level fasting, after meals and with exercise. Acknowledge that pre and post exercise glucose checks will be done for 3 sessions at entry of program.   Know Your Numbers and Heart Failure: - Group verbal and visual instruction to discuss disease risk factors for cardiac and pulmonary disease and treatment options.  Reviews associated critical values for Overweight/Obesity, Hypertension, Cholesterol, and Diabetes.  Discusses basics of heart failure: signs/symptoms and treatments.  Introduces Heart Failure Zone chart for action plan for heart failure.  Written material given at graduation.   Core Components/Risk Factors/Patient Goals Review:    Core Components/Risk Factors/Patient Goals at Discharge (Final Review):    ITP Comments:  ITP Comments     Row Name 10/01/23 1427 10/03/23 1559 10/11/23 0933 10/17/23 1239     ITP Comments Initial phone call completed. [facility]x [facility]9. EP orientation scheduled for 10/16 at 2:30 PM. Completed and gym orientation. Initial ITP created and sent for review to Dr. Jinny Sanders, Medical Director. First full day of exercise!  Patient was oriented to gym and equipment including functions, settings, policies, and procedures.   Patient's individual exercise prescription and treatment plan were reviewed.  All starting workloads were established based on the results of the 6 minute walk test done at initial orientation visit.  The plan for exercise progression was also introduced and progression will be customized based on patient's performance and goals. 30 Day review completed. Medical Director ITP review done, changes made as directed, and signed approval by Medical Director.    new to program             Comments:

## 2023-10-18 ENCOUNTER — Encounter: Payer: Medicare Other | Admitting: *Deleted

## 2023-10-18 DIAGNOSIS — J449 Chronic obstructive pulmonary disease, unspecified: Secondary | ICD-10-CM | POA: Diagnosis not present

## 2023-10-18 NOTE — Progress Notes (Signed)
Daily Session Note  Patient Details  Name: Nancy Mack MRN: [id number] Date of Birth: September 11, 1946 Referring Provider:   Flowsheet Row Pulmonary Rehab from 10/03/2023 in [facility] Cardiac and Pulmonary Rehab  Referring Provider Dr. Vida Rigger       Encounter Date: 10/18/2023  Check In:  Session Check In - 10/18/23 0949       Check-In   Supervising physician immediately available to respond to emergencies See telemetry face sheet for immediately available ER MD    Location [facility]-Cardiac & Pulmonary Rehab    Staff Present Cora Collum, RN, BSN, CCRP;Joseph Hood, RCP,RRT,BSRT;Maxon Manchester BS, , Exercise Physiologist;Margi Edmundson Katrinka Blazing, RN, [place]    Virtual Visit No    Medication changes reported     No    Fall or balance concerns reported    No    Warm-up and Cool-down Performed on first and last piece of equipment    Resistance Training Performed Yes    VAD Patient? No    PAD/SET Patient? No      Pain Assessment   Currently in Pain? No/denies                Social History   Tobacco Use  Smoking Status Former   Current packs/day: 0.00   Average packs/day: 2.0 packs/day for 2.0 years (4.0 ttl pk-yrs)   Types: Cigarettes   Start date: 12/18/1976   Quit date: 12/18/1978   Years since quitting: 44.8  Smokeless Tobacco Never  Tobacco Comments   quit in 1980    Goals Met:  Independence with exercise equipment Exercise tolerated well No report of concerns or symptoms today Strength training completed today  Goals Unmet:  Not Applicable  Comments: Pt able to follow exercise prescription today without complaint.  Will continue to monitor for progression.    Dr. Bethann Punches is Medical Director for [facility] Cardiac Rehabilitation.  Dr. Vida Rigger is Medical Director for [facility] Pulmonary Rehabilitation.

## 2023-10-25 DIAGNOSIS — R0602 Shortness of breath: Secondary | ICD-10-CM | POA: Diagnosis not present

## 2023-10-25 DIAGNOSIS — E782 Mixed hyperlipidemia: Secondary | ICD-10-CM | POA: Diagnosis not present

## 2023-10-25 DIAGNOSIS — I700 Atherosclerosis of aorta: Secondary | ICD-10-CM | POA: Diagnosis not present

## 2023-10-25 DIAGNOSIS — I10 Essential (primary) hypertension: Secondary | ICD-10-CM | POA: Diagnosis not present

## 2023-10-25 DIAGNOSIS — R002 Palpitations: Secondary | ICD-10-CM | POA: Diagnosis not present

## 2023-10-25 DIAGNOSIS — I38 Endocarditis, valve unspecified: Secondary | ICD-10-CM | POA: Diagnosis not present

## 2023-10-30 ENCOUNTER — Encounter: Payer: Medicare Other | Attending: Pulmonary Disease

## 2023-10-30 DIAGNOSIS — J449 Chronic obstructive pulmonary disease, unspecified: Secondary | ICD-10-CM | POA: Diagnosis not present

## 2023-10-30 NOTE — Progress Notes (Signed)
Daily Session Note  Patient Details  Name: IZEBELLA AAKRE MRN: [id number] Date of Birth: 24-Jan-1946 Referring Provider:   Flowsheet Row Pulmonary Rehab from 10/03/2023 in [facility] Cardiac and Pulmonary Rehab  Referring Provider Dr. Vida Rigger       Encounter Date: 10/30/2023  Check In:  Session Check In - 10/30/23 0932       Check-In   Supervising physician immediately available to respond to emergencies See telemetry face sheet for immediately available ER MD    Location [facility]-Cardiac & Pulmonary Rehab    Staff Present Maxon Conetta BS, , Exercise Physiologist;Margaret Best, MS, Exercise Physiologist;Falicia Lizotte, RN, BSN;Noah Tickle, BS, Exercise Physiologist    Virtual Visit No    Medication changes reported     No    Fall or balance concerns reported    No    Tobacco Cessation No Change    Warm-up and Cool-down Performed on first and last piece of equipment    Resistance Training Performed Yes    VAD Patient? No    PAD/SET Patient? No      Pain Assessment   Currently in Pain? No/denies                Social History   Tobacco Use  Smoking Status Former   Current packs/day: 0.00   Average packs/day: 2.0 packs/day for 2.0 years (4.0 ttl pk-yrs)   Types: Cigarettes   Start date: 12/18/1976   Quit date: 12/18/1978   Years since quitting: 44.8  Smokeless Tobacco Never  Tobacco Comments   quit in 1980    Goals Met:  Proper associated with RPD/PD & O2 Sat Independence with exercise equipment Using PLB without cueing & demonstrates good technique Exercise tolerated well No report of concerns or symptoms today Strength training completed today  Goals Unmet:  Not Applicable  Comments: Pt able to follow exercise prescription today without complaint.  Will continue to monitor for progression.   Dr. Bethann Punches is Medical Director for [facility] Cardiac Rehabilitation.  Dr. Vida Rigger is Medical Director for [facility] Pulmonary Rehabilitation.

## 2023-10-31 ENCOUNTER — Other Ambulatory Visit: Payer: Self-pay | Admitting: Family Medicine

## 2023-11-01 ENCOUNTER — Encounter: Payer: Medicare Other | Admitting: *Deleted

## 2023-11-01 DIAGNOSIS — J449 Chronic obstructive pulmonary disease, unspecified: Secondary | ICD-10-CM | POA: Diagnosis not present

## 2023-11-01 NOTE — Progress Notes (Signed)
Daily Session Note  Patient Details  Name: Nancy Mack MRN: [id number] Date of Birth: 1946/01/31 Referring Provider:   Flowsheet Row Pulmonary Rehab from 10/03/2023 in [facility] Cardiac and Pulmonary Rehab  Referring Provider Dr. Vida Rigger       Encounter Date: 11/01/2023  Check In:  Session Check In - 11/01/23 0931       Check-In   Supervising physician immediately available to respond to emergencies See telemetry face sheet for immediately available ER MD    Location [facility]-Cardiac & Pulmonary Rehab    Staff Present Cora Collum, RN, BSN, CCRP;Margaret Best, MS, Exercise Physiologist;[place]yn House Katrinka Blazing, RN, ADN    Virtual Visit No    Medication changes reported     No    Fall or balance concerns reported    No    Warm-up and Cool-down Performed on first and last piece of equipment    Resistance Training Performed Yes    VAD Patient? No    PAD/SET Patient? No      Pain Assessment   Currently in Pain? No/denies                Social History   Tobacco Use  Smoking Status Former   Current packs/day: 0.00   Average packs/day: 2.0 packs/day for 2.0 years (4.0 ttl pk-yrs)   Types: Cigarettes   Start date: 12/18/1976   Quit date: 12/18/1978   Years since quitting: 44.9  Smokeless Tobacco Never  Tobacco Comments   quit in 1980    Goals Met:  Independence with exercise equipment Exercise tolerated well No report of concerns or symptoms today Strength training completed today  Goals Unmet:  Not Applicable  Comments: Pt able to follow exercise prescription today without complaint.  Will continue to monitor for progression.    Dr. Bethann Punches is Medical Director for [facility] Cardiac Rehabilitation.  Dr. Vida Rigger is Medical Director for [facility] Pulmonary Rehabilitation.

## 2023-11-01 NOTE — Telephone Encounter (Signed)
Requested Prescriptions  Refused Prescriptions Disp Refills   montelukast (SINGULAIR) 10 MG tablet [Pharmacy Med Name: MONTELUKAST SOD 10 MG TABLET] 90 tablet 0    Sig: Take 1 tablet (10 mg total) by mouth at bedtime.     Pulmonology:  Leukotriene Inhibitors Passed - 10/31/2023  9:44 AM      Passed - Valid encounter within last 12 months    Recent Outpatient Visits           7 months ago Menopause   [place] [facility] [place], [place], DO   8 months ago Menopause   [place] [facility] [place], Megan P, DO   10 months ago Mixed hyperlipidemia   [facility] [place], [place], DO   1 year ago Post-nasal drip   [place] [facility] [place], Megan P, DO   1 year ago Mixed hyperlipidemia   [facility] [place], [place], DO

## 2023-11-06 ENCOUNTER — Encounter: Payer: Medicare Other | Admitting: *Deleted

## 2023-11-06 DIAGNOSIS — J449 Chronic obstructive pulmonary disease, unspecified: Secondary | ICD-10-CM

## 2023-11-06 NOTE — Progress Notes (Signed)
Daily Session Note  Patient Details  Name: LYRICS GUERRIER MRN: [id number] Date of Birth: [birth year] Referring Provider:   Flowsheet Row Pulmonary Rehab from 10/03/2023 in [facility] Cardiac and Pulmonary Rehab  Referring Provider Dr. Vida Rigger       Encounter Date: 11/06/2023  Check In:  Session Check In - 11/06/23 0950       Check-In   Supervising physician immediately available to respond to emergencies See telemetry face sheet for immediately available ER MD    Location [facility]-Cardiac & Pulmonary Rehab    Staff Present Cora Collum, RN, BSN, CCRP;Noah Tickle, BS, Exercise Physiologist;Margaret Best, MS, Exercise Physiologist;Maxon Conetta BS, , Exercise Physiologist    Virtual Visit No    Medication changes reported     No    Fall or balance concerns reported    No    Warm-up and Cool-down Performed on first and last piece of equipment    Resistance Training Performed Yes    VAD Patient? No    PAD/SET Patient? No      Pain Assessment   Currently in Pain? No/denies                Social History   Tobacco Use  Smoking Status Former   Current packs/day: 0.00   Average packs/day: 2.0 packs/day for 2.0 years (4.0 ttl pk-yrs)   Types: Cigarettes   Start date: 12/18/1976   Quit date: 12/18/1978   Years since quitting: 44.9  Smokeless Tobacco Never  Tobacco Comments   quit in 1980    Goals Met:  Proper associated with RPD/PD & O2 Sat Independence with exercise equipment Exercise tolerated well No report of concerns or symptoms today  Goals Unmet:  Not Applicable  Comments: Pt able to follow exercise prescription today without complaint.  Will continue to monitor for progression.    Dr. Bethann Punches is Medical Director for [facility] Cardiac Rehabilitation.  Dr. Vida Rigger is Medical Director for [facility] Pulmonary Rehabilitation.

## 2023-11-07 ENCOUNTER — Encounter: Payer: Self-pay | Admitting: Family Medicine

## 2023-11-07 ENCOUNTER — Encounter: Payer: Self-pay | Admitting: *Deleted

## 2023-11-07 ENCOUNTER — Ambulatory Visit: Payer: Medicare Other | Admitting: Family Medicine

## 2023-11-07 VITALS — BP 135/79 | HR 99 | Temp 97.9°F | Resp 14 | Wt 155.8 lb

## 2023-11-07 DIAGNOSIS — J449 Chronic obstructive pulmonary disease, unspecified: Secondary | ICD-10-CM

## 2023-11-07 DIAGNOSIS — N1831 Chronic kidney disease, stage 3a: Secondary | ICD-10-CM | POA: Diagnosis not present

## 2023-11-07 DIAGNOSIS — I10 Essential (primary) hypertension: Secondary | ICD-10-CM | POA: Diagnosis not present

## 2023-11-07 DIAGNOSIS — E559 Vitamin D deficiency, unspecified: Secondary | ICD-10-CM | POA: Diagnosis not present

## 2023-11-07 DIAGNOSIS — K219 Gastro-esophageal reflux disease without esophagitis: Secondary | ICD-10-CM

## 2023-11-07 DIAGNOSIS — I700 Atherosclerosis of aorta: Secondary | ICD-10-CM | POA: Diagnosis not present

## 2023-11-07 DIAGNOSIS — Z780 Asymptomatic menopausal state: Secondary | ICD-10-CM | POA: Diagnosis not present

## 2023-11-07 DIAGNOSIS — E782 Mixed hyperlipidemia: Secondary | ICD-10-CM

## 2023-11-07 MED ORDER — HYDROCHLOROTHIAZIDE 25 MG PO TABS: 25.0000 mg | ORAL_TABLET | Freq: Every day | ORAL | 1 refills | Status: DC

## 2023-11-07 MED ORDER — ATORVASTATIN CALCIUM 80 MG PO TABS: 80.0000 mg | ORAL_TABLET | Freq: Every day | ORAL | 1 refills | Status: DC

## 2023-11-07 MED ORDER — MONTELUKAST SODIUM 10 MG PO TABS: 10.0000 mg | ORAL_TABLET | Freq: Every evening | ORAL | 1 refills | Status: DC

## 2023-11-07 MED ORDER — FLUTICASONE PROPIONATE 50 MCG/ACT NA SUSP: 1.0000 | Freq: Two times a day (BID) | NASAL | 12 refills | Status: DC

## 2023-11-07 MED ORDER — VENLAFAXINE HCL ER 37.5 MG PO CP24: 37.5000 mg | ORAL_CAPSULE | Freq: Every day | ORAL | 3 refills | Status: DC

## 2023-11-07 NOTE — Progress Notes (Signed)
BP 135/79 (BP Location: Left Arm, Patient Position: Sitting, Cuff Size: Normal)   Pulse 99   Temp 97.9 F (36.6 C) (Oral)   Resp 14   Wt 155 lb 12.8 oz (70.7 kg)   LMP 07/31/1981 (Approximate)   SpO2 98%   BMI 27.25 kg/m    Subjective:    Patient ID: Nancy Mack, female    DOB: 1946-11-29, 77 y.o.   MRN: [id number]  HPI: Nancy Mack is a 77 y.o. female  Chief Complaint  Patient presents with   Medication Refill    Would like refill of montelukast (SINGULAIR) 10 MG tablet but required appointment first   Edema    Right hand near thumb. Ongoing for a couple months, also with pain.    Hot Flashes    Stopped taking estradiol due to all the side effects and d/c but then hot flashes came right back.    HYPERTENSION / HYPERLIPIDEMIA Satisfied with current treatment? yes Duration of hypertension: chronic BP monitoring frequency: not checking BP medication side effects: no Past BP meds: HCTZ Duration of hyperlipidemia: chronic Cholesterol medication side effects: no Cholesterol supplements: none Past cholesterol medications: atorvastatin Medication compliance: excellent compliance Aspirin: yes Recent stressors: no Recurrent headaches: no Visual changes: no Palpitations: no Dyspnea: no Chest pain: no Lower extremity edema: no Dizzy/lightheaded: no  MENOPAUSAL SYMPTOMS Duration: uncontrolled Symptom severity: moderate Hot flashes: yes Night sweats: yes Sleep disturbances: yes Vaginal dryness: no Dyspareunia:no Decreased libido: no Emotional lability: yes Stress incontinence: no Previous HRT/pharmacotherapy: yes Hysterectomy: no Absolute Contraindications to Hormonal Therapy:     Undiagnosed vaginal bleeding: no    Breast cancer: no    Endometrial cancer: no    Coronary disease: no    Cerebrovascular disease: no    Venous thromboembolic disease: no  Relevant past medical, surgical, family and social history reviewed and updated as indicated.  Interim medical history since our last visit reviewed. Allergies and medications reviewed and updated.  Review of Systems  Constitutional:  Positive for diaphoresis. Negative for activity change, appetite change, chills, fatigue, fever and unexpected weight change.  Respiratory: Negative.    Cardiovascular: Negative.   Gastrointestinal: Negative.   Musculoskeletal: Negative.   Neurological: Negative.   Psychiatric/Behavioral: Negative.      Per HPI unless specifically indicated above     Objective:    BP 135/79 (BP Location: Left Arm, Patient Position: Sitting, Cuff Size: Normal)   Pulse 99   Temp 97.9 F (36.6 C) (Oral)   Resp 14   Wt 155 lb 12.8 oz (70.7 kg)   LMP 07/31/1981 (Approximate)   SpO2 98%   BMI 27.25 kg/m   Wt Readings from Last 3 Encounters:  11/07/23 155 lb 12.8 oz (70.7 kg)  10/03/23 160 lb 9.6 oz (72.8 kg)  03/30/23 153 lb 12.8 oz (69.8 kg)    Physical Exam Vitals and nursing note reviewed.  Constitutional:      General: She is not in acute distress.    Appearance: Normal appearance. She is not ill-appearing, toxic-appearing or diaphoretic.  HENT:     Head: Normocephalic and atraumatic.     Right Ear: External ear normal.     Left Ear: External ear normal.     Nose: Nose normal.     Mouth/Throat:     Mouth: Mucous membranes are moist.     Pharynx: Oropharynx is clear.  Eyes:     General: No scleral icterus.       Right eye: No  discharge.        Left eye: No discharge.     Extraocular Movements: Extraocular movements intact.     Conjunctiva/sclera: Conjunctivae normal.     Pupils: Pupils are equal, round, and reactive to light.  Cardiovascular:     Rate and Rhythm: Normal rate and regular rhythm.     Pulses: Normal pulses.     Heart sounds: Normal heart sounds. No murmur heard.    No friction rub. No gallop.  Pulmonary:     Effort: Pulmonary effort is normal. No respiratory distress.     Breath sounds: Normal breath sounds. No stridor. No  wheezing, rhonchi or rales.  Chest:     Chest wall: No tenderness.  Musculoskeletal:        General: Normal range of motion.     Cervical back: Normal range of motion and neck supple.  Skin:    General: Skin is warm and dry.     Capillary Refill: Capillary refill takes less than 2 seconds.     Coloration: Skin is not jaundiced or pale.     Findings: No bruising, erythema, lesion or rash.  Neurological:     General: No focal deficit present.     Mental Status: She is alert and oriented to person, place, and time. Mental status is at baseline.  Psychiatric:        Mood and Affect: Mood normal.        Behavior: Behavior normal.        Thought Content: Thought content normal.        Judgment: Judgment normal.     Results for orders placed or performed in visit on 11/07/23  Lipid Panel w/o Chol/HDL Ratio  Result Value Ref Range   Cholesterol, Total 169 100 - 199 mg/dL   Triglycerides 161 0 - 149 mg/dL   HDL 62 >09 mg/dL   VLDL Cholesterol Cal 19 5 - 40 mg/dL   LDL Chol Calc (NIH) 88 0 - 99 mg/dL  CBC with Differential/Platelet  Result Value Ref Range   WBC 6.2 3.4 - 10.8 x10E3/uL   RBC 4.80 3.77 - 5.28 x10E6/uL   Hemoglobin 12.4 11.1 - 15.9 g/dL   Hematocrit 60.4 54.0 - 46.6 %   MCV 83 79 - 97 fL   MCH 25.8 (L) 26.6 - 33.0 pg   MCHC 31.1 (L) 31.5 - 35.7 g/dL   RDW 98.1 19.1 - 47.8 %   Platelets 362 150 - 450 x10E3/uL   Neutrophils 63 Not Estab. %   Lymphs 21 Not Estab. %   Monocytes 9 Not Estab. %   Eos 6 Not Estab. %   Basos 1 Not Estab. %   Neutrophils Absolute 3.9 1.4 - 7.0 x10E3/uL   Lymphocytes Absolute 1.3 0.7 - 3.1 x10E3/uL   Monocytes Absolute 0.5 0.1 - 0.9 x10E3/uL   EOS (ABSOLUTE) 0.4 0.0 - 0.4 x10E3/uL   Basophils Absolute 0.1 0.0 - 0.2 x10E3/uL   Immature Granulocytes 0 Not Estab. %   Immature Grans (Abs) 0.0 0.0 - 0.1 x10E3/uL  Comprehensive metabolic panel  Result Value Ref Range   Glucose 95 70 - 99 mg/dL   BUN 10 8 - 27 mg/dL   Creatinine, Ser 2.95  0.57 - 1.00 mg/dL   eGFR 61 >62 ZH/YQM/5.78   BUN/Creatinine Ratio 10 (L) 12 - 28   Sodium 142 134 - 144 mmol/L   Potassium 4.2 3.5 - 5.2 mmol/L   Chloride 106 96 - 106 mmol/L  CO2 24 20 - 29 mmol/L   Calcium 9.6 8.7 - 10.3 mg/dL   Total Protein 6.0 6.0 - 8.5 g/dL   Albumin 4.0 3.8 - 4.8 g/dL   Globulin, Total 2.0 1.5 - 4.5 g/dL   Bilirubin Total 0.3 0.0 - 1.2 mg/dL   Alkaline Phosphatase 118 44 - 121 IU/L   AST 28 0 - 40 IU/L   ALT 20 0 - 32 IU/L  VITAMIN D 25 Hydroxy (Vit-D Deficiency, Fractures)  Result Value Ref Range   Vit D, 25-Hydroxy 38.8 30.0 - 100.0 ng/mL      Assessment & Plan:   Problem List Items Addressed This Visit       Cardiovascular and Mediastinum   Benign essential HTN    Under good control on current regimen. Continue current regimen. Continue to monitor. Call with any concerns. Refills given. Labs drawn today.       Relevant Medications   hydrochlorothiazide (HYDRODIURIL) 25 MG tablet   atorvastatin (LIPITOR) 80 MG tablet   Other Relevant Orders   CBC with Differential/Platelet (Completed)   Comprehensive metabolic panel (Completed)   Aortic atherosclerosis (HCC) - Primary    Will keep her BP and cholesterol under good control. Continue to monitor. Call with any concerns.       Relevant Medications   hydrochlorothiazide (HYDRODIURIL) 25 MG tablet   atorvastatin (LIPITOR) 80 MG tablet   Other Relevant Orders   CBC with Differential/Platelet (Completed)   Comprehensive metabolic panel (Completed)     Digestive   GERD (gastroesophageal reflux disease)    Under good control on current regimen. Continue current regimen. Continue to monitor. Call with any concerns. Refills given. Labs drawn today.        Relevant Orders   CBC with Differential/Platelet (Completed)   Comprehensive metabolic panel (Completed)     Genitourinary   Stage 3a chronic kidney disease (HCC)    Rechecking labs today. Await results.       Relevant Orders   CBC with  Differential/Platelet (Completed)   Comprehensive metabolic panel (Completed)     Other   Hyperlipidemia    Under good control on current regimen. Continue current regimen. Continue to monitor. Call with any concerns. Refills given. Labs drawn today.       Relevant Medications   hydrochlorothiazide (HYDRODIURIL) 25 MG tablet   atorvastatin (LIPITOR) 80 MG tablet   Other Relevant Orders   Lipid Panel w/o Chol/HDL Ratio (Completed)   CBC with Differential/Platelet (Completed)   Comprehensive metabolic panel (Completed)   Menopause    Stopped her estradiol because she was afraid of the side effects. She has not been doing well. Has never been on effexor- will start low dose and recheck in about 6 weeks. Call with any concerns.       Vitamin D deficiency    Rechecking labs today. Await results. Treat as needed.       Relevant Orders   CBC with Differential/Platelet (Completed)   Comprehensive metabolic panel (Completed)   VITAMIN D 25 Hydroxy (Vit-D Deficiency, Fractures) (Completed)     Follow up plan: Return in about 6 weeks (around 12/19/2023).

## 2023-11-07 NOTE — Progress Notes (Signed)
Pulmonary Individual Treatment Plan  Patient Details  Name: Nancy Mack MRN: [id number] Date of Birth: 1946-02-04 Referring Provider:   Flowsheet Row Pulmonary Rehab from 10/03/2023 in [facility] Cardiac and Pulmonary Rehab  Referring Provider Dr. Vida Rigger       Initial Encounter Date:  Flowsheet Row Pulmonary Rehab from 10/03/2023 in [facility] Cardiac and Pulmonary Rehab  Date 10/03/23       Visit Diagnosis: Chronic obstructive pulmonary disease, unspecified COPD type (HCC)  Patient's Home Medications on Admission:  Current Outpatient Medications:    acetaminophen (TYLENOL) 500 MG tablet, Take 1,000 mg by mouth every 6 (six) hours as needed., Disp: , Rfl:    albuterol (VENTOLIN HFA) 108 (90 Base) MCG/ACT inhaler, Inhale 2 puffs into the lungs every 6 (six) hours as needed., Disp: 25.5 g, Rfl: 12   Ascorbic Acid (VITAMIN C) 100 MG tablet, Take 100 mg by mouth daily., Disp: , Rfl:    aspirin EC 81 MG tablet, Take 81 mg by mouth daily. Swallow whole., Disp: , Rfl:    atorvastatin (LIPITOR) 80 MG tablet, Take 1 tablet (80 mg total) by mouth daily., Disp: , Rfl:    azelastine (ASTELIN) 0.1 % nasal spray, Place 1 spray 2 (two) times daily into both nostrils. Use in each nostril as directed, Disp: 90 mL, Rfl: 0   Biotin 1000 MCG tablet, Take 1,000 mcg by mouth daily., Disp: , Rfl:    Cholecalciferol (VITAMIN D) 50 MCG (2000 UT) tablet, Take 2,000 Units by mouth daily., Disp: , Rfl:    fluticasone (FLONASE) 50 MCG/ACT nasal spray, Place 2 sprays into both nostrils daily. (Patient taking differently: Place 1 spray into both nostrils in the morning and at bedtime.), Disp: 48 g, Rfl: 12   Fluticasone-Umeclidin-Vilant (TRELEGY ELLIPTA) 100-62.5-25 MCG/ACT AEPB, Inhale 1 puff into the lungs daily., Disp: 3 each, Rfl: 4   latanoprost (XALATAN) 0.005 % ophthalmic solution, SMARTSIG:In Eye(s), Disp: , Rfl:    triamcinolone cream (KENALOG) 0.1 %, Apply 1 application topically 2 (two) times  daily. (Patient taking differently: Apply 1 application  topically 2 (two) times daily as needed (irritation).), Disp: 30 g, Rfl: 0  Past Medical History: Past Medical History:  Diagnosis Date   Allergy    Anemia    Arthritis    Asthma    Carpal tunnel syndrome    Chronic kidney disease    Dyspnea    Eczema    Globus pharyngeus    Hyperlipidemia    Hypertension    Laryngopharyngeal reflux    Menopause    Neuroma    right foot   Osteoporosis    Pneumonia    Raynaud's disease    Rhinitis due to pollen    SOB (shortness of breath)     Tobacco Use: Social History   Tobacco Use  Smoking Status Former   Current packs/day: 0.00   Average packs/day: 2.0 packs/day for 2.0 years (4.0 ttl pk-yrs)   Types: Cigarettes   Start date: 12/18/1976   Quit date: 12/18/1978   Years since quitting: 44.9  Smokeless Tobacco Never  Tobacco Comments   quit in 1980    Labs: Review Flowsheet  More data exists      Latest Ref Rng & Units 11/04/2020 05/04/2021 12/13/2021 06/13/2022 01/02/2023  Labs for ITP Cardiac and Pulmonary Rehab  Cholestrol 100 - 199 mg/dL 762  831  517  616  073   LDL (calc) 0 - 99 mg/dL 710  90  626  948  150   HDL-C >39 mg/dL 66  73  73  63  74   Trlycerides 0 - 149 mg/dL 161  096  95  045  409     Details             Pulmonary Assessment Scores:  Pulmonary Assessment Scores     Row Name 10/03/23 1555         ADL UCSD   ADL Phase Entry     SOB Score total 34     Rest 1     Walk 3     Stairs 5     Bath 0     Dress 1     Shop 4       CAT Score   CAT Score 24       mMRC Score   mMRC Score 3              UCSD: Self-administered rating of dyspnea associated with activities of daily living (ADLs) 6-point scale (0 = "not at all" to 5 = "maximal or unable to do because of breathlessness")  Scoring Scores range from 0 to 120.  Minimally important difference is 5 units  CAT: CAT can identify the health impairment of COPD patients and is better  correlated with disease progression.  CAT has a scoring range of zero to 40. The CAT score is classified into four groups of low (less than 10), medium (10 - 20), high (21-30) and very high (31-40) based on the impact level of disease on health status. A CAT score over 10 suggests significant symptoms.  A worsening CAT score could be explained by an exacerbation, poor medication adherence, poor inhaler technique, or progression of COPD or comorbid conditions.  CAT MCID is 2 points  mMRC: mMRC (Modified Medical Research Council) Dyspnea Scale is used to assess the degree of baseline functional disability in patients of respiratory disease due to dyspnea. No minimal important difference is established. A decrease in score of 1 point or greater is considered a positive change.   Pulmonary Function Assessment:   Exercise Target Goals: Exercise Program Goal: Individual exercise prescription set using results from initial 6 min walk test and THRR while considering  patient's activity barriers and safety.   Exercise Prescription Goal: Initial exercise prescription builds to 30-45 minutes a day of aerobic activity, 2-3 days per week.  Home exercise guidelines will be given to patient during program as part of exercise prescription that the participant will acknowledge.  Education: Aerobic Exercise: - Group verbal and visual presentation on the components of exercise prescription. Introduces F.I.T.T principle from ACSM for exercise prescriptions.  Reviews F.I.T.T. principles of aerobic exercise including progression. Written material given at graduation. Flowsheet Row Pulmonary Rehab from 10/18/2023 in [facility] Cardiac and Pulmonary Rehab  Date 10/18/23  Educator MB  Instruction Review Code 1- Verbalizes Understanding       Education: Resistance Exercise: - Group verbal and visual presentation on the components of exercise prescription. Introduces F.I.T.T principle from ACSM for exercise  prescriptions  Reviews F.I.T.T. principles of resistance exercise including progression. Written material given at graduation. Flowsheet Row Pulmonary Rehab from 10/18/2023 in [facility] Cardiac and Pulmonary Rehab  Date 10/11/23  Educator MB  Instruction Review Code 1- [employer] Understanding        Education: Exercise & Equipment Safety: - Individual verbal instruction and demonstration of equipment use and safety with use of the equipment. Flowsheet Row Pulmonary Rehab from 10/18/2023 in [facility] Cardiac and  Pulmonary Rehab  Date 10/03/23  Educator [facility]  Instruction Review Code 1- Verbalizes Understanding       Education: Exercise Physiology & General Exercise Guidelines: - Group verbal and written instruction with models to review the exercise physiology of the cardiovascular system and associated critical values. Provides general exercise guidelines with specific guidelines to those with heart or lung disease.    Education: Flexibility, Balance, Mind/Body Relaxation: - Group verbal and visual presentation with interactive activity on the components of exercise prescription. Introduces F.I.T.T principle from ACSM for exercise prescriptions. Reviews F.I.T.T. principles of flexibility and balance exercise training including progression. Also discusses the mind body connection.  Reviews various relaxation techniques to help reduce and manage stress (i.e. Deep breathing, progressive muscle relaxation, and visualization). Balance handout provided to take home. Written material given at graduation. Flowsheet Row Pulmonary Rehab from 10/18/2023 in [facility] Cardiac and Pulmonary Rehab  Date 10/11/23  Educator MB  Instruction Review Code 1- Verbalizes Understanding       Activity Barriers & Risk Stratification:  Activity Barriers & Cardiac Risk Stratification - 10/03/23 1603       Activity Barriers & Cardiac Risk Stratification   Activity Barriers Arthritis             6 Minute Walk:  6  Minute Walk     Row Name 10/03/23 1600         6 Minute Walk   Phase Initial     Distance 1060 feet     Walk Time 6 minutes     # of Rest Breaks 0     MPH 2     METS 2.1     RPE 13     Perceived Dyspnea  1     VO2 Peak 7.4     Symptoms Yes (comment)     Comments Coughing     Resting HR 82 bpm     Resting BP 122/74     Resting Oxygen Saturation  94 %     Exercise Oxygen Saturation  during 6 min walk 91 %     Max Ex. HR 106 bpm     Max Ex. BP 132/72     2 Minute Post BP 130/72       Interval HR   1 Minute HR 97     2 Minute HR 102     3 Minute HR 106     4 Minute HR 105     5 Minute HR 106     6 Minute HR 107     Interval Heart Rate? Yes       Interval Oxygen   Interval Oxygen? Yes     Baseline Oxygen Saturation % 94 %     1 Minute Oxygen Saturation % 92 %     1 Minute Liters of Oxygen 0 L     2 Minute Oxygen Saturation % 91 %     2 Minute Liters of Oxygen 0 L     3 Minute Oxygen Saturation % 93 %     3 Minute Liters of Oxygen 0 L     4 Minute Oxygen Saturation % 93 %     4 Minute Liters of Oxygen 0 L     5 Minute Oxygen Saturation % 92 %     5 Minute Liters of Oxygen 0 L     6 Minute Oxygen Saturation % 91 %     6 Minute Liters of Oxygen 0 L  2 Minute Post Oxygen Saturation % 93 %     2 Minute Post Liters of Oxygen 0 L             Oxygen Initial Assessment:  Oxygen Initial Assessment - 10/03/23 1613       Home Oxygen   Home Oxygen Device None    Sleep Oxygen Prescription None    Home Exercise Oxygen Prescription None    Home Resting Oxygen Prescription None      Initial 6 min Walk   Oxygen Used None      Program Oxygen Prescription   Program Oxygen Prescription None             Oxygen Re-Evaluation:  Oxygen Re-Evaluation     Row Name 10/11/23 0934             Goals/Expected Outcomes   Comments Reviewed PLB technique with pt.  Talked about how it works and it's importance in maintaining their exercise saturations.        Goals/Expected Outcomes Short: Become more profiecient at using PLB. Long: Become independent at using PLB.                Oxygen Discharge (Final Oxygen Re-Evaluation):  Oxygen Re-Evaluation - 10/11/23 0934       Goals/Expected Outcomes   Comments Reviewed PLB technique with pt.  Talked about how it works and it's importance in maintaining their exercise saturations.    Goals/Expected Outcomes Short: Become more profiecient at using PLB. Long: Become independent at using PLB.             Initial Exercise Prescription:  Initial Exercise Prescription - 10/03/23 1600       Date of Initial Exercise RX and Referring Provider   Date 10/03/23    Referring Provider Dr. Vida Rigger      Oxygen   Maintain Oxygen Saturation 88% or higher      Recumbant Bike   Level 2    RPM 50    Watts 25    Minutes 15    METs 2.1      REL-XR   Level 1    Speed 50    Minutes 15    METs 2.1      T5 Nustep   Level 2    SPM 80    Minutes 15    METs 2.1      Track   Laps 20    Minutes 15    METs 2.1      Prescription Details   Duration Progress to 30 minutes of continuous aerobic without signs/symptoms of physical distress      Intensity   THRR 40-80% of Max Heartrate 106-131    Ratings of Perceived Exertion 11-13    Perceived Dyspnea 0-4      Progression   Progression Continue to progress workloads to maintain intensity without signs/symptoms of physical distress.      Resistance Training   Training Prescription Yes    Weight 2 lb    Reps 10-15             Perform Capillary Blood Glucose checks as needed.  Exercise Prescription Changes:   Exercise Prescription Changes     Row Name 10/03/23 1600 10/18/23 1500 10/30/23 1400         Response to Exercise   Blood Pressure (Admit) 122/74 120/80 118/62     Blood Pressure (Exercise) 132/72 156/80 138/72     Blood Pressure (Exit) 130/72 132/74  136/76     Heart Rate (Admit) 82 bpm 86 bpm 85 bpm     Heart Rate  (Exercise) 106 bpm 106 bpm 106 bpm     Heart Rate (Exit) 84 bpm 100 bpm 93 bpm     Oxygen Saturation (Admit) 94 % 96 % 94 %     Oxygen Saturation (Exercise) 91 % 94 % 93 %     Oxygen Saturation (Exit) 93 % 96 % 98 %     Rating of Perceived Exertion (Exercise) 13 12 13      Perceived Dyspnea (Exercise) 1 1 2      Symptoms coughing none none     Comments results -- --     Duration -- Progress to 30 minutes of  aerobic without signs/symptoms of physical distress Progress to 30 minutes of  aerobic without signs/symptoms of physical distress     Intensity -- THRR unchanged THRR unchanged       Progression   Progression -- Continue to progress workloads to maintain intensity without signs/symptoms of physical distress. Continue to progress workloads to maintain intensity without signs/symptoms of physical distress.     Average METs 2.1 2.45 2.2       Resistance Training   Training Prescription -- Yes Yes     Weight -- 2 2     Reps -- 10-15 10-15       Interval Training   Interval Training -- No No       Recumbant Bike   Level -- 1 1     Watts -- 25 25     Minutes -- 15 15     METs -- 3.1 3.05       REL-XR   Level -- -- 1     Minutes -- -- 15     METs -- -- 2       T5 Nustep   Level -- 1 1     Minutes -- 15 15     METs -- 1.8 1.8       Track   Laps -- -- 18     Minutes -- -- 15     METs -- -- 1.98       Oxygen   Maintain Oxygen Saturation -- 88% or higher 88% or higher              Exercise Comments:   Exercise Comments     Row Name 10/11/23 0933           Exercise Comments First full day of exercise!  Patient was oriented to gym and equipment including functions, settings, policies, and procedures.  Patient's individual exercise prescription and treatment plan were reviewed.  All starting workloads were established based on the results of the 6 minute walk test done at initial orientation visit.  The plan for exercise progression was also introduced and  progression will be customized based on patient's performance and goals.                Exercise Goals and Review:   Exercise Goals     Row Name 10/03/23 1607             Exercise Goals   Increase Physical Activity Yes       Intervention Develop an individualized exercise prescription for aerobic and resistive training based on initial evaluation findings, risk stratification, comorbidities and participant's personal goals.;Provide advice, education, support and counseling about physical activity/exercise needs.       Expected  Outcomes Long Term: Exercising regularly at least 3-5 days a week.;Short Term: Attend rehab on a regular basis to increase amount of physical activity.;Long Term: Add in home exercise to make exercise part of routine and to increase amount of physical activity.       Increase Strength and Stamina Yes       Intervention Develop an individualized exercise prescription for aerobic and resistive training based on initial evaluation findings, risk stratification, comorbidities and participant's personal goals.;Provide advice, education, support and counseling about physical activity/exercise needs.       Expected Outcomes Long Term: Improve cardiorespiratory fitness, muscular endurance and strength as measured by increased METs and functional capacity ( );Short Term: Perform resistance training exercises routinely during rehab and add in resistance training at home;Short Term: Increase workloads from initial exercise prescription for resistance, speed, and METs.       Able to understand and use rate of perceived exertion (RPE) scale Yes       Intervention Provide education and explanation on how to use RPE scale       Expected Outcomes Long Term:  Able to use RPE to guide intensity level when exercising independently;Short Term: Able to use RPE daily in rehab to express subjective intensity level       Able to understand and use Dyspnea scale Yes       Intervention  Provide education and explanation on how to use Dyspnea scale       Expected Outcomes Long Term: Able to use Dyspnea scale to guide intensity level when exercising independently;Short Term: Able to use Dyspnea scale daily in rehab to express subjective sense of shortness of breath during exertion       Knowledge and understanding of Target Heart Rate Range (THRR) Yes       Intervention Provide education and explanation of THRR including how the numbers were predicted and where they are located for reference       Expected Outcomes Long Term: Able to use THRR to govern intensity when exercising independently;Short Term: Able to use daily as guideline for intensity in rehab;Short Term: Able to state/look up THRR       Able to check pulse independently Yes       Intervention Review the importance of being able to check your own pulse for safety during independent exercise;Provide education and demonstration on how to check pulse in carotid and radial arteries.       Expected Outcomes Long Term: Able to check pulse independently and accurately;Short Term: Able to explain why pulse checking is important during independent exercise       Understanding of Exercise Prescription Yes       Intervention Provide education, explanation, and written materials on patient's individual exercise prescription       Expected Outcomes Long Term: Able to explain home exercise prescription to exercise independently;Short Term: Able to explain program exercise prescription                Exercise Goals Re-Evaluation :  Exercise Goals Re-Evaluation     Row Name 10/11/23 0933 10/18/23 1527 10/30/23 1444         Exercise Goal Re-Evaluation   Exercise Goals Review Able to understand and use rate of perceived exertion (RPE) scale;Able to understand and use Dyspnea scale;Knowledge and understanding of Target Heart Rate Range (THRR);Understanding of Exercise Prescription Increase Physical Activity;Increase Strength and  Stamina;Understanding of Exercise Prescription Increase Physical Activity;Increase Strength and Stamina;Understanding of Exercise Prescription  Comments Reviewed RPE and dyspnea scale, THR and program prescription with pt today.  Pt voiced understanding and was given a copy of goals to take home. Keydi is off to a great start in the program. She has only attended one session during the time of this review. She has been able to use the T5 nustep and recumbent bike at level 1. We will continue to monitor her progress in the program. Alphia is off to a great start in the program. She was only able to attend during at the time of this review. During these sessions she was able to use all of her prescribed exercise machines at her prescribed intensities. We will continue to monitor her progress in the program.     Expected Outcomes Short: Use RPE daily to regulate intensity. Long: Follow program prescription in THR. Short: Continue to follow current exercise prescription. Long: Continue exercise to improve strength and stamina. Short: Continue to follow current exercise prescription. Long: Continue exercise to improve strength and stamina.              Discharge Exercise Prescription (Final Exercise Prescription Changes):  Exercise Prescription Changes - 10/30/23 1400       Response to Exercise   Blood Pressure (Admit) 118/62    Blood Pressure (Exercise) 138/72    Blood Pressure (Exit) 136/76    Heart Rate (Admit) 85 bpm    Heart Rate (Exercise) 106 bpm    Heart Rate (Exit) 93 bpm    Oxygen Saturation (Admit) 94 %    Oxygen Saturation (Exercise) 93 %    Oxygen Saturation (Exit) 98 %    Rating of Perceived Exertion (Exercise) 13    Perceived Dyspnea (Exercise) 2    Symptoms none    Duration Progress to 30 minutes of  aerobic without signs/symptoms of physical distress    Intensity THRR unchanged      Progression   Progression Continue to progress workloads to maintain intensity without  signs/symptoms of physical distress.    Average METs 2.2      Resistance Training   Training Prescription Yes    Weight 2    Reps 10-15      Interval Training   Interval Training No      Recumbant Bike   Level 1    Watts 25    Minutes 15    METs 3.05      REL-XR   Level 1    Minutes 15    METs 2      T5 Nustep   Level 1    Minutes 15    METs 1.8      Track   Laps 18    Minutes 15    METs 1.98      Oxygen   Maintain Oxygen Saturation 88% or higher             Nutrition:  Target Goals: Understanding of nutrition guidelines, daily intake of sodium 1500mg , cholesterol 200mg , calories 30% from fat and 7% or less from saturated fats, daily to have 5 or more servings of fruits and vegetables.  Education: All About Nutrition: -Group instruction provided by verbal, written material, interactive activities, discussions, models, and posters to present general guidelines for heart healthy nutrition including fat, fiber, MyPlate, the role of sodium in heart healthy nutrition, utilization of the nutrition label, and utilization of this knowledge for meal planning. Follow up email sent as well. Written material given at graduation.   Biometrics:  Pre  Biometrics - 10/03/23 1609       Pre Biometrics   Height 5' 3.4" (1.61 m)    Weight 160 lb 9.6 oz (72.8 kg)    Waist Circumference 39.4 inches    Hip Circumference 41.5 inches    Waist to Hip Ratio 0.95 %    BMI (Calculated) 28.1    Single Leg Stand 2.88 seconds              Nutrition Therapy Plan and Nutrition Goals:  Nutrition Therapy & Goals - 11/06/23 0939       Nutrition Therapy   RD appointment deferred Yes             Nutrition Assessments:  MEDIFICTS Score Key: >=70 Need to make dietary changes  40-70 Heart Healthy Diet <= 40 Therapeutic Level Cholesterol Diet  Flowsheet Row Pulmonary Rehab from 10/03/2023 in [facility] Cardiac and Pulmonary Rehab  Picture Your Plate Total Score on Admission  60      Picture Your Plate Scores: <62 Unhealthy dietary pattern with much room for improvement. 41-50 Dietary pattern unlikely to meet recommendations for good health and room for improvement. 51-60 More healthful dietary pattern, with some room for improvement.  >60 Healthy dietary pattern, although there may be some specific behaviors that could be improved.   Nutrition Goals Re-Evaluation:  Nutrition Goals Re-Evaluation     Row Name 11/06/23 [phone]             Goals   Comment Pt has deferred to meet with the RD at this time.                Nutrition Goals Discharge (Final Nutrition Goals Re-Evaluation):  Nutrition Goals Re-Evaluation - 11/06/23 0939       Goals   Comment Pt has deferred to meet with the RD at this time.             Psychosocial: Target Goals: Acknowledge presence or absence of significant depression and/or stress, maximize coping skills, provide positive support system. Participant is able to verbalize types and ability to use techniques and skills needed for reducing stress and depression.   Education: Stress, Anxiety, and Depression - Group verbal and visual presentation to define topics covered.  Reviews how body is impacted by stress, anxiety, and depression.  Also discusses healthy ways to reduce stress and to treat/manage anxiety and depression.  Written material given at graduation.   Education: Sleep Hygiene -Provides group verbal and written instruction about how sleep can affect your health.  Define sleep hygiene, discuss sleep cycles and impact of sleep habits. Review good sleep hygiene tips.    Initial Review & Psychosocial Screening:  Initial Psych Review & Screening - 10/01/23 1415       Initial Review   Current issues with None Identified      Family Dynamics   Good Support System? Yes   husband, grandson and grand-daughter     Screening Interventions   Interventions Encouraged to exercise    Expected Outcomes Short  Term goal: Utilizing psychosocial counselor, staff and physician to assist with identification of specific Stressors or current issues interfering with healing process. Setting desired goal for each stressor or current issue identified.;Long Term Goal: Stressors or current issues are controlled or eliminated.;Short Term goal: Identification and review with participant of any Quality of Life or Depression concerns found by scoring the questionnaire.;Long Term goal: The participant improves quality of Life and PHQ9 Scores as seen by post scores and/or verbalization  of changes             Quality of Life Scores:  Scores of 19 and below usually indicate a poorer quality of life in these areas.  A difference of  2-3 points is a clinically meaningful difference.  A difference of 2-3 points in the total score of the Quality of Life Index has been associated with significant improvement in overall quality of life, self-image, physical symptoms, and general health in studies assessing change in quality of life.  PHQ-9: Review Flowsheet  More data exists      10/03/2023 03/30/2023 03/20/2023 01/02/2023 06/13/2022  Depression screen PHQ 2/9  Decreased Interest 0 0 0 0 0  Down, Depressed, Hopeless 0 0 0 0 0  PHQ - 2 Score 0 0 0 0 0  Altered sleeping 0 0 0 0 0  Tired, decreased energy 2 0 0 0 0  Change in appetite 0 0 0 0 0  Feeling bad or failure about yourself  0 0 0 0 0  Trouble concentrating 0 0 0 0 0  Moving slowly or fidgety/restless 0 0 0 0 0  Suicidal thoughts 0 0 0 0 0  PHQ-9 Score 2 0 0 0 0  Difficult doing work/chores Somewhat difficult Not difficult at all Not difficult at all Not difficult at all Not difficult at all    Details           Interpretation of Total Score  Total Score Depression Severity:  1-4 = Minimal depression, 5-9 = Mild depression, 10-14 = Moderate depression, 15-19 = Moderately severe depression, 20-27 = Severe depression   Psychosocial Evaluation and  Intervention:  Psychosocial Evaluation - 10/01/23 1430       Psychosocial Evaluation & Interventions   Interventions Encouraged to exercise with the program and follow exercise prescription    Comments Micca is coming to pulmonary rehab for COPD. She has no barriers to attending the program. She reports no concerns with stress, depression, or anxiety. She has a good support system at home that includes her husband and also her grand-daughter and grandson that live with her. Ragina would like to be able to do more activities without getting short of breath and hopes to reach this goal with rehab.    Expected Outcomes Short:  Attend pulmonary rehab for education and exercise  Long:  Develop and maintain positive self care habits    Continue Psychosocial Services  Follow up required by staff             Psychosocial Re-Evaluation:  Psychosocial Re-Evaluation     Row Name 11/06/23 0922             Psychosocial Re-Evaluation   Current issues with None Identified       Comments Grete says that she continues to have a good support system and has no mental health concerns at this moment. She also states that she is getting good sleep.       Expected Outcomes Short: Continue to manage stress levels. Long: Continue to use her support system and continue exercise to manage stressors.       Interventions Encouraged to attend Pulmonary Rehabilitation for the exercise       Continue Psychosocial Services  Follow up required by staff                Psychosocial Discharge (Final Psychosocial Re-Evaluation):  Psychosocial Re-Evaluation - 11/06/23 1610       Psychosocial Re-Evaluation   Current issues  with None Identified    Comments Nyemah says that she continues to have a good support system and has no mental health concerns at this moment. She also states that she is getting good sleep.    Expected Outcomes Short: Continue to manage stress levels. Long: Continue to use her support  system and continue exercise to manage stressors.    Interventions Encouraged to attend Pulmonary Rehabilitation for the exercise    Continue Psychosocial Services  Follow up required by staff             Education: Education Goals: Education classes will be provided on a weekly basis, covering required topics. Participant will state understanding/return demonstration of topics presented.  Learning Barriers/Preferences:   General Pulmonary Education Topics:  Infection Prevention: - Provides verbal and written material to individual with discussion of infection control including proper hand washing and proper equipment cleaning during exercise session. Flowsheet Row Pulmonary Rehab from 10/18/2023 in [facility] Cardiac and Pulmonary Rehab  Date 10/03/23  Educator [facility]  Instruction Review Code 1- Verbalizes Understanding       Falls Prevention: - Provides verbal and written material to individual with discussion of falls prevention and safety. Flowsheet Row Pulmonary Rehab from 10/18/2023 in [facility] Cardiac and Pulmonary Rehab  Date 10/03/23  Educator [facility]  Instruction Review Code 1- Verbalizes Understanding       Chronic Lung Disease Review: - Group verbal instruction with posters, models, PowerPoint presentations and videos,  to review new updates, new respiratory medications, new advancements in procedures and treatments. Providing information on websites and "800" numbers for continued self-education. Includes information about supplement oxygen, available portable oxygen systems, continuous and intermittent flow rates, oxygen safety, concentrators, and Medicare reimbursement for oxygen. Explanation of Pulmonary Drugs, including class, frequency, complications, importance of spacers, rinsing mouth after steroid MDI's, and proper cleaning methods for nebulizers. Review of basic lung anatomy and physiology related to function, structure, and complications of lung disease. Review of risk  factors. Discussion about methods for diagnosing sleep apnea and types of masks and machines for OSA. Includes a review of the use of types of environmental controls: home humidity, furnaces, filters, dust mite/pet prevention, HEPA vacuums. Discussion about weather changes, air quality and the benefits of nasal washing. Instruction on Warning signs, infection symptoms, calling MD promptly, preventive modes, and value of vaccinations. Review of effective airway clearance, coughing and/or vibration techniques. Emphasizing that all should Create an Action Plan. Written material given at graduation.   AED/CPR: - Group verbal and written instruction with the use of models to demonstrate the basic use of the AED with the basic ABC's of resuscitation.    Anatomy and Cardiac Procedures: - Group verbal and visual presentation and models provide information about basic cardiac anatomy and function. Reviews the testing methods done to diagnose heart disease and the outcomes of the test results. Describes the treatment choices: Medical Management, Angioplasty, or Coronary Bypass Surgery for treating various heart conditions including Myocardial Infarction, Angina, Valve Disease, and Cardiac Arrhythmias.  Written material given at graduation.   Medication Safety: - Group verbal and visual instruction to review commonly prescribed medications for heart and lung disease. Reviews the medication, class of the drug, and side effects. Includes the steps to properly store meds and maintain the prescription regimen.  Written material given at graduation.   Other: -Provides group and verbal instruction on various topics (see comments)   Knowledge Questionnaire Score:  Knowledge Questionnaire Score - 10/11/23 1040  Knowledge Questionnaire Score   Pre Score 10/18              Core Components/Risk Factors/Patient Goals at Admission:  Personal Goals and Risk Factors at Admission - 10/01/23 1419        Core Components/Risk Factors/Patient Goals on Admission    Weight Management Yes    Intervention Weight Management: Develop a combined nutrition and exercise program designed to reach desired caloric intake, while maintaining appropriate intake of nutrient and fiber, sodium and fats, and appropriate energy expenditure required for the weight goal.;Weight Management: Provide education and appropriate resources to help participant work on and attain dietary goals.;Weight Management/Obesity: Establish reasonable short term and long term weight goals.    Admit Weight 157 lb (71.2 kg)    Goal Weight: Short Term 152 lb (68.9 kg)    Goal Weight: Long Term 147 lb (66.7 kg)    Expected Outcomes Short Term: Continue to assess and modify interventions until short term weight is achieved;Long Term: Adherence to nutrition and physical activity/exercise program aimed toward attainment of established weight goal;Weight Maintenance: Understanding of the daily nutrition guidelines, which includes 25-35% calories from fat, 7% or less cal from saturated fats, less than 200mg  cholesterol, less than 1.5gm of sodium, & 5 or more servings of fruits and vegetables daily;Weight Loss: Understanding of general recommendations for a balanced deficit meal plan, which promotes 1-2 lb weight loss per week and includes a negative energy balance of [phone] kcal/d;Understanding recommendations for meals to include 15-35% energy as protein, 25-35% energy from fat, 35-60% energy from carbohydrates, less than 200mg  of dietary cholesterol, 20-35 gm of total fiber daily;Understanding of distribution of calorie intake throughout the day with the consumption of 4-5 meals/snacks    Improve shortness of breath with ADL's Yes    Intervention Provide education, individualized exercise plan and daily activity instruction to help decrease symptoms of SOB with activities of daily living.    Expected Outcomes Short Term: Improve cardiorespiratory fitness  to achieve a reduction of symptoms when performing ADLs;Long Term: Be able to perform more ADLs without symptoms or delay the onset of symptoms    Increase knowledge of respiratory medications and ability to use respiratory devices properly  Yes    Intervention Provide education and demonstration as needed of appropriate use of medications, inhalers, and oxygen therapy.    Expected Outcomes Short Term: Achieves understanding of medications use. Understands that oxygen is a medication prescribed by physician. Demonstrates appropriate use of inhaler and oxygen therapy.;Long Term: Maintain appropriate use of medications, inhalers, and oxygen therapy.    Hypertension Yes    Intervention Monitor prescription use compliance.;Provide education on lifestyle modifcations including regular physical activity/exercise, weight management, moderate sodium restriction and increased consumption of fresh fruit, vegetables, and low fat dairy, alcohol moderation, and smoking cessation.    Expected Outcomes Short Term: Continued assessment and intervention until BP is < 140/78mm HG in hypertensive participants. < 130/13mm HG in hypertensive participants with diabetes, heart failure or chronic kidney disease.;Long Term: Maintenance of blood pressure at goal levels.             Education:Diabetes - Individual verbal and written instruction to review signs/symptoms of diabetes, desired ranges of glucose level fasting, after meals and with exercise. Acknowledge that pre and post exercise glucose checks will be done for 3 sessions at entry of program.   Know Your Numbers and Heart Failure: - Group verbal and visual instruction to discuss disease risk factors for cardiac and pulmonary  disease and treatment options.  Reviews associated critical values for Overweight/Obesity, Hypertension, Cholesterol, and Diabetes.  Discusses basics of heart failure: signs/symptoms and treatments.  Introduces Heart Failure Zone chart for  action plan for heart failure.  Written material given at graduation.   Core Components/Risk Factors/Patient Goals Review:   Goals and Risk Factor Review     Row Name 11/06/23 0927             Core Components/Risk Factors/Patient Goals Review   Personal Goals Review Weight Management/Obesity;Hypertension       Review Bryttani continues to take her blood pressure at home at least once a day. Kenidy states that she wants to lose around 30 lbs, and continues to manage her diet in order to hit that goal.       Expected Outcomes Shot: Continue to take blood pressure at home to manage hypertension. Long: Continue exercising to manage weight loss goals.                Core Components/Risk Factors/Patient Goals at Discharge (Final Review):   Goals and Risk Factor Review - 11/06/23 0927       Core Components/Risk Factors/Patient Goals Review   Personal Goals Review Weight Management/Obesity;Hypertension    Review Zarea continues to take her blood pressure at home at least once a day. Makenly states that she wants to lose around 30 lbs, and continues to manage her diet in order to hit that goal.    Expected Outcomes Shot: Continue to take blood pressure at home to manage hypertension. Long: Continue exercising to manage weight loss goals.             ITP Comments:  ITP Comments     Row Name 10/01/23 1427 10/03/23 1559 10/11/23 0933 10/17/23 1239 11/07/23 1135   ITP Comments Initial phone call completed. Dx can be found in [facility] 9/9. EP orientation scheduled for 10/16 at 2:30 PM. Completed and gym orientation. Initial ITP created and sent for review to Dr. Jinny Sanders, Medical Director. First full day of exercise!  Patient was oriented to gym and equipment including functions, settings, policies, and procedures.  Patient's individual exercise prescription and treatment plan were reviewed.  All starting workloads were established based on the results of the 6 minute walk test  done at initial orientation visit.  The plan for exercise progression was also introduced and progression will be customized based on patient's performance and goals. 30 Day review completed. Medical Director ITP review done, changes made as directed, and signed approval by Medical Director.    new to program 30 Day review completed. Medical Director ITP review done, changes made as directed, and signed approval by Medical Director.            Comments:

## 2023-11-08 ENCOUNTER — Encounter: Payer: Medicare Other | Admitting: *Deleted

## 2023-11-08 LAB — CBC WITH DIFFERENTIAL/PLATELET
Basophils Absolute: 0.1 10*3/uL (ref 0.0–0.2)
Basos: 1 %
EOS (ABSOLUTE): 0.4 10*3/uL (ref 0.0–0.4)
Eos: 6 %
Hematocrit: 39.9 % (ref 34.0–46.6)
Hemoglobin: 12.4 g/dL (ref 11.1–15.9)
Immature Grans (Abs): 0 10*3/uL (ref 0.0–0.1)
Immature Granulocytes: 0 %
Lymphocytes Absolute: 1.3 10*3/uL (ref 0.7–3.1)
Lymphs: 21 %
MCH: 25.8 pg — ABNORMAL LOW (ref 26.6–33.0)
MCHC: 31.1 g/dL — ABNORMAL LOW (ref 31.5–35.7)
MCV: 83 fL (ref 79–97)
Monocytes Absolute: 0.5 10*3/uL (ref 0.1–0.9)
Monocytes: 9 %
Neutrophils Absolute: 3.9 10*3/uL (ref 1.4–7.0)
Neutrophils: 63 %
Platelets: 362 10*3/uL (ref 150–450)
RBC: 4.8 x10E6/uL (ref 3.77–5.28)
RDW: 14.2 % (ref 11.7–15.4)
WBC: 6.2 10*3/uL (ref 3.4–10.8)

## 2023-11-08 LAB — COMPREHENSIVE METABOLIC PANEL
ALT: 20 [IU]/L (ref 0–32)
AST: 28 [IU]/L (ref 0–40)
Albumin: 4 g/dL (ref 3.8–4.8)
Alkaline Phosphatase: 118 [IU]/L (ref 44–121)
BUN/Creatinine Ratio: 10 — ABNORMAL LOW (ref 12–28)
BUN: 10 mg/dL (ref 8–27)
Bilirubin Total: 0.3 mg/dL (ref 0.0–1.2)
CO2: 24 mmol/L (ref 20–29)
Calcium: 9.6 mg/dL (ref 8.7–10.3)
Chloride: 106 mmol/L (ref 96–106)
Creatinine, Ser: 0.97 mg/dL (ref 0.57–1.00)
Globulin, Total: 2 g/dL (ref 1.5–4.5)
Glucose: 95 mg/dL (ref 70–99)
Potassium: 4.2 mmol/L (ref 3.5–5.2)
Sodium: 142 mmol/L (ref 134–144)
Total Protein: 6 g/dL (ref 6.0–8.5)
eGFR: 61 mL/min/{1.73_m2} (ref >59)

## 2023-11-08 LAB — LIPID PANEL W/O CHOL/HDL RATIO
Cholesterol, Total: 169 mg/dL (ref 100–199)
HDL: 62 mg/dL (ref >39)
LDL Chol Calc (NIH): 88 mg/dL (ref 0–99)
Triglycerides: 107 mg/dL (ref 0–149)
VLDL Cholesterol Cal: 19 mg/dL (ref 5–40)

## 2023-11-08 LAB — VITAMIN D 25 HYDROXY (VIT D DEFICIENCY, FRACTURES): Vit D, 25-Hydroxy: 38.8 ng/mL (ref 30.0–100.0)

## 2023-11-09 ENCOUNTER — Ambulatory Visit: Payer: Self-pay

## 2023-11-09 NOTE — Telephone Encounter (Signed)
The nausea can last for a few days when she first starts it- she can take it every other day for a week to see if that stops the symptoms, but either way I'd expect her to start feeling better by Monday

## 2023-11-09 NOTE — Telephone Encounter (Signed)
Called and notified patient of Dr. Henriette Combs message.

## 2023-11-09 NOTE — Telephone Encounter (Signed)
  Chief Complaint: medication problem  Symptoms: dizziness and nausea  Frequency: 3 days  Pertinent Negatives: NA Disposition: [] ED /[] Urgent Care (no appt availability in office) / [] Appointment(In office/virtual)/ []  [facility]/ [] Home Care/ [] Refused Recommended Disposition /[]  Mobile Bus/ [x]  Follow-up with PCP Additional Notes: pt had OV on 11/07/23 was prescribed venlafaxine XR (EFFEXOR XR) 37.5 MG, pt states she has taken 3 doses and feels nauseous and dizzy. Takes in the AM with breakfast. Pt is wanting to know should she continue taking or another prescription needed. Advised since this was a new medication, I would send to provider for review. Pt would like call back when possible.   Summary: Dizzy Advice   Pt is calling to report that she is dizzy & light headed after being prescribed  venlafaxine XR (EFFEXOR XR) 37.5 MG 24 hr capsule [[id number]]. Should she continue to take the medication?       Reason for Disposition  [1] Caller has URGENT medicine question about med that PCP or specialist prescribed AND [2] triager unable to answer question  Answer Assessment - Initial Assessment Questions 1. NAME of MEDICINE: "What medicine(s) are you calling about?"     Venlafaxine  2. QUESTION: "What is your question?" (e.g., double dose of medicine, side effect)     Wanting to know if continue taking it  3. PRESCRIBER: "Who prescribed the medicine?" Reason: if prescribed by specialist, call should be referred to that group.     Dr. Laural Benes  4. SYMPTOMS: "Do you have any symptoms?" If Yes, ask: "What symptoms are you having?"  "How bad are the symptoms (e.g., mild, moderate, severe)     Dizziness and nausea  Protocols used: Medication Question Call-A-AH

## 2023-11-09 NOTE — Telephone Encounter (Signed)
Routing to provider to advise.  

## 2023-11-12 ENCOUNTER — Encounter: Payer: Self-pay | Admitting: Family Medicine

## 2023-11-12 DIAGNOSIS — H1033 Unspecified acute conjunctivitis, bilateral: Secondary | ICD-10-CM | POA: Diagnosis not present

## 2023-11-12 NOTE — Assessment & Plan Note (Signed)
Under good control on current regimen. Continue current regimen. Continue to monitor. Call with any concerns. Refills given. Labs drawn today.

## 2023-11-12 NOTE — Assessment & Plan Note (Signed)
Stopped her estradiol because she was afraid of the side effects. She has not been doing well. Has never been on effexor- will start low dose and recheck in about 6 weeks. Call with any concerns.

## 2023-11-12 NOTE — Assessment & Plan Note (Signed)
Will keep her BP and cholesterol under good control. Continue to monitor. Call with any concerns.

## 2023-11-12 NOTE — Assessment & Plan Note (Signed)
Rechecking labs today. Await results.

## 2023-11-12 NOTE — Assessment & Plan Note (Signed)
Under good control on current regimen. Continue current regimen. Continue to monitor. Call with any concerns. Refills given. Labs drawn today.   

## 2023-11-12 NOTE — Assessment & Plan Note (Signed)
Rechecking labs today. Await results. Treat as needed.  °

## 2023-11-13 ENCOUNTER — Encounter: Payer: Medicare Other | Admitting: *Deleted

## 2023-11-13 DIAGNOSIS — J449 Chronic obstructive pulmonary disease, unspecified: Secondary | ICD-10-CM

## 2023-11-13 NOTE — Progress Notes (Signed)
Daily Session Note  Patient Details  Name: Nancy Mack MRN: [id number] Date of Birth: 10-22-1946 Referring Provider:   Flowsheet Row Pulmonary Rehab from 10/03/2023 in [facility] Cardiac and Pulmonary Rehab  Referring Provider Dr. Vida Rigger       Encounter Date: 11/13/2023  Check In:  Session Check In - 11/13/23 0947       Check-In   Supervising physician immediately available to respond to emergencies See telemetry face sheet for immediately available ER MD    Location [facility]-Cardiac & Pulmonary Rehab    Staff Present Cora Collum, RN, BSN, CCRP;Noah Tickle, BS, Exercise Physiologist;Margaret Best, MS, Exercise Physiologist;Maxon Conetta BS, , Exercise Physiologist    Virtual Visit No    Medication changes reported     No    Fall or balance concerns reported    No    Warm-up and Cool-down Performed on first and last piece of equipment    Resistance Training Performed Yes    VAD Patient? No    PAD/SET Patient? No      Pain Assessment   Currently in Pain? No/denies                Social History   Tobacco Use  Smoking Status Former   Current packs/day: 0.00   Average packs/day: 2.0 packs/day for 2.0 years (4.0 ttl pk-yrs)   Types: Cigarettes   Start date: 12/18/1976   Quit date: 12/18/1978   Years since quitting: 44.9  Smokeless Tobacco Never  Tobacco Comments   quit in 1980    Goals Met:  Proper associated with RPD/PD & O2 Sat Independence with exercise equipment Exercise tolerated well No report of concerns or symptoms today  Goals Unmet:  Not Applicable  Comments: Pt able to follow exercise prescription today without complaint.  Will continue to monitor for progression.    Dr. Bethann Punches is Medical Director for [facility] Cardiac Rehabilitation.  Dr. Vida Rigger is Medical Director for [facility] Pulmonary Rehabilitation.

## 2023-11-20 ENCOUNTER — Encounter: Payer: Medicare Other | Attending: Pulmonary Disease | Admitting: *Deleted

## 2023-11-20 DIAGNOSIS — M25531 Pain in right wrist: Secondary | ICD-10-CM | POA: Diagnosis not present

## 2023-11-20 DIAGNOSIS — J449 Chronic obstructive pulmonary disease, unspecified: Secondary | ICD-10-CM | POA: Insufficient documentation

## 2023-11-20 DIAGNOSIS — M1811 Unilateral primary osteoarthritis of first carpometacarpal joint, right hand: Secondary | ICD-10-CM | POA: Diagnosis not present

## 2023-11-20 NOTE — Progress Notes (Signed)
Daily Session Note  Patient Details  Name: Nancy Mack MRN: [id number] Date of Birth: 28-Jul-1946 Referring Provider:   Flowsheet Row Pulmonary Rehab from 10/03/2023 in [facility] Cardiac and Pulmonary Rehab  Referring Provider Dr. Vida Rigger       Encounter Date: 11/20/2023  Check In:  Session Check In - 11/20/23 0935       Check-In   Supervising physician immediately available to respond to emergencies See telemetry face sheet for immediately available ER MD    Location [facility]-Cardiac & Pulmonary Rehab    Staff Present Cora Collum, RN, BSN, CCRP;Margaret Best, MS, Exercise Physiologist;Maxon Conetta BS, , Exercise Physiologist;Noah Tickle, BS, Exercise Physiologist    Virtual Visit No    Medication changes reported     No    Fall or balance concerns reported    No    Warm-up and Cool-down Performed on first and last piece of equipment    Resistance Training Performed Yes    VAD Patient? No    PAD/SET Patient? No      Pain Assessment   Currently in Pain? No/denies                Social History   Tobacco Use  Smoking Status Former   Current packs/day: 0.00   Average packs/day: 2.0 packs/day for 2.0 years (4.0 ttl pk-yrs)   Types: Cigarettes   Start date: 12/18/1976   Quit date: 12/18/1978   Years since quitting: 44.9  Smokeless Tobacco Never  Tobacco Comments   quit in 1980    Goals Met:  Proper associated with RPD/PD & O2 Sat Independence with exercise equipment Exercise tolerated well No report of concerns or symptoms today  Goals Unmet:  Not Applicable  Comments: Pt able to follow exercise prescription today without complaint.  Will continue to monitor for progression.    Dr. Bethann Punches is Medical Director for [facility] Cardiac Rehabilitation.  Dr. Vida Rigger is Medical Director for [facility] Pulmonary Rehabilitation.

## 2023-11-21 DIAGNOSIS — H40009 Preglaucoma, unspecified, unspecified eye: Secondary | ICD-10-CM | POA: Diagnosis not present

## 2023-11-21 DIAGNOSIS — H1033 Unspecified acute conjunctivitis, bilateral: Secondary | ICD-10-CM | POA: Diagnosis not present

## 2023-11-22 ENCOUNTER — Encounter: Payer: Medicare Other | Admitting: *Deleted

## 2023-11-22 DIAGNOSIS — J449 Chronic obstructive pulmonary disease, unspecified: Secondary | ICD-10-CM

## 2023-11-22 NOTE — Progress Notes (Signed)
Daily Session Note  Patient Details  Name: Nancy Mack MRN: [id number] Date of Birth: [birth year] Referring Provider:   Flowsheet Row Pulmonary Rehab from 10/03/2023 in [facility] Cardiac and Pulmonary Rehab  Referring Provider Dr. Vida Rigger       Encounter Date: 11/22/2023  Check In:  Session Check In - 11/22/23 1041       Check-In   Supervising physician immediately available to respond to emergencies See telemetry face sheet for immediately available ER MD    Location [facility]-Cardiac & Pulmonary Rehab    Staff Present Cora Collum, RN, BSN, CCRP;Noah Tickle, BS, Exercise Physiologist;Joseph [place], RCP,RRT,BSRT;Meredith Jewel Baize, RN BSN    Virtual Visit No    Medication changes reported     No    Fall or balance concerns reported    No    Warm-up and Cool-down Performed on first and last piece of equipment    Resistance Training Performed Yes    VAD Patient? No    PAD/SET Patient? No      Pain Assessment   Currently in Pain? No/denies                Social History   Tobacco Use  Smoking Status Former   Current packs/day: 0.00   Average packs/day: 2.0 packs/day for 2.0 years (4.0 ttl pk-yrs)   Types: Cigarettes   Start date: 12/18/1976   Quit date: 12/18/1978   Years since quitting: 44.9  Smokeless Tobacco Never  Tobacco Comments   quit in 1980    Goals Met:  Proper associated with RPD/PD & O2 Sat Independence with exercise equipment Exercise tolerated well No report of concerns or symptoms today  Goals Unmet:  Not Applicable  Comments: Pt able to follow exercise prescription today without complaint.  Will continue to monitor for progression.    Dr. Bethann Punches is Medical Director for [facility] Cardiac Rehabilitation.  Dr. Vida Rigger is Medical Director for [facility] Pulmonary Rehabilitation.

## 2023-11-27 ENCOUNTER — Encounter: Payer: Medicare Other | Admitting: *Deleted

## 2023-11-27 DIAGNOSIS — J449 Chronic obstructive pulmonary disease, unspecified: Secondary | ICD-10-CM

## 2023-11-27 NOTE — Progress Notes (Signed)
Daily Session Note  Patient Details  Name: Nancy Mack MRN: [id number] Date of Birth: [birth year] Referring Provider:   Flowsheet Row Pulmonary Rehab from 10/03/2023 in [facility] Cardiac and Pulmonary Rehab  Referring Provider Dr. Vida Rigger       Encounter Date: 11/27/2023  Check In:  Session Check In - 11/27/23 0958       Check-In   Supervising physician immediately available to respond to emergencies See telemetry face sheet for immediately available ER MD    Location [facility]-Cardiac & Pulmonary Rehab    Staff Present Cora Collum, RN, BSN, CCRP;Margaret Best, MS, Exercise Physiologist;Noah Tickle, BS, Exercise Physiologist;Maxon Conetta BS, , Exercise Physiologist    Virtual Visit No    Medication changes reported     No    Fall or balance concerns reported    No    Warm-up and Cool-down Performed on first and last piece of equipment    Resistance Training Performed Yes    VAD Patient? No    PAD/SET Patient? No      Pain Assessment   Currently in Pain? No/denies                Social History   Tobacco Use  Smoking Status Former   Current packs/day: 0.00   Average packs/day: 2.0 packs/day for 2.0 years (4.0 ttl pk-yrs)   Types: Cigarettes   Start date: 12/18/1976   Quit date: 12/18/1978   Years since quitting: 44.9  Smokeless Tobacco Never  Tobacco Comments   quit in 1980    Goals Met:  Proper associated with RPD/PD & O2 Sat Independence with exercise equipment Exercise tolerated well No report of concerns or symptoms today  Goals Unmet:  Not Applicable  Comments: Pt able to follow exercise prescription today without complaint.  Will continue to monitor for progression.    Dr. Bethann Punches is Medical Director for [facility] Cardiac Rehabilitation.  Dr. Vida Rigger is Medical Director for [facility] Pulmonary Rehabilitation.

## 2023-11-29 ENCOUNTER — Encounter: Payer: Medicare Other | Admitting: *Deleted

## 2023-12-04 ENCOUNTER — Other Ambulatory Visit: Payer: Self-pay | Admitting: Family Medicine

## 2023-12-04 NOTE — Telephone Encounter (Signed)
Requested Prescriptions  Pending Prescriptions Disp Refills   azelastine (ASTELIN) 0.1 % nasal spray [Pharmacy Med Name: AZELASTINE 0.1% (137 MCG) SPRY] 90 mL 0    Sig: Place 1 spray 2 (two) times daily into both nostrils. Use in each nostril as directed     Ear, Nose, and Throat: Nasal Preparations - Antiallergy Passed - 12/04/2023  5:37 PM      Passed - Valid encounter within last 12 months    Recent Outpatient Visits           3 weeks ago Aortic atherosclerosis [facility])   [facility] [place], Megan P, DO   8 months ago Menopause   [place] [facility] [place], [place], DO   9 months ago Menopause   [place] [facility] [place], Megan P, DO   11 months ago Mixed hyperlipidemia   [facility] [place], [place], DO   1 year ago Post-nasal drip   [place] [facility] [place], Oralia Rud, DO       Future Appointments             In 3 weeks [place], Oralia Rud, DO [place] [facility]

## 2023-12-05 ENCOUNTER — Encounter: Payer: Self-pay | Admitting: *Deleted

## 2023-12-05 DIAGNOSIS — J449 Chronic obstructive pulmonary disease, unspecified: Secondary | ICD-10-CM

## 2023-12-05 NOTE — Progress Notes (Signed)
Pulmonary Individual Treatment Plan  Patient Details  Name: Nancy Mack MRN: [id number] Date of Birth: 1946/02/08 Referring Provider:   Flowsheet Row Pulmonary Rehab from 10/03/2023 in [facility] Cardiac and Pulmonary Rehab  Referring Provider Dr. Vida Rigger       Initial Encounter Date:  Flowsheet Row Pulmonary Rehab from 10/03/2023 in [facility] Cardiac and Pulmonary Rehab  Date 10/03/23       Visit Diagnosis: Chronic obstructive pulmonary disease, unspecified COPD type (HCC)  Patient's Home Medications on Admission:  Current Outpatient Medications:    acetaminophen (TYLENOL) 500 MG tablet, Take 1,000 mg by mouth every 6 (six) hours as needed., Disp: , Rfl:    albuterol (VENTOLIN HFA) 108 (90 Base) MCG/ACT inhaler, Inhale 2 puffs into the lungs every 6 (six) hours as needed., Disp: 25.5 g, Rfl: 12   Ascorbic Acid (VITAMIN C) 100 MG tablet, Take 100 mg by mouth daily., Disp: , Rfl:    aspirin EC 81 MG tablet, Take 81 mg by mouth daily. Swallow whole., Disp: , Rfl:    atorvastatin (LIPITOR) 80 MG tablet, Take 1 tablet (80 mg total) by mouth daily., Disp: 90 tablet, Rfl: 1   azelastine (ASTELIN) 0.1 % nasal spray, Place 1 spray 2 (two) times daily into both nostrils. Use in each nostril as directed, Disp: 90 mL, Rfl: 0   Biotin 1000 MCG tablet, Take 1,000 mcg by mouth daily., Disp: , Rfl:    Cholecalciferol (VITAMIN D) 50 MCG (2000 UT) tablet, Take 2,000 Units by mouth daily., Disp: , Rfl:    fluticasone (FLONASE) 50 MCG/ACT nasal spray, Place 1 spray into both nostrils in the morning and at bedtime., Disp: 16 g, Rfl: 12   Fluticasone-Umeclidin-Vilant (TRELEGY ELLIPTA) 100-62.5-25 MCG/ACT AEPB, Inhale 1 puff into the lungs daily., Disp: 3 each, Rfl: 4   hydrochlorothiazide (HYDRODIURIL) 25 MG tablet, Take 1 tablet (25 mg total) by mouth daily., Disp: 90 tablet, Rfl: 1   latanoprost (XALATAN) 0.005 % ophthalmic solution, SMARTSIG:In Eye(s), Disp: , Rfl:    montelukast (SINGULAIR) 10  MG tablet, Take 1 tablet (10 mg total) by mouth at bedtime., Disp: 90 tablet, Rfl: 1   triamcinolone cream (KENALOG) 0.1 %, Apply 1 application topically 2 (two) times daily. (Patient taking differently: Apply 1 application  topically 2 (two) times daily as needed (irritation).), Disp: 30 g, Rfl: 0   venlafaxine XR (EFFEXOR XR) 37.5 MG 24 hr capsule, Take 1 capsule (37.5 mg total) by mouth daily with breakfast., Disp: 30 capsule, Rfl: 3  Past Medical History: Past Medical History:  Diagnosis Date   Allergy    Anemia    Arthritis    Asthma    Carpal tunnel syndrome    Chronic kidney disease    Dyspnea    Eczema    Globus pharyngeus    Hyperlipidemia    Hypertension    Laryngopharyngeal reflux    Menopause    Neuroma    right foot   Osteoporosis    Pneumonia    Raynaud's disease    Rhinitis due to pollen    SOB (shortness of breath)     Tobacco Use: Social History   Tobacco Use  Smoking Status Former   Current packs/day: 0.00   Average packs/day: 2.0 packs/day for 2.0 years (4.0 ttl pk-yrs)   Types: Cigarettes   Start date: 12/18/1976   Quit date: 12/18/1978   Years since quitting: 44.9  Smokeless Tobacco Never  Tobacco Comments   quit in 1980  Labs: Review Flowsheet  More data exists      Latest Ref Rng & Units 05/04/2021 12/13/2021 06/13/2022 01/02/2023 11/07/2023  Labs for ITP Cardiac and Pulmonary Rehab  Cholestrol 100 - 199 mg/dL 119  147  829  562  130   LDL (calc) 0 - 99 mg/dL 90  865  784  696  88   HDL-C >39 mg/dL 73  73  63  74  62   Trlycerides 0 - 149 mg/dL 295  95  284  132  440      Pulmonary Assessment Scores:  Pulmonary Assessment Scores     Row Name 10/03/23 1555         ADL UCSD   ADL Phase Entry     SOB Score total 34     Rest 1     Walk 3     Stairs 5     Bath 0     Dress 1     Shop 4       CAT Score   CAT Score 24       mMRC Score   mMRC Score 3              UCSD: Self-administered rating of dyspnea associated with  activities of daily living (ADLs) 6-point scale (0 = "not at all" to 5 = "maximal or unable to do because of breathlessness")  Scoring Scores range from 0 to 120.  Minimally important difference is 5 units  CAT: CAT can identify the health impairment of COPD patients and is better correlated with disease progression.  CAT has a scoring range of zero to 40. The CAT score is classified into four groups of low (less than 10), medium (10 - 20), high (21-30) and very high (31-40) based on the impact level of disease on health status. A CAT score over 10 suggests significant symptoms.  A worsening CAT score could be explained by an exacerbation, poor medication adherence, poor inhaler technique, or progression of COPD or comorbid conditions.  CAT MCID is 2 points  mMRC: mMRC (Modified Medical Research Council) Dyspnea Scale is used to assess the degree of baseline functional disability in patients of respiratory disease due to dyspnea. No minimal important difference is established. A decrease in score of 1 point or greater is considered a positive change.   Pulmonary Function Assessment:   Exercise Target Goals: Exercise Program Goal: Individual exercise prescription set using results from initial 6 min walk test and THRR while considering  patient's activity barriers and safety.   Exercise Prescription Goal: Initial exercise prescription builds to 30-45 minutes a day of aerobic activity, 2-3 days per week.  Home exercise guidelines will be given to patient during program as part of exercise prescription that the participant will acknowledge.  Education: Aerobic Exercise: - Group verbal and visual presentation on the components of exercise prescription. Introduces F.I.T.T principle from ACSM for exercise prescriptions.  Reviews F.I.T.T. principles of aerobic exercise including progression. Written material given at graduation. Flowsheet Row Pulmonary Rehab from 11/22/2023 in [facility] Cardiac and  Pulmonary Rehab  Date 10/18/23  Educator MB  Instruction Review Code 1- Verbalizes Understanding       Education: Resistance Exercise: - Group verbal and visual presentation on the components of exercise prescription. Introduces F.I.T.T principle from ACSM for exercise prescriptions  Reviews F.I.T.T. principles of resistance exercise including progression. Written material given at graduation. Flowsheet Row Pulmonary Rehab from 11/22/2023 in [facility] Cardiac and Pulmonary Rehab  Date  10/11/23  Educator MB  Instruction Review Code 1- [employer] Understanding        Education: Exercise & Equipment Safety: - Individual verbal instruction and demonstration of equipment use and safety with use of the equipment. Flowsheet Row Pulmonary Rehab from 11/22/2023 in [facility] Cardiac and Pulmonary Rehab  Date 10/03/23  Educator [facility]  Instruction Review Code 1- Verbalizes Understanding       Education: Exercise Physiology & General Exercise Guidelines: - Group verbal and written instruction with models to review the exercise physiology of the cardiovascular system and associated critical values. Provides general exercise guidelines with specific guidelines to those with heart or lung disease.    Education: Flexibility, Balance, Mind/Body Relaxation: - Group verbal and visual presentation with interactive activity on the components of exercise prescription. Introduces F.I.T.T principle from ACSM for exercise prescriptions. Reviews F.I.T.T. principles of flexibility and balance exercise training including progression. Also discusses the mind body connection.  Reviews various relaxation techniques to help reduce and manage stress (i.e. Deep breathing, progressive muscle relaxation, and visualization). Balance handout provided to take home. Written material given at graduation. Flowsheet Row Pulmonary Rehab from 11/22/2023 in [facility] Cardiac and Pulmonary Rehab  Date 10/11/23  Educator MB  Instruction Review Code  1- Verbalizes Understanding       Activity Barriers & Risk Stratification:  Activity Barriers & Cardiac Risk Stratification - 10/03/23 1603       Activity Barriers & Cardiac Risk Stratification   Activity Barriers Arthritis             6 Minute Walk:  6 Minute Walk     Row Name 10/03/23 1600         6 Minute Walk   Phase Initial     Distance 1060 feet     Walk Time 6 minutes     # of Rest Breaks 0     MPH 2     METS 2.1     RPE 13     Perceived Dyspnea  1     VO2 Peak 7.4     Symptoms Yes (comment)     Comments Coughing     Resting HR 82 bpm     Resting BP 122/74     Resting Oxygen Saturation  94 %     Exercise Oxygen Saturation  during 6 min walk 91 %     Max Ex. HR 106 bpm     Max Ex. BP 132/72     2 Minute Post BP 130/72       Interval HR   1 Minute HR 97     2 Minute HR 102     3 Minute HR 106     4 Minute HR 105     5 Minute HR 106     6 Minute HR 107     Interval Heart Rate? Yes       Interval Oxygen   Interval Oxygen? Yes     Baseline Oxygen Saturation % 94 %     1 Minute Oxygen Saturation % 92 %     1 Minute Liters of Oxygen 0 L     2 Minute Oxygen Saturation % 91 %     2 Minute Liters of Oxygen 0 L     3 Minute Oxygen Saturation % 93 %     3 Minute Liters of Oxygen 0 L     4 Minute Oxygen Saturation % 93 %     4 Minute Liters  of Oxygen 0 L     5 Minute Oxygen Saturation % 92 %     5 Minute Liters of Oxygen 0 L     6 Minute Oxygen Saturation % 91 %     6 Minute Liters of Oxygen 0 L     2 Minute Post Oxygen Saturation % 93 %     2 Minute Post Liters of Oxygen 0 L             Oxygen Initial Assessment:  Oxygen Initial Assessment - 10/03/23 1613       Home Oxygen   Home Oxygen Device None    Sleep Oxygen Prescription None    Home Exercise Oxygen Prescription None    Home Resting Oxygen Prescription None      Initial 6 min Walk   Oxygen Used None      Program Oxygen Prescription   Program Oxygen Prescription None              Oxygen Re-Evaluation:  Oxygen Re-Evaluation     Row Name 10/11/23 0934             Goals/Expected Outcomes   Comments Reviewed PLB technique with pt.  Talked about how it works and it's importance in maintaining their exercise saturations.       Goals/Expected Outcomes Short: Become more profiecient at using PLB. Long: Become independent at using PLB.                Oxygen Discharge (Final Oxygen Re-Evaluation):  Oxygen Re-Evaluation - 10/11/23 0934       Goals/Expected Outcomes   Comments Reviewed PLB technique with pt.  Talked about how it works and it's importance in maintaining their exercise saturations.    Goals/Expected Outcomes Short: Become more profiecient at using PLB. Long: Become independent at using PLB.             Initial Exercise Prescription:  Initial Exercise Prescription - 10/03/23 1600       Date of Initial Exercise RX and Referring Provider   Date 10/03/23    Referring Provider Dr. Vida Rigger      Oxygen   Maintain Oxygen Saturation 88% or higher      Recumbant Bike   Level 2    RPM 50    Watts 25    Minutes 15    METs 2.1      REL-XR   Level 1    Speed 50    Minutes 15    METs 2.1      T5 Nustep   Level 2    SPM 80    Minutes 15    METs 2.1      Track   Laps 20    Minutes 15    METs 2.1      Prescription Details   Duration Progress to 30 minutes of continuous aerobic without signs/symptoms of physical distress      Intensity   THRR 40-80% of Max Heartrate 106-131    Ratings of Perceived Exertion 11-13    Perceived Dyspnea 0-4      Progression   Progression Continue to progress workloads to maintain intensity without signs/symptoms of physical distress.      Resistance Training   Training Prescription Yes    Weight 2 lb    Reps 10-15             Perform Capillary Blood Glucose checks as needed.  Exercise Prescription Changes:   Exercise  Prescription Changes     Row Name 10/03/23 1600  10/18/23 1500 10/30/23 1400 11/14/23 1000 11/27/23 1500     Response to Exercise   Blood Pressure (Admit) 122/74 120/80 118/62 110/70 138/62   Blood Pressure (Exercise) 132/72 156/80 138/72 148/76 140/65   Blood Pressure (Exit) 130/72 132/74 136/76 126/70 126/72   Heart Rate (Admit) 82 bpm 86 bpm 85 bpm 106 bpm 73 bpm   Heart Rate (Exercise) 106 bpm 106 bpm 106 bpm 112 bpm 109 bpm   Heart Rate (Exit) 84 bpm 100 bpm 93 bpm 99 bpm 81 bpm   Oxygen Saturation (Admit) 94 % 96 % 94 % 94 % 95 %   Oxygen Saturation (Exercise) 91 % 94 % 93 % 90 % 91 %   Oxygen Saturation (Exit) 93 % 96 % 98 % 97 % 91 %   Rating of Perceived Exertion (Exercise) 13 12 13 15 14    Perceived Dyspnea (Exercise) 1 1 2 2 1    Symptoms coughing none none none none   Comments results -- -- -- --   Duration -- Progress to 30 minutes of  aerobic without signs/symptoms of physical distress Progress to 30 minutes of  aerobic without signs/symptoms of physical distress Continue with 30 min of aerobic exercise without signs/symptoms of physical distress. Continue with 30 min of aerobic exercise without signs/symptoms of physical distress.   Intensity -- THRR unchanged THRR unchanged THRR unchanged THRR unchanged     Progression   Progression -- Continue to progress workloads to maintain intensity without signs/symptoms of physical distress. Continue to progress workloads to maintain intensity without signs/symptoms of physical distress. Continue to progress workloads to maintain intensity without signs/symptoms of physical distress. Continue to progress workloads to maintain intensity without signs/symptoms of physical distress.   Average METs 2.1 2.45 2.2 2.19 2.3     Resistance Training   Training Prescription -- Yes Yes Yes Yes   Weight -- 2 2 2  lb 2 lb   Reps -- 10-15 10-15 10-15 10-15     Interval Training   Interval Training -- No No No No     Treadmill   MPH -- -- -- 1.5 --   Grade -- -- -- 0 --   Minutes -- --  -- 15 --   METs -- -- -- 2.15 --     Recumbant Bike   Level -- 1 1 2 2    Watts -- 25 25 25 25    Minutes -- 15 15 15 15    METs -- 3.1 3.05 3.11 3.11     REL-XR   Level -- -- 1 1 1    Minutes -- -- 15 15 15    METs -- -- 2 1.98 1.6     T5 Nustep   Level -- 1 1 2 3    Minutes -- 15 15 15 15    METs -- 1.8 1.8 1.8 1.8     Track   Laps -- -- 18 18 18    Minutes -- -- 15 15 15    METs -- -- 1.98 1.98 1.98     Oxygen   Maintain Oxygen Saturation -- 88% or higher 88% or higher 88% or higher 88% or higher            Exercise Comments:   Exercise Comments     Row Name 10/11/23 0933           Exercise Comments First full day of exercise!  Patient was oriented to gym and equipment  including functions, settings, policies, and procedures.  Patient's individual exercise prescription and treatment plan were reviewed.  All starting workloads were established based on the results of the 6 minute walk test done at initial orientation visit.  The plan for exercise progression was also introduced and progression will be customized based on patient's performance and goals.                Exercise Goals and Review:   Exercise Goals     Row Name 10/03/23 1607             Exercise Goals   Increase Physical Activity Yes       Intervention Develop an individualized exercise prescription for aerobic and resistive training based on initial evaluation findings, risk stratification, comorbidities and participant's personal goals.;Provide advice, education, support and counseling about physical activity/exercise needs.       Expected Outcomes Long Term: Exercising regularly at least 3-5 days a week.;Short Term: Attend rehab on a regular basis to increase amount of physical activity.;Long Term: Add in home exercise to make exercise part of routine and to increase amount of physical activity.       Increase Strength and Stamina Yes       Intervention Develop an individualized exercise  prescription for aerobic and resistive training based on initial evaluation findings, risk stratification, comorbidities and participant's personal goals.;Provide advice, education, support and counseling about physical activity/exercise needs.       Expected Outcomes Long Term: Improve cardiorespiratory fitness, muscular endurance and strength as measured by increased METs and functional capacity ( );Short Term: Perform resistance training exercises routinely during rehab and add in resistance training at home;Short Term: Increase workloads from initial exercise prescription for resistance, speed, and METs.       Able to understand and use rate of perceived exertion (RPE) scale Yes       Intervention Provide education and explanation on how to use RPE scale       Expected Outcomes Long Term:  Able to use RPE to guide intensity level when exercising independently;Short Term: Able to use RPE daily in rehab to express subjective intensity level       Able to understand and use Dyspnea scale Yes       Intervention Provide education and explanation on how to use Dyspnea scale       Expected Outcomes Long Term: Able to use Dyspnea scale to guide intensity level when exercising independently;Short Term: Able to use Dyspnea scale daily in rehab to express subjective sense of shortness of breath during exertion       Knowledge and understanding of Target Heart Rate Range (THRR) Yes       Intervention Provide education and explanation of THRR including how the numbers were predicted and where they are located for reference       Expected Outcomes Long Term: Able to use THRR to govern intensity when exercising independently;Short Term: Able to use daily as guideline for intensity in rehab;Short Term: Able to state/look up THRR       Able to check pulse independently Yes       Intervention Review the importance of being able to check your own pulse for safety during independent exercise;Provide education and  demonstration on how to check pulse in carotid and radial arteries.       Expected Outcomes Long Term: Able to check pulse independently and accurately;Short Term: Able to explain why pulse checking is important during independent exercise  Understanding of Exercise Prescription Yes       Intervention Provide education, explanation, and written materials on patient's individual exercise prescription       Expected Outcomes Long Term: Able to explain home exercise prescription to exercise independently;Short Term: Able to explain program exercise prescription                Exercise Goals Re-Evaluation :  Exercise Goals Re-Evaluation     Row Name 10/11/23 0933 10/18/23 1527 10/30/23 1444 11/14/23 1008 11/27/23 1516     Exercise Goal Re-Evaluation   Exercise Goals Review Able to understand and use rate of perceived exertion (RPE) scale;Able to understand and use Dyspnea scale;Knowledge and understanding of Target Heart Rate Range (THRR);Understanding of Exercise Prescription Increase Physical Activity;Increase Strength and Stamina;Understanding of Exercise Prescription Increase Physical Activity;Increase Strength and Stamina;Understanding of Exercise Prescription Increase Physical Activity;Increase Strength and Stamina;Understanding of Exercise Prescription Increase Physical Activity;Increase Strength and Stamina;Understanding of Exercise Prescription   Comments Reviewed RPE and dyspnea scale, THR and program prescription with pt today.  Pt voiced understanding and was given a copy of goals to take home. Franshesca is off to a great start in the program. She has only attended one session during the time of this review. She has been able to use the T5 nustep and recumbent bike at level 1. We will continue to monitor her progress in the program. Shamekia is off to a great start in the program. She was only able to attend during at the time of this review. During these sessions she was able to use all  of her prescribed exercise machines at her prescribed intensities. We will continue to monitor her progress in the program. Shaneisha is doing well in the program. She recently improved to level 2 on both the T5 nustep and the recumbent bike. She also began using the treadmill and did well at a speed of 1.5 mph with no incline. She also continues to walk 18 laps on the track and use 2 lb hand weights for resistance training. We will continue to monitor her progress in the program. Avella continue to do well in the program. She was recently able to increase her level on the T5 nustep from level 2 to 3. She was also able to maintain a workload of level 2 on the recumbent bike, and 18 laps on the track. We will continue to monitor her progress in the program.   Expected Outcomes Short: Use RPE daily to regulate intensity. Long: Follow program prescription in THR. Short: Continue to follow current exercise prescription. Long: Continue exercise to improve strength and stamina. Short: Continue to follow current exercise prescription. Long: Continue exercise to improve strength and stamina. Short: Continue to progressively increase treadmill workload and push for more laps on the track. Long: Continue exercise to improve strength and stamina. Short: Continue to progressively increase treadmill workload and push for more laps on the track. Long: Continue exercise to improve strength and stamina.            Discharge Exercise Prescription (Final Exercise Prescription Changes):  Exercise Prescription Changes - 11/27/23 1500       Response to Exercise   Blood Pressure (Admit) 138/62    Blood Pressure (Exercise) 140/65    Blood Pressure (Exit) 126/72    Heart Rate (Admit) 73 bpm    Heart Rate (Exercise) 109 bpm    Heart Rate (Exit) 81 bpm    Oxygen Saturation (Admit) 95 %    Oxygen  Saturation (Exercise) 91 %    Oxygen Saturation (Exit) 91 %    Rating of Perceived Exertion (Exercise) 14    Perceived  Dyspnea (Exercise) 1    Symptoms none    Duration Continue with 30 min of aerobic exercise without signs/symptoms of physical distress.    Intensity THRR unchanged      Progression   Progression Continue to progress workloads to maintain intensity without signs/symptoms of physical distress.    Average METs 2.3      Resistance Training   Training Prescription Yes    Weight 2 lb    Reps 10-15      Interval Training   Interval Training No      Recumbant Bike   Level 2    Watts 25    Minutes 15    METs 3.11      REL-XR   Level 1    Minutes 15    METs 1.6      T5 Nustep   Level 3    Minutes 15    METs 1.8      Track   Laps 18    Minutes 15    METs 1.98      Oxygen   Maintain Oxygen Saturation 88% or higher             Nutrition:  Target Goals: Understanding of nutrition guidelines, daily intake of sodium 1500mg , cholesterol 200mg , calories 30% from fat and 7% or less from saturated fats, daily to have 5 or more servings of fruits and vegetables.  Education: All About Nutrition: -Group instruction provided by verbal, written material, interactive activities, discussions, models, and posters to present general guidelines for heart healthy nutrition including fat, fiber, MyPlate, the role of sodium in heart healthy nutrition, utilization of the nutrition label, and utilization of this knowledge for meal planning. Follow up email sent as well. Written material given at graduation.   Biometrics:  Pre Biometrics - 10/03/23 1609       Pre Biometrics   Height 5' 3.4" (1.61 m)    Weight 160 lb 9.6 oz (72.8 kg)    Waist Circumference 39.4 inches    Hip Circumference 41.5 inches    Waist to Hip Ratio 0.95 %    BMI (Calculated) 28.1    Single Leg Stand 2.88 seconds              Nutrition Therapy Plan and Nutrition Goals:  Nutrition Therapy & Goals - 11/06/23 0939       Nutrition Therapy   RD appointment deferred Yes             Nutrition  Assessments:  MEDIFICTS Score Key: >=70 Need to make dietary changes  40-70 Heart Healthy Diet <= 40 Therapeutic Level Cholesterol Diet  Flowsheet Row Pulmonary Rehab from 10/03/2023 in [facility] Cardiac and Pulmonary Rehab  Picture Your Plate Total Score on Admission 60      Picture Your Plate Scores: <81 Unhealthy dietary pattern with much room for improvement. 41-50 Dietary pattern unlikely to meet recommendations for good health and room for improvement. 51-60 More healthful dietary pattern, with some room for improvement.  >60 Healthy dietary pattern, although there may be some specific behaviors that could be improved.   Nutrition Goals Re-Evaluation:  Nutrition Goals Re-Evaluation     Row Name 11/06/23 [id number]             Goals   Comment Pt has deferred to meet with the RD at  this time.                Nutrition Goals Discharge (Final Nutrition Goals Re-Evaluation):  Nutrition Goals Re-Evaluation - 11/06/23 0939       Goals   Comment Pt has deferred to meet with the RD at this time.             Psychosocial: Target Goals: Acknowledge presence or absence of significant depression and/or stress, maximize coping skills, provide positive support system. Participant is able to verbalize types and ability to use techniques and skills needed for reducing stress and depression.   Education: Stress, Anxiety, and Depression - Group verbal and visual presentation to define topics covered.  Reviews how body is impacted by stress, anxiety, and depression.  Also discusses healthy ways to reduce stress and to treat/manage anxiety and depression.  Written material given at graduation.   Education: Sleep Hygiene -Provides group verbal and written instruction about how sleep can affect your health.  Define sleep hygiene, discuss sleep cycles and impact of sleep habits. Review good sleep hygiene tips.    Initial Review & Psychosocial Screening:  Initial Psych Review &  Screening - 10/01/23 1415       Initial Review   Current issues with None Identified      Family Dynamics   Good Support System? Yes   husband, grandson and grand-daughter     Screening Interventions   Interventions Encouraged to exercise    Expected Outcomes Short Term goal: Utilizing psychosocial counselor, staff and physician to assist with identification of specific Stressors or current issues interfering with healing process. Setting desired goal for each stressor or current issue identified.;Long Term Goal: Stressors or current issues are controlled or eliminated.;Short Term goal: Identification and review with participant of any Quality of Life or Depression concerns found by scoring the questionnaire.;Long Term goal: The participant improves quality of Life and PHQ9 Scores as seen by post scores and/or verbalization of changes             Quality of Life Scores:  Scores of 19 and below usually indicate a poorer quality of life in these areas.  A difference of  2-3 points is a clinically meaningful difference.  A difference of 2-3 points in the total score of the Quality of Life Index has been associated with significant improvement in overall quality of life, self-image, physical symptoms, and general health in studies assessing change in quality of life.  PHQ-9: Review Flowsheet  More data exists      10/03/2023 03/30/2023 03/20/2023 01/02/2023 06/13/2022  Depression screen PHQ 2/9  Decreased Interest 0 0 0 0 0  Down, Depressed, Hopeless 0 0 0 0 0  PHQ - 2 Score 0 0 0 0 0  Altered sleeping 0 0 0 0 0  Tired, decreased energy 2 0 0 0 0  Change in appetite 0 0 0 0 0  Feeling bad or failure about yourself  0 0 0 0 0  Trouble concentrating 0 0 0 0 0  Moving slowly or fidgety/restless 0 0 0 0 0  Suicidal thoughts 0 0 0 0 0  PHQ-9 Score 2 0 0 0 0  Difficult doing work/chores Somewhat difficult Not difficult at all Not difficult at all Not difficult at all Not difficult at all    Interpretation of Total Score  Total Score Depression Severity:  1-4 = Minimal depression, 5-9 = Mild depression, 10-14 = Moderate depression, 15-19 = Moderately severe depression, 20-27 = Severe  depression   Psychosocial Evaluation and Intervention:  Psychosocial Evaluation - 10/01/23 1430       Psychosocial Evaluation & Interventions   Interventions Encouraged to exercise with the program and follow exercise prescription    Comments Vision is coming to pulmonary rehab for COPD. She has no barriers to attending the program. She reports no concerns with stress, depression, or anxiety. She has a good support system at home that includes her husband and also her grand-daughter and grandson that live with her. Sherricka would like to be able to do more activities without getting short of breath and hopes to reach this goal with rehab.    Expected Outcomes Short:  Attend pulmonary rehab for education and exercise  Long:  Develop and maintain positive self care habits    Continue Psychosocial Services  Follow up required by staff             Psychosocial Re-Evaluation:  Psychosocial Re-Evaluation     Row Name 11/06/23 0922             Psychosocial Re-Evaluation   Current issues with None Identified       Comments Ashaya says that she continues to have a good support system and has no mental health concerns at this moment. She also states that she is getting good sleep.       Expected Outcomes Short: Continue to manage stress levels. Long: Continue to use her support system and continue exercise to manage stressors.       Interventions Encouraged to attend Pulmonary Rehabilitation for the exercise       Continue Psychosocial Services  Follow up required by staff                Psychosocial Discharge (Final Psychosocial Re-Evaluation):  Psychosocial Re-Evaluation - 11/06/23 7829       Psychosocial Re-Evaluation   Current issues with None Identified    Comments Kimana  says that she continues to have a good support system and has no mental health concerns at this moment. She also states that she is getting good sleep.    Expected Outcomes Short: Continue to manage stress levels. Long: Continue to use her support system and continue exercise to manage stressors.    Interventions Encouraged to attend Pulmonary Rehabilitation for the exercise    Continue Psychosocial Services  Follow up required by staff             Education: Education Goals: Education classes will be provided on a weekly basis, covering required topics. Participant will state understanding/return demonstration of topics presented.  Learning Barriers/Preferences:   General Pulmonary Education Topics:  Infection Prevention: - Provides verbal and written material to individual with discussion of infection control including proper hand washing and proper equipment cleaning during exercise session. Flowsheet Row Pulmonary Rehab from 11/22/2023 in [facility]  Date 10/03/23  Educator [facility]  Instruction Review Code 1- Verbalizes Understanding       Falls Prevention: - Provides verbal and written material to individual with discussion of falls prevention and safety. Flowsheet Row Pulmonary Rehab from 11/22/2023 in [facility] Cardiac and Pulmonary Rehab  Date 10/03/23  Educator [facility]  Instruction Review Code 1- Verbalizes Understanding       Chronic Lung Disease Review: - Group verbal instruction with posters, models, PowerPoint presentations and videos,  to review new updates, new respiratory medications, new advancements in procedures and treatments. Providing information on websites and "800" numbers for continued self-education. Includes information about  supplement oxygen, available portable oxygen systems, continuous and intermittent flow rates, oxygen safety, concentrators, and Medicare reimbursement for oxygen. Explanation of Pulmonary Drugs, including class, frequency,  complications, importance of spacers, rinsing mouth after steroid MDI's, and proper cleaning methods for nebulizers. Review of basic lung anatomy and physiology related to function, structure, and complications of lung disease. Review of risk factors. Discussion about methods for diagnosing sleep apnea and types of masks and machines for OSA. Includes a review of the use of types of environmental controls: home humidity, furnaces, filters, dust mite/pet prevention, HEPA vacuums. Discussion about weather changes, air quality and the benefits of nasal washing. Instruction on Warning signs, infection symptoms, calling MD promptly, preventive modes, and value of vaccinations. Review of effective airway clearance, coughing and/or vibration techniques. Emphasizing that all should Create an Action Plan. Written material given at graduation.   AED/CPR: - Group verbal and written instruction with the use of models to demonstrate the basic use of the AED with the basic ABC's of resuscitation.    Anatomy and Cardiac Procedures: - Group verbal and visual presentation and models provide information about basic cardiac anatomy and function. Reviews the testing methods done to diagnose heart disease and the outcomes of the test results. Describes the treatment choices: Medical Management, Angioplasty, or Coronary Bypass Surgery for treating various heart conditions including Myocardial Infarction, Angina, Valve Disease, and Cardiac Arrhythmias.  Written material given at graduation.   Medication Safety: - Group verbal and visual instruction to review commonly prescribed medications for heart and lung disease. Reviews the medication, class of the drug, and side effects. Includes the steps to properly store meds and maintain the prescription regimen.  Written material given at graduation.   Other: -Provides group and verbal instruction on various topics (see comments)   Knowledge Questionnaire Score:  Knowledge  Questionnaire Score - 10/11/23 1040       Knowledge Questionnaire Score   Pre Score 10/18              Core Components/Risk Factors/Patient Goals at Admission:  Personal Goals and Risk Factors at Admission - 10/01/23 1419       Core Components/Risk Factors/Patient Goals on Admission    Weight Management Yes    Intervention Weight Management: Develop a combined nutrition and exercise program designed to reach desired caloric intake, while maintaining appropriate intake of nutrient and fiber, sodium and fats, and appropriate energy expenditure required for the weight goal.;Weight Management: Provide education and appropriate resources to help participant work on and attain dietary goals.;Weight Management/Obesity: Establish reasonable short term and long term weight goals.    Admit Weight 157 lb (71.2 kg)    Goal Weight: Short Term 152 lb (68.9 kg)    Goal Weight: Long Term 147 lb (66.7 kg)    Expected Outcomes Short Term: Continue to assess and modify interventions until short term weight is achieved;Long Term: Adherence to nutrition and physical activity/exercise program aimed toward attainment of established weight goal;Weight Maintenance: Understanding of the daily nutrition guidelines, which includes 25-35% calories from fat, 7% or less cal from saturated fats, less than 200mg  cholesterol, less than 1.5gm of sodium, & 5 or more servings of fruits and vegetables daily;Weight Loss: Understanding of general recommendations for a balanced deficit meal plan, which promotes 1-2 lb weight loss per week and includes a negative energy balance of [phone] kcal/d;Understanding recommendations for meals to include 15-35% energy as protein, 25-35% energy from fat, 35-60% energy from carbohydrates, less than 200mg  of dietary cholesterol, 20-35  gm of total fiber daily;Understanding of distribution of calorie intake throughout the day with the consumption of 4-5 meals/snacks    Improve shortness of breath  with ADL's Yes    Intervention Provide education, individualized exercise plan and daily activity instruction to help decrease symptoms of SOB with activities of daily living.    Expected Outcomes Short Term: Improve cardiorespiratory fitness to achieve a reduction of symptoms when performing ADLs;Long Term: Be able to perform more ADLs without symptoms or delay the onset of symptoms    Increase knowledge of respiratory medications and ability to use respiratory devices properly  Yes    Intervention Provide education and demonstration as needed of appropriate use of medications, inhalers, and oxygen therapy.    Expected Outcomes Short Term: Achieves understanding of medications use. Understands that oxygen is a medication prescribed by physician. Demonstrates appropriate use of inhaler and oxygen therapy.;Long Term: Maintain appropriate use of medications, inhalers, and oxygen therapy.    Hypertension Yes    Intervention Monitor prescription use compliance.;Provide education on lifestyle modifcations including regular physical activity/exercise, weight management, moderate sodium restriction and increased consumption of fresh fruit, vegetables, and low fat dairy, alcohol moderation, and smoking cessation.    Expected Outcomes Short Term: Continued assessment and intervention until BP is < 140/67mm HG in hypertensive participants. < 130/83mm HG in hypertensive participants with diabetes, heart failure or chronic kidney disease.;Long Term: Maintenance of blood pressure at goal levels.             Education:Diabetes - Individual verbal and written instruction to review signs/symptoms of diabetes, desired ranges of glucose level fasting, after meals and with exercise. Acknowledge that pre and post exercise glucose checks will be done for 3 sessions at entry of program.   Know Your Numbers and Heart Failure: - Group verbal and visual instruction to discuss disease risk factors for cardiac and  pulmonary disease and treatment options.  Reviews associated critical values for Overweight/Obesity, Hypertension, Cholesterol, and Diabetes.  Discusses basics of heart failure: signs/symptoms and treatments.  Introduces Heart Failure Zone chart for action plan for heart failure.  Written material given at graduation. Flowsheet Row Pulmonary Rehab from 11/22/2023 in [facility] Cardiac and Pulmonary Rehab  Date 11/22/23  Educator [facility]  Instruction Review Code 1- Verbalizes Understanding       Core Components/Risk Factors/Patient Goals Review:   Goals and Risk Factor Review     Row Name 11/06/23 0927             Core Components/Risk Factors/Patient Goals Review   Personal Goals Review Weight Management/Obesity;Hypertension       Review Oweda continues to take her blood pressure at home at least once a day. Vyla states that she wants to lose around 30 lbs, and continues to manage her diet in order to hit that goal.       Expected Outcomes Shot: Continue to take blood pressure at home to manage hypertension. Long: Continue exercising to manage weight loss goals.                Core Components/Risk Factors/Patient Goals at Discharge (Final Review):   Goals and Risk Factor Review - 11/06/23 0927       Core Components/Risk Factors/Patient Goals Review   Personal Goals Review Weight Management/Obesity;Hypertension    Review Miagrace continues to take her blood pressure at home at least once a day. Lyann states that she wants to lose around 30 lbs, and continues to manage her diet in order to hit  that goal.    Expected Outcomes Shot: Continue to take blood pressure at home to manage hypertension. Long: Continue exercising to manage weight loss goals.             ITP Comments:  ITP Comments     Row Name 10/01/23 1427 10/03/23 1559 10/11/23 0933 10/17/23 1239 11/07/23 1135   ITP Comments Initial phone call completed. Dx can be found in [facility] 9/9. EP orientation scheduled for 10/16 at  2:30 PM. Completed and gym orientation. Initial ITP created and sent for review to Dr. Jinny Sanders, Medical Director. First full day of exercise!  Patient was oriented to gym and equipment including functions, settings, policies, and procedures.  Patient's individual exercise prescription and treatment plan were reviewed.  All starting workloads were established based on the results of the 6 minute walk test done at initial orientation visit.  The plan for exercise progression was also introduced and progression will be customized based on patient's performance and goals. 30 Day review completed. Medical Director ITP review done, changes made as directed, and signed approval by Medical Director.    new to program 30 Day review completed. Medical Director ITP review done, changes made as directed, and signed approval by Medical Director.    Row Name 12/05/23 0955           ITP Comments 30 Day review completed. Medical Director ITP review done, changes made as directed, and signed approval by Medical Director.                Comments:

## 2023-12-06 ENCOUNTER — Ambulatory Visit: Payer: Self-pay

## 2023-12-06 ENCOUNTER — Encounter: Payer: Medicare Other | Admitting: *Deleted

## 2023-12-06 NOTE — Telephone Encounter (Signed)
Pt has an appointment set up on 12/25/2023, she stated that her sinuses is not that bad.  She has been using her nasal spray to help with that and so far she doesn't think it is any type of infection.  Her main concern is wanting to be able to get back on the Estradiol, I did let her know that more than likely the provider will want her to still have an appointment because the providers like to discuss this before just switching patient's back and forth.  When I told her that she wanted to keep that appointment that she had already.  Please advise.

## 2023-12-06 NOTE — Telephone Encounter (Signed)
Needs appt for sinuses

## 2023-12-06 NOTE — Telephone Encounter (Signed)
Chief Complaint: medication problem Symptoms: feeling out of it, hallucinations, sleepiness, sinus pressure and pain on R side, nasal congestion and runny nose  Frequency: medication SE x 4 weeks, Sinus sx x 1 week  Pertinent Negatives: Patient denies SI, SOB, cough Disposition: [] ED /[] Urgent Care (no appt availability in office) / [] Appointment(In office/virtual)/ []  No Name Virtual Care/ [] Home Care/ [] Refused Recommended Disposition /[] [facility]/ [x]  Follow-up with PCP Additional Notes: pt is wanting to see if she can be switched back to estradiol because she doesn't like how the venlafaxine makes her feel and was tolerating estradiol fine. Pt states she is also having sinus infection sx and wanted to see if Dr. Laural Benes could prescribe something. She has been using her nasal spray but the sinus pressure and pain is the worst. Pt didn't want to come in for appt, advised I would send message back to PCP and have nurse FU when possible. If any rx sent please sent to [facility].   Summary: venlafaxine XR (EFFEXOR XR) 37.5 MG 24 hr capsule? Not tolerating   The patient states she was recently put on venlafaxine XR (EFFEXOR XR) 37.5 MG 24 hr capsule which was changed from a previous medication but she is not tolerating it well. She says she was on Estradiol but was on it too long and that is why her medicine was switched. She says the Effexor makes her really sleepy and pretty much out of it. She does not like that feeling. Please assist patient further         Reason for Disposition  [1] SEVERE pain AND [2] not improved 2 hours after pain medicine  Answer Assessment - Initial Assessment Questions 1. NAME of MEDICINE: "What medicine(s) are you calling about?"     Venlafaxine 37.5 MG 2. QUESTION: "What is your question?" (e.g., double dose of medicine, side effect)     Wanting to switch to back to estradiol.  3. PRESCRIBER: "Who prescribed the medicine?" Reason: if prescribed  by specialist, call should be referred to that group.     Dr. Laural Benes 4. SYMPTOMS: "Do you have any symptoms?" If Yes, ask: "What symptoms are you having?"  "How bad are the symptoms (e.g., mild, moderate, severe)     Feels out of it, hallucinations, sleepiness  Answer Assessment - Initial Assessment Questions 1. LOCATION: "Where does it hurt?"      head 2. ONSET: "When did the sinus pain start?"  (e.g., hours, days)      1 week 3. SEVERITY: "How bad is the pain?"   (Scale 1-10; mild, moderate or severe)   - MILD (1-3): doesn't interfere with normal activities    - MODERATE (4-7): interferes with normal activities (e.g., work or school) or awakens from sleep   - SEVERE (8-10): excruciating pain and patient unable to do any normal activities        Moderate  5. NASAL CONGESTION: "Is the nose blocked?" If Yes, ask: "Can you open it or must you breathe through your mouth?"     yes 6. NASAL DISCHARGE: "Do you have discharge from your nose?" If so ask, "What color?"     Runny nose  7. FEVER: "Do you have a fever?" If Yes, ask: "What is it, how was it measured, and when did it start?"      no 8. OTHER SYMPTOMS: "Do you have any other symptoms?" (e.g., sore throat, cough, earache, difficulty breathing)     Sinus pressure in Head and  down face on R side, cough  Protocols used: Medication Question Call-A-AH, Sinus Pain or Congestion-A-AH

## 2023-12-06 NOTE — Telephone Encounter (Signed)
She can take 1 pill of her effexor every other day for 1-2 weeks and then stop and we'll talk about restarting her estradiol at her appointment.

## 2023-12-07 NOTE — Telephone Encounter (Signed)
Called and notified patient of Dr. Durenda Age message. Patient verbalized understanding.

## 2023-12-13 ENCOUNTER — Encounter: Payer: Medicare Other | Admitting: *Deleted

## 2023-12-20 ENCOUNTER — Encounter: Payer: Medicare Other | Attending: Pulmonary Disease | Admitting: *Deleted

## 2023-12-20 DIAGNOSIS — J449 Chronic obstructive pulmonary disease, unspecified: Secondary | ICD-10-CM | POA: Insufficient documentation

## 2023-12-25 ENCOUNTER — Encounter: Payer: Self-pay | Admitting: Family Medicine

## 2023-12-25 ENCOUNTER — Ambulatory Visit (INDEPENDENT_AMBULATORY_CARE_PROVIDER_SITE_OTHER): Payer: Medicare Other | Admitting: Family Medicine

## 2023-12-25 VITALS — BP 110/74 | HR 101 | Wt 155.0 lb

## 2023-12-25 DIAGNOSIS — Z780 Asymptomatic menopausal state: Secondary | ICD-10-CM

## 2023-12-25 DIAGNOSIS — I10 Essential (primary) hypertension: Secondary | ICD-10-CM | POA: Diagnosis not present

## 2023-12-25 LAB — MICROALBUMIN, URINE WAIVED
Creatinine, Urine Waived: 200 mg/dL (ref 10–300)
Microalb, Ur Waived: 30 mg/L — ABNORMAL HIGH (ref 0–19)
Microalb/Creat Ratio: <30 mg/g (ref <30)

## 2023-12-25 MED ORDER — HYDROCHLOROTHIAZIDE 25 MG PO TABS: 12.5000 mg | ORAL_TABLET | Freq: Every day | ORAL | Status: DC | PRN

## 2023-12-25 NOTE — Assessment & Plan Note (Signed)
 Advised her to stop her hydrochlorothiazide. Call with any concerns. Continue to monitor.

## 2023-12-25 NOTE — Assessment & Plan Note (Signed)
 Doing well off estradiol. Continue to monitor. Call with any concerns.

## 2023-12-25 NOTE — Progress Notes (Signed)
 BP 110/74   Pulse (!) 101   Wt 155 lb (70.3 kg)   LMP 07/31/1981 (Approximate)   SpO2 94%   BMI 27.11 kg/m    Subjective:    Patient ID: Nancy Mack, female    DOB: Jun 02, 1946, 78 y.o.   MRN: [id number]  HPI: Nancy Mack is a 78 y.o. female  Chief Complaint  Patient presents with   Menopause        Hypertension   Did not do well on the effexor . She has been doing well with the hot flashes. She is having one about every 3-4 days or so. No concerns.   HYPERTENSION- has not been taking her hydrochlorothiazide  daily. Rarely taking it.  Hypertension status: controlled  Satisfied with current treatment? yes Duration of hypertension: chronic BP monitoring frequency:  daily BP medication side effects:  no Medication compliance:  has not been taking it Previous BP meds:hctz Aspirin : yes Recurrent headaches: no Visual changes: no Palpitations: no Dyspnea: no Chest pain: no Lower extremity edema: no Dizzy/lightheaded: no   Relevant past medical, surgical, family and social history reviewed and updated as indicated. Interim medical history since our last visit reviewed. Allergies and medications reviewed and updated.  Review of Systems  Constitutional: Negative.   Respiratory: Negative.    Cardiovascular: Negative.   Musculoskeletal: Negative.   Neurological: Negative.   Psychiatric/Behavioral: Negative.      Per HPI unless specifically indicated above     Objective:    BP 110/74   Pulse (!) 101   Wt 155 lb (70.3 kg)   LMP 07/31/1981 (Approximate)   SpO2 94%   BMI 27.11 kg/m   Wt Readings from Last 3 Encounters:  12/25/23 155 lb (70.3 kg)  11/07/23 155 lb 12.8 oz (70.7 kg)  10/03/23 160 lb 9.6 oz (72.8 kg)    Physical Exam Vitals and nursing note reviewed.  Constitutional:      General: She is not in acute distress.    Appearance: Normal appearance. She is not ill-appearing, toxic-appearing or diaphoretic.  HENT:     Head:  Normocephalic and atraumatic.     Right Ear: External ear normal.     Left Ear: External ear normal.     Nose: Nose normal.     Mouth/Throat:     Mouth: Mucous membranes are moist.     Pharynx: Oropharynx is clear.  Eyes:     General: No scleral icterus.       Right eye: No discharge.        Left eye: No discharge.     Extraocular Movements: Extraocular movements intact.     Conjunctiva/sclera: Conjunctivae normal.     Pupils: Pupils are equal, round, and reactive to light.  Cardiovascular:     Rate and Rhythm: Normal rate and regular rhythm.     Pulses: Normal pulses.     Heart sounds: Normal heart sounds. No murmur heard.    No friction rub. No gallop.  Pulmonary:     Effort: Pulmonary effort is normal. No respiratory distress.     Breath sounds: Normal breath sounds. No stridor. No wheezing, rhonchi or rales.  Chest:     Chest wall: No tenderness.  Musculoskeletal:        General: Normal range of motion.     Cervical back: Normal range of motion and neck supple.  Skin:    General: Skin is warm and dry.     Capillary Refill: Capillary refill takes less than  2 seconds.     Coloration: Skin is not jaundiced or pale.     Findings: No bruising, erythema, lesion or rash.  Neurological:     General: No focal deficit present.     Mental Status: She is alert and oriented to person, place, and time. Mental status is at baseline.  Psychiatric:        Mood and Affect: Mood normal.        Behavior: Behavior normal.        Thought Content: Thought content normal.        Judgment: Judgment normal.     Results for orders placed or performed in visit on 11/07/23  Lipid Panel w/o Chol/HDL Ratio   Collection Time: 11/07/23 11:48 AM  Result Value Ref Range   Cholesterol, Total 169 100 - 199 mg/dL   Triglycerides 892 0 - 149 mg/dL   HDL 62 >60 mg/dL   VLDL Cholesterol Cal 19 5 - 40 mg/dL   LDL Chol Calc (NIH) 88 0 - 99 mg/dL  CBC with Differential/Platelet   Collection Time:  11/07/23 11:48 AM  Result Value Ref Range   WBC 6.2 3.4 - 10.8 x10E3/uL   RBC 4.80 3.77 - 5.28 x10E6/uL   Hemoglobin 12.4 11.1 - 15.9 g/dL   Hematocrit 60.0 65.9 - 46.6 %   MCV 83 79 - 97 fL   MCH 25.8 (L) 26.6 - 33.0 pg   MCHC 31.1 (L) 31.5 - 35.7 g/dL   RDW 85.7 88.2 - 84.5 %   Platelets 362 150 - 450 x10E3/uL   Neutrophils 63 Not Estab. %   Lymphs 21 Not Estab. %   Monocytes 9 Not Estab. %   Eos 6 Not Estab. %   Basos 1 Not Estab. %   Neutrophils Absolute 3.9 1.4 - 7.0 x10E3/uL   Lymphocytes Absolute 1.3 0.7 - 3.1 x10E3/uL   Monocytes Absolute 0.5 0.1 - 0.9 x10E3/uL   EOS (ABSOLUTE) 0.4 0.0 - 0.4 x10E3/uL   Basophils Absolute 0.1 0.0 - 0.2 x10E3/uL   Immature Granulocytes 0 Not Estab. %   Immature Grans (Abs) 0.0 0.0 - 0.1 x10E3/uL  Comprehensive metabolic panel   Collection Time: 11/07/23 11:48 AM  Result Value Ref Range   Glucose 95 70 - 99 mg/dL   BUN 10 8 - 27 mg/dL   Creatinine, Ser 9.02 0.57 - 1.00 mg/dL   eGFR 61 >40 fO/fpw/8.26   BUN/Creatinine Ratio 10 (L) 12 - 28   Sodium 142 134 - 144 mmol/L   Potassium 4.2 3.5 - 5.2 mmol/L   Chloride 106 96 - 106 mmol/L   CO2 24 20 - 29 mmol/L   Calcium  9.6 8.7 - 10.3 mg/dL   Total Protein 6.0 6.0 - 8.5 g/dL   Albumin 4.0 3.8 - 4.8 g/dL   Globulin, Total 2.0 1.5 - 4.5 g/dL   Bilirubin Total 0.3 0.0 - 1.2 mg/dL   Alkaline Phosphatase 118 44 - 121 IU/L   AST 28 0 - 40 IU/L   ALT 20 0 - 32 IU/L  VITAMIN D  25 Hydroxy (Vit-D Deficiency, Fractures)   Collection Time: 11/07/23 11:48 AM  Result Value Ref Range   Vit D, 25-Hydroxy 38.8 30.0 - 100.0 ng/mL      Assessment & Plan:   Problem List Items Addressed This Visit       Cardiovascular and Mediastinum   Benign essential HTN - Primary   Advised her to stop her hydrochlorothiazide . Call with any concerns. Continue to monitor.  Relevant Orders   Microalbumin, Urine Waived     Other   Menopause   Doing well off estradiol . Continue to monitor. Call with any  concerns.       Other Visit Diagnoses       Interstitial pulmonary disease (HCC)   (Chronic)          Follow up plan: Return in about 19 weeks (around 05/06/2024) for physical.

## 2023-12-27 ENCOUNTER — Encounter: Payer: Medicare Other | Admitting: *Deleted

## 2023-12-31 ENCOUNTER — Telehealth: Payer: Self-pay | Admitting: *Deleted

## 2023-12-31 ENCOUNTER — Encounter: Payer: Self-pay | Admitting: *Deleted

## 2023-12-31 DIAGNOSIS — J449 Chronic obstructive pulmonary disease, unspecified: Secondary | ICD-10-CM

## 2023-12-31 NOTE — Telephone Encounter (Signed)
 Called to check on patient. She has not attended since 12/10. Left message for patient to call us back.

## 2024-01-02 ENCOUNTER — Encounter: Payer: Self-pay | Admitting: *Deleted

## 2024-01-02 DIAGNOSIS — J449 Chronic obstructive pulmonary disease, unspecified: Secondary | ICD-10-CM

## 2024-01-02 NOTE — Progress Notes (Signed)
 Pulmonary Individual Treatment Plan  Patient Details  Name: Nancy Mack MRN: [id number] Date of Birth: 05-Jul-1946 Referring Provider:   Flowsheet Row Pulmonary Rehab from 10/03/2023 in [facility] Cardiac and Pulmonary Rehab  Referring Provider Dr. Erskin Hearing       Initial Encounter Date:  Flowsheet Row Pulmonary Rehab from 10/03/2023 in [facility] Cardiac and Pulmonary Rehab  Date 10/03/23       Visit Diagnosis: Chronic obstructive pulmonary disease, unspecified COPD type (HCC)  Patient's Home Medications on Admission:  Current Outpatient Medications:    acetaminophen  (TYLENOL ) 500 MG tablet, Take 1,000 mg by mouth every 6 (six) hours as needed., Disp: , Rfl:    albuterol  (VENTOLIN  HFA) 108 (90 Base) MCG/ACT inhaler, Inhale 2 puffs into the lungs every 6 (six) hours as needed., Disp: 25.5 g, Rfl: 12   Ascorbic Acid (VITAMIN C) 100 MG tablet, Take 100 mg by mouth daily., Disp: , Rfl:    aspirin  EC 81 MG tablet, Take 81 mg by mouth daily. Swallow whole., Disp: , Rfl:    atorvastatin  (LIPITOR) 80 MG tablet, Take 1 tablet (80 mg total) by mouth daily., Disp: 90 tablet, Rfl: 1   azelastine  (ASTELIN ) 0.1 % nasal spray, Place 1 spray 2 (two) times daily into both nostrils. Use in each nostril as directed, Disp: 90 mL, Rfl: 0   Biotin 1000 MCG tablet, Take 1,000 mcg by mouth daily., Disp: , Rfl:    Cholecalciferol (VITAMIN D ) 50 MCG (2000 UT) tablet, Take 2,000 Units by mouth daily., Disp: , Rfl:    fluticasone  (FLONASE ) 50 MCG/ACT nasal spray, Place 1 spray into both nostrils in the morning and at bedtime., Disp: 16 g, Rfl: 12   Fluticasone -Umeclidin-Vilant (TRELEGY ELLIPTA ) 100-62.5-25 MCG/ACT AEPB, Inhale 1 puff into the lungs daily., Disp: 3 each, Rfl: 4   latanoprost (XALATAN) 0.005 % ophthalmic solution, SMARTSIG:In Eye(s), Disp: , Rfl:    montelukast  (SINGULAIR ) 10 MG tablet, Take 1 tablet (10 mg total) by mouth at bedtime., Disp: 90 tablet, Rfl: 1   triamcinolone  cream (KENALOG )  0.1 %, Apply 1 application topically 2 (two) times daily. (Patient taking differently: Apply 1 application  topically 2 (two) times daily as needed (irritation).), Disp: 30 g, Rfl: 0  Past Medical History: Past Medical History:  Diagnosis Date   Allergy    Anemia    Arthritis    Asthma    Carpal tunnel syndrome    Chronic kidney disease    Dyspnea    Eczema    Globus pharyngeus    Hyperlipidemia    Hypertension    Laryngopharyngeal reflux    Menopause    Neuroma    right foot   Osteoporosis    Pneumonia    Raynaud's disease    Rhinitis due to pollen    SOB (shortness of breath)     Tobacco Use: Social History   Tobacco Use  Smoking Status Former   Current packs/day: 0.00   Average packs/day: 2.0 packs/day for 2.0 years (4.0 ttl pk-yrs)   Types: Cigarettes   Start date: 12/18/1976   Quit date: 12/18/1978   Years since quitting: 45.0  Smokeless Tobacco Never  Tobacco Comments   quit in 1980    Labs: Review Flowsheet  More data exists      Latest Ref Rng & Units 05/04/2021 12/13/2021 06/13/2022 01/02/2023 11/07/2023  Labs for ITP Cardiac and Pulmonary Rehab  Cholestrol 100 - 199 mg/dL 409  811  914  782  956   LDL (  calc) 0 - 99 mg/dL 90  191  478  295  88   HDL-C >39 mg/dL 73  73  63  74  62   Trlycerides 0 - 149 mg/dL 621  95  308  657  846      Pulmonary Assessment Scores:  Pulmonary Assessment Scores     Row Name 10/03/23 1555         ADL UCSD   ADL Phase Entry     SOB Score total 34     Rest 1     Walk 3     Stairs 5     Bath 0     Dress 1     Shop 4       CAT Score   CAT Score 24       mMRC Score   mMRC Score 3              UCSD: Self-administered rating of dyspnea associated with activities of daily living (ADLs) 6-point scale (0 = "not at all" to 5 = "maximal or unable to do because of breathlessness")  Scoring Scores range from 0 to 120.  Minimally important difference is 5 units  CAT: CAT can identify the health impairment of  COPD patients and is better correlated with disease progression.  CAT has a scoring range of zero to 40. The CAT score is classified into four groups of low (less than 10), medium (10 - 20), high (21-30) and very high (31-40) based on the impact level of disease on health status. A CAT score over 10 suggests significant symptoms.  A worsening CAT score could be explained by an exacerbation, poor medication adherence, poor inhaler technique, or progression of COPD or comorbid conditions.  CAT MCID is 2 points  mMRC: mMRC (Modified Medical Research Council) Dyspnea Scale is used to assess the degree of baseline functional disability in patients of respiratory disease due to dyspnea. No minimal important difference is established. A decrease in score of 1 point or greater is considered a positive change.   Pulmonary Function Assessment:   Exercise Target Goals: Exercise Program Goal: Individual exercise prescription set using results from initial 6 min walk test and THRR while considering  patient's activity barriers and safety.   Exercise Prescription Goal: Initial exercise prescription builds to 30-45 minutes a day of aerobic activity, 2-3 days per week.  Home exercise guidelines will be given to patient during program as part of exercise prescription that the participant will acknowledge.  Education: Aerobic Exercise: - Group verbal and visual presentation on the components of exercise prescription. Introduces F.I.T.T principle from ACSM for exercise prescriptions.  Reviews F.I.T.T. principles of aerobic exercise including progression. Written material given at graduation. Flowsheet Row Pulmonary Rehab from 11/22/2023 in [facility] Cardiac and Pulmonary Rehab  Date 10/18/23  Educator MB  Instruction Review Code 1- Verbalizes Understanding       Education: Resistance Exercise: - Group verbal and visual presentation on the components of exercise prescription. Introduces F.I.T.T principle from  ACSM for exercise prescriptions  Reviews F.I.T.T. principles of resistance exercise including progression. Written material given at graduation. Flowsheet Row Pulmonary Rehab from 11/22/2023 in [facility] Cardiac and Pulmonary Rehab  Date 10/11/23  Educator MB  Instruction Review Code 1- [employer] Understanding        Education: Exercise & Equipment Safety: - Individual verbal instruction and demonstration of equipment use and safety with use of the equipment. Flowsheet Row Pulmonary Rehab from 11/22/2023 in [facility] Cardiac  and Pulmonary Rehab  Date 10/03/23  Educator [facility]  Instruction Review Code 1- Verbalizes Understanding       Education: Exercise Physiology & General Exercise Guidelines: - Group verbal and written instruction with models to review the exercise physiology of the cardiovascular system and associated critical values. Provides general exercise guidelines with specific guidelines to those with heart or lung disease.    Education: Flexibility, Balance, Mind/Body Relaxation: - Group verbal and visual presentation with interactive activity on the components of exercise prescription. Introduces F.I.T.T principle from ACSM for exercise prescriptions. Reviews F.I.T.T. principles of flexibility and balance exercise training including progression. Also discusses the mind body connection.  Reviews various relaxation techniques to help reduce and manage stress (i.e. Deep breathing, progressive muscle relaxation, and visualization). Balance handout provided to take home. Written material given at graduation. Flowsheet Row Pulmonary Rehab from 11/22/2023 in [facility] Cardiac and Pulmonary Rehab  Date 10/11/23  Educator MB  Instruction Review Code 1- Verbalizes Understanding       Activity Barriers & Risk Stratification:  Activity Barriers & Cardiac Risk Stratification - 10/03/23 1603       Activity Barriers & Cardiac Risk Stratification   Activity Barriers Arthritis             6  Minute Walk:  6 Minute Walk     Row Name 10/03/23 1600         6 Minute Walk   Phase Initial     Distance 1060 feet     Walk Time 6 minutes     # of Rest Breaks 0     MPH 2     METS 2.1     RPE 13     Perceived Dyspnea  1     VO2 Peak 7.4     Symptoms Yes (comment)     Comments Coughing     Resting HR 82 bpm     Resting BP 122/74     Resting Oxygen Saturation  94 %     Exercise Oxygen Saturation  during 6 min walk 91 %     Max Ex. HR 106 bpm     Max Ex. BP 132/72     2 Minute Post BP 130/72       Interval HR   1 Minute HR 97     2 Minute HR 102     3 Minute HR 106     4 Minute HR 105     5 Minute HR 106     6 Minute HR 107     Interval Heart Rate? Yes       Interval Oxygen   Interval Oxygen? Yes     Baseline Oxygen Saturation % 94 %     1 Minute Oxygen Saturation % 92 %     1 Minute Liters of Oxygen 0 L     2 Minute Oxygen Saturation % 91 %     2 Minute Liters of Oxygen 0 L     3 Minute Oxygen Saturation % 93 %     3 Minute Liters of Oxygen 0 L     4 Minute Oxygen Saturation % 93 %     4 Minute Liters of Oxygen 0 L     5 Minute Oxygen Saturation % 92 %     5 Minute Liters of Oxygen 0 L     6 Minute Oxygen Saturation % 91 %     6 Minute Liters of Oxygen 0 L  2 Minute Post Oxygen Saturation % 93 %     2 Minute Post Liters of Oxygen 0 L             Oxygen Initial Assessment:  Oxygen Initial Assessment - 10/03/23 1613       Home Oxygen   Home Oxygen Device None    Sleep Oxygen Prescription None    Home Exercise Oxygen Prescription None    Home Resting Oxygen Prescription None      Initial 6 min Walk   Oxygen Used None      Program Oxygen Prescription   Program Oxygen Prescription None             Oxygen Re-Evaluation:  Oxygen Re-Evaluation     Row Name 10/11/23 0934             Goals/Expected Outcomes   Comments Reviewed PLB technique with pt.  Talked about how it works and it's importance in maintaining their exercise  saturations.       Goals/Expected Outcomes Short: Become more profiecient at using PLB. Long: Become independent at using PLB.                Oxygen Discharge (Final Oxygen Re-Evaluation):  Oxygen Re-Evaluation - 10/11/23 0934       Goals/Expected Outcomes   Comments Reviewed PLB technique with pt.  Talked about how it works and it's importance in maintaining their exercise saturations.    Goals/Expected Outcomes Short: Become more profiecient at using PLB. Long: Become independent at using PLB.             Initial Exercise Prescription:  Initial Exercise Prescription - 10/03/23 1600       Date of Initial Exercise RX and Referring Provider   Date 10/03/23    Referring Provider Dr. Erskin Hearing      Oxygen   Maintain Oxygen Saturation 88% or higher      Recumbant Bike   Level 2    RPM 50    Watts 25    Minutes 15    METs 2.1      REL-XR   Level 1    Speed 50    Minutes 15    METs 2.1      T5 Nustep   Level 2    SPM 80    Minutes 15    METs 2.1      Track   Laps 20    Minutes 15    METs 2.1      Prescription Details   Duration Progress to 30 minutes of continuous aerobic without signs/symptoms of physical distress      Intensity   THRR 40-80% of Max Heartrate 106-131    Ratings of Perceived Exertion 11-13    Perceived Dyspnea 0-4      Progression   Progression Continue to progress workloads to maintain intensity without signs/symptoms of physical distress.      Resistance Training   Training Prescription Yes    Weight 2 lb    Reps 10-15             Perform Capillary Blood Glucose checks as needed.  Exercise Prescription Changes:   Exercise Prescription Changes     Row Name 10/03/23 1600 10/18/23 1500 10/30/23 1400 11/14/23 1000 11/27/23 1500     Response to Exercise   Blood Pressure (Admit) 122/74 120/80 118/62 110/70 138/62   Blood Pressure (Exercise) 132/72 156/80 138/72 148/76 140/65   Blood Pressure (Exit) 130/72 132/74  136/76 126/70 126/72   Heart Rate (Admit) 82 bpm 86 bpm 85 bpm 106 bpm 73 bpm   Heart Rate (Exercise) 106 bpm 106 bpm 106 bpm 112 bpm 109 bpm   Heart Rate (Exit) 84 bpm 100 bpm 93 bpm 99 bpm 81 bpm   Oxygen Saturation (Admit) 94 % 96 % 94 % 94 % 95 %   Oxygen Saturation (Exercise) 91 % 94 % 93 % 90 % 91 %   Oxygen Saturation (Exit) 93 % 96 % 98 % 97 % 91 %   Rating of Perceived Exertion (Exercise) 13 12 13 15 14    Perceived Dyspnea (Exercise) 1 1 2 2 1    Symptoms coughing none none none none   Comments results -- -- -- --   Duration -- Progress to 30 minutes of  aerobic without signs/symptoms of physical distress Progress to 30 minutes of  aerobic without signs/symptoms of physical distress Continue with 30 min of aerobic exercise without signs/symptoms of physical distress. Continue with 30 min of aerobic exercise without signs/symptoms of physical distress.   Intensity -- THRR unchanged THRR unchanged THRR unchanged THRR unchanged     Progression   Progression -- Continue to progress workloads to maintain intensity without signs/symptoms of physical distress. Continue to progress workloads to maintain intensity without signs/symptoms of physical distress. Continue to progress workloads to maintain intensity without signs/symptoms of physical distress. Continue to progress workloads to maintain intensity without signs/symptoms of physical distress.   Average METs 2.1 2.45 2.2 2.19 2.3     Resistance Training   Training Prescription -- Yes Yes Yes Yes   Weight -- 2 2 2  lb 2 lb   Reps -- 10-15 10-15 10-15 10-15     Interval Training   Interval Training -- No No No No     Treadmill   MPH -- -- -- 1.5 --   Grade -- -- -- 0 --   Minutes -- -- -- 15 --   METs -- -- -- 2.15 --     Recumbant Bike   Level -- 1 1 2 2    Watts -- 25 25 25 25    Minutes -- 15 15 15 15    METs -- 3.1 3.05 3.11 3.11     REL-XR   Level -- -- 1 1 1    Minutes -- -- 15 15 15    METs -- -- 2 1.98 1.6     T5  Nustep   Level -- 1 1 2 3    Minutes -- 15 15 15 15    METs -- 1.8 1.8 1.8 1.8     Track   Laps -- -- 18 18 18    Minutes -- -- 15 15 15    METs -- -- 1.98 1.98 1.98     Oxygen   Maintain Oxygen Saturation -- 88% or higher 88% or higher 88% or higher 88% or higher    Row Name 12/13/23 1500             Response to Exercise   Blood Pressure (Admit) 144/72       Blood Pressure (Exercise) 138/72       Blood Pressure (Exit) 130/64       Heart Rate (Admit) 97 bpm       Heart Rate (Exercise) 108 bpm       Heart Rate (Exit) 89 bpm       Oxygen Saturation (Admit) 94 %       Oxygen Saturation (Exercise) 94 %  Oxygen Saturation (Exit) 98 %       Rating of Perceived Exertion (Exercise) 13       Symptoms none       Duration Continue with 30 min of aerobic exercise without signs/symptoms of physical distress.       Intensity THRR unchanged         Progression   Progression Continue to progress workloads to maintain intensity without signs/symptoms of physical distress.       Average METs 2.27         Resistance Training   Training Prescription Yes       Weight 2 lb       Reps 10-15         Interval Training   Interval Training No         REL-XR   Level 1       Minutes 15       METs 1.9         Track   Laps 30       Minutes 15       METs 2.63         Oxygen   Maintain Oxygen Saturation 88% or higher                Exercise Comments:   Exercise Comments     Row Name 10/11/23 0933           Exercise Comments First full day of exercise!  Patient was oriented to gym and equipment including functions, settings, policies, and procedures.  Patient's individual exercise prescription and treatment plan were reviewed.  All starting workloads were established based on the results of the 6 minute walk test done at initial orientation visit.  The plan for exercise progression was also introduced and progression will be customized based on patient's performance and  goals.                Exercise Goals and Review:   Exercise Goals     Row Name 10/03/23 1607             Exercise Goals   Increase Physical Activity Yes       Intervention Develop an individualized exercise prescription for aerobic and resistive training based on initial evaluation findings, risk stratification, comorbidities and participant's personal goals.;Provide advice, education, support and counseling about physical activity/exercise needs.       Expected Outcomes Long Term: Exercising regularly at least 3-5 days a week.;Short Term: Attend rehab on a regular basis to increase amount of physical activity.;Long Term: Add in home exercise to make exercise part of routine and to increase amount of physical activity.       Increase Strength and Stamina Yes       Intervention Develop an individualized exercise prescription for aerobic and resistive training based on initial evaluation findings, risk stratification, comorbidities and participant's personal goals.;Provide advice, education, support and counseling about physical activity/exercise needs.       Expected Outcomes Long Term: Improve cardiorespiratory fitness, muscular endurance and strength as measured by increased METs and functional capacity ( );Short Term: Perform resistance training exercises routinely during rehab and add in resistance training at home;Short Term: Increase workloads from initial exercise prescription for resistance, speed, and METs.       Able to understand and use rate of perceived exertion (RPE) scale Yes       Intervention Provide education and explanation on how to use RPE scale  Expected Outcomes Long Term:  Able to use RPE to guide intensity level when exercising independently;Short Term: Able to use RPE daily in rehab to express subjective intensity level       Able to understand and use Dyspnea scale Yes       Intervention Provide education and explanation on how to use Dyspnea scale        Expected Outcomes Long Term: Able to use Dyspnea scale to guide intensity level when exercising independently;Short Term: Able to use Dyspnea scale daily in rehab to express subjective sense of shortness of breath during exertion       Knowledge and understanding of Target Heart Rate Range (THRR) Yes       Intervention Provide education and explanation of THRR including how the numbers were predicted and where they are located for reference       Expected Outcomes Long Term: Able to use THRR to govern intensity when exercising independently;Short Term: Able to use daily as guideline for intensity in rehab;Short Term: Able to state/look up THRR       Able to check pulse independently Yes       Intervention Review the importance of being able to check your own pulse for safety during independent exercise;Provide education and demonstration on how to check pulse in carotid and radial arteries.       Expected Outcomes Long Term: Able to check pulse independently and accurately;Short Term: Able to explain why pulse checking is important during independent exercise       Understanding of Exercise Prescription Yes       Intervention Provide education, explanation, and written materials on patient's individual exercise prescription       Expected Outcomes Long Term: Able to explain home exercise prescription to exercise independently;Short Term: Able to explain program exercise prescription                Exercise Goals Re-Evaluation :  Exercise Goals Re-Evaluation     Row Name 10/11/23 0933 10/18/23 1527 10/30/23 1444 11/14/23 1008 11/27/23 1516     Exercise Goal Re-Evaluation   Exercise Goals Review Able to understand and use rate of perceived exertion (RPE) scale;Able to understand and use Dyspnea scale;Knowledge and understanding of Target Heart Rate Range (THRR);Understanding of Exercise Prescription Increase Physical Activity;Increase Strength and Stamina;Understanding of Exercise  Prescription Increase Physical Activity;Increase Strength and Stamina;Understanding of Exercise Prescription Increase Physical Activity;Increase Strength and Stamina;Understanding of Exercise Prescription Increase Physical Activity;Increase Strength and Stamina;Understanding of Exercise Prescription   Comments Reviewed RPE and dyspnea scale, THR and program prescription with pt today.  Pt voiced understanding and was given a copy of goals to take home. Bao is off to a great start in the program. She has only attended one session during the time of this review. She has been able to use the T5 nustep and recumbent bike at level 1. We will continue to monitor her progress in the program. Abimbola is off to a great start in the program. She was only able to attend during at the time of this review. During these sessions she was able to use all of her prescribed exercise machines at her prescribed intensities. We will continue to monitor her progress in the program. Shahadah is doing well in the program. She recently improved to level 2 on both the T5 nustep and the recumbent bike. She also began using the treadmill and did well at a speed of 1.5 mph with no incline. She also continues to  walk 18 laps on the track and use 2 lb hand weights for resistance training. We will continue to monitor her progress in the program. Tamla continue to do well in the program. She was recently able to increase her level on the T5 nustep from level 2 to 3. She was also able to maintain a workload of level 2 on the recumbent bike, and 18 laps on the track. We will continue to monitor her progress in the program.   Expected Outcomes Short: Use RPE daily to regulate intensity. Long: Follow program prescription in THR. Short: Continue to follow current exercise prescription. Long: Continue exercise to improve strength and stamina. Short: Continue to follow current exercise prescription. Long: Continue exercise to improve strength and  stamina. Short: Continue to progressively increase treadmill workload and push for more laps on the track. Long: Continue exercise to improve strength and stamina. Short: Continue to progressively increase treadmill workload and push for more laps on the track. Long: Continue exercise to improve strength and stamina.    Row Name 12/13/23 1600 12/27/23 1546           Exercise Goal Re-Evaluation   Exercise Goals Review Increase Physical Activity;Increase Strength and Stamina;Understanding of Exercise Prescription Increase Physical Activity;Increase Strength and Stamina;Understanding of Exercise Prescription      Comments Blakelynn has only attended one session of rehab since the last review. However, during this session she continued to work at level 1 on the XR and increased to 30 laps walked on the track. We will continue to monitor her progress in the track. Airiana has not attended rehab since 12/10. we will continue to reach out in order to determine her status in the program.      Expected Outcomes Short: Attend rehab more consistently. Long: Continue exercise to improve strength and stamina. Short: Attend rehab. Long: Continue exercise to improve strength and stamina.               Discharge Exercise Prescription (Final Exercise Prescription Changes):  Exercise Prescription Changes - 12/13/23 1500       Response to Exercise   Blood Pressure (Admit) 144/72    Blood Pressure (Exercise) 138/72    Blood Pressure (Exit) 130/64    Heart Rate (Admit) 97 bpm    Heart Rate (Exercise) 108 bpm    Heart Rate (Exit) 89 bpm    Oxygen Saturation (Admit) 94 %    Oxygen Saturation (Exercise) 94 %    Oxygen Saturation (Exit) 98 %    Rating of Perceived Exertion (Exercise) 13    Symptoms none    Duration Continue with 30 min of aerobic exercise without signs/symptoms of physical distress.    Intensity THRR unchanged      Progression   Progression Continue to progress workloads to maintain  intensity without signs/symptoms of physical distress.    Average METs 2.27      Resistance Training   Training Prescription Yes    Weight 2 lb    Reps 10-15      Interval Training   Interval Training No      REL-XR   Level 1    Minutes 15    METs 1.9      Track   Laps 30    Minutes 15    METs 2.63      Oxygen   Maintain Oxygen Saturation 88% or higher             Nutrition:  Target Goals: Understanding of  nutrition guidelines, daily intake of sodium 1500mg , cholesterol 200mg , calories 30% from fat and 7% or less from saturated fats, daily to have 5 or more servings of fruits and vegetables.  Education: All About Nutrition: -Group instruction provided by verbal, written material, interactive activities, discussions, models, and posters to present general guidelines for heart healthy nutrition including fat, fiber, MyPlate, the role of sodium in heart healthy nutrition, utilization of the nutrition label, and utilization of this knowledge for meal planning. Follow up email sent as well. Written material given at graduation.   Biometrics:  Pre Biometrics - 10/03/23 1609       Pre Biometrics   Height 5' 3.4" (1.61 m)    Weight 160 lb 9.6 oz (72.8 kg)    Waist Circumference 39.4 inches    Hip Circumference 41.5 inches    Waist to Hip Ratio 0.95 %    BMI (Calculated) 28.1    Single Leg Stand 2.88 seconds              Nutrition Therapy Plan and Nutrition Goals:  Nutrition Therapy & Goals - 11/06/23 0939       Nutrition Therapy   RD appointment deferred Yes             Nutrition Assessments:  MEDIFICTS Score Key: >=70 Need to make dietary changes  40-70 Heart Healthy Diet <= 40 Therapeutic Level Cholesterol Diet  Flowsheet Row Pulmonary Rehab from 10/03/2023 in [facility] Cardiac and Pulmonary Rehab  Picture Your Plate Total Score on Admission 60      Picture Your Plate Scores: <29 Unhealthy dietary pattern with much room for improvement. 41-50  Dietary pattern unlikely to meet recommendations for good health and room for improvement. 51-60 More healthful dietary pattern, with some room for improvement.  >60 Healthy dietary pattern, although there may be some specific behaviors that could be improved.   Nutrition Goals Re-Evaluation:  Nutrition Goals Re-Evaluation     Row Name 11/06/23 [phone]             Goals   Comment Pt has deferred to meet with the RD at this time.                Nutrition Goals Discharge (Final Nutrition Goals Re-Evaluation):  Nutrition Goals Re-Evaluation - 11/06/23 0939       Goals   Comment Pt has deferred to meet with the RD at this time.             Psychosocial: Target Goals: Acknowledge presence or absence of significant depression and/or stress, maximize coping skills, provide positive support system. Participant is able to verbalize types and ability to use techniques and skills needed for reducing stress and depression.   Education: Stress, Anxiety, and Depression - Group verbal and visual presentation to define topics covered.  Reviews how body is impacted by stress, anxiety, and depression.  Also discusses healthy ways to reduce stress and to treat/manage anxiety and depression.  Written material given at graduation.   Education: Sleep Hygiene -Provides group verbal and written instruction about how sleep can affect your health.  Define sleep hygiene, discuss sleep cycles and impact of sleep habits. Review good sleep hygiene tips.    Initial Review & Psychosocial Screening:  Initial Psych Review & Screening - 10/01/23 1415       Initial Review   Current issues with None Identified      Family Dynamics   Good Support System? Yes   husband, grandson and grand-daughter  Screening Interventions   Interventions Encouraged to exercise    Expected Outcomes Short Term goal: Utilizing psychosocial counselor, staff and physician to assist with identification of specific  Stressors or current issues interfering with healing process. Setting desired goal for each stressor or current issue identified.;Long Term Goal: Stressors or current issues are controlled or eliminated.;Short Term goal: Identification and review with participant of any Quality of Life or Depression concerns found by scoring the questionnaire.;Long Term goal: The participant improves quality of Life and PHQ9 Scores as seen by post scores and/or verbalization of changes             Quality of Life Scores:  Scores of 19 and below usually indicate a poorer quality of life in these areas.  A difference of  2-3 points is a clinically meaningful difference.  A difference of 2-3 points in the total score of the Quality of Life Index has been associated with significant improvement in overall quality of life, self-image, physical symptoms, and general health in studies assessing change in quality of life.  PHQ-9: Review Flowsheet  More data exists      12/25/2023 10/03/2023 03/30/2023 03/20/2023 01/02/2023  Depression screen PHQ 2/9  Decreased Interest 0 0 0 0 0  Down, Depressed, Hopeless 0 0 0 0 0  PHQ - 2 Score 0 0 0 0 0  Altered sleeping 0 0 0 0 0  Tired, decreased energy 0 2 0 0 0  Change in appetite 0 0 0 0 0  Feeling bad or failure about yourself  0 0 0 0 0  Trouble concentrating 0 0 0 0 0  Moving slowly or fidgety/restless 0 0 0 0 0  Suicidal thoughts 0 0 0 0 0  PHQ-9 Score 0 2 0 0 0  Difficult doing work/chores Not difficult at all Somewhat difficult Not difficult at all Not difficult at all Not difficult at all   Interpretation of Total Score  Total Score Depression Severity:  1-4 = Minimal depression, 5-9 = Mild depression, 10-14 = Moderate depression, 15-19 = Moderately severe depression, 20-27 = Severe depression   Psychosocial Evaluation and Intervention:  Psychosocial Evaluation - 10/01/23 1430       Psychosocial Evaluation & Interventions   Interventions Encouraged to  exercise with the program and follow exercise prescription    Comments Opaline is coming to pulmonary rehab for COPD. She has no barriers to attending the program. She reports no concerns with stress, depression, or anxiety. She has a good support system at home that includes her husband and also her grand-daughter and grandson that live with her. Lillyahna would like to be able to do more activities without getting short of breath and hopes to reach this goal with rehab.    Expected Outcomes Short:  Attend pulmonary rehab for education and exercise  Long:  Develop and maintain positive self care habits    Continue Psychosocial Services  Follow up required by staff             Psychosocial Re-Evaluation:  Psychosocial Re-Evaluation     Row Name 11/06/23 0922             Psychosocial Re-Evaluation   Current issues with None Identified       Comments Cliffie says that she continues to have a good support system and has no mental health concerns at this moment. She also states that she is getting good sleep.       Expected Outcomes Short: Continue to manage  stress levels. Long: Continue to use her support system and continue exercise to manage stressors.       Interventions Encouraged to attend Pulmonary Rehabilitation for the exercise       Continue Psychosocial Services  Follow up required by staff                Psychosocial Discharge (Final Psychosocial Re-Evaluation):  Psychosocial Re-Evaluation - 11/06/23 6295       Psychosocial Re-Evaluation   Current issues with None Identified    Comments Angeleah says that she continues to have a good support system and has no mental health concerns at this moment. She also states that she is getting good sleep.    Expected Outcomes Short: Continue to manage stress levels. Long: Continue to use her support system and continue exercise to manage stressors.    Interventions Encouraged to attend Pulmonary Rehabilitation for the exercise     Continue Psychosocial Services  Follow up required by staff             Education: Education Goals: Education classes will be provided on a weekly basis, covering required topics. Participant will state understanding/return demonstration of topics presented.  Learning Barriers/Preferences:   General Pulmonary Education Topics:  Infection Prevention: - Provides verbal and written material to individual with discussion of infection control including proper hand washing and proper equipment cleaning during exercise session. Flowsheet Row Pulmonary Rehab from 11/22/2023 in [facility] Cardiac and Pulmonary Rehab  Date 10/03/23  Educator [facility]  Instruction Review Code 1- Verbalizes Understanding       Falls Prevention: - Provides verbal and written material to individual with discussion of falls prevention and safety. Flowsheet Row Pulmonary Rehab from 11/22/2023 in [facility] Cardiac and Pulmonary Rehab  Date 10/03/23  Educator [employer]  Instruction Review Code 1- Verbalizes Understanding       Chronic Lung Disease Review: - Group verbal instruction with posters, models, PowerPoint presentations and videos,  to review new updates, new respiratory medications, new advancements in procedures and treatments. Providing information on websites and "800" numbers for continued self-education. Includes information about supplement oxygen, available portable oxygen systems, continuous and intermittent flow rates, oxygen safety, concentrators, and Medicare reimbursement for oxygen. Explanation of Pulmonary Drugs, including class, frequency, complications, importance of spacers, rinsing mouth after steroid MDI's, and proper cleaning methods for nebulizers. Review of basic lung anatomy and physiology related to function, structure, and complications of lung disease. Review of risk factors. Discussion about methods for diagnosing sleep apnea and types of masks and machines for OSA. Includes a review of the use of types  of environmental controls: home humidity, furnaces, filters, dust mite/pet prevention, HEPA vacuums. Discussion about weather changes, air quality and the benefits of nasal washing. Instruction on Warning signs, infection symptoms, calling MD promptly, preventive modes, and value of vaccinations. Review of effective airway clearance, coughing and/or vibration techniques. Emphasizing that all should Create an Action Plan. Written material given at graduation.   AED/CPR: - Group verbal and written instruction with the use of models to demonstrate the basic use of the AED with the basic ABC's of resuscitation.    Anatomy and Cardiac Procedures: - Group verbal and visual presentation and models provide information about basic cardiac anatomy and function. Reviews the testing methods done to diagnose heart disease and the outcomes of the test results. Describes the treatment choices: Medical Management, Angioplasty, or Coronary Bypass Surgery for treating various heart conditions including Myocardial Infarction, Angina, Valve Disease, and Cardiac Arrhythmias.  Written  material given at graduation.   Medication Safety: - Group verbal and visual instruction to review commonly prescribed medications for heart and lung disease. Reviews the medication, class of the drug, and side effects. Includes the steps to properly store meds and maintain the prescription regimen.  Written material given at graduation.   Other: -Provides group and verbal instruction on various topics (see comments)   Knowledge Questionnaire Score:  Knowledge Questionnaire Score - 10/11/23 1040       Knowledge Questionnaire Score   Pre Score 10/18              Core Components/Risk Factors/Patient Goals at Admission:  Personal Goals and Risk Factors at Admission - 10/01/23 1419       Core Components/Risk Factors/Patient Goals on Admission    Weight Management Yes    Intervention Weight Management: Develop a combined  nutrition and exercise program designed to reach desired caloric intake, while maintaining appropriate intake of nutrient and fiber, sodium and fats, and appropriate energy expenditure required for the weight goal.;Weight Management: Provide education and appropriate resources to help participant work on and attain dietary goals.;Weight Management/Obesity: Establish reasonable short term and long term weight goals.    Admit Weight 157 lb (71.2 kg)    Goal Weight: Short Term 152 lb (68.9 kg)    Goal Weight: Long Term 147 lb (66.7 kg)    Expected Outcomes Short Term: Continue to assess and modify interventions until short term weight is achieved;Long Term: Adherence to nutrition and physical activity/exercise program aimed toward attainment of established weight goal;Weight Maintenance: Understanding of the daily nutrition guidelines, which includes 25-35% calories from fat, 7% or less cal from saturated fats, less than 200mg  cholesterol, less than 1.5gm of sodium, & 5 or more servings of fruits and vegetables daily;Weight Loss: Understanding of general recommendations for a balanced deficit meal plan, which promotes 1-2 lb weight loss per week and includes a negative energy balance of [phone] kcal/d;Understanding recommendations for meals to include 15-35% energy as protein, 25-35% energy from fat, 35-60% energy from carbohydrates, less than 200mg  of dietary cholesterol, 20-35 gm of total fiber daily;Understanding of distribution of calorie intake throughout the day with the consumption of 4-5 meals/snacks    Improve shortness of breath with ADL's Yes    Intervention Provide education, individualized exercise plan and daily activity instruction to help decrease symptoms of SOB with activities of daily living.    Expected Outcomes Short Term: Improve cardiorespiratory fitness to achieve a reduction of symptoms when performing ADLs;Long Term: Be able to perform more ADLs without symptoms or delay the onset of  symptoms    Increase knowledge of respiratory medications and ability to use respiratory devices properly  Yes    Intervention Provide education and demonstration as needed of appropriate use of medications, inhalers, and oxygen therapy.    Expected Outcomes Short Term: Achieves understanding of medications use. Understands that oxygen is a medication prescribed by physician. Demonstrates appropriate use of inhaler and oxygen therapy.;Long Term: Maintain appropriate use of medications, inhalers, and oxygen therapy.    Hypertension Yes    Intervention Monitor prescription use compliance.;Provide education on lifestyle modifcations including regular physical activity/exercise, weight management, moderate sodium restriction and increased consumption of fresh fruit, vegetables, and low fat dairy, alcohol moderation, and smoking cessation.    Expected Outcomes Short Term: Continued assessment and intervention until BP is < 140/26mm HG in hypertensive participants. < 130/63mm HG in hypertensive participants with diabetes, heart failure or chronic kidney disease.;Long Term:  Maintenance of blood pressure at goal levels.             Education:Diabetes - Individual verbal and written instruction to review signs/symptoms of diabetes, desired ranges of glucose level fasting, after meals and with exercise. Acknowledge that pre and post exercise glucose checks will be done for 3 sessions at entry of program.   Know Your Numbers and Heart Failure: - Group verbal and visual instruction to discuss disease risk factors for cardiac and pulmonary disease and treatment options.  Reviews associated critical values for Overweight/Obesity, Hypertension, Cholesterol, and Diabetes.  Discusses basics of heart failure: signs/symptoms and treatments.  Introduces Heart Failure Zone chart for action plan for heart failure.  Written material given at graduation. Flowsheet Row Pulmonary Rehab from 11/22/2023 in [facility] Cardiac and  Pulmonary Rehab  Date 11/22/23  Educator [facility]  Instruction Review Code 1- Verbalizes Understanding       Core Components/Risk Factors/Patient Goals Review:   Goals and Risk Factor Review     Row Name 11/06/23 0927             Core Components/Risk Factors/Patient Goals Review   Personal Goals Review Weight Management/Obesity;Hypertension       Review Brekyn continues to take her blood pressure at home at least once a day. Ziniyah states that she wants to lose around 30 lbs, and continues to manage her diet in order to hit that goal.       Expected Outcomes Shot: Continue to take blood pressure at home to manage hypertension. Long: Continue exercising to manage weight loss goals.                Core Components/Risk Factors/Patient Goals at Discharge (Final Review):   Goals and Risk Factor Review - 11/06/23 0927       Core Components/Risk Factors/Patient Goals Review   Personal Goals Review Weight Management/Obesity;Hypertension    Review Gudelia continues to take her blood pressure at home at least once a day. Vonna states that she wants to lose around 30 lbs, and continues to manage her diet in order to hit that goal.    Expected Outcomes Shot: Continue to take blood pressure at home to manage hypertension. Long: Continue exercising to manage weight loss goals.             ITP Comments:  ITP Comments     Row Name 10/01/23 1427 10/03/23 1559 10/11/23 0933 10/17/23 1239 11/07/23 1135   ITP Comments Initial phone call completed. Dx can be found in [facility] 9/9. EP orientation scheduled for 10/16 at 2:30 PM. Completed and gym orientation. Initial ITP created and sent for review to Dr. Faud Aleskerov, Medical Director. First full day of exercise!  Patient was oriented to gym and equipment including functions, settings, policies, and procedures.  Patient's individual exercise prescription and treatment plan were reviewed.  All starting workloads were established based on the  results of the 6 minute walk test done at initial orientation visit.  The plan for exercise progression was also introduced and progression will be customized based on patient's performance and goals. 30 Day review completed. Medical Director ITP review done, changes made as directed, and signed approval by Medical Director.    new to program 30 Day review completed. Medical Director ITP review done, changes made as directed, and signed approval by Medical Director.    Row Name 12/05/23 0955 12/31/23 1115 01/02/24 1301       ITP Comments 30 Day review completed. Medical Director  ITP review done, changes made as directed, and signed approval by Medical Director. Called to check on patient. She has not attended since 12/10. Left message for patient to call us  back. 30 Day review completed. Medical Director ITP review done, changes made as directed, and signed approval by Medical Director.   remains out since 12/10              Comments:

## 2024-01-07 ENCOUNTER — Encounter: Payer: Self-pay | Admitting: *Deleted

## 2024-01-07 NOTE — Telephone Encounter (Signed)
Drop letter sent 1/20 via MyChart. Will discharge 1/27 if we do not hear from her.

## 2024-01-14 ENCOUNTER — Encounter: Payer: Self-pay | Admitting: *Deleted

## 2024-01-14 DIAGNOSIS — J449 Chronic obstructive pulmonary disease, unspecified: Secondary | ICD-10-CM

## 2024-01-14 NOTE — Progress Notes (Signed)
Pulmonary Individual Treatment Plan  Patient Details  Name: LATRICIA CERRITO MRN: [id number] Date of Birth: 1946-05-06 Referring Provider:   Flowsheet Row Pulmonary Rehab from 10/03/2023 in [facility] Cardiac and Pulmonary Rehab  Referring Provider Dr. Vida Rigger       Initial Encounter Date:  Flowsheet Row Pulmonary Rehab from 10/03/2023 in [facility] Cardiac and Pulmonary Rehab  Date 10/03/23       Visit Diagnosis: Chronic obstructive pulmonary disease, unspecified COPD type (HCC)  Patient's Home Medications on Admission:  Current Outpatient Medications:    acetaminophen (TYLENOL) 500 MG tablet, Take 1,000 mg by mouth every 6 (six) hours as needed., Disp: , Rfl:    albuterol (VENTOLIN HFA) 108 (90 Base) MCG/ACT inhaler, Inhale 2 puffs into the lungs every 6 (six) hours as needed., Disp: 25.5 g, Rfl: 12   Ascorbic Acid (VITAMIN C) 100 MG tablet, Take 100 mg by mouth daily., Disp: , Rfl:    aspirin EC 81 MG tablet, Take 81 mg by mouth daily. Swallow whole., Disp: , Rfl:    atorvastatin (LIPITOR) 80 MG tablet, Take 1 tablet (80 mg total) by mouth daily., Disp: 90 tablet, Rfl: 1   azelastine (ASTELIN) 0.1 % nasal spray, Place 1 spray 2 (two) times daily into both nostrils. Use in each nostril as directed, Disp: 90 mL, Rfl: 0   Biotin 1000 MCG tablet, Take 1,000 mcg by mouth daily., Disp: , Rfl:    Cholecalciferol (VITAMIN D) 50 MCG (2000 UT) tablet, Take 2,000 Units by mouth daily., Disp: , Rfl:    fluticasone (FLONASE) 50 MCG/ACT nasal spray, Place 1 spray into both nostrils in the morning and at bedtime., Disp: 16 g, Rfl: 12   Fluticasone-Umeclidin-Vilant (TRELEGY ELLIPTA) 100-62.5-25 MCG/ACT AEPB, Inhale 1 puff into the lungs daily., Disp: 3 each, Rfl: 4   latanoprost (XALATAN) 0.005 % ophthalmic solution, SMARTSIG:In Eye(s), Disp: , Rfl:    montelukast (SINGULAIR) 10 MG tablet, Take 1 tablet (10 mg total) by mouth at bedtime., Disp: 90 tablet, Rfl: 1   triamcinolone cream (KENALOG)  0.1 %, Apply 1 application topically 2 (two) times daily. (Patient taking differently: Apply 1 application  topically 2 (two) times daily as needed (irritation).), Disp: 30 g, Rfl: 0  Past Medical History: Past Medical History:  Diagnosis Date   Allergy    Anemia    Arthritis    Asthma    Carpal tunnel syndrome    Chronic kidney disease    Dyspnea    Eczema    Globus pharyngeus    Hyperlipidemia    Hypertension    Laryngopharyngeal reflux    Menopause    Neuroma    right foot   Osteoporosis    Pneumonia    Raynaud's disease    Rhinitis due to pollen    SOB (shortness of breath)     Tobacco Use: Social History   Tobacco Use  Smoking Status Former   Current packs/day: 0.00   Average packs/day: 2.0 packs/day for 2.0 years (4.0 ttl pk-yrs)   Types: Cigarettes   Start date: 12/18/1976   Quit date: 12/18/1978   Years since quitting: 45.1  Smokeless Tobacco Never  Tobacco Comments   quit in 1980    Labs: Review Flowsheet  More data exists      Latest Ref Rng & Units 05/04/2021 12/13/2021 06/13/2022 01/02/2023 11/07/2023  Labs for ITP Cardiac and Pulmonary Rehab  Cholestrol 100 - 199 mg/dL 409  811  914  782  956   LDL (  calc) 0 - 99 mg/dL 90  161  096  045  88   HDL-C >39 mg/dL 73  73  63  74  62   Trlycerides 0 - 149 mg/dL 409  95  811  914  782      Pulmonary Assessment Scores:  Pulmonary Assessment Scores     Row Name 10/03/23 1555         ADL UCSD   ADL Phase Entry     SOB Score total 34     Rest 1     Walk 3     Stairs 5     Bath 0     Dress 1     Shop 4       CAT Score   CAT Score 24       mMRC Score   mMRC Score 3              UCSD: Self-administered rating of dyspnea associated with activities of daily living (ADLs) 6-point scale (0 = "not at all" to 5 = "maximal or unable to do because of breathlessness")  Scoring Scores range from 0 to 120.  Minimally important difference is 5 units  CAT: CAT can identify the health impairment of  COPD patients and is better correlated with disease progression.  CAT has a scoring range of zero to 40. The CAT score is classified into four groups of low (less than 10), medium (10 - 20), high (21-30) and very high (31-40) based on the impact level of disease on health status. A CAT score over 10 suggests significant symptoms.  A worsening CAT score could be explained by an exacerbation, poor medication adherence, poor inhaler technique, or progression of COPD or comorbid conditions.  CAT MCID is 2 points  mMRC: mMRC (Modified Medical Research Council) Dyspnea Scale is used to assess the degree of baseline functional disability in patients of respiratory disease due to dyspnea. No minimal important difference is established. A decrease in score of 1 point or greater is considered a positive change.   Pulmonary Function Assessment:   Exercise Target Goals: Exercise Program Goal: Individual exercise prescription set using results from initial 6 min walk test and THRR while considering  patient's activity barriers and safety.   Exercise Prescription Goal: Initial exercise prescription builds to 30-45 minutes a day of aerobic activity, 2-3 days per week.  Home exercise guidelines will be given to patient during program as part of exercise prescription that the participant will acknowledge.  Education: Aerobic Exercise: - Group verbal and visual presentation on the components of exercise prescription. Introduces F.I.T.T principle from ACSM for exercise prescriptions.  Reviews F.I.T.T. principles of aerobic exercise including progression. Written material given at graduation. Flowsheet Row Pulmonary Rehab from 11/22/2023 in [facility] Cardiac and Pulmonary Rehab  Date 10/18/23  Educator MB  Instruction Review Code 1- Verbalizes Understanding       Education: Resistance Exercise: - Group verbal and visual presentation on the components of exercise prescription. Introduces F.I.T.T principle from  ACSM for exercise prescriptions  Reviews F.I.T.T. principles of resistance exercise including progression. Written material given at graduation. Flowsheet Row Pulmonary Rehab from 11/22/2023 in [facility] Cardiac and Pulmonary Rehab  Date 10/11/23  Educator MB  Instruction Review Code 1- [employer] Understanding        Education: Exercise & Equipment Safety: - Individual verbal instruction and demonstration of equipment use and safety with use of the equipment. Flowsheet Row Pulmonary Rehab from 11/22/2023 in [facility] Cardiac  and Pulmonary Rehab  Date 10/03/23  Educator [facility]  Instruction Review Code 1- Verbalizes Understanding       Education: Exercise Physiology & General Exercise Guidelines: - Group verbal and written instruction with models to review the exercise physiology of the cardiovascular system and associated critical values. Provides general exercise guidelines with specific guidelines to those with heart or lung disease.    Education: Flexibility, Balance, Mind/Body Relaxation: - Group verbal and visual presentation with interactive activity on the components of exercise prescription. Introduces F.I.T.T principle from ACSM for exercise prescriptions. Reviews F.I.T.T. principles of flexibility and balance exercise training including progression. Also discusses the mind body connection.  Reviews various relaxation techniques to help reduce and manage stress (i.e. Deep breathing, progressive muscle relaxation, and visualization). Balance handout provided to take home. Written material given at graduation. Flowsheet Row Pulmonary Rehab from 11/22/2023 in [facility] Cardiac and Pulmonary Rehab  Date 10/11/23  Educator MB  Instruction Review Code 1- Verbalizes Understanding       Activity Barriers & Risk Stratification:  Activity Barriers & Cardiac Risk Stratification - 10/03/23 1603       Activity Barriers & Cardiac Risk Stratification   Activity Barriers Arthritis             6  Minute Walk:  6 Minute Walk     Row Name 10/03/23 1600         6 Minute Walk   Phase Initial     Distance 1060 feet     Walk Time 6 minutes     # of Rest Breaks 0     MPH 2     METS 2.1     RPE 13     Perceived Dyspnea  1     VO2 Peak 7.4     Symptoms Yes (comment)     Comments Coughing     Resting HR 82 bpm     Resting BP 122/74     Resting Oxygen Saturation  94 %     Exercise Oxygen Saturation  during 6 min walk 91 %     Max Ex. HR 106 bpm     Max Ex. BP 132/72     2 Minute Post BP 130/72       Interval HR   1 Minute HR 97     2 Minute HR 102     3 Minute HR 106     4 Minute HR 105     5 Minute HR 106     6 Minute HR 107     Interval Heart Rate? Yes       Interval Oxygen   Interval Oxygen? Yes     Baseline Oxygen Saturation % 94 %     1 Minute Oxygen Saturation % 92 %     1 Minute Liters of Oxygen 0 L     2 Minute Oxygen Saturation % 91 %     2 Minute Liters of Oxygen 0 L     3 Minute Oxygen Saturation % 93 %     3 Minute Liters of Oxygen 0 L     4 Minute Oxygen Saturation % 93 %     4 Minute Liters of Oxygen 0 L     5 Minute Oxygen Saturation % 92 %     5 Minute Liters of Oxygen 0 L     6 Minute Oxygen Saturation % 91 %     6 Minute Liters of Oxygen 0 L  2 Minute Post Oxygen Saturation % 93 %     2 Minute Post Liters of Oxygen 0 L             Oxygen Initial Assessment:  Oxygen Initial Assessment - 10/03/23 1613       Home Oxygen   Home Oxygen Device None    Sleep Oxygen Prescription None    Home Exercise Oxygen Prescription None    Home Resting Oxygen Prescription None      Initial 6 min Walk   Oxygen Used None      Program Oxygen Prescription   Program Oxygen Prescription None             Oxygen Re-Evaluation:  Oxygen Re-Evaluation     Row Name 10/11/23 0934             Goals/Expected Outcomes   Comments Reviewed PLB technique with pt.  Talked about how it works and it's importance in maintaining their exercise  saturations.       Goals/Expected Outcomes Short: Become more profiecient at using PLB. Long: Become independent at using PLB.                Oxygen Discharge (Final Oxygen Re-Evaluation):  Oxygen Re-Evaluation - 10/11/23 0934       Goals/Expected Outcomes   Comments Reviewed PLB technique with pt.  Talked about how it works and it's importance in maintaining their exercise saturations.    Goals/Expected Outcomes Short: Become more profiecient at using PLB. Long: Become independent at using PLB.             Initial Exercise Prescription:  Initial Exercise Prescription - 10/03/23 1600       Date of Initial Exercise RX and Referring Provider   Date 10/03/23    Referring Provider Dr. Vida Rigger      Oxygen   Maintain Oxygen Saturation 88% or higher      Recumbant Bike   Level 2    RPM 50    Watts 25    Minutes 15    METs 2.1      REL-XR   Level 1    Speed 50    Minutes 15    METs 2.1      T5 Nustep   Level 2    SPM 80    Minutes 15    METs 2.1      Track   Laps 20    Minutes 15    METs 2.1      Prescription Details   Duration Progress to 30 minutes of continuous aerobic without signs/symptoms of physical distress      Intensity   THRR 40-80% of Max Heartrate 106-131    Ratings of Perceived Exertion 11-13    Perceived Dyspnea 0-4      Progression   Progression Continue to progress workloads to maintain intensity without signs/symptoms of physical distress.      Resistance Training   Training Prescription Yes    Weight 2 lb    Reps 10-15             Perform Capillary Blood Glucose checks as needed.  Exercise Prescription Changes:   Exercise Prescription Changes     Row Name 10/03/23 1600 10/18/23 1500 10/30/23 1400 11/14/23 1000 11/27/23 1500     Response to Exercise   Blood Pressure (Admit) 122/74 120/80 118/62 110/70 138/62   Blood Pressure (Exercise) 132/72 156/80 138/72 148/76 140/65   Blood Pressure (Exit) 130/72 132/74  136/76 126/70 126/72   Heart Rate (Admit) 82 bpm 86 bpm 85 bpm 106 bpm 73 bpm   Heart Rate (Exercise) 106 bpm 106 bpm 106 bpm 112 bpm 109 bpm   Heart Rate (Exit) 84 bpm 100 bpm 93 bpm 99 bpm 81 bpm   Oxygen Saturation (Admit) 94 % 96 % 94 % 94 % 95 %   Oxygen Saturation (Exercise) 91 % 94 % 93 % 90 % 91 %   Oxygen Saturation (Exit) 93 % 96 % 98 % 97 % 91 %   Rating of Perceived Exertion (Exercise) 13 12 13 15 14    Perceived Dyspnea (Exercise) 1 1 2 2 1    Symptoms coughing none none none none   Comments results -- -- -- --   Duration -- Progress to 30 minutes of  aerobic without signs/symptoms of physical distress Progress to 30 minutes of  aerobic without signs/symptoms of physical distress Continue with 30 min of aerobic exercise without signs/symptoms of physical distress. Continue with 30 min of aerobic exercise without signs/symptoms of physical distress.   Intensity -- THRR unchanged THRR unchanged THRR unchanged THRR unchanged     Progression   Progression -- Continue to progress workloads to maintain intensity without signs/symptoms of physical distress. Continue to progress workloads to maintain intensity without signs/symptoms of physical distress. Continue to progress workloads to maintain intensity without signs/symptoms of physical distress. Continue to progress workloads to maintain intensity without signs/symptoms of physical distress.   Average METs 2.1 2.45 2.2 2.19 2.3     Resistance Training   Training Prescription -- Yes Yes Yes Yes   Weight -- 2 2 2  lb 2 lb   Reps -- 10-15 10-15 10-15 10-15     Interval Training   Interval Training -- No No No No     Treadmill   MPH -- -- -- 1.5 --   Grade -- -- -- 0 --   Minutes -- -- -- 15 --   METs -- -- -- 2.15 --     Recumbant Bike   Level -- 1 1 2 2    Watts -- 25 25 25 25    Minutes -- 15 15 15 15    METs -- 3.1 3.05 3.11 3.11     REL-XR   Level -- -- 1 1 1    Minutes -- -- 15 15 15    METs -- -- 2 1.98 1.6     T5  Nustep   Level -- 1 1 2 3    Minutes -- 15 15 15 15    METs -- 1.8 1.8 1.8 1.8     Track   Laps -- -- 18 18 18    Minutes -- -- 15 15 15    METs -- -- 1.98 1.98 1.98     Oxygen   Maintain Oxygen Saturation -- 88% or higher 88% or higher 88% or higher 88% or higher    Row Name 12/13/23 1500             Response to Exercise   Blood Pressure (Admit) 144/72       Blood Pressure (Exercise) 138/72       Blood Pressure (Exit) 130/64       Heart Rate (Admit) 97 bpm       Heart Rate (Exercise) 108 bpm       Heart Rate (Exit) 89 bpm       Oxygen Saturation (Admit) 94 %       Oxygen Saturation (Exercise) 94 %  Oxygen Saturation (Exit) 98 %       Rating of Perceived Exertion (Exercise) 13       Symptoms none       Duration Continue with 30 min of aerobic exercise without signs/symptoms of physical distress.       Intensity THRR unchanged         Progression   Progression Continue to progress workloads to maintain intensity without signs/symptoms of physical distress.       Average METs 2.27         Resistance Training   Training Prescription Yes       Weight 2 lb       Reps 10-15         Interval Training   Interval Training No         REL-XR   Level 1       Minutes 15       METs 1.9         Track   Laps 30       Minutes 15       METs 2.63         Oxygen   Maintain Oxygen Saturation 88% or higher                Exercise Comments:   Exercise Comments     Row Name 10/11/23 0933           Exercise Comments First full day of exercise!  Patient was oriented to gym and equipment including functions, settings, policies, and procedures.  Patient's individual exercise prescription and treatment plan were reviewed.  All starting workloads were established based on the results of the 6 minute walk test done at initial orientation visit.  The plan for exercise progression was also introduced and progression will be customized based on patient's performance and  goals.                Exercise Goals and Review:   Exercise Goals     Row Name 10/03/23 1607             Exercise Goals   Increase Physical Activity Yes       Intervention Develop an individualized exercise prescription for aerobic and resistive training based on initial evaluation findings, risk stratification, comorbidities and participant's personal goals.;Provide advice, education, support and counseling about physical activity/exercise needs.       Expected Outcomes Long Term: Exercising regularly at least 3-5 days a week.;Short Term: Attend rehab on a regular basis to increase amount of physical activity.;Long Term: Add in home exercise to make exercise part of routine and to increase amount of physical activity.       Increase Strength and Stamina Yes       Intervention Develop an individualized exercise prescription for aerobic and resistive training based on initial evaluation findings, risk stratification, comorbidities and participant's personal goals.;Provide advice, education, support and counseling about physical activity/exercise needs.       Expected Outcomes Long Term: Improve cardiorespiratory fitness, muscular endurance and strength as measured by increased METs and functional capacity ( );Short Term: Perform resistance training exercises routinely during rehab and add in resistance training at home;Short Term: Increase workloads from initial exercise prescription for resistance, speed, and METs.       Able to understand and use rate of perceived exertion (RPE) scale Yes       Intervention Provide education and explanation on how to use RPE scale  Expected Outcomes Long Term:  Able to use RPE to guide intensity level when exercising independently;Short Term: Able to use RPE daily in rehab to express subjective intensity level       Able to understand and use Dyspnea scale Yes       Intervention Provide education and explanation on how to use Dyspnea scale        Expected Outcomes Long Term: Able to use Dyspnea scale to guide intensity level when exercising independently;Short Term: Able to use Dyspnea scale daily in rehab to express subjective sense of shortness of breath during exertion       Knowledge and understanding of Target Heart Rate Range (THRR) Yes       Intervention Provide education and explanation of THRR including how the numbers were predicted and where they are located for reference       Expected Outcomes Long Term: Able to use THRR to govern intensity when exercising independently;Short Term: Able to use daily as guideline for intensity in rehab;Short Term: Able to state/look up THRR       Able to check pulse independently Yes       Intervention Review the importance of being able to check your own pulse for safety during independent exercise;Provide education and demonstration on how to check pulse in carotid and radial arteries.       Expected Outcomes Long Term: Able to check pulse independently and accurately;Short Term: Able to explain why pulse checking is important during independent exercise       Understanding of Exercise Prescription Yes       Intervention Provide education, explanation, and written materials on patient's individual exercise prescription       Expected Outcomes Long Term: Able to explain home exercise prescription to exercise independently;Short Term: Able to explain program exercise prescription                Exercise Goals Re-Evaluation :  Exercise Goals Re-Evaluation     Row Name 10/11/23 0933 10/18/23 1527 10/30/23 1444 11/14/23 1008 11/27/23 1516     Exercise Goal Re-Evaluation   Exercise Goals Review Able to understand and use rate of perceived exertion (RPE) scale;Able to understand and use Dyspnea scale;Knowledge and understanding of Target Heart Rate Range (THRR);Understanding of Exercise Prescription Increase Physical Activity;Increase Strength and Stamina;Understanding of Exercise  Prescription Increase Physical Activity;Increase Strength and Stamina;Understanding of Exercise Prescription Increase Physical Activity;Increase Strength and Stamina;Understanding of Exercise Prescription Increase Physical Activity;Increase Strength and Stamina;Understanding of Exercise Prescription   Comments Reviewed RPE and dyspnea scale, THR and program prescription with pt today.  Pt voiced understanding and was given a copy of goals to take home. Burnice is off to a great start in the program. She has only attended one session during the time of this review. She has been able to use the T5 nustep and recumbent bike at level 1. We will continue to monitor her progress in the program. Leeyah is off to a great start in the program. She was only able to attend during at the time of this review. During these sessions she was able to use all of her prescribed exercise machines at her prescribed intensities. We will continue to monitor her progress in the program. Monserat is doing well in the program. She recently improved to level 2 on both the T5 nustep and the recumbent bike. She also began using the treadmill and did well at a speed of 1.5 mph with no incline. She also continues to  walk 18 laps on the track and use 2 lb hand weights for resistance training. We will continue to monitor her progress in the program. Adelma continue to do well in the program. She was recently able to increase her level on the T5 nustep from level 2 to 3. She was also able to maintain a workload of level 2 on the recumbent bike, and 18 laps on the track. We will continue to monitor her progress in the program.   Expected Outcomes Short: Use RPE daily to regulate intensity. Long: Follow program prescription in THR. Short: Continue to follow current exercise prescription. Long: Continue exercise to improve strength and stamina. Short: Continue to follow current exercise prescription. Long: Continue exercise to improve strength and  stamina. Short: Continue to progressively increase treadmill workload and push for more laps on the track. Long: Continue exercise to improve strength and stamina. Short: Continue to progressively increase treadmill workload and push for more laps on the track. Long: Continue exercise to improve strength and stamina.    Row Name 12/13/23 1600 12/27/23 1546 01/09/24 0758         Exercise Goal Re-Evaluation   Exercise Goals Review Increase Physical Activity;Increase Strength and Stamina;Understanding of Exercise Prescription Increase Physical Activity;Increase Strength and Stamina;Understanding of Exercise Prescription Increase Physical Activity;Increase Strength and Stamina;Understanding of Exercise Prescription     Comments Shaketta has only attended one session of rehab since the last review. However, during this session she continued to work at level 1 on the XR and increased to 30 laps walked on the track. We will continue to monitor her progress in the track. Carry has not attended rehab since 12/10. we will continue to reach out in order to determine her status in the program. Peggie has not attended rehab since 12/10. we will continue to reach out in order to determine her status in the program.     Expected Outcomes Short: Attend rehab more consistently. Long: Continue exercise to improve strength and stamina. Short: Attend rehab. Long: Continue exercise to improve strength and stamina. Short: Attend rehab. Long: Continue exercise to improve strength and stamina.              Discharge Exercise Prescription (Final Exercise Prescription Changes):  Exercise Prescription Changes - 12/13/23 1500       Response to Exercise   Blood Pressure (Admit) 144/72    Blood Pressure (Exercise) 138/72    Blood Pressure (Exit) 130/64    Heart Rate (Admit) 97 bpm    Heart Rate (Exercise) 108 bpm    Heart Rate (Exit) 89 bpm    Oxygen Saturation (Admit) 94 %    Oxygen Saturation (Exercise) 94 %     Oxygen Saturation (Exit) 98 %    Rating of Perceived Exertion (Exercise) 13    Symptoms none    Duration Continue with 30 min of aerobic exercise without signs/symptoms of physical distress.    Intensity THRR unchanged      Progression   Progression Continue to progress workloads to maintain intensity without signs/symptoms of physical distress.    Average METs 2.27      Resistance Training   Training Prescription Yes    Weight 2 lb    Reps 10-15      Interval Training   Interval Training No      REL-XR   Level 1    Minutes 15    METs 1.9      Track   Laps 30  Minutes 15    METs 2.63      Oxygen   Maintain Oxygen Saturation 88% or higher             Nutrition:  Target Goals: Understanding of nutrition guidelines, daily intake of sodium 1500mg , cholesterol 200mg , calories 30% from fat and 7% or less from saturated fats, daily to have 5 or more servings of fruits and vegetables.  Education: All About Nutrition: -Group instruction provided by verbal, written material, interactive activities, discussions, models, and posters to present general guidelines for heart healthy nutrition including fat, fiber, MyPlate, the role of sodium in heart healthy nutrition, utilization of the nutrition label, and utilization of this knowledge for meal planning. Follow up email sent as well. Written material given at graduation.   Biometrics:  Pre Biometrics - 10/03/23 1609       Pre Biometrics   Height 5' 3.4" (1.61 m)    Weight 160 lb 9.6 oz (72.8 kg)    Waist Circumference 39.4 inches    Hip Circumference 41.5 inches    Waist to Hip Ratio 0.95 %    BMI (Calculated) 28.1    Single Leg Stand 2.88 seconds              Nutrition Therapy Plan and Nutrition Goals:  Nutrition Therapy & Goals - 11/06/23 0939       Nutrition Therapy   RD appointment deferred Yes             Nutrition Assessments:  MEDIFICTS Score Key: >=70 Need to make dietary changes  40-70  Heart Healthy Diet <= 40 Therapeutic Level Cholesterol Diet  Flowsheet Row Pulmonary Rehab from 10/03/2023 in [facility] Cardiac and Pulmonary Rehab  Picture Your Plate Total Score on Admission 60      Picture Your Plate Scores: <16 Unhealthy dietary pattern with much room for improvement. 41-50 Dietary pattern unlikely to meet recommendations for good health and room for improvement. 51-60 More healthful dietary pattern, with some room for improvement.  >60 Healthy dietary pattern, although there may be some specific behaviors that could be improved.   Nutrition Goals Re-Evaluation:  Nutrition Goals Re-Evaluation     Row Name 11/06/23 [phone]             Goals   Comment Pt has deferred to meet with the RD at this time.                Nutrition Goals Discharge (Final Nutrition Goals Re-Evaluation):  Nutrition Goals Re-Evaluation - 11/06/23 0939       Goals   Comment Pt has deferred to meet with the RD at this time.             Psychosocial: Target Goals: Acknowledge presence or absence of significant depression and/or stress, maximize coping skills, provide positive support system. Participant is able to verbalize types and ability to use techniques and skills needed for reducing stress and depression.   Education: Stress, Anxiety, and Depression - Group verbal and visual presentation to define topics covered.  Reviews how body is impacted by stress, anxiety, and depression.  Also discusses healthy ways to reduce stress and to treat/manage anxiety and depression.  Written material given at graduation.   Education: Sleep Hygiene -Provides group verbal and written instruction about how sleep can affect your health.  Define sleep hygiene, discuss sleep cycles and impact of sleep habits. Review good sleep hygiene tips.    Initial Review & Psychosocial Screening:  Initial Psych Review &  Screening - 10/01/23 1415       Initial Review   Current issues with None Identified       Family Dynamics   Good Support System? Yes   husband, grandson and grand-daughter     Screening Interventions   Interventions Encouraged to exercise    Expected Outcomes Short Term goal: Utilizing psychosocial counselor, staff and physician to assist with identification of specific Stressors or current issues interfering with healing process. Setting desired goal for each stressor or current issue identified.;Long Term Goal: Stressors or current issues are controlled or eliminated.;Short Term goal: Identification and review with participant of any Quality of Life or Depression concerns found by scoring the questionnaire.;Long Term goal: The participant improves quality of Life and PHQ9 Scores as seen by post scores and/or verbalization of changes             Quality of Life Scores:  Scores of 19 and below usually indicate a poorer quality of life in these areas.  A difference of  2-3 points is a clinically meaningful difference.  A difference of 2-3 points in the total score of the Quality of Life Index has been associated with significant improvement in overall quality of life, self-image, physical symptoms, and general health in studies assessing change in quality of life.  PHQ-9: Review Flowsheet  More data exists      12/25/2023 10/03/2023 03/30/2023 03/20/2023 01/02/2023  Depression screen PHQ 2/9  Decreased Interest 0 0 0 0 0  Down, Depressed, Hopeless 0 0 0 0 0  PHQ - 2 Score 0 0 0 0 0  Altered sleeping 0 0 0 0 0  Tired, decreased energy 0 2 0 0 0  Change in appetite 0 0 0 0 0  Feeling bad or failure about yourself  0 0 0 0 0  Trouble concentrating 0 0 0 0 0  Moving slowly or fidgety/restless 0 0 0 0 0  Suicidal thoughts 0 0 0 0 0  PHQ-9 Score 0 2 0 0 0  Difficult doing work/chores Not difficult at all Somewhat difficult Not difficult at all Not difficult at all Not difficult at all   Interpretation of Total Score  Total Score Depression Severity:  1-4 = Minimal  depression, 5-9 = Mild depression, 10-14 = Moderate depression, 15-19 = Moderately severe depression, 20-27 = Severe depression   Psychosocial Evaluation and Intervention:  Psychosocial Evaluation - 10/01/23 1430       Psychosocial Evaluation & Interventions   Interventions Encouraged to exercise with the program and follow exercise prescription    Comments Issabela is coming to pulmonary rehab for COPD. She has no barriers to attending the program. She reports no concerns with stress, depression, or anxiety. She has a good support system at home that includes her husband and also her grand-daughter and grandson that live with her. Xzaria would like to be able to do more activities without getting short of breath and hopes to reach this goal with rehab.    Expected Outcomes Short:  Attend pulmonary rehab for education and exercise  Long:  Develop and maintain positive self care habits    Continue Psychosocial Services  Follow up required by staff             Psychosocial Re-Evaluation:  Psychosocial Re-Evaluation     Row Name 11/06/23 0922             Psychosocial Re-Evaluation   Current issues with None Identified  Comments Quinnley says that she continues to have a good support system and has no mental health concerns at this moment. She also states that she is getting good sleep.       Expected Outcomes Short: Continue to manage stress levels. Long: Continue to use her support system and continue exercise to manage stressors.       Interventions Encouraged to attend Pulmonary Rehabilitation for the exercise       Continue Psychosocial Services  Follow up required by staff                Psychosocial Discharge (Final Psychosocial Re-Evaluation):  Psychosocial Re-Evaluation - 11/06/23 1610       Psychosocial Re-Evaluation   Current issues with None Identified    Comments Kieryn says that she continues to have a good support system and has no mental health concerns  at this moment. She also states that she is getting good sleep.    Expected Outcomes Short: Continue to manage stress levels. Long: Continue to use her support system and continue exercise to manage stressors.    Interventions Encouraged to attend Pulmonary Rehabilitation for the exercise    Continue Psychosocial Services  Follow up required by staff             Education: Education Goals: Education classes will be provided on a weekly basis, covering required topics. Participant will state understanding/return demonstration of topics presented.  Learning Barriers/Preferences:   General Pulmonary Education Topics:  Infection Prevention: - Provides verbal and written material to individual with discussion of infection control including proper hand washing and proper equipment cleaning during exercise session. Flowsheet Row Pulmonary Rehab from 11/22/2023 in [facility] Cardiac and Pulmonary Rehab  Date 10/03/23  Educator [facility]  Instruction Review Code 1- Verbalizes Understanding       Falls Prevention: - Provides verbal and written material to individual with discussion of falls prevention and safety. Flowsheet Row Pulmonary Rehab from 11/22/2023 in [facility] Cardiac and Pulmonary Rehab  Date 10/03/23  Educator [facility]  Instruction Review Code 1- Verbalizes Understanding       Chronic Lung Disease Review: - Group verbal instruction with posters, models, PowerPoint presentations and videos,  to review new updates, new respiratory medications, new advancements in procedures and treatments. Providing information on websites and "800" numbers for continued self-education. Includes information about supplement oxygen, available portable oxygen systems, continuous and intermittent flow rates, oxygen safety, concentrators, and Medicare reimbursement for oxygen. Explanation of Pulmonary Drugs, including class, frequency, complications, importance of spacers, rinsing mouth after steroid MDI's, and proper  cleaning methods for nebulizers. Review of basic lung anatomy and physiology related to function, structure, and complications of lung disease. Review of risk factors. Discussion about methods for diagnosing sleep apnea and types of masks and machines for OSA. Includes a review of the use of types of environmental controls: home humidity, furnaces, filters, dust mite/pet prevention, HEPA vacuums. Discussion about weather changes, air quality and the benefits of nasal washing. Instruction on Warning signs, infection symptoms, calling MD promptly, preventive modes, and value of vaccinations. Review of effective airway clearance, coughing and/or vibration techniques. Emphasizing that all should Create an Action Plan. Written material given at graduation.   AED/CPR: - Group verbal and written instruction with the use of models to demonstrate the basic use of the AED with the basic ABC's of resuscitation.    Anatomy and Cardiac Procedures: - Group verbal and visual presentation and models provide information about basic cardiac anatomy and function. Reviews  the testing methods done to diagnose heart disease and the outcomes of the test results. Describes the treatment choices: Medical Management, Angioplasty, or Coronary Bypass Surgery for treating various heart conditions including Myocardial Infarction, Angina, Valve Disease, and Cardiac Arrhythmias.  Written material given at graduation.   Medication Safety: - Group verbal and visual instruction to review commonly prescribed medications for heart and lung disease. Reviews the medication, class of the drug, and side effects. Includes the steps to properly store meds and maintain the prescription regimen.  Written material given at graduation.   Other: -Provides group and verbal instruction on various topics (see comments)   Knowledge Questionnaire Score:  Knowledge Questionnaire Score - 10/11/23 1040       Knowledge Questionnaire Score   Pre  Score 10/18              Core Components/Risk Factors/Patient Goals at Admission:  Personal Goals and Risk Factors at Admission - 10/01/23 1419       Core Components/Risk Factors/Patient Goals on Admission    Weight Management Yes    Intervention Weight Management: Develop a combined nutrition and exercise program designed to reach desired caloric intake, while maintaining appropriate intake of nutrient and fiber, sodium and fats, and appropriate energy expenditure required for the weight goal.;Weight Management: Provide education and appropriate resources to help participant work on and attain dietary goals.;Weight Management/Obesity: Establish reasonable short term and long term weight goals.    Admit Weight 157 lb (71.2 kg)    Goal Weight: Short Term 152 lb (68.9 kg)    Goal Weight: Long Term 147 lb (66.7 kg)    Expected Outcomes Short Term: Continue to assess and modify interventions until short term weight is achieved;Long Term: Adherence to nutrition and physical activity/exercise program aimed toward attainment of established weight goal;Weight Maintenance: Understanding of the daily nutrition guidelines, which includes 25-35% calories from fat, 7% or less cal from saturated fats, less than 200mg  cholesterol, less than 1.5gm of sodium, & 5 or more servings of fruits and vegetables daily;Weight Loss: Understanding of general recommendations for a balanced deficit meal plan, which promotes 1-2 lb weight loss per week and includes a negative energy balance of [id number] kcal/d;Understanding recommendations for meals to include 15-35% energy as protein, 25-35% energy from fat, 35-60% energy from carbohydrates, less than 200mg  of dietary cholesterol, 20-35 gm of total fiber daily;Understanding of distribution of calorie intake throughout the day with the consumption of 4-5 meals/snacks    Improve shortness of breath with ADL's Yes    Intervention Provide education, individualized exercise plan  and daily activity instruction to help decrease symptoms of SOB with activities of daily living.    Expected Outcomes Short Term: Improve cardiorespiratory fitness to achieve a reduction of symptoms when performing ADLs;Long Term: Be able to perform more ADLs without symptoms or delay the onset of symptoms    Increase knowledge of respiratory medications and ability to use respiratory devices properly  Yes    Intervention Provide education and demonstration as needed of appropriate use of medications, inhalers, and oxygen therapy.    Expected Outcomes Short Term: Achieves understanding of medications use. Understands that oxygen is a medication prescribed by physician. Demonstrates appropriate use of inhaler and oxygen therapy.;Long Term: Maintain appropriate use of medications, inhalers, and oxygen therapy.    Hypertension Yes    Intervention Monitor prescription use compliance.;Provide education on lifestyle modifcations including regular physical activity/exercise, weight management, moderate sodium restriction and increased consumption of fresh fruit, vegetables, and low  fat dairy, alcohol moderation, and smoking cessation.    Expected Outcomes Short Term: Continued assessment and intervention until BP is < 140/22mm HG in hypertensive participants. < 130/40mm HG in hypertensive participants with diabetes, heart failure or chronic kidney disease.;Long Term: Maintenance of blood pressure at goal levels.             Education:Diabetes - Individual verbal and written instruction to review signs/symptoms of diabetes, desired ranges of glucose level fasting, after meals and with exercise. Acknowledge that pre and post exercise glucose checks will be done for 3 sessions at entry of program.   Know Your Numbers and Heart Failure: - Group verbal and visual instruction to discuss disease risk factors for cardiac and pulmonary disease and treatment options.  Reviews associated critical values for  Overweight/Obesity, Hypertension, Cholesterol, and Diabetes.  Discusses basics of heart failure: signs/symptoms and treatments.  Introduces Heart Failure Zone chart for action plan for heart failure.  Written material given at graduation. Flowsheet Row Pulmonary Rehab from 11/22/2023 in [facility] Cardiac and Pulmonary Rehab  Date 11/22/23  Educator [facility]  Instruction Review Code 1- Verbalizes Understanding       Core Components/Risk Factors/Patient Goals Review:   Goals and Risk Factor Review     Row Name 11/06/23 0927             Core Components/Risk Factors/Patient Goals Review   Personal Goals Review Weight Management/Obesity;Hypertension       Review Saoirse continues to take her blood pressure at home at least once a day. Emry states that she wants to lose around 30 lbs, and continues to manage her diet in order to hit that goal.       Expected Outcomes Shot: Continue to take blood pressure at home to manage hypertension. Long: Continue exercising to manage weight loss goals.                Core Components/Risk Factors/Patient Goals at Discharge (Final Review):   Goals and Risk Factor Review - 11/06/23 0927       Core Components/Risk Factors/Patient Goals Review   Personal Goals Review Weight Management/Obesity;Hypertension    Review Junette continues to take her blood pressure at home at least once a day. Kamoni states that she wants to lose around 30 lbs, and continues to manage her diet in order to hit that goal.    Expected Outcomes Shot: Continue to take blood pressure at home to manage hypertension. Long: Continue exercising to manage weight loss goals.             ITP Comments:  ITP Comments     Row Name 10/01/23 1427 10/03/23 1559 10/11/23 0933 10/17/23 1239 11/07/23 1135   ITP Comments Initial phone call completed. Dx can be found in [facility] 9/9. EP orientation scheduled for 10/16 at 2:30 PM. Completed and gym orientation. Initial ITP created and sent for  review to Dr. Jinny Sanders, Medical Director. First full day of exercise!  Patient was oriented to gym and equipment including functions, settings, policies, and procedures.  Patient's individual exercise prescription and treatment plan were reviewed.  All starting workloads were established based on the results of the 6 minute walk test done at initial orientation visit.  The plan for exercise progression was also introduced and progression will be customized based on patient's performance and goals. 30 Day review completed. Medical Director ITP review done, changes made as directed, and signed approval by Medical Director.    new to program 30 Day  review completed. Medical Director ITP review done, changes made as directed, and signed approval by Medical Director.    Row Name 12/05/23 0955 12/31/23 1115 01/02/24 1301 01/14/24 1642     ITP Comments 30 Day review completed. Medical Director ITP review done, changes made as directed, and signed approval by Medical Director. Called to check on patient. She has not attended since 12/10. Left message for patient to call us back. 30 Day review completed. Medical Director ITP review done, changes made as directed, and signed approval by Medical Director.   remains out since 12/10 Discharging patient due to lack of attendance. She completed 11/36 sessions.             Comments: Early Discharge ITP

## 2024-01-23 DIAGNOSIS — H4010X0 Unspecified open-angle glaucoma, stage unspecified: Secondary | ICD-10-CM | POA: Diagnosis not present

## 2024-02-18 DIAGNOSIS — J4520 Mild intermittent asthma, uncomplicated: Secondary | ICD-10-CM | POA: Diagnosis not present

## 2024-02-18 DIAGNOSIS — J849 Interstitial pulmonary disease, unspecified: Secondary | ICD-10-CM | POA: Diagnosis not present

## 2024-02-18 DIAGNOSIS — R0609 Other forms of dyspnea: Secondary | ICD-10-CM | POA: Diagnosis not present

## 2024-03-24 ENCOUNTER — Ambulatory Visit: Admitting: Emergency Medicine

## 2024-03-24 VITALS — BP 132/82 | Ht 62.5 in | Wt 153.2 lb

## 2024-03-24 DIAGNOSIS — Z0000 Encounter for general adult medical examination without abnormal findings: Secondary | ICD-10-CM

## 2024-03-24 DIAGNOSIS — Z1231 Encounter for screening mammogram for malignant neoplasm of breast: Secondary | ICD-10-CM | POA: Diagnosis not present

## 2024-03-24 NOTE — Patient Instructions (Addendum)
 Nancy Mack , Thank you for taking time to come for your Medicare Wellness Visit. I appreciate your ongoing commitment to your health goals. Please review the following plan we discussed and let me know if I can assist you in the future.   Referrals/Orders/Follow-Ups/Clinician Recommendations: I have placed an order for mammogram. Call [facility] @ [phone] to schedule at your earliest convenience.  This is a list of the screening recommended for you and due dates:  Health Maintenance  Topic Date Due   Mammogram  09/22/2023   COVID-19 Vaccine (8 - Mixed Product risk 2024-25 season) 03/10/2024   Flu Shot  07/18/2024   Medicare Annual Wellness Visit  03/24/2025   DEXA scan (bone density measurement)  09/22/2027   DTaP/Tdap/Td vaccine (6 - Td or Tdap) 05/03/2030   Pneumonia Vaccine  Completed   Hepatitis C Screening  Completed   Zoster (Shingles) Vaccine  Completed   HPV Vaccine  Aged Out   Colon Cancer Screening  Discontinued    Advanced directives: (Copy Requested) Please bring a copy of your health care power of attorney and living will to the office to be added to your chart at your convenience. You can mail to [facility] [address] [place], [place] [address] or email to [email or web address]  Next Medicare Annual Wellness Visit scheduled for next year: Yes, 04/07/25 @ 8:00am (in person)

## 2024-03-24 NOTE — Progress Notes (Signed)
 Subjective:   Nancy Mack is a 78 y.o. who presents for a Medicare Wellness preventive visit.  Visit Complete: In person   Persons Participating in Visit: Patient.  AWV Questionnaire: No: Patient Medicare AWV questionnaire was not completed prior to this visit.  Cardiac Risk Factors include: advanced age (>40men, >31 women);dyslipidemia;hypertension     Objective:    Today's Vitals   03/24/24 0855  BP: 132/82  Weight: 153 lb 3.2 oz (69.5 kg)  Height: 5' 2.5" (1.588 m)   Body mass index is 27.57 kg/m.     03/24/2024    9:13 AM 10/01/2023    2:13 PM 03/20/2023    9:04 AM 03/20/2023    8:57 AM 03/06/2022    8:25 AM 02/22/2022   10:46 AM 02/10/2022    9:32 AM  Advanced Directives  Does Patient Have a Medical Advance Directive? No No No No No No No  Would patient like information on creating a medical advance directive? No - Patient declined Yes (MAU/Ambulatory/Procedural Areas - Information given) No - Patient declined No - Patient declined No - Patient declined No - Patient declined     Current Medications (verified) Outpatient Encounter Medications as of 03/24/2024  Medication Sig   acetaminophen (TYLENOL) 500 MG tablet Take 1,000 mg by mouth every 6 (six) hours as needed.   albuterol (VENTOLIN HFA) 108 (90 Base) MCG/ACT inhaler Inhale 2 puffs into the lungs every 6 (six) hours as needed.   Ascorbic Acid (VITAMIN C) 100 MG tablet Take 100 mg by mouth daily.   aspirin EC 81 MG tablet Take 81 mg by mouth daily. Swallow whole.   atorvastatin (LIPITOR) 80 MG tablet Take 1 tablet (80 mg total) by mouth daily.   azelastine (ASTELIN) 0.1 % nasal spray Place 1 spray 2 (two) times daily into both nostrils. Use in each nostril as directed   Cholecalciferol (VITAMIN D) 50 MCG (2000 UT) tablet Take 2,000 Units by mouth daily.   fluticasone (FLONASE) 50 MCG/ACT nasal spray Place 1 spray into both nostrils in the morning and at bedtime.   Fluticasone-Umeclidin-Vilant (TRELEGY ELLIPTA)  100-62.5-25 MCG/ACT AEPB Inhale 1 puff into the lungs daily.   latanoprost (XALATAN) 0.005 % ophthalmic solution SMARTSIG:In Eye(s)   montelukast (SINGULAIR) 10 MG tablet Take 1 tablet (10 mg total) by mouth at bedtime.   triamcinolone cream (KENALOG) 0.1 % Apply 1 application topically 2 (two) times daily. (Patient taking differently: Apply 1 application  topically 2 (two) times daily as needed (irritation).)   Biotin 1000 MCG tablet Take 1,000 mcg by mouth daily. (Patient not taking: Reported on 03/24/2024)   No facility-administered encounter medications on file as of 03/24/2024.    Allergies (verified) Azithromycin, Effexor [venlafaxine], Lactose intolerance (gi), Prednisone, and Tape   History: Past Medical History:  Diagnosis Date   Allergy    Anemia    Arthritis    Asthma    Carpal tunnel syndrome    Chronic kidney disease    Dyspnea    Eczema    Globus pharyngeus    Hyperlipidemia    Hypertension    Laryngopharyngeal reflux    Menopause    Neuroma    right foot   Osteoporosis    Pneumonia    Raynaud's disease    Rhinitis due to pollen    SOB (shortness of breath)    Past Surgical History:  Procedure Laterality Date   COLONOSCOPY WITH PROPOFOL N/A 03/04/2019   Procedure: COLONOSCOPY WITH PROPOFOL;  Surgeon: Servando Snare,  Darren, MD;  Location: [facility];  Service: Endoscopy;  Laterality: N/A;   EYE SURGERY Left 2015   broken blood veesel, Dr.Matthews    EYE SURGERY Bilateral 2017   In     FOOT NEUROMA SURGERY Right    PARTIAL HYSTERECTOMY     SHOULDER ARTHROSCOPY WITH BICEPSTENOTOMY Right 04/08/2018   Procedure: SHOULDER ARTHROSCOPY WITH BICEPSTENOTOMY;  Surgeon: Lyndle Herrlich, MD;  Location: [facility] OR[facility];  Service: Orthopedics;  Laterality: Right;   TOTAL SHOULDER ARTHROPLASTY Left 02/22/2022   Procedure: TOTAL SHOULDER ARTHROPLASTY;  Surgeon: Lyndle Herrlich, MD;  Location: [facility] OR[facility];  Service: Orthopedics;  Laterality: Left;   Family History  Problem Relation  Age of Onset   Arthritis Mother    Heart failure Mother    Hypertension Mother    Hyperlipidemia Mother    Heart disease Mother    Dementia Mother    Asthma Father    Aneurysm Sister    Lung cancer Son    Liver cancer Son    Alzheimer's disease Maternal Grandmother    Social History   Socioeconomic History   Marital status: Married    Spouse name: Peyton Najjar   Number of children: 1   Years of education: Not on file   Highest education level: Bachelor's degree (e.g., BA, AB, BS)  Occupational History   Occupation: retired  Tobacco Use   Smoking status: Former    Current packs/day: 0.00    Average packs/day: 2.0 packs/day for 2.0 years (4.0 ttl pk-yrs)    Types: Cigarettes    Start date: 12/18/1976    Quit date: 12/18/1978    Years since quitting: 45.2   Smokeless tobacco: Never   Tobacco comments:    quit in 1980  Vaping Use   Vaping status: Never Used  Substance and Sexual Activity   Alcohol use: Not Currently    Alcohol/week: 0.0 standard drinks of alcohol   Drug use: No   Sexual activity: Yes  Other Topics Concern   Not on file  Social History Narrative   Attends church, 1 son deceased   Social Drivers of Corporate investment banker Strain: Low Risk  (03/24/2024)   Overall Financial Resource Strain (CARDIA)    Difficulty of Paying Living Expenses: Not hard at all  Food Insecurity: No Food Insecurity (03/24/2024)   Hunger Vital Sign    Worried About Running Out of Food in the Last Year: Never true    Ran Out of Food in the Last Year: Never true  Transportation Needs: No Transportation Needs (03/24/2024)   PRAPARE - Administrator, Civil Service (Medical): No    Lack of Transportation (Non-Medical): No  Physical Activity: Inactive (03/24/2024)   Exercise Vital Sign    Days of Exercise per Week: 0 days    Minutes of Exercise per Session: 0 min  Stress: No Stress Concern Present (03/24/2024)   [employer] of Occupational Health - Occupational Stress  Questionnaire    Feeling of Stress : Not at all  Social Connections: Socially Integrated (03/24/2024)   Social Connection and Isolation Panel [NHANES]    Frequency of Communication with Friends and Family: More than three times a week    Frequency of Social Gatherings with Friends and Family: More than three times a week    Attends Religious Services: More than 4 times per year    Active Member of [employer] or Organizations: Yes    Attends Banker Meetings: More than 4 times per  year    Marital Status: Married    Tobacco Counseling Counseling given: Not Answered Tobacco comments: quit in 1980    Clinical Intake:  Pre-visit preparation completed: Yes  Pain : No/denies pain     BMI - recorded: 27.57 Nutritional Status: BMI 25 -29 Overweight Nutritional Risks: None Diabetes: No  No results found for: "HGBA1C"   How often do you need to have someone help you when you read instructions, pamphlets, or other written materials from your doctor or pharmacy?: 1 - Never  Interpreter Needed?: No  Information entered by :: Tora Kindred, CMA   Activities of Daily Living       03/24/2024    8:59 AM  In your present state of health, do you have any difficulty performing the following activities:  Hearing? 0  Vision? 0  Difficulty concentrating or making decisions? 0  Walking or climbing stairs? 0  Dressing or bathing? 0  Doing errands, shopping? 0  Preparing Food and eating ? N  Using the Toilet? N  In the past six months, have you accidently leaked urine? N  Do you have problems with loss of bowel control? N  Managing your Medications? N  Managing your Finances? N  Housekeeping or managing your Housekeeping? N    Patient Care Team: Dorcas Carrow, DO as PCP - General (Family Medicine) Geanie Logan, MD as Referring Physician (Otolaryngology) Thereasa Solo (Optometry) Vida Rigger, MD as Consulting Physician (Pulmonary Disease) Germaine Pomfret, Eliane Decree, NP as Nurse Practitioner (Cardiology) Deirdre Evener, MD (Dermatology)  Indicate any recent Medical Services you may have received from other than [facility] providers in the past year (date may be approximate).     Assessment:   This is a routine wellness examination for Jaslynn.  Hearing/Vision screen Hearing Screening - Comments:: Denies hearing loss Vision Screening - Comments:: Gets eye exams, Dr. Thereasa Solo  [place]   Goals Addressed             This Visit's Progress    Exercise 150 min/wk Moderate Activity         Depression Screen     03/24/2024    9:09 AM 12/25/2023   10:26 AM 10/03/2023    4:12 PM 03/30/2023    8:52 AM 03/20/2023    9:02 AM 01/02/2023    2:54 PM 06/13/2022    8:30 AM  PHQ 2/9 Scores  PHQ - 2 Score 0 0 0 0 0 0 0  PHQ- 9 Score 0 0 2 0 0 0 0    Fall Risk     03/24/2024    9:15 AM 12/25/2023   10:25 AM 10/01/2023    2:12 PM 03/30/2023    8:52 AM 03/20/2023    8:59 AM  Fall Risk   Falls in the past year? 0 0 0 0 0  Number falls in past yr: 0 0 0 0 0  Injury with Fall? 0 0 0 0 0  Risk for fall due to : No Fall Risks No Fall Risks  No Fall Risks No Fall Risks  Follow up Falls prevention discussed;Falls evaluation completed Falls evaluation completed  Falls evaluation completed Falls prevention discussed;Falls evaluation completed    MEDICARE RISK AT HOME:  Medicare Risk at Home Any stairs in or around the home?: No If so, are there any without handrails?: No Home free of loose throw rugs in walkways, pet beds, electrical cords, etc?: Yes Adequate lighting in your home to reduce risk of falls?:  Yes Life alert?: No Use of a cane, walker or w/c?: No Grab bars in the bathroom?: No Shower chair or bench in shower?: Yes Elevated toilet seat or a handicapped toilet?: Yes  TIMED UP AND GO:  Was the test performed?  Yes  Length of time to ambulate 10 feet: 7 sec Gait steady and fast without use of assistive device  Cognitive  Function: 6CIT completed        03/24/2024    9:16 AM 03/20/2023    9:14 AM 03/04/2021    8:21 AM 05/03/2020    8:37 AM 05/03/2020    8:36 AM  6CIT Screen  What Year? 0 points 0 points 0 points 0 points 0 points  What month? 0 points 0 points 0 points 0 points 0 points  What time? 0 points 0 points 0 points 0 points 0 points  Count back from 20 0 points 0 points 0 points 0 points   Months in reverse 0 points 0 points 0 points 0 points   Repeat phrase 0 points 0 points 0 points 2 points   Total Score 0 points 0 points 0 points 2 points     Immunizations Immunization History  Administered Date(s) Administered   Fluad Quad(high Dose 65+) 09/26/2019, 09/14/2020, 09/05/2021, 09/18/2022   Fluad Trivalent(High Dose 65+) 09/28/2023   Influenza, High Dose Seasonal PF 09/21/2016, 10/02/2017, 10/23/2018   Influenza,inj,Quad PF,6+ Mos 10/01/2015   Influenza-Unspecified 09/10/2012, 09/15/2014, 09/21/2016   Moderna Covid-19 Fall Seasonal Vaccine 49yrs & older 10/16/2022   Moderna Sars-Covid-2 Vaccination 01/26/2020, 02/25/2020, 09/11/2023   PFIZER Comirnaty(Gray Top)Covid-19 Tri-Sucrose Vaccine 06/08/2021   PFIZER(Purple Top)SARS-COV-2 Vaccination 11/09/2020   Pfizer Covid-19 Vaccine Bivalent Booster 47yrs & up 10/04/2021   Pfizer(Comirnaty)Fall Seasonal Vaccine 12 years and older 10/16/2022   Pneumococcal Conjugate-13 01/22/2015   Pneumococcal Polysaccharide-23 06/05/2012   Pneumococcal-Unspecified 10/19/2014   Td 08/26/2009, 05/03/2020   Td (Adult), 2 Lf Tetanus Toxid, Preservative Free 08/26/2009, 05/03/2020   Tdap 02/20/2011   Unspecified SARS-COV-2 Vaccination 09/11/2023   Zoster Recombinant(Shingrix) 08/12/2019, 11/12/2019   Zoster, Live 03/13/2007    Screening Tests Health Maintenance  Topic Date Due   MAMMOGRAM  09/22/2023   COVID-19 Vaccine (8 - Mixed Product risk 2024-25 season) 03/10/2024   INFLUENZA VACCINE  07/18/2024   Medicare Annual Wellness (AWV)  03/24/2025   DEXA  SCAN  09/22/2027   DTaP/Tdap/Td (6 - Td or Tdap) 05/03/2030   Pneumonia Vaccine 15+ Years old  Completed   Hepatitis C Screening  Completed   Zoster Vaccines- Shingrix  Completed   HPV VACCINES  Aged Out   Colonoscopy  Discontinued    Health Maintenance  Health Maintenance Due  Topic Date Due   MAMMOGRAM  09/22/2023   COVID-19 Vaccine (8 - Mixed Product risk 2024-25 season) 03/10/2024   Health Maintenance Items Addressed: Mammogram ordered, See Nurse Notes  Additional Screening:  Vision Screening: Recommended annual ophthalmology exams for early detection of glaucoma and other disorders of the eye.  Dental Screening: Recommended annual dental exams for proper oral hygiene  Community Resource Referral / Chronic Care Management: CRR required this visit?  No   CCM required this visit?  No     Plan:     I have personally reviewed and noted the following in the patient's chart:   Medical and social history Use of alcohol, tobacco or illicit drugs  Current medications and supplements including opioid prescriptions. Patient is not currently taking opioid prescriptions. Functional ability and status Nutritional status Physical activity  Advanced directives List of other physicians Hospitalizations, surgeries, and ER visits in previous 12 months Vitals Screenings to include cognitive, depression, and falls Referrals and appointments  In addition, I have reviewed and discussed with patient certain preventive protocols, quality metrics, and best practice recommendations. A written personalized care plan for preventive services as well as general preventive health recommendations were provided to patient.     Tora Kindred, CMA   03/24/2024   After Visit Summary: (In Person-Printed) AVS printed and given to the patient  Notes:  Placed order for MMG to be done at [facility]

## 2024-03-31 ENCOUNTER — Ambulatory Visit: Admitting: Family Medicine

## 2024-03-31 ENCOUNTER — Encounter: Payer: Self-pay | Admitting: Family Medicine

## 2024-03-31 VITALS — BP 132/77 | HR 89 | Temp 98.7°F | Resp 16 | Ht 62.52 in | Wt 153.0 lb

## 2024-03-31 DIAGNOSIS — J069 Acute upper respiratory infection, unspecified: Secondary | ICD-10-CM | POA: Diagnosis not present

## 2024-03-31 MED ORDER — PREDNISOLONE 5 MG PO TABS: 40.0000 mg | ORAL_TABLET | Freq: Every day | ORAL | 0 refills | Status: DC

## 2024-03-31 MED ORDER — METHYLPREDNISOLONE 4 MG PO TBPK: ORAL_TABLET | ORAL | 0 refills | Status: DC

## 2024-03-31 NOTE — Progress Notes (Signed)
 BP 132/77 (BP Location: Left Arm, Patient Position: Sitting, Cuff Size: Normal)   Pulse 89   Temp 98.7 F (37.1 C) (Oral)   Resp 16   Ht 5' 2.52" (1.588 m)   Wt 153 lb (69.4 kg)   LMP 07/31/1981 (Approximate)   SpO2 97%   BMI 27.52 kg/m    Subjective:    Patient ID: Nancy Mack, female    DOB: 1945-12-29, 78 y.o.   MRN: [id number]  HPI: Nancy Mack is a 78 y.o. female  Chief Complaint  Patient presents with   URI   UPPER RESPIRATORY TRACT INFECTION Duration: 5 days Worst symptom: congestion Fever: no Cough: yes Shortness of breath: yes Wheezing: yes Chest pain: no Chest tightness: yes Chest congestion: yes Nasal congestion: yes Runny nose: yes Post nasal drip: yes Sneezing: no Sore throat: yes Swollen glands: no Sinus pressure: yes Headache: no Face pain: no Toothache: no Ear pain: no  Ear pressure: no  Eyes red/itching:no Eye drainage/crusting: no  Vomiting: no Rash: no Fatigue: yes Sick contacts: yes Strep contacts: no  Context: better Recurrent sinusitis: no Relief with OTC cold/cough medications: no  Treatments attempted: nyquil, home remedies    Relevant past medical, surgical, family and social history reviewed and updated as indicated. Interim medical history since our last visit reviewed. Allergies and medications reviewed and updated.  Review of Systems  Constitutional: Negative.   HENT:  Positive for congestion, postnasal drip, sinus pressure and voice change. Negative for dental problem, drooling, ear discharge, ear pain, facial swelling, hearing loss, mouth sores, nosebleeds, rhinorrhea, sinus pain, sneezing, sore throat, tinnitus and trouble swallowing.   Eyes: Negative.   Respiratory: Negative.    Cardiovascular: Negative.   Gastrointestinal: Negative.   Musculoskeletal: Negative.   Psychiatric/Behavioral: Negative.      Per HPI unless specifically indicated above     Objective:    BP 132/77 (BP Location: Left  Arm, Patient Position: Sitting, Cuff Size: Normal)   Pulse 89   Temp 98.7 F (37.1 C) (Oral)   Resp 16   Ht 5' 2.52" (1.588 m)   Wt 153 lb (69.4 kg)   LMP 07/31/1981 (Approximate)   SpO2 97%   BMI 27.52 kg/m   Wt Readings from Last 3 Encounters:  03/31/24 153 lb (69.4 kg)  03/24/24 153 lb 3.2 oz (69.5 kg)  12/25/23 155 lb (70.3 kg)    Physical Exam Vitals and nursing note reviewed.  Constitutional:      General: She is not in acute distress.    Appearance: Normal appearance. She is not ill-appearing, toxic-appearing or diaphoretic.  HENT:     Head: Normocephalic and atraumatic.     Right Ear: External ear normal. There is impacted cerumen.     Left Ear: Tympanic membrane, ear canal and external ear normal. There is no impacted cerumen.     Nose: Nose normal. No congestion or rhinorrhea.     Mouth/Throat:     Mouth: Mucous membranes are moist.     Pharynx: Oropharynx is clear. No oropharyngeal exudate or posterior oropharyngeal erythema.  Eyes:     General: No scleral icterus.       Right eye: No discharge.        Left eye: No discharge.     Extraocular Movements: Extraocular movements intact.     Conjunctiva/sclera: Conjunctivae normal.     Pupils: Pupils are equal, round, and reactive to light.  Cardiovascular:     Rate and Rhythm: Normal rate  and regular rhythm.     Pulses: Normal pulses.     Heart sounds: Normal heart sounds. No murmur heard.    No friction rub. No gallop.  Pulmonary:     Effort: Pulmonary effort is normal. No respiratory distress.     Breath sounds: Normal breath sounds. No stridor. No wheezing, rhonchi or rales.  Chest:     Chest wall: No tenderness.  Musculoskeletal:        General: Normal range of motion.     Cervical back: Normal range of motion and neck supple.  Skin:    General: Skin is warm and dry.     Capillary Refill: Capillary refill takes less than 2 seconds.     Coloration: Skin is not jaundiced or pale.     Findings: No bruising,  erythema, lesion or rash.  Neurological:     General: No focal deficit present.     Mental Status: She is alert and oriented to person, place, and time. Mental status is at baseline.  Psychiatric:        Mood and Affect: Mood normal.        Behavior: Behavior normal.        Thought Content: Thought content normal.        Judgment: Judgment normal.     Results for orders placed or performed in visit on 12/25/23  Microalbumin, Urine Waived   Collection Time: 12/25/23 10:48 AM  Result Value Ref Range   Microalb, Ur Waived 30 (H) 0 - 19 mg/L   Creatinine, Urine Waived 200 10 - 300 mg/dL   Microalb/Creat Ratio <30 <30 mg/g      Assessment & Plan:   Problem List Items Addressed This Visit   None Visit Diagnoses       Upper respiratory tract infection, unspecified type    -  Primary   Will treat with medrol dose pack. Call if not getting better or getting worse.        Follow up plan: Return for As scheduled.

## 2024-04-16 DIAGNOSIS — Z1231 Encounter for screening mammogram for malignant neoplasm of breast: Secondary | ICD-10-CM | POA: Diagnosis not present

## 2024-04-16 LAB — HM MAMMOGRAPHY

## 2024-05-14 ENCOUNTER — Encounter: Payer: Self-pay | Admitting: Family Medicine

## 2024-05-14 ENCOUNTER — Ambulatory Visit (INDEPENDENT_AMBULATORY_CARE_PROVIDER_SITE_OTHER): Payer: Self-pay | Admitting: Family Medicine

## 2024-05-14 VITALS — BP 126/68 | HR 82 | Ht 62.5 in | Wt 153.2 lb

## 2024-05-14 DIAGNOSIS — I10 Essential (primary) hypertension: Secondary | ICD-10-CM | POA: Diagnosis not present

## 2024-05-14 DIAGNOSIS — E559 Vitamin D deficiency, unspecified: Secondary | ICD-10-CM

## 2024-05-14 DIAGNOSIS — E782 Mixed hyperlipidemia: Secondary | ICD-10-CM | POA: Diagnosis not present

## 2024-05-14 DIAGNOSIS — Z711 Person with feared health complaint in whom no diagnosis is made: Secondary | ICD-10-CM | POA: Diagnosis not present

## 2024-05-14 DIAGNOSIS — K219 Gastro-esophageal reflux disease without esophagitis: Secondary | ICD-10-CM

## 2024-05-14 DIAGNOSIS — Z0000 Encounter for general adult medical examination without abnormal findings: Secondary | ICD-10-CM

## 2024-05-14 LAB — MICROALBUMIN, URINE WAIVED
Creatinine, Urine Waived: 100 mg/dL (ref 10–300)
Microalb, Ur Waived: 30 mg/L — ABNORMAL HIGH (ref 0–19)
Microalb/Creat Ratio: <30 mg/g (ref <30)

## 2024-05-14 MED ORDER — MONTELUKAST SODIUM 10 MG PO TABS: 10.0000 mg | ORAL_TABLET | Freq: Every evening | ORAL | 1 refills | Status: DC

## 2024-05-14 MED ORDER — TRELEGY ELLIPTA 100-62.5-25 MCG/ACT IN AEPB: 1.0000 | INHALATION_SPRAY | Freq: Every day | RESPIRATORY_TRACT | 4 refills | Status: AC

## 2024-05-14 MED ORDER — ALBUTEROL SULFATE HFA 108 (90 BASE) MCG/ACT IN AERS: 2.0000 | INHALATION_SPRAY | Freq: Four times a day (QID) | RESPIRATORY_TRACT | 12 refills | Status: AC | PRN

## 2024-05-14 MED ORDER — ATORVASTATIN CALCIUM 80 MG PO TABS: 80.0000 mg | ORAL_TABLET | Freq: Every day | ORAL | 1 refills | Status: DC

## 2024-05-14 MED ORDER — LISINOPRIL 2.5 MG PO TABS: 2.5000 mg | ORAL_TABLET | Freq: Every day | ORAL | 3 refills | Status: DC

## 2024-05-14 MED ORDER — AZELASTINE HCL 0.1 % NA SOLN: 2.0000 | Freq: Two times a day (BID) | NASAL | 1 refills | Status: DC

## 2024-05-14 NOTE — Assessment & Plan Note (Signed)
 Under good control on current regimen. Continue current regimen. Continue to monitor. Call with any concerns. Refills given.

## 2024-05-14 NOTE — Assessment & Plan Note (Signed)
 Under good control on current regimen. Continue current regimen. Continue to monitor. Call with any concerns. Refills given. Labs drawn today.

## 2024-05-14 NOTE — Progress Notes (Signed)
 BP 126/68   Pulse 82   Ht 5' 2.5" (1.588 m)   Wt 153 lb 3.2 oz (69.5 kg)   LMP 07/31/1981 (Approximate)   SpO2 95%   BMI 27.57 kg/m    Subjective:    Patient ID: Nancy Mack, female    DOB: 1946/07/23, 78 y.o.   MRN: [id number]  HPI: Nancy Mack is a 78 y.o. female presenting on 05/14/2024 for comprehensive medical examination. Current medical complaints include:  Has been having issues with losing her train of thought when she's speaking. She has not forgotten anything else but it's making her a bit anxious because her Mom had dementia in her 1s.  HYPERTENSION / HYPERLIPIDEMIA Satisfied with current treatment? yes Duration of hypertension: chronic BP monitoring frequency: weekly- 130s BP medication side effects: no Past BP meds: HCTZ Duration of hyperlipidemia: chronic Cholesterol medication side effects: no Cholesterol supplements: none Past cholesterol medications: atorvastatin  Medication compliance: excellent compliance Aspirin : yes Recent stressors: yes Recurrent headaches: no Visual changes: no Palpitations: no Dyspnea: no Chest pain: no Lower extremity edema: no Dizzy/lightheaded: no  Menopausal Symptoms: no  Depression Screen done today and results listed below:     05/14/2024    8:17 AM 03/24/2024    9:09 AM 12/25/2023   10:26 AM 10/03/2023    4:12 PM 03/30/2023    8:52 AM  Depression screen PHQ 2/9  Decreased Interest 0 0 0 0 0  Down, Depressed, Hopeless 0 0 0 0 0  PHQ - 2 Score 0 0 0 0 0  Altered sleeping 0 0 0 0 0  Tired, decreased energy 0 0 0 2 0  Change in appetite 0 0 0 0 0  Feeling bad or failure about yourself  0 0 0 0 0  Trouble concentrating 0 0 0 0 0  Moving slowly or fidgety/restless 0 0 0 0 0  Suicidal thoughts 0 0 0 0 0  PHQ-9 Score 0 0 0 2 0  Difficult doing work/chores Not difficult at all Not difficult at all Not difficult at all Somewhat difficult Not difficult at all    Past Medical History:  Past Medical  History:  Diagnosis Date   Allergy    Anemia    Arthritis    Asthma    Carpal tunnel syndrome    Chronic kidney disease    Dyspnea    Eczema    Globus pharyngeus    Hyperlipidemia    Hypertension    Laryngopharyngeal reflux    Menopause    Neuroma    right foot   Osteoporosis    Pneumonia    Raynaud's disease    Rhinitis due to pollen    SOB (shortness of breath)     Surgical History:  Past Surgical History:  Procedure Laterality Date   COLONOSCOPY WITH PROPOFOL  N/A 03/04/2019   Procedure: COLONOSCOPY WITH PROPOFOL ;  Surgeon: Marnee Sink, MD;  Location: [facility];  Service: Endoscopy;  Laterality: N/A;   EYE SURGERY Left 2015   broken blood veesel, Dr.Matthews    EYE SURGERY Bilateral 2017   In [place]    FOOT NEUROMA SURGERY Right    PARTIAL HYSTERECTOMY     SHOULDER ARTHROSCOPY WITH BICEPSTENOTOMY Right 04/08/2018   Procedure: SHOULDER ARTHROSCOPY WITH BICEPSTENOTOMY;  Surgeon: Jerlyn Moons, MD;  Location: [facility];  Service: Orthopedics;  Laterality: Right;   TOTAL SHOULDER ARTHROPLASTY Left 02/22/2022   Procedure: TOTAL SHOULDER ARTHROPLASTY;  Surgeon: Jerlyn Moons, MD;  Location: [facility] OR[facility];  Service: Orthopedics;  Laterality: Left;    Medications:  Current Outpatient Medications on File Prior to Visit  Medication Sig   acetaminophen  (TYLENOL ) 500 MG tablet Take 1,000 mg by mouth every 6 (six) hours as needed.   Ascorbic Acid (VITAMIN C) 100 MG tablet Take 100 mg by mouth daily.   aspirin  EC 81 MG tablet Take 81 mg by mouth daily. Swallow whole.   Cholecalciferol (VITAMIN D ) 50 MCG (2000 UT) tablet Take 2,000 Units by mouth daily.   fluticasone  (FLONASE ) 50 MCG/ACT nasal spray Place 1 spray into both nostrils in the morning and at bedtime.   latanoprost (XALATAN) 0.005 % ophthalmic solution SMARTSIG:In Eye(s)   triamcinolone  cream (KENALOG ) 0.1 % Apply 1 application topically 2 (two) times daily. (Patient taking differently: Apply 1 application   topically 2 (two) times daily as needed (irritation).)   Biotin 1000 MCG tablet Take 1,000 mcg by mouth daily. (Patient not taking: Reported on 03/24/2024)   No current facility-administered medications on file prior to visit.    Allergies:  Allergies  Allergen Reactions   Azithromycin  Rash   Effexor  [Venlafaxine ] Other (See Comments)    Dizzy, confused   Lactose Intolerance (Gi) Diarrhea   Prednisone Palpitations and Other (See Comments)    increased heartrate   Tape Rash    Adhesive tape    Social History:  Social History   Socioeconomic History   Marital status: Married    Spouse name: Hewitt Lou   Number of children: 1   Years of education: Not on file   Highest education level: Bachelor's degree (e.g., BA, AB, BS)  Occupational History   Occupation: retired  Tobacco Use   Smoking status: Former    Current packs/day: 0.00    Average packs/day: 2.0 packs/day for 2.0 years (4.0 ttl pk-yrs)    Types: Cigarettes    Start date: 12/18/1976    Quit date: 12/18/1978    Years since quitting: 45.4   Smokeless tobacco: Never   Tobacco comments:    quit in 1980  Vaping Use   Vaping status: Never Used  Substance and Sexual Activity   Alcohol use: Not Currently    Alcohol/week: 0.0 standard drinks of alcohol   Drug use: No   Sexual activity: Yes  Other Topics Concern   Not on file  Social History Narrative   Attends church, 1 son deceased   Social Drivers of Corporate investment banker Strain: Low Risk  (05/14/2024)   Overall Financial Resource Strain (CARDIA)    Difficulty of Paying Living Expenses: Not hard at all  Food Insecurity: No Food Insecurity (05/14/2024)   Hunger Vital Sign    Worried About Running Out of Food in the Last Year: Never true    Ran Out of Food in the Last Year: Never true  Transportation Needs: No Transportation Needs (05/14/2024)   PRAPARE - Administrator, Civil Service (Medical): No    Lack of Transportation (Non-Medical): No  Physical  Activity: Inactive (05/14/2024)   Exercise Vital Sign    Days of Exercise per Week: 0 days    Minutes of Exercise per Session: 0 min  Stress: No Stress Concern Present (05/14/2024)   [employer] of Occupational Health - Occupational Stress Questionnaire    Feeling of Stress : Not at all  Social Connections: Socially Integrated (05/14/2024)   Social Connection and Isolation Panel [NHANES]    Frequency of Communication with Friends and Family: More than three times a week  Frequency of Social Gatherings with Friends and Family: More than three times a week    Attends Religious Services: More than 4 times per year    Active Member of [employer] or Organizations: Yes    Attends Engineer, structural: More than 4 times per year    Marital Status: Married  Catering manager Violence: Not At Risk (05/14/2024)   Humiliation, Afraid, Rape, and Kick questionnaire    Fear of Current or Ex-Partner: No    Emotionally Abused: No    Physically Abused: No    Sexually Abused: No   Social History   Tobacco Use  Smoking Status Former   Current packs/day: 0.00   Average packs/day: 2.0 packs/day for 2.0 years (4.0 ttl pk-yrs)   Types: Cigarettes   Start date: 12/18/1976   Quit date: 12/18/1978   Years since quitting: 45.4  Smokeless Tobacco Never  Tobacco Comments   quit in 1980   Social History   Substance and Sexual Activity  Alcohol Use Not Currently   Alcohol/week: 0.0 standard drinks of alcohol    Family History:  Family History  Problem Relation Age of Onset   Arthritis Mother    Heart failure Mother    Hypertension Mother    Hyperlipidemia Mother    Heart disease Mother    Dementia Mother    Asthma Father    Aneurysm Sister    Lung cancer Son    Liver cancer Son    Alzheimer's disease Maternal Grandmother     Past medical history, surgical history, medications, allergies, family history and social history reviewed with patient today and changes made to appropriate areas  of the chart.   Review of Systems  Constitutional: Negative.   HENT: Negative.         Ear itching- chronic  Eyes: Negative.   Respiratory: Negative.    Cardiovascular:  Positive for palpitations and leg swelling. Negative for chest pain, orthopnea, claudication and PND.  Gastrointestinal: Negative.   Genitourinary: Negative.   Musculoskeletal: Negative.   Skin: Negative.   Neurological: Negative.   Endo/Heme/Allergies:  Positive for environmental allergies. Negative for polydipsia. Does not bruise/bleed easily.  Psychiatric/Behavioral: Negative.     All other ROS negative except what is listed above and in the HPI.      Objective:     BP 126/68   Pulse 82   Ht 5' 2.5" (1.588 m)   Wt 153 lb 3.2 oz (69.5 kg)   LMP 07/31/1981 (Approximate)   SpO2 95%   BMI 27.57 kg/m   Wt Readings from Last 3 Encounters:  05/14/24 153 lb 3.2 oz (69.5 kg)  03/31/24 153 lb (69.4 kg)  03/24/24 153 lb 3.2 oz (69.5 kg)    Physical Exam Vitals and nursing note reviewed.  Constitutional:      General: She is not in acute distress.    Appearance: Normal appearance. She is not ill-appearing, toxic-appearing or diaphoretic.  HENT:     Head: Normocephalic and atraumatic.     Right Ear: Tympanic membrane, ear canal and external ear normal. There is no impacted cerumen.     Left Ear: Tympanic membrane, ear canal and external ear normal. There is no impacted cerumen.     Nose: Nose normal. No congestion or rhinorrhea.     Mouth/Throat:     Mouth: Mucous membranes are moist.     Pharynx: Oropharynx is clear. No oropharyngeal exudate or posterior oropharyngeal erythema.  Eyes:     General: No scleral  icterus.       Right eye: No discharge.        Left eye: No discharge.     Extraocular Movements: Extraocular movements intact.     Conjunctiva/sclera: Conjunctivae normal.     Pupils: Pupils are equal, round, and reactive to light.  Neck:     Vascular: No carotid bruit.  Cardiovascular:     Rate  and Rhythm: Normal rate and regular rhythm.     Pulses: Normal pulses.     Heart sounds: No murmur heard.    No friction rub. No gallop.  Pulmonary:     Effort: Pulmonary effort is normal. No respiratory distress.     Breath sounds: Normal breath sounds. No stridor. No wheezing, rhonchi or rales.  Chest:     Chest wall: No tenderness.  Abdominal:     General: Abdomen is flat. Bowel sounds are normal. There is no distension.     Palpations: Abdomen is soft. There is no mass.     Tenderness: There is no abdominal tenderness. There is no right CVA tenderness, left CVA tenderness, guarding or rebound.     Hernia: No hernia is present.  Genitourinary:    Comments: Breast and pelvic exams deferred with shared decision making Musculoskeletal:        General: No swelling, tenderness, deformity or signs of injury.     Cervical back: Normal range of motion and neck supple. No rigidity. No muscular tenderness.     Right lower leg: No edema.     Left lower leg: No edema.  Lymphadenopathy:     Cervical: No cervical adenopathy.  Skin:    General: Skin is warm and dry.     Capillary Refill: Capillary refill takes less than 2 seconds.     Coloration: Skin is not jaundiced or pale.     Findings: No bruising, erythema, lesion or rash.  Neurological:     General: No focal deficit present.     Mental Status: She is alert and oriented to person, place, and time. Mental status is at baseline.     Cranial Nerves: No cranial nerve deficit.     Sensory: No sensory deficit.     Motor: No weakness.     Coordination: Coordination normal.     Gait: Gait normal.     Deep Tendon Reflexes: Reflexes normal.  Psychiatric:        Mood and Affect: Mood normal.        Behavior: Behavior normal.        Thought Content: Thought content normal.        Judgment: Judgment normal.       05/14/2024    8:52 AM 03/24/2024    9:16 AM 03/20/2023    9:14 AM 03/04/2021    8:21 AM 05/03/2020    8:37 AM  6CIT Screen  What  Year? 0 points 0 points 0 points 0 points 0 points  What month? 0 points 0 points 0 points 0 points 0 points  What time? 0 points 0 points 0 points 0 points 0 points  Count back from 20 0 points 0 points 0 points 0 points 0 points  Months in reverse 0 points 0 points 0 points 0 points 0 points  Repeat phrase 0 points 0 points 0 points 0 points 2 points  Total Score 0 points 0 points 0 points 0 points 2 points      Results for orders placed or performed in visit on 05/14/24  Microalbumin, Urine Waived   Collection Time: 05/14/24  8:43 AM  Result Value Ref Range   Microalb, Ur Waived 30 (H) 0 - 19 mg/L   Creatinine, Urine Waived 100 10 - 300 mg/dL   Microalb/Creat Ratio <30 <30 mg/g      Assessment & Plan:   Problem List Items Addressed This Visit       Cardiovascular and Mediastinum   Benign essential HTN   BP doing well but running high at home. Will start 2.5mg  lisinopril and recheck in 1 month. Call with any concerns. Labs drawn today.      Relevant Medications   lisinopril (ZESTRIL) 2.5 MG tablet   atorvastatin  (LIPITOR) 80 MG tablet   Other Relevant Orders   CBC with Differential/Platelet   Comprehensive metabolic panel with GFR   TSH   Microalbumin, Urine Waived (Completed)     Digestive   GERD (gastroesophageal reflux disease)   Under good control on current regimen. Continue current regimen. Continue to monitor. Call with any concerns. Refills given.        Relevant Orders   CBC with Differential/Platelet   Comprehensive metabolic panel with GFR     Other   Hyperlipidemia   Under good control on current regimen. Continue current regimen. Continue to monitor. Call with any concerns. Refills given. Labs drawn today.        Relevant Medications   lisinopril (ZESTRIL) 2.5 MG tablet   atorvastatin  (LIPITOR) 80 MG tablet   Other Relevant Orders   CBC with Differential/Platelet   Comprehensive metabolic panel with GFR   Lipid Panel w/o Chol/HDL Ratio    Vitamin D  deficiency   Rechecking labs today. Await results. Treat as needed.       Relevant Orders   CBC with Differential/Platelet   Comprehensive metabolic panel with GFR   VITAMIN D  25 Hydroxy (Vit-D Deficiency, Fractures)   Other Visit Diagnoses       Routine general medical examination at a health care facility    -  Primary   Vaccines up to date. Screening labs checked today. Mammo and DEXA up to date. Continue diet and exercise. Call with any concerns.     Concern about memory       6CIT normal. Suspect stress-related. Will check labs. Offered referral to neurology- will hold at this time.   Relevant Orders   B12        Follow up plan: Return in about 4 weeks (around 06/11/2024).   LABORATORY TESTING:  - Pap smear: not applicable  IMMUNIZATIONS:   - Tdap: Tetanus vaccination status reviewed: last tetanus booster within 10 years. - Influenza: Up to date - Pneumovax: Up to date - Prevnar: Up to date - COVID: Up to date - HPV: Not applicable - Shingrix vaccine: Up to date  SCREENING: -Mammogram: Ordered today  - Colonoscopy: Not applicable  - Bone Density: Ordered today    PATIENT COUNSELING:   Advised to take 1 mg of folate supplement per day if capable of pregnancy.   Sexuality: Discussed sexually transmitted diseases, partner selection, use of condoms, avoidance of unintended pregnancy  and contraceptive alternatives.   Advised to avoid cigarette smoking.  I discussed with the patient that most people either abstain from alcohol or drink within safe limits (<=14/week and <=4 drinks/occasion for males, <=7/weeks and <= 3 drinks/occasion for females) and that the risk for alcohol disorders and other health effects rises proportionally with the number of drinks per week and how often a  drinker exceeds daily limits.  Discussed cessation/primary prevention of drug use and availability of treatment for abuse.   Diet: Encouraged to adjust caloric intake to  maintain  or achieve ideal body weight, to reduce intake of dietary saturated fat and total fat, to limit sodium intake by avoiding high sodium foods and not adding table salt, and to maintain adequate dietary potassium and calcium  preferably from fresh fruits, vegetables, and low-fat dairy products.    stressed the importance of regular exercise  Injury prevention: Discussed safety belts, safety helmets, smoke detector, smoking near bedding or upholstery.   Dental health: Discussed importance of regular tooth brushing, flossing, and dental visits.    NEXT PREVENTATIVE PHYSICAL DUE IN 1 YEAR. Return in about 4 weeks (around 06/11/2024).

## 2024-05-14 NOTE — Assessment & Plan Note (Signed)
 BP doing well but running high at home. Will start 2.5mg  lisinopril and recheck in 1 month. Call with any concerns. Labs drawn today.

## 2024-05-14 NOTE — Assessment & Plan Note (Signed)
 Rechecking labs today. Await results. Treat as needed.

## 2024-05-15 LAB — COMPREHENSIVE METABOLIC PANEL WITH GFR
ALT: 16 IU/L (ref 0–32)
AST: 23 IU/L (ref 0–40)
Albumin: 4 g/dL (ref 3.8–4.8)
Alkaline Phosphatase: 132 IU/L — ABNORMAL HIGH (ref 44–121)
BUN/Creatinine Ratio: 10 — ABNORMAL LOW (ref 12–28)
BUN: 10 mg/dL (ref 8–27)
Bilirubin Total: 0.3 mg/dL (ref 0.0–1.2)
CO2: 23 mmol/L (ref 20–29)
Calcium: 9.7 mg/dL (ref 8.7–10.3)
Chloride: 103 mmol/L (ref 96–106)
Creatinine, Ser: 0.96 mg/dL (ref 0.57–1.00)
Globulin, Total: 2.2 g/dL (ref 1.5–4.5)
Glucose: 101 mg/dL — ABNORMAL HIGH (ref 70–99)
Potassium: 3.9 mmol/L (ref 3.5–5.2)
Sodium: 142 mmol/L (ref 134–144)
Total Protein: 6.2 g/dL (ref 6.0–8.5)
eGFR: 61 mL/min/{1.73_m2} (ref >59)

## 2024-05-15 LAB — CBC WITH DIFFERENTIAL/PLATELET
Basophils Absolute: 0.1 10*3/uL (ref 0.0–0.2)
Basos: 1 %
EOS (ABSOLUTE): 0.6 10*3/uL — ABNORMAL HIGH (ref 0.0–0.4)
Eos: 12 %
Hematocrit: 40.2 % (ref 34.0–46.6)
Hemoglobin: 12.8 g/dL (ref 11.1–15.9)
Immature Grans (Abs): 0 10*3/uL (ref 0.0–0.1)
Immature Granulocytes: 0 %
Lymphocytes Absolute: 1.3 10*3/uL (ref 0.7–3.1)
Lymphs: 25 %
MCH: 26.6 pg (ref 26.6–33.0)
MCHC: 31.8 g/dL (ref 31.5–35.7)
MCV: 84 fL (ref 79–97)
Monocytes Absolute: 0.5 10*3/uL (ref 0.1–0.9)
Monocytes: 9 %
Neutrophils Absolute: 2.8 10*3/uL (ref 1.4–7.0)
Neutrophils: 53 %
Platelets: 338 10*3/uL (ref 150–450)
RBC: 4.81 x10E6/uL (ref 3.77–5.28)
RDW: 15.1 % (ref 11.7–15.4)
WBC: 5.3 10*3/uL (ref 3.4–10.8)

## 2024-05-15 LAB — LIPID PANEL W/O CHOL/HDL RATIO
Cholesterol, Total: 163 mg/dL (ref 100–199)
HDL: 58 mg/dL (ref >39)
LDL Chol Calc (NIH): 84 mg/dL (ref 0–99)
Triglycerides: 119 mg/dL (ref 0–149)
VLDL Cholesterol Cal: 21 mg/dL (ref 5–40)

## 2024-05-15 LAB — VITAMIN B12: Vitamin B-12: 688 pg/mL (ref 232–1245)

## 2024-05-15 LAB — VITAMIN D 25 HYDROXY (VIT D DEFICIENCY, FRACTURES): Vit D, 25-Hydroxy: 36.3 ng/mL (ref 30.0–100.0)

## 2024-05-15 LAB — TSH: TSH: 2.41 u[IU]/mL (ref 0.450–4.500)

## 2024-05-19 ENCOUNTER — Other Ambulatory Visit: Payer: Self-pay | Admitting: Family Medicine

## 2024-05-20 NOTE — Telephone Encounter (Signed)
 Requesting pharmacy  change Requested Prescriptions  Pending Prescriptions Disp Refills   montelukast  (SINGULAIR ) 10 MG tablet [Pharmacy Med Name: MONTELUKAST  SOD 10 MG TABLET] 90 tablet 0    Sig: Take 1 tablet (10 mg total) by mouth at bedtime.     Pulmonology:  Leukotriene Inhibitors Passed - 05/20/2024  2:09 PM      Passed - Valid encounter within last 12 months    Recent Outpatient Visits           6 days ago Routine general medical examination at a health care facility   [facility], Megan P, DO   1 month ago Upper respiratory tract infection, unspecified type   [facility], Megan P, DO

## 2024-05-26 ENCOUNTER — Ambulatory Visit: Payer: Self-pay | Admitting: Family Medicine

## 2024-06-19 ENCOUNTER — Encounter: Payer: Self-pay | Admitting: Family Medicine

## 2024-06-19 ENCOUNTER — Ambulatory Visit (INDEPENDENT_AMBULATORY_CARE_PROVIDER_SITE_OTHER): Admitting: Family Medicine

## 2024-06-19 VITALS — BP 128/71 | HR 88 | Temp 98.1°F | Wt 153.0 lb

## 2024-06-19 DIAGNOSIS — I10 Essential (primary) hypertension: Secondary | ICD-10-CM

## 2024-06-19 NOTE — Assessment & Plan Note (Signed)
 Doing great. Did not start her medicine. Does not need to start it. Continue to monitor. Call with any concerns.

## 2024-06-19 NOTE — Progress Notes (Signed)
 BP 128/71   Pulse 88   Temp 98.1 F (36.7 C) (Oral)   Wt 153 lb (69.4 kg)   LMP 07/31/1981 (Approximate)   SpO2 99%   BMI 27.54 kg/m    Subjective:    Patient ID: Nancy Mack, female    DOB: 02-27-1946, 78 y.o.   MRN: [id number]  HPI: Nancy Mack is a 78 y.o. female  Chief Complaint  Patient presents with   Follow-up    Blood pressure follow-up    HYPERTENSION- has not been taking her lisinopril  as her BP had been running well at home Hypertension status: controlled  Satisfied with current treatment? yes Duration of hypertension: chronic BP monitoring frequency:  a few times a month BP range: 104-128 systolic BP medication side effects:  N/A Medication compliance: poor compliance Previous BP meds:lisinopril  Aspirin : no Recurrent headaches: no Visual changes: no Palpitations: no Dyspnea: no Chest pain: no Lower extremity edema: no Dizzy/lightheaded: no  Relevant past medical, surgical, family and social history reviewed and updated as indicated. Interim medical history since our last visit reviewed. Allergies and medications reviewed and updated.  Review of Systems  Constitutional: Negative.   Respiratory: Negative.    Cardiovascular: Negative.   Musculoskeletal: Negative.   Neurological: Negative.   Psychiatric/Behavioral: Negative.      Per HPI unless specifically indicated above     Objective:    BP 128/71   Pulse 88   Temp 98.1 F (36.7 C) (Oral)   Wt 153 lb (69.4 kg)   LMP 07/31/1981 (Approximate)   SpO2 99%   BMI 27.54 kg/m   Wt Readings from Last 3 Encounters:  06/19/24 153 lb (69.4 kg)  05/14/24 153 lb 3.2 oz (69.5 kg)  03/31/24 153 lb (69.4 kg)    Physical Exam Vitals and nursing note reviewed.  Constitutional:      General: She is not in acute distress.    Appearance: Normal appearance. She is not ill-appearing, toxic-appearing or diaphoretic.  HENT:     Head: Normocephalic and atraumatic.     Right Ear: External  ear normal.     Left Ear: External ear normal.     Nose: Nose normal.     Mouth/Throat:     Mouth: Mucous membranes are moist.     Pharynx: Oropharynx is clear.  Eyes:     General: No scleral icterus.       Right eye: No discharge.        Left eye: No discharge.     Extraocular Movements: Extraocular movements intact.     Conjunctiva/sclera: Conjunctivae normal.     Pupils: Pupils are equal, round, and reactive to light.  Cardiovascular:     Rate and Rhythm: Normal rate and regular rhythm.     Pulses: Normal pulses.     Heart sounds: Normal heart sounds. No murmur heard.    No friction rub. No gallop.  Pulmonary:     Effort: Pulmonary effort is normal. No respiratory distress.     Breath sounds: Normal breath sounds. No stridor. No wheezing, rhonchi or rales.  Chest:     Chest wall: No tenderness.  Musculoskeletal:        General: Normal range of motion.     Cervical back: Normal range of motion and neck supple.  Skin:    General: Skin is warm and dry.     Capillary Refill: Capillary refill takes less than 2 seconds.     Coloration: Skin is not jaundiced or  pale.     Findings: No bruising, erythema, lesion or rash.  Neurological:     General: No focal deficit present.     Mental Status: She is alert and oriented to person, place, and time. Mental status is at baseline.  Psychiatric:        Mood and Affect: Mood normal.        Behavior: Behavior normal.        Thought Content: Thought content normal.        Judgment: Judgment normal.     Results for orders placed or performed in visit on 05/14/24  Microalbumin, Urine Waived   Collection Time: 05/14/24  8:43 AM  Result Value Ref Range   Microalb, Ur Waived 30 (H) 0 - 19 mg/L   Creatinine, Urine Waived 100 10 - 300 mg/dL   Microalb/Creat Ratio <30 <30 mg/g  CBC with Differential/Platelet   Collection Time: 05/14/24  8:44 AM  Result Value Ref Range   WBC 5.3 3.4 - 10.8 x10E3/uL   RBC 4.81 3.77 - 5.28 x10E6/uL    Hemoglobin 12.8 11.1 - 15.9 g/dL   Hematocrit 59.7 65.9 - 46.6 %   MCV 84 79 - 97 fL   MCH 26.6 26.6 - 33.0 pg   MCHC 31.8 31.5 - 35.7 g/dL   RDW 84.8 88.2 - 84.5 %   Platelets 338 150 - 450 x10E3/uL   Neutrophils 53 Not Estab. %   Lymphs 25 Not Estab. %   Monocytes 9 Not Estab. %   Eos 12 Not Estab. %   Basos 1 Not Estab. %   Neutrophils Absolute 2.8 1.4 - 7.0 x10E3/uL   Lymphocytes Absolute 1.3 0.7 - 3.1 x10E3/uL   Monocytes Absolute 0.5 0.1 - 0.9 x10E3/uL   EOS (ABSOLUTE) 0.6 (H) 0.0 - 0.4 x10E3/uL   Basophils Absolute 0.1 0.0 - 0.2 x10E3/uL   Immature Granulocytes 0 Not Estab. %   Immature Grans (Abs) 0.0 0.0 - 0.1 x10E3/uL  Comprehensive metabolic panel with GFR   Collection Time: 05/14/24  8:44 AM  Result Value Ref Range   Glucose 101 (H) 70 - 99 mg/dL   BUN 10 8 - 27 mg/dL   Creatinine, Ser 9.03 0.57 - 1.00 mg/dL   eGFR 61 >40 fO/fpw/8.26   BUN/Creatinine Ratio 10 (L) 12 - 28   Sodium 142 134 - 144 mmol/L   Potassium 3.9 3.5 - 5.2 mmol/L   Chloride 103 96 - 106 mmol/L   CO2 23 20 - 29 mmol/L   Calcium  9.7 8.7 - 10.3 mg/dL   Total Protein 6.2 6.0 - 8.5 g/dL   Albumin 4.0 3.8 - 4.8 g/dL   Globulin, Total 2.2 1.5 - 4.5 g/dL   Bilirubin Total 0.3 0.0 - 1.2 mg/dL   Alkaline Phosphatase 132 (H) 44 - 121 IU/L   AST 23 0 - 40 IU/L   ALT 16 0 - 32 IU/L  Lipid Panel w/o Chol/HDL Ratio   Collection Time: 05/14/24  8:44 AM  Result Value Ref Range   Cholesterol, Total 163 100 - 199 mg/dL   Triglycerides 880 0 - 149 mg/dL   HDL 58 >60 mg/dL   VLDL Cholesterol Cal 21 5 - 40 mg/dL   LDL Chol Calc (NIH) 84 0 - 99 mg/dL  TSH   Collection Time: 05/14/24  8:44 AM  Result Value Ref Range   TSH 2.410 0.450 - 4.500 uIU/mL  VITAMIN D  25 Hydroxy (Vit-D Deficiency, Fractures)   Collection Time: 05/14/24  8:44  AM  Result Value Ref Range   Vit D, 25-Hydroxy 36.3 30.0 - 100.0 ng/mL  B12   Collection Time: 05/14/24  8:44 AM  Result Value Ref Range   Vitamin B-12 688 232 - 1,245  pg/mL      Assessment & Plan:   Problem List Items Addressed This Visit       Cardiovascular and Mediastinum   Benign essential HTN - Primary   Doing great. Did not start her medicine. Does not need to start it. Continue to monitor. Call with any concerns.         Follow up plan: Return in about 5 months (around 11/05/2024).   15 minutes spent with patient today

## 2024-08-05 DIAGNOSIS — E782 Mixed hyperlipidemia: Secondary | ICD-10-CM | POA: Diagnosis not present

## 2024-08-05 DIAGNOSIS — I700 Atherosclerosis of aorta: Secondary | ICD-10-CM | POA: Diagnosis not present

## 2024-08-05 DIAGNOSIS — R002 Palpitations: Secondary | ICD-10-CM | POA: Diagnosis not present

## 2024-08-05 DIAGNOSIS — I38 Endocarditis, valve unspecified: Secondary | ICD-10-CM | POA: Diagnosis not present

## 2024-08-05 DIAGNOSIS — R0602 Shortness of breath: Secondary | ICD-10-CM | POA: Diagnosis not present

## 2024-08-05 DIAGNOSIS — R0789 Other chest pain: Secondary | ICD-10-CM | POA: Diagnosis not present

## 2024-08-05 DIAGNOSIS — N1831 Chronic kidney disease, stage 3a: Secondary | ICD-10-CM | POA: Diagnosis not present

## 2024-08-05 DIAGNOSIS — I10 Essential (primary) hypertension: Secondary | ICD-10-CM | POA: Diagnosis not present

## 2024-08-20 DIAGNOSIS — J449 Chronic obstructive pulmonary disease, unspecified: Secondary | ICD-10-CM | POA: Diagnosis not present

## 2024-08-20 DIAGNOSIS — R911 Solitary pulmonary nodule: Secondary | ICD-10-CM | POA: Diagnosis not present

## 2024-08-20 DIAGNOSIS — J4550 Severe persistent asthma, uncomplicated: Secondary | ICD-10-CM | POA: Diagnosis not present

## 2024-08-20 DIAGNOSIS — J849 Interstitial pulmonary disease, unspecified: Secondary | ICD-10-CM | POA: Diagnosis not present

## 2024-08-28 ENCOUNTER — Ambulatory Visit (INDEPENDENT_AMBULATORY_CARE_PROVIDER_SITE_OTHER)

## 2024-08-28 DIAGNOSIS — Z23 Encounter for immunization: Secondary | ICD-10-CM | POA: Diagnosis not present

## 2024-08-28 NOTE — Progress Notes (Signed)
 Patient is in office today for a nurse visit for Immunization. Patient Injection was given in the  Left deltoid. Patient tolerated injection well.

## 2024-09-18 DIAGNOSIS — I10 Essential (primary) hypertension: Secondary | ICD-10-CM | POA: Diagnosis not present

## 2024-09-18 DIAGNOSIS — R002 Palpitations: Secondary | ICD-10-CM | POA: Diagnosis not present

## 2024-09-18 DIAGNOSIS — I700 Atherosclerosis of aorta: Secondary | ICD-10-CM | POA: Diagnosis not present

## 2024-09-18 DIAGNOSIS — N1831 Chronic kidney disease, stage 3a: Secondary | ICD-10-CM | POA: Diagnosis not present

## 2024-09-18 DIAGNOSIS — E782 Mixed hyperlipidemia: Secondary | ICD-10-CM | POA: Diagnosis not present

## 2024-11-05 ENCOUNTER — Encounter: Payer: Self-pay | Admitting: Family Medicine

## 2024-11-05 ENCOUNTER — Telehealth: Payer: Self-pay | Admitting: Family Medicine

## 2024-11-05 ENCOUNTER — Ambulatory Visit (INDEPENDENT_AMBULATORY_CARE_PROVIDER_SITE_OTHER): Admitting: Family Medicine

## 2024-11-05 VITALS — BP 137/79 | HR 80 | Temp 98.0°F | Ht 62.5 in | Wt 153.2 lb

## 2024-11-05 DIAGNOSIS — J4550 Severe persistent asthma, uncomplicated: Secondary | ICD-10-CM

## 2024-11-05 DIAGNOSIS — N644 Mastodynia: Secondary | ICD-10-CM

## 2024-11-05 DIAGNOSIS — E559 Vitamin D deficiency, unspecified: Secondary | ICD-10-CM

## 2024-11-05 DIAGNOSIS — I10 Essential (primary) hypertension: Secondary | ICD-10-CM | POA: Diagnosis not present

## 2024-11-05 DIAGNOSIS — Z23 Encounter for immunization: Secondary | ICD-10-CM | POA: Diagnosis not present

## 2024-11-05 DIAGNOSIS — E782 Mixed hyperlipidemia: Secondary | ICD-10-CM | POA: Diagnosis not present

## 2024-11-05 MED ORDER — ATORVASTATIN CALCIUM 80 MG PO TABS: 80.0000 mg | ORAL_TABLET | Freq: Every day | ORAL | 1 refills | Status: AC

## 2024-11-05 MED ORDER — FLUTICASONE PROPIONATE 50 MCG/ACT NA SUSP: 1.0000 | Freq: Two times a day (BID) | NASAL | 12 refills | Status: AC

## 2024-11-05 MED ORDER — AZELASTINE HCL 0.1 % NA SOLN
2.0000 | Freq: Two times a day (BID) | NASAL | 1 refills | Status: AC
Start: 2024-11-05 — End: ?

## 2024-11-05 MED ORDER — MONTELUKAST SODIUM 10 MG PO TABS: 10.0000 mg | ORAL_TABLET | Freq: Every evening | ORAL | 1 refills | Status: AC

## 2024-11-05 NOTE — Assessment & Plan Note (Signed)
Continue to follow with pulmonology. Call with any concerns.  

## 2024-11-05 NOTE — Patient Instructions (Signed)
 Please call to schedule your mammogram and/or bone density: [facility] at [facility]  Address: [address], [place], [place] [address] Phone: [phone]  [facility] [address] [place],  [place]  [address] Phone: [phone]

## 2024-11-05 NOTE — Assessment & Plan Note (Signed)
 Rechecking labs today. Await results. Treat as needed.

## 2024-11-05 NOTE — Telephone Encounter (Signed)
 Copied from CRM [id number]. Topic: Referral - Question >> Nov 05, 2024  9:43 AM Nancy Mack wrote: Reason for CRM: Pt's mammogram order was sent to the hospital instead of [facility]. Pt is requesting this to be changed. Please advise #[phone]

## 2024-11-05 NOTE — Assessment & Plan Note (Signed)
 Running higher than normal today- did not take her metoprolol. Will work on [employer] and recheck in 3 months. Call with any concerns.

## 2024-11-05 NOTE — Assessment & Plan Note (Signed)
 Under good control on current regimen. Continue current regimen. Continue to monitor. Call with any concerns. Refills given. Labs drawn today.

## 2024-11-05 NOTE — Progress Notes (Signed)
 BP 137/79 (BP Location: Left Arm, Cuff Size: Normal)   Pulse 80   Temp 98 F (36.7 C) (Oral)   Ht 5' 2.5 (1.588 m)   Wt 153 lb 3.2 oz (69.5 kg)   LMP 07/31/1981 (Approximate)   SpO2 98%   BMI 27.57 kg/m    Subjective:    Patient ID: Nancy Mack, female    DOB: 03/06/1946, 78 y.o.   MRN: [id number]  HPI: Nancy Mack is a 78 y.o. female  Chief Complaint  Patient presents with   Hypertension    BP @ home 120-122/69-70. BP checked twice a week.   HYPERTENSION / HYPERLIPIDEMIA Satisfied with current treatment? yes Duration of hypertension: chronic BP monitoring frequency: a few times a week BP range: 120s/70s BP medication side effects: no Past BP meds: metoprolol Duration of hyperlipidemia: chronic Cholesterol medication side effects: no Cholesterol supplements: none Past cholesterol medications: atorvastatin  Medication compliance: good compliance Aspirin : yes Recent stressors: no Recurrent headaches: no Visual changes: no Palpitations: no Dyspnea: no Chest pain: no Lower extremity edema: no Dizzy/lightheaded: no  BREAST PAIN Duration :few days to maybe a week Location: R upper outer quadrant Onset: sudden Severity: moderate Quality: aching and sore Frequency: intermittent Redness: no Swelling: no Trauma: no trauma Breastfeeding: no Associated with menstral cycle: no Nipple discharge: no Breast lump: no Status: fluctuating Treatments attempted: none Previous mammogram: yes   Relevant past medical, surgical, family and social history reviewed and updated as indicated. Interim medical history since our last visit reviewed. Allergies and medications reviewed and updated.  Review of Systems  Constitutional: Negative.   Respiratory: Negative.    Cardiovascular: Negative.   Musculoskeletal: Negative.   Neurological: Negative.   Psychiatric/Behavioral: Negative.      Per HPI unless specifically indicated above     Objective:     BP 137/79 (BP Location: Left Arm, Cuff Size: Normal)   Pulse 80   Temp 98 F (36.7 C) (Oral)   Ht 5' 2.5 (1.588 m)   Wt 153 lb 3.2 oz (69.5 kg)   LMP 07/31/1981 (Approximate)   SpO2 98%   BMI 27.57 kg/m   Wt Readings from Last 3 Encounters:  11/05/24 153 lb 3.2 oz (69.5 kg)  06/19/24 153 lb (69.4 kg)  05/14/24 153 lb 3.2 oz (69.5 kg)    Physical Exam Vitals and nursing note reviewed.  Constitutional:      General: She is not in acute distress.    Appearance: Normal appearance. She is not ill-appearing, toxic-appearing or diaphoretic.  HENT:     Head: Normocephalic and atraumatic.     Right Ear: External ear normal.     Left Ear: External ear normal.     Nose: Nose normal.     Mouth/Throat:     Mouth: Mucous membranes are moist.     Pharynx: Oropharynx is clear.  Eyes:     General: No scleral icterus.       Right eye: No discharge.        Left eye: No discharge.     Extraocular Movements: Extraocular movements intact.     Conjunctiva/sclera: Conjunctivae normal.     Pupils: Pupils are equal, round, and reactive to light.  Cardiovascular:     Rate and Rhythm: Normal rate and regular rhythm.     Pulses: Normal pulses.     Heart sounds: Normal heart sounds. No murmur heard.    No friction rub. No gallop.  Pulmonary:     Effort: Pulmonary  effort is normal. No respiratory distress.     Breath sounds: Normal breath sounds. No stridor. No wheezing, rhonchi or rales.  Chest:     Chest wall: No tenderness.  Musculoskeletal:        General: Normal range of motion.     Cervical back: Normal range of motion and neck supple.  Skin:    General: Skin is warm and dry.     Capillary Refill: Capillary refill takes less than 2 seconds.     Coloration: Skin is not jaundiced or pale.     Findings: No bruising, erythema, lesion or rash.  Neurological:     General: No focal deficit present.     Mental Status: She is alert and oriented to person, place, and time. Mental status is at  baseline.  Psychiatric:        Mood and Affect: Mood normal.        Behavior: Behavior normal.        Thought Content: Thought content normal.        Judgment: Judgment normal.     Results for orders placed or performed in visit on 05/14/24  Microalbumin, Urine Waived   Collection Time: 05/14/24  8:43 AM  Result Value Ref Range   Microalb, Ur Waived 30 (H) 0 - 19 mg/L   Creatinine, Urine Waived 100 10 - 300 mg/dL   Microalb/Creat Ratio <30 <30 mg/g  CBC with Differential/Platelet   Collection Time: 05/14/24  8:44 AM  Result Value Ref Range   WBC 5.3 3.4 - 10.8 x10E3/uL   RBC 4.81 3.77 - 5.28 x10E6/uL   Hemoglobin 12.8 11.1 - 15.9 g/dL   Hematocrit 59.7 65.9 - 46.6 %   MCV 84 79 - 97 fL   MCH 26.6 26.6 - 33.0 pg   MCHC 31.8 31.5 - 35.7 g/dL   RDW 84.8 88.2 - 84.5 %   Platelets 338 150 - 450 x10E3/uL   Neutrophils 53 Not Estab. %   Lymphs 25 Not Estab. %   Monocytes 9 Not Estab. %   Eos 12 Not Estab. %   Basos 1 Not Estab. %   Neutrophils Absolute 2.8 1.4 - 7.0 x10E3/uL   Lymphocytes Absolute 1.3 0.7 - 3.1 x10E3/uL   Monocytes Absolute 0.5 0.1 - 0.9 x10E3/uL   EOS (ABSOLUTE) 0.6 (H) 0.0 - 0.4 x10E3/uL   Basophils Absolute 0.1 0.0 - 0.2 x10E3/uL   Immature Granulocytes 0 Not Estab. %   Immature Grans (Abs) 0.0 0.0 - 0.1 x10E3/uL  Comprehensive metabolic panel with GFR   Collection Time: 05/14/24  8:44 AM  Result Value Ref Range   Glucose 101 (H) 70 - 99 mg/dL   BUN 10 8 - 27 mg/dL   Creatinine, Ser 9.03 0.57 - 1.00 mg/dL   eGFR 61 >40 fO/fpw/8.26   BUN/Creatinine Ratio 10 (L) 12 - 28   Sodium 142 134 - 144 mmol/L   Potassium 3.9 3.5 - 5.2 mmol/L   Chloride 103 96 - 106 mmol/L   CO2 23 20 - 29 mmol/L   Calcium  9.7 8.7 - 10.3 mg/dL   Total Protein 6.2 6.0 - 8.5 g/dL   Albumin 4.0 3.8 - 4.8 g/dL   Globulin, Total 2.2 1.5 - 4.5 g/dL   Bilirubin Total 0.3 0.0 - 1.2 mg/dL   Alkaline Phosphatase 132 (H) 44 - 121 IU/L   AST 23 0 - 40 IU/L   ALT 16 0 - 32 IU/L  Lipid  Panel w/o Chol/HDL Ratio  Collection Time: 05/14/24  8:44 AM  Result Value Ref Range   Cholesterol, Total 163 100 - 199 mg/dL   Triglycerides 880 0 - 149 mg/dL   HDL 58 >60 mg/dL   VLDL Cholesterol Cal 21 5 - 40 mg/dL   LDL Chol Calc (NIH) 84 0 - 99 mg/dL  TSH   Collection Time: 05/14/24  8:44 AM  Result Value Ref Range   TSH 2.410 0.450 - 4.500 uIU/mL  VITAMIN D  25 Hydroxy (Vit-D Deficiency, Fractures)   Collection Time: 05/14/24  8:44 AM  Result Value Ref Range   Vit D, 25-Hydroxy 36.3 30.0 - 100.0 ng/mL  B12   Collection Time: 05/14/24  8:44 AM  Result Value Ref Range   Vitamin B-12 688 232 - 1,245 pg/mL      Assessment & Plan:   Problem List Items Addressed This Visit       Cardiovascular and Mediastinum   Benign essential HTN - Primary   Running higher than normal today- did not take her metoprolol. Will work on [employer] and recheck in 3 months. Call with any concerns.       Relevant Medications   metoprolol succinate (TOPROL-XL) 25 MG 24 hr tablet   atorvastatin  (LIPITOR) 80 MG tablet   Other Relevant Orders   Comprehensive metabolic panel with GFR   CBC with Differential/Platelet     Respiratory   Severe persistent asthma without complication (HCC)   Continue to follow with pulmonology. Call with any concerns.       Relevant Medications   montelukast  (SINGULAIR ) 10 MG tablet     Other   Hyperlipidemia   Under good control on current regimen. Continue current regimen. Continue to monitor. Call with any concerns. Refills given. Labs drawn today.        Relevant Medications   metoprolol succinate (TOPROL-XL) 25 MG 24 hr tablet   atorvastatin  (LIPITOR) 80 MG tablet   Other Relevant Orders   Comprehensive metabolic panel with GFR   CBC with Differential/Platelet   Lipid Panel w/o Chol/HDL Ratio   Vitamin D  deficiency   Rechecking labs today. Await results. Treat as needed.       Relevant Orders   VITAMIN D  25 Hydroxy (Vit-D Deficiency, Fractures)    Other Visit Diagnoses       Breast pain, right       Will get her set up for mammo/US . Await results.   Relevant Orders   MM 3D DIAGNOSTIC MAMMOGRAM UNILATERAL RIGHT BREAST   US  LIMITED ULTRASOUND INCLUDING AXILLA RIGHT BREAST     Need for COVID-19 vaccine       COVID shot given today.   Relevant Orders   Pfizer Comirnaty Covid -19 Vaccine 65yrs and older        Follow up plan: Return in about 3 months (around 02/05/2025).

## 2024-11-06 LAB — CBC WITH DIFFERENTIAL/PLATELET
Basophils Absolute: 0 x10E3/uL (ref 0.0–0.2)
Basos: 1 %
EOS (ABSOLUTE): 0.4 x10E3/uL (ref 0.0–0.4)
Eos: 7 %
Hematocrit: 40.2 % (ref 34.0–46.6)
Hemoglobin: 12.7 g/dL (ref 11.1–15.9)
Immature Grans (Abs): 0 x10E3/uL (ref 0.0–0.1)
Immature Granulocytes: 0 %
Lymphocytes Absolute: 1.4 x10E3/uL (ref 0.7–3.1)
Lymphs: 25 %
MCH: 26.7 pg (ref 26.6–33.0)
MCHC: 31.6 g/dL (ref 31.5–35.7)
MCV: 85 fL (ref 79–97)
Monocytes Absolute: 0.4 x10E3/uL (ref 0.1–0.9)
Monocytes: 7 %
Neutrophils Absolute: 3.5 x10E3/uL (ref 1.4–7.0)
Neutrophils: 60 %
Platelets: 348 x10E3/uL (ref 150–450)
RBC: 4.76 x10E6/uL (ref 3.77–5.28)
RDW: 14.9 % (ref 11.7–15.4)
WBC: 5.7 x10E3/uL (ref 3.4–10.8)

## 2024-11-06 LAB — COMPREHENSIVE METABOLIC PANEL WITH GFR
ALT: 9 IU/L (ref 0–32)
AST: 19 IU/L (ref 0–40)
Albumin: 4.1 g/dL (ref 3.8–4.8)
Alkaline Phosphatase: 121 IU/L (ref 49–135)
BUN/Creatinine Ratio: 9 — ABNORMAL LOW (ref 12–28)
BUN: 9 mg/dL (ref 8–27)
Bilirubin Total: 0.2 mg/dL (ref 0.0–1.2)
CO2: 22 mmol/L (ref 20–29)
Calcium: 9.6 mg/dL (ref 8.7–10.3)
Chloride: 104 mmol/L (ref 96–106)
Creatinine, Ser: 1.02 mg/dL — ABNORMAL HIGH (ref 0.57–1.00)
Globulin, Total: 2.2 g/dL (ref 1.5–4.5)
Glucose: 100 mg/dL — ABNORMAL HIGH (ref 70–99)
Potassium: 4.1 mmol/L (ref 3.5–5.2)
Sodium: 143 mmol/L (ref 134–144)
Total Protein: 6.3 g/dL (ref 6.0–8.5)
eGFR: 57 mL/min/1.73 — ABNORMAL LOW (ref >59)

## 2024-11-06 LAB — VITAMIN D 25 HYDROXY (VIT D DEFICIENCY, FRACTURES): Vit D, 25-Hydroxy: 30.8 ng/mL (ref 30.0–100.0)

## 2024-11-06 LAB — LIPID PANEL W/O CHOL/HDL RATIO
Cholesterol, Total: 173 mg/dL (ref 100–199)
HDL: 70 mg/dL (ref >39)
LDL Chol Calc (NIH): 87 mg/dL (ref 0–99)
Triglycerides: 85 mg/dL (ref 0–149)
VLDL Cholesterol Cal: 16 mg/dL (ref 5–40)

## 2024-11-07 NOTE — Telephone Encounter (Signed)
 Diagnostic mammogram order faxed to [facility] per patient request.   Called and notified patient that this was done for her.

## 2024-11-11 ENCOUNTER — Ambulatory Visit: Payer: Self-pay | Admitting: Family Medicine

## 2024-12-08 ENCOUNTER — Telehealth: Payer: Self-pay | Admitting: Family Medicine

## 2024-12-08 NOTE — Telephone Encounter (Unsigned)
 Copied from CRM #[id number]. Topic: Clinical - Prescription Issue >> Dec 08, 2024  9:50 AM Wess RAMAN wrote: Reason for CRM: Patient would like a call from Vicci Bouchard, DO or her nurse to discuss her prescripitons She stated they are too expensive. She would like the generic or cheaper version of the following:  atorvastatin  (LIPITOR) 80 MG tablet  metoprolol succinate (TOPROL-XL) 25 MG 24 hr tablet  Lisinopril   montelukast  (SINGULAIR ) 10 MG tablet  Fluticasone -Umeclidin-Vilant (TRELEGY ELLIPTA ) 100-62.5-25 MCG/ACT AEPB   Pharmacy: [facility] [phone] [place] [address] [place] [address] Phone: [phone] Fax: [phone] Hours: Not open 24 hours   Callback #: [phone]

## 2024-12-09 NOTE — Telephone Encounter (Signed)
 All of those medicines except Trelegy are generic. How much are they charging her?

## 2024-12-09 NOTE — Telephone Encounter (Signed)
 Dr. Vicci, do you know of anyway the patient can get medications cheaper? Maybe switching to [facility]? Or can the pharmacy team possibly do anything?

## 2024-12-09 NOTE — Telephone Encounter (Signed)
 Called and spoke with patient. She states that she was able to figure this out. States she spoke with her pharmacy and she is using a discount card with them.

## 2025-02-06 ENCOUNTER — Ambulatory Visit: Admitting: Family Medicine

## 2025-04-07 ENCOUNTER — Ambulatory Visit
